# Patient Record
Sex: Female | Born: 1950 | Race: White | Hispanic: No | Marital: Married | Smoking: Former smoker
Health system: Southern US, Community
[De-identification: ages and names within clinical notes are randomized; demographics above are authoritative.]

## PROBLEM LIST (undated history)

## (undated) DIAGNOSIS — E213 Hyperparathyroidism, unspecified: Secondary | ICD-10-CM

## (undated) DIAGNOSIS — M1990 Unspecified osteoarthritis, unspecified site: Secondary | ICD-10-CM

## (undated) DIAGNOSIS — K219 Gastro-esophageal reflux disease without esophagitis: Secondary | ICD-10-CM

## (undated) DIAGNOSIS — I10 Essential (primary) hypertension: Secondary | ICD-10-CM

## (undated) DIAGNOSIS — R61 Generalized hyperhidrosis: Secondary | ICD-10-CM

## (undated) DIAGNOSIS — E119 Type 2 diabetes mellitus without complications: Secondary | ICD-10-CM

## (undated) DIAGNOSIS — Z8719 Personal history of other diseases of the digestive system: Secondary | ICD-10-CM

## (undated) DIAGNOSIS — C801 Malignant (primary) neoplasm, unspecified: Secondary | ICD-10-CM

## (undated) DIAGNOSIS — E7800 Pure hypercholesterolemia, unspecified: Secondary | ICD-10-CM

## (undated) DIAGNOSIS — N189 Chronic kidney disease, unspecified: Secondary | ICD-10-CM

## (undated) DIAGNOSIS — F32A Depression, unspecified: Secondary | ICD-10-CM

## (undated) DIAGNOSIS — F329 Major depressive disorder, single episode, unspecified: Secondary | ICD-10-CM

## (undated) HISTORY — DX: Depression, unspecified: F32.A

## (undated) HISTORY — PX: MELANOMA EXCISION: SHX5266

## (undated) HISTORY — PX: JOINT REPLACEMENT: SHX530

## (undated) HISTORY — DX: Generalized hyperhidrosis: R61

## (undated) HISTORY — DX: Gastro-esophageal reflux disease without esophagitis: K21.9

## (undated) HISTORY — DX: Hyperparathyroidism, unspecified: E21.3

## (undated) HISTORY — DX: Pure hypercholesterolemia, unspecified: E78.00

## (undated) HISTORY — DX: Type 2 diabetes mellitus without complications: E11.9

## (undated) HISTORY — PX: BREAST EXCISIONAL BIOPSY: SUR124

## (undated) HISTORY — DX: Essential (primary) hypertension: I10

## (undated) HISTORY — DX: Major depressive disorder, single episode, unspecified: F32.9

## (undated) HISTORY — DX: Malignant (primary) neoplasm, unspecified: C80.1

---
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
## 1975-05-10 HISTORY — PX: CARPAL TUNNEL RELEASE: SHX101

## 1977-05-09 HISTORY — PX: ABDOMINAL HYSTERECTOMY: SHX81

## 2005-05-09 HISTORY — PX: CARPAL TUNNEL RELEASE: SHX101

## 2006-05-09 HISTORY — PX: TOTAL KNEE ARTHROPLASTY: SHX125

## 2007-05-10 HISTORY — PX: COLONOSCOPY: SHX174

## 2007-05-10 LAB — HM COLONOSCOPY

## 2010-05-09 HISTORY — PX: TOTAL KNEE ARTHROPLASTY: SHX125

## 2010-05-09 LAB — HM MAMMOGRAPHY

## 2011-05-10 LAB — HM PAP SMEAR

## 2013-04-09 ENCOUNTER — Ambulatory Visit (INDEPENDENT_AMBULATORY_CARE_PROVIDER_SITE_OTHER): Payer: 59 | Admitting: Internal Medicine

## 2013-04-09 ENCOUNTER — Encounter: Payer: Self-pay | Admitting: Internal Medicine

## 2013-04-09 VITALS — BP 136/88 | HR 81 | Temp 98.3°F | Resp 12 | Ht 64.5 in | Wt 209.0 lb

## 2013-04-09 DIAGNOSIS — F418 Other specified anxiety disorders: Secondary | ICD-10-CM | POA: Insufficient documentation

## 2013-04-09 DIAGNOSIS — Z23 Encounter for immunization: Secondary | ICD-10-CM

## 2013-04-09 DIAGNOSIS — E119 Type 2 diabetes mellitus without complications: Secondary | ICD-10-CM | POA: Insufficient documentation

## 2013-04-09 DIAGNOSIS — E785 Hyperlipidemia, unspecified: Secondary | ICD-10-CM | POA: Insufficient documentation

## 2013-04-09 DIAGNOSIS — E1122 Type 2 diabetes mellitus with diabetic chronic kidney disease: Secondary | ICD-10-CM | POA: Insufficient documentation

## 2013-04-09 DIAGNOSIS — F329 Major depressive disorder, single episode, unspecified: Secondary | ICD-10-CM

## 2013-04-09 DIAGNOSIS — E559 Vitamin D deficiency, unspecified: Secondary | ICD-10-CM | POA: Insufficient documentation

## 2013-04-09 DIAGNOSIS — K219 Gastro-esophageal reflux disease without esophagitis: Secondary | ICD-10-CM

## 2013-04-09 MED ORDER — PAROXETINE HCL 30 MG PO TABS: 30.0000 mg | ORAL_TABLET | Freq: Every day | ORAL | Status: DC

## 2013-04-09 MED ORDER — SIMVASTATIN 20 MG PO TABS: 20.0000 mg | ORAL_TABLET | Freq: Every day | ORAL | Status: DC

## 2013-04-09 NOTE — Progress Notes (Signed)
Patient ID: Jill Garza, female   DOB: July 05, 1950, 62 y.o.   MRN: [id number]  Chief Complaint  Patient presents with  . Establish Care    New patient establis care, not fasting today   . Medication Refill    Renew medications    Allergies  Allergen Reactions  . Penicillins     Rash   . Ivp Dye [Iodinated Diagnostic Agents]     Hives   . Percocet [Oxycodone-Acetaminophen]     Itching     HPI 62 y/o female patient is here to establish care. She moved from [place] in July 2014. She has history of hypertension since 1997 and is tolerating hyzaar well for several years. She has DM since 2007 and has been on metformin 500 mg daily. a1c < 7 in may 2014 Has history of hiatal hernia and is on prilosec 20 mg daily. Has hx of gastric ulcers which were noted in EGD and have healed Has been on paxil 30 mg daily and her mood has been stable Has been tolerating zocor well uptodate with eye exam including retinal exam uptodate with influenza vaccine and herpes zoster vaccine Has not had bone scan and mammogram in past  Review of Systems  Constitutional: Negative for fever, chills, weight loss, malaise/fatigue and diaphoresis.  HENT: Negative for congestion, hearing loss and sore throat.   Eyes: Negative for blurred vision, double vision and discharge. wears corrective lenses Respiratory: Negative for cough, sputum production, shortness of breath and wheezing.   Cardiovascular: Negative for chest pain, palpitations, orthopnea and leg swelling.  Gastrointestinal: Negative for nausea, vomiting, abdominal pain, diarrhea and constipation.  Genitourinary: Negative for dysuria, urgency, frequency and flank pain.  Musculoskeletal: Negative for back pain, falls, joint pain and myalgias.  Skin: Negative for itching and rash.  Neurological: Negative for dizziness, tingling, focal weakness and headaches.  Psychiatric/Behavioral: Negative for depression and memory loss. The patient is not  nervous/anxious.    Past Medical History  Diagnosis Date  . Hypertension   . Type 2 diabetes mellitus   . Hypercholesterolemia   . GERD (gastroesophageal reflux disease)   . Depression    Past Surgical History  Procedure Laterality Date  . Total knee arthroplasty Left 2012  . Total knee arthroplasty Right 2008  . Abdominal hysterectomy  1979  . Appendectomy  1964  . Tonsillectomy  1962  . Carpal tunnel release Right 1977  . Carpal tunnel release Left 2007  . Colonoscopy  2009   Family History  Problem Relation Age of Onset  . Heart attack Father   . Pulmonary embolism Mother     Multiple, B/L   . Hypertension Brother   . Heart disease Brother   . Diabetes Brother   . High Cholesterol Brother   . Hypertension Brother   . High Cholesterol Brother   . Hypertension Sister   . Diabetes type II Sister   . High Cholesterol Sister   . Alcohol abuse Brother   . Hepatitis Brother   . Reye's syndrome Daughter   . Stroke    . Cancer Maternal Grandmother 66   History   Social History Narrative  . No narrative on file   Physical exam BP 136/88  Pulse 81  Temp(Src) 98.3 F (36.8 C) (Oral)  Resp 12  Ht 5' 4.5" (1.638 m)  Wt 209 lb (94.802 kg)  BMI 35.33 kg/m2  SpO2 98%  General- elderly female in no acute distress Head- atraumatic, normocephalic Eyes- PERRLA, EOMI, no pallor, no  icterus, no discharge Neck- no lymphadenopathy, no thyromegaly, no jugular vein distension, no carotid bruit Chest- no chest wall deformities, no chest wall tenderness Cardiovascular- normal s1,s2, no murmurs/ rubs/ gallops Respiratory- bilateral clear to auscultation, no wheeze, no rhonchi, no crackles Abdomen- bowel sounds present, soft, non tender Musculoskeletal- able to move all 4 extremities, no spinal and paraspinal tenderness, steady gait, no use of assistive device Neurological- no focal deficit, normal reflexes, normal muscle strength Psychiatry- alert and oriented to person, place  and time, normal mood and affect   Labs-  10/02/12 vit d 46, t.chol 200, tg 189, hdl 50, ldl 112, a1c 6.4, na 136, k 3.9, co2 27, cr 0.98, alb 4.3, t.bil 0.7, alp 60, ast 25, alt 21, gfr 57, wbc 6.4, hb 14, hct 43, mcv 87, plt 286, tsh 0.84  Assessment/plan  Dm type 2- will check her a1c. Continue metformin for now and check urine microalbumin. Continue  Losartan and zocor. uptodate with eye exam. counselling on dietary intake, meal planning- portion and calorie counting education along with exercise plan formulated for patient. Pneumococcal vaccine to be provided. uptodate with influenza vaccine  HTN- bp well controlled. Continue hyzaar for now, monitor bp , salt restriction  GERD- symptoms under control, continue her prilosec for now  Depression- mood stable on paxil, continue this and monitor  hyperlipidemia- continue zocor, dietary counselling provided  Vitamin d def- continue vitamin d supplement  Labs- a1c, urine microalbumin, mammogram, dexa scan

## 2013-04-10 ENCOUNTER — Encounter: Payer: Self-pay | Admitting: *Deleted

## 2013-04-10 LAB — MICROALBUMIN / CREATININE URINE RATIO
Creatinine, Ur: 101.8 mg/dL (ref 15.0–278.0)
MICROALB/CREAT RATIO: 5.1 mg/g creat (ref 0.0–30.0)

## 2013-04-10 LAB — HEMOGLOBIN A1C
Est. average glucose Bld gHb Est-mCnc: 140 mg/dL
Hgb A1c MFr Bld: 6.5 % — ABNORMAL HIGH (ref 4.8–5.6)

## 2013-04-23 ENCOUNTER — Other Ambulatory Visit: Payer: Self-pay | Admitting: *Deleted

## 2013-04-23 MED ORDER — LOSARTAN POTASSIUM-HCTZ 100-12.5 MG PO TABS: 1.0000 | ORAL_TABLET | Freq: Every day | ORAL | Status: DC

## 2013-05-16 ENCOUNTER — Ambulatory Visit
Admission: RE | Admit: 2013-05-16 | Discharge: 2013-05-16 | Disposition: A | Discharge status: Home / Self Care | Payer: 59 | Source: Ambulatory Visit | Attending: Internal Medicine | Admitting: Internal Medicine

## 2013-05-16 DIAGNOSIS — E559 Vitamin D deficiency, unspecified: Secondary | ICD-10-CM

## 2013-05-16 DIAGNOSIS — E119 Type 2 diabetes mellitus without complications: Secondary | ICD-10-CM

## 2013-05-21 ENCOUNTER — Other Ambulatory Visit: Payer: Self-pay | Admitting: Internal Medicine

## 2013-05-21 DIAGNOSIS — R928 Other abnormal and inconclusive findings on diagnostic imaging of breast: Secondary | ICD-10-CM

## 2013-05-24 ENCOUNTER — Other Ambulatory Visit: Payer: Self-pay | Admitting: Internal Medicine

## 2013-05-24 ENCOUNTER — Other Ambulatory Visit: Payer: Self-pay

## 2013-05-24 DIAGNOSIS — R928 Other abnormal and inconclusive findings on diagnostic imaging of breast: Secondary | ICD-10-CM

## 2013-05-30 ENCOUNTER — Ambulatory Visit
Admission: RE | Admit: 2013-05-30 | Discharge: 2013-05-30 | Disposition: A | Discharge status: Home / Self Care | Payer: 59 | Source: Ambulatory Visit | Attending: Internal Medicine | Admitting: Internal Medicine

## 2013-05-30 DIAGNOSIS — R928 Other abnormal and inconclusive findings on diagnostic imaging of breast: Secondary | ICD-10-CM

## 2013-07-09 ENCOUNTER — Ambulatory Visit: Payer: 59 | Admitting: Internal Medicine

## 2013-07-16 ENCOUNTER — Encounter: Payer: Self-pay | Admitting: Internal Medicine

## 2013-07-16 ENCOUNTER — Ambulatory Visit (INDEPENDENT_AMBULATORY_CARE_PROVIDER_SITE_OTHER): Payer: 59 | Admitting: Internal Medicine

## 2013-07-16 VITALS — BP 118/72 | HR 84 | Temp 97.9°F | Wt 213.4 lb

## 2013-07-16 DIAGNOSIS — F32A Depression, unspecified: Secondary | ICD-10-CM

## 2013-07-16 DIAGNOSIS — I10 Essential (primary) hypertension: Secondary | ICD-10-CM | POA: Insufficient documentation

## 2013-07-16 DIAGNOSIS — E559 Vitamin D deficiency, unspecified: Secondary | ICD-10-CM

## 2013-07-16 DIAGNOSIS — K219 Gastro-esophageal reflux disease without esophagitis: Secondary | ICD-10-CM

## 2013-07-16 DIAGNOSIS — F329 Major depressive disorder, single episode, unspecified: Secondary | ICD-10-CM

## 2013-07-16 DIAGNOSIS — E119 Type 2 diabetes mellitus without complications: Secondary | ICD-10-CM

## 2013-07-16 DIAGNOSIS — M899 Disorder of bone, unspecified: Secondary | ICD-10-CM

## 2013-07-16 DIAGNOSIS — M949 Disorder of cartilage, unspecified: Secondary | ICD-10-CM

## 2013-07-16 DIAGNOSIS — F3289 Other specified depressive episodes: Secondary | ICD-10-CM

## 2013-07-16 DIAGNOSIS — M8580 Other specified disorders of bone density and structure, unspecified site: Secondary | ICD-10-CM

## 2013-07-16 DIAGNOSIS — E785 Hyperlipidemia, unspecified: Secondary | ICD-10-CM

## 2013-07-16 MED ORDER — PAROXETINE HCL 30 MG PO TABS: 30.0000 mg | ORAL_TABLET | Freq: Every day | ORAL | Status: DC

## 2013-07-16 NOTE — Progress Notes (Signed)
Patient ID: Jill Garza, female   DOB: 1950/12/24, 62 y.o.   MRN: [id number]     Chief Complaint  Patient presents with  . Medical Managment of Chronic Issues    3 month f/u    Allergies  Allergen Reactions  . Penicillins     Rash   . Ivp Dye [Iodinated Diagnostic Agents]     Hives   . Percocet [Oxycodone-Acetaminophen]     Itching     HPI 63 y/o female patient is here for her follow up. She has history of HTN, DM, hyperlipidemia. Complaint with her medications. Has history of hiatal hernia and gastric ulcers and is on prilosec 20 mg daily. mood has been stable uptodate with eye exam including retinal exam uptodate with influenza vaccine and herpes zoster vaccine uptodate with mammogram: normal and dexa scan: osteopenia. Reviewed  Review of Systems  Constitutional: Negative for fever, chills, weight loss, malaise/fatigue and diaphoresis.  HENT: Negative for congestion, hearing loss and sore throat.   Eyes: Negative for blurred vision, double vision and discharge. wears corrective lenses Respiratory: Negative for cough, sputum production, shortness of breath and wheezing.   Cardiovascular: Negative for chest pain, palpitations, orthopnea and leg swelling.  Gastrointestinal: Negative for nausea, vomiting, abdominal pain, diarrhea and constipation.  Genitourinary: Negative for dysuria, urgency, frequency and flank pain.  Musculoskeletal: Negative for back pain, falls, joint pain and myalgias.  Skin: Negative for itching and rash.  Neurological: Negative for dizziness, tingling, focal weakness and headaches.  Psychiatric/Behavioral: Negative for depression and memory loss. The patient is not nervous/anxious.      Past Medical History  Diagnosis Date  . Hypertension   . Type 2 diabetes mellitus   . Hypercholesterolemia   . GERD (gastroesophageal reflux disease)   . Depression    Past Surgical History  Procedure Laterality Date  . Total knee arthroplasty Left 2012  . Total  knee arthroplasty Right 2008  . Abdominal hysterectomy  1979  . Appendectomy  1964  . Tonsillectomy  1962  . Carpal tunnel release Right 1977  . Carpal tunnel release Left 2007  . Colonoscopy  2009   Current Outpatient Prescriptions on File Prior to Visit  Medication Sig Dispense Refill  . Biotin 300 MCG TABS Take by mouth daily.      . Cholecalciferol (VITAMIN D-3) 1000 UNITS CAPS Take by mouth daily.      Marland Kitchen CLINDAMYCIN HCL PO Take 1 g by mouth. Prior to dental work      . cyclobenzaprine (FLEXERIL) 10 MG tablet Take 10 mg by mouth every 8 (eight) hours as needed for muscle spasms.      Marland Kitchen losartan-hydrochlorothiazide (HYZAAR) 100-12.5 MG per tablet Take 1 tablet by mouth daily.  90 tablet  1  . metFORMIN (GLUCOPHAGE) 500 MG tablet Take 500 mg by mouth daily with breakfast.      . Multiple Vitamin (MULTIVITAMIN) tablet Take 1 tablet by mouth daily.      Marland Kitchen omeprazole (PRILOSEC) 20 MG capsule Take 20 mg by mouth daily.      . simvastatin (ZOCOR) 20 MG tablet Take 1 tablet (20 mg total) by mouth daily.  90 tablet  3   No current facility-administered medications on file prior to visit.    Physical exam BP 118/72  Pulse 84  Temp(Src) 97.9 F (36.6 C) (Oral)  Wt 213 lb 6.4 oz (96.798 kg)  SpO2 97%  General- elderly female in no acute distress Head- atraumatic, normocephalic Eyes- PERRLA, EOMI, no pallor,  no icterus, no discharge Neck- no lymphadenopathy, no thyromegaly, no jugular vein distension, no carotid bruit Chest- no chest wall deformities, no chest wall tenderness Cardiovascular- normal s1,s2, no murmurs/ rubs/ gallops Respiratory- bilateral clear to auscultation, no wheeze, no rhonchi, no crackles Abdomen- bowel sounds present, soft, non tender Musculoskeletal- able to move all 4 extremities, no spinal and paraspinal tenderness, steady gait, no use of assistive device Neurological- no focal deficit, normal reflexes, normal muscle strength Psychiatry- alert and oriented to  person, place and time, normal mood and affect   Labs-   10/02/12 vit d 46, t.chol 200, tg 189, hdl 50, ldl 112, a1c 6.4, na 136, k 3.9, co2 27, cr 0.98, alb 4.3, t.bil 0.7, alp 60, ast 25, alt 21, gfr 57, wbc 6.4, hb 14, hct 43, mcv 87, plt 286, tsh 0.84 Lab Results  Component Value Date   HGBA1C 6.5* 04/09/2013   Normal urine microalbumin  Assessment/plan  1. Depression Stable. Continue paroxetine. Refill provided - PARoxetine (PAXIL) 30 MG tablet; Take 1 tablet (30 mg total) by mouth daily.  Dispense: 90 tablet; Refill: 1  2. DM type 2 goal A1C below 7.5 Continue metformin for now. uptodate with eye and foot exam. Normal urine microalbumin. Continue  Losartan and zocor. uptodate with influenza vaccine and pneumococcal vaccine - Hemoglobin A1c - Hemoglobin A1c; Future  3. Hyperlipidemia LDL goal < 100 Continue zocor - Lipid Panel; Future  4. Unspecified vitamin D deficiency continue vitamin d supplement  5. GERD (gastroesophageal reflux disease) symptoms under control, continue her prilosec for now  6. Osteopenia Reviewed dexa scan. Started on ca-vit d. Check vit d level. Encouraged weight bearing exercises - Vitamin D, 1,25-dihydroxy; Future  7. Essential hypertension, benign Continue hyzaar for now, monitor bp , salt restriction, dietary counselling and exercise encouraged - CMP; Future - CBC with Differential; Future

## 2013-07-17 LAB — HEMOGLOBIN A1C
Est. average glucose Bld gHb Est-mCnc: 160 mg/dL
Hgb A1c MFr Bld: 7.2 % — ABNORMAL HIGH (ref 4.8–5.6)

## 2013-10-22 ENCOUNTER — Encounter: Payer: Self-pay | Admitting: Internal Medicine

## 2013-10-22 ENCOUNTER — Ambulatory Visit (INDEPENDENT_AMBULATORY_CARE_PROVIDER_SITE_OTHER): Payer: 59 | Admitting: Internal Medicine

## 2013-10-22 VITALS — BP 110/80 | HR 74 | Temp 98.2°F | Resp 18 | Ht 64.5 in | Wt 208.2 lb

## 2013-10-22 DIAGNOSIS — D239 Other benign neoplasm of skin, unspecified: Secondary | ICD-10-CM

## 2013-10-22 DIAGNOSIS — Z85820 Personal history of malignant melanoma of skin: Secondary | ICD-10-CM

## 2013-10-22 DIAGNOSIS — D229 Melanocytic nevi, unspecified: Secondary | ICD-10-CM

## 2013-10-22 NOTE — Progress Notes (Signed)
Patient ID: Jill Garza, female   DOB: 04-28-1951, 63 y.o.   MRN: [id number]    Chief Complaint  Patient presents with  . Acute Visit    new moles   Allergies  Allergen Reactions  . Penicillins     Rash   . Ivp Dye [Iodinated Diagnostic Agents]     Hives   . Percocet [Oxycodone-Acetaminophen]     Itching     HPI 63 y/o pt here with her husband. She has history of melanoma and she has noticed a new mole with change in shape and texture and color over last week. She has also noticed another mole coming up. Denies any itching or pain. Uses sunscree and has been out in the sun lately  ROS Personal hx of melanoma which was removed from her leg  Past Medical History  Diagnosis Date  . Hypertension   . Type 2 diabetes mellitus   . Hypercholesterolemia   . GERD (gastroesophageal reflux disease)   . Depression    Medication reviewed. [facility]  Physical exam BP 110/80  Pulse 74  Temp(Src) 98.2 F (36.8 C) (Oral)  Resp 18  Ht 5' 4.5" (1.638 m)  Wt 208 lb 3.2 oz (94.439 kg)  BMI 35.20 kg/m2  SpO2 97%  General- elderly female in no acute distress Head- atraumatic, normocephalic Neck- no lymphadenopathy Cardiovascular- normal s1,s2, no murmurs/ rubs/ gallops Respiratory- bilateral clear to auscultation, no wheeze, no rhonchi, no crackles Skin- several red and brown and [place] mole on her back. 3 new red mole which has changed in size compared to the picture she has and one is bluish purple in color, increased in size and has irregular margin  Assessment/plan  1. History of malignant melanoma of skin See below - Ambulatory referral to Dermatology  2. Numerous moles With her history of melanoma, will refer her to dermatology to help assess further for need of tissue biopsy. Advised on having sunscreen when outdoor for now - Ambulatory referral to Dermatology

## 2013-10-29 ENCOUNTER — Other Ambulatory Visit: Payer: Self-pay | Admitting: Internal Medicine

## 2013-12-13 ENCOUNTER — Other Ambulatory Visit: Payer: 59

## 2013-12-13 DIAGNOSIS — E119 Type 2 diabetes mellitus without complications: Secondary | ICD-10-CM

## 2013-12-13 DIAGNOSIS — M8580 Other specified disorders of bone density and structure, unspecified site: Secondary | ICD-10-CM

## 2013-12-13 DIAGNOSIS — E785 Hyperlipidemia, unspecified: Secondary | ICD-10-CM

## 2013-12-13 DIAGNOSIS — I10 Essential (primary) hypertension: Secondary | ICD-10-CM

## 2013-12-16 LAB — CBC WITH DIFFERENTIAL/PLATELET
BASOS: 1 %
Basophils Absolute: 0 10*3/uL (ref 0.0–0.2)
EOS ABS: 0.1 10*3/uL (ref 0.0–0.4)
Eos: 1 %
HCT: 40.1 % (ref 34.0–46.6)
HEMOGLOBIN: 13.6 g/dL (ref 11.1–15.9)
IMMATURE GRANS (ABS): 0 10*3/uL (ref 0.0–0.1)
Immature Granulocytes: 0 %
LYMPHS: 46 %
Lymphocytes Absolute: 2.1 10*3/uL (ref 0.7–3.1)
MCH: 29.1 pg (ref 26.6–33.0)
MCHC: 33.9 g/dL (ref 31.5–35.7)
MCV: 86 fL (ref 79–97)
MONOS ABS: 0.3 10*3/uL (ref 0.1–0.9)
Monocytes: 6 %
NEUTROS ABS: 2.1 10*3/uL (ref 1.4–7.0)
Neutrophils Relative %: 46 %
RBC: 4.67 x10E6/uL (ref 3.77–5.28)
RDW: 12.9 % (ref 12.3–15.4)
WBC: 4.6 10*3/uL (ref 3.4–10.8)

## 2013-12-16 LAB — COMPREHENSIVE METABOLIC PANEL
A/G RATIO: 1.7 (ref 1.1–2.5)
ALK PHOS: 66 IU/L (ref 39–117)
ALT: 13 IU/L (ref 0–32)
AST: 17 IU/L (ref 0–40)
Albumin: 4.5 g/dL (ref 3.6–4.8)
BUN / CREAT RATIO: 17 (ref 11–26)
BUN: 16 mg/dL (ref 8–27)
CHLORIDE: 97 mmol/L (ref 97–108)
CO2: 25 mmol/L (ref 18–29)
Calcium: 10.3 mg/dL (ref 8.7–10.3)
Creatinine, Ser: 0.95 mg/dL (ref 0.57–1.00)
GFR calc Af Amer: 73 mL/min/{1.73_m2} (ref >59)
GFR calc non Af Amer: 63 mL/min/{1.73_m2} (ref >59)
Globulin, Total: 2.7 g/dL (ref 1.5–4.5)
Glucose: 105 mg/dL — ABNORMAL HIGH (ref 65–99)
Potassium: 4.2 mmol/L (ref 3.5–5.2)
SODIUM: 136 mmol/L (ref 134–144)
Total Bilirubin: 0.4 mg/dL (ref 0.0–1.2)
Total Protein: 7.2 g/dL (ref 6.0–8.5)

## 2013-12-16 LAB — LIPID PANEL
CHOLESTEROL TOTAL: 136 mg/dL (ref 100–199)
Chol/HDL Ratio: 3 ratio units (ref 0.0–4.4)
HDL: 46 mg/dL (ref >39)
LDL Calculated: 60 mg/dL (ref 0–99)
Triglycerides: 149 mg/dL (ref 0–149)
VLDL Cholesterol Cal: 30 mg/dL (ref 5–40)

## 2013-12-16 LAB — HEMOGLOBIN A1C
Est. average glucose Bld gHb Est-mCnc: 140 mg/dL
HEMOGLOBIN A1C: 6.5 % — AB (ref 4.8–5.6)

## 2013-12-16 LAB — VITAMIN D 1,25 DIHYDROXY: VIT D 1 25 DIHYDROXY: 48.2 pg/mL (ref 19.9–79.3)

## 2013-12-17 ENCOUNTER — Encounter: Payer: Self-pay | Admitting: Internal Medicine

## 2013-12-17 ENCOUNTER — Ambulatory Visit (INDEPENDENT_AMBULATORY_CARE_PROVIDER_SITE_OTHER): Payer: 59 | Admitting: Internal Medicine

## 2013-12-17 VITALS — BP 120/78 | HR 74 | Temp 98.0°F | Ht 64.5 in | Wt 207.8 lb

## 2013-12-17 DIAGNOSIS — F3289 Other specified depressive episodes: Secondary | ICD-10-CM

## 2013-12-17 DIAGNOSIS — K635 Polyp of colon: Secondary | ICD-10-CM

## 2013-12-17 DIAGNOSIS — F32A Depression, unspecified: Secondary | ICD-10-CM

## 2013-12-17 DIAGNOSIS — E559 Vitamin D deficiency, unspecified: Secondary | ICD-10-CM

## 2013-12-17 DIAGNOSIS — R079 Chest pain, unspecified: Secondary | ICD-10-CM | POA: Insufficient documentation

## 2013-12-17 DIAGNOSIS — K219 Gastro-esophageal reflux disease without esophagitis: Secondary | ICD-10-CM

## 2013-12-17 DIAGNOSIS — D126 Benign neoplasm of colon, unspecified: Secondary | ICD-10-CM

## 2013-12-17 DIAGNOSIS — Z23 Encounter for immunization: Secondary | ICD-10-CM

## 2013-12-17 DIAGNOSIS — F329 Major depressive disorder, single episode, unspecified: Secondary | ICD-10-CM

## 2013-12-17 DIAGNOSIS — I10 Essential (primary) hypertension: Secondary | ICD-10-CM

## 2013-12-17 DIAGNOSIS — M949 Disorder of cartilage, unspecified: Secondary | ICD-10-CM

## 2013-12-17 DIAGNOSIS — E785 Hyperlipidemia, unspecified: Secondary | ICD-10-CM

## 2013-12-17 DIAGNOSIS — Z0000 Encounter for general adult medical examination without abnormal findings: Secondary | ICD-10-CM | POA: Insufficient documentation

## 2013-12-17 DIAGNOSIS — M8580 Other specified disorders of bone density and structure, unspecified site: Secondary | ICD-10-CM

## 2013-12-17 DIAGNOSIS — M899 Disorder of bone, unspecified: Secondary | ICD-10-CM

## 2013-12-17 DIAGNOSIS — E119 Type 2 diabetes mellitus without complications: Secondary | ICD-10-CM

## 2013-12-17 LAB — SPECIMEN STATUS REPORT

## 2013-12-17 MED ORDER — ASPIRIN EC 81 MG PO TBEC: 81.0000 mg | DELAYED_RELEASE_TABLET | Freq: Every day | ORAL | Status: DC

## 2013-12-17 NOTE — Progress Notes (Signed)
Patient ID: Jill Garza, female   DOB: 11-12-1950, 63 y.o.   MRN: [id number]    Chief Complaint  Patient presents with  . Annual Exam    Physical with labs (printed)  . other    no refills needed   Allergies  Allergen Reactions  . Penicillins     Rash   . Ivp Dye [Iodinated Diagnostic Agents]     Hives   . Percocet [Oxycodone-Acetaminophen]     Itching    HPI  63 y/o female pt here for her annual exam. She has history of HTN, DM, HL among others. She has a sedentary lifestyle, does not exercise and denies any complaints this visit. 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 She feels hot and has been sweating more  Wt Readings from Last 3 Encounters:  12/17/13 207 lb 12.8 oz (94.257 kg)  10/22/13 208 lb 3.2 oz (94.439 kg)  07/16/13 213 lb 6.4 oz (96.798 kg)   Review of Systems  Constitutional: Negative for fever, chills, weight loss, malaise/fatigue  HENT: Negative for congestion, hearing loss and sore throat.   Eyes: Negative for blurred vision, double vision and discharge. uptodate with eye exam, next due in nov 2015 Respiratory: Negative for cough, sputum production, shortness of breath and wheezing.   Cardiovascular: Negative for palpitations, orthopnea and leg swelling. has been having chest pain lasting for few seconds on left side of her sternum with exertion mostly and occasionally with rest. Denies radiation of the pain and also mentions having chest tightness with these episodes. Gastrointestinal: Negative for heartburn, nausea, vomiting, abdominal pain, diarrhea and constipation. reflux under control. Had colonoscopy in 2008 and had polyps removed. Due for another one in 5 years but lost to follow up. Denies melena or rectal bleed Genitourinary: Negative for dysuria, urgency, frequency and flank pain. s/p hysterectomy Musculoskeletal: Negative for back pain, falls, joint pain and myalgias. s/p both  TKA Skin: Negative for itching and rash.  Neurological:  Negative for dizziness, tingling, focal weakness and headaches.  Psychiatric/Behavioral: Negative for memory loss. The patient is not nervous/anxious.  has history of depression and is on medication  Past Medical History  Diagnosis Date  . Hypertension   . Type 2 diabetes mellitus   . Hypercholesterolemia   . GERD (gastroesophageal reflux disease)   . Depression   . Sweat, sweating, excessive    Past Surgical History  Procedure Laterality Date  . Total knee arthroplasty Left 2012  . Total knee arthroplasty Right 2008  . Abdominal hysterectomy  1979  . Appendectomy  1964  . Tonsillectomy  1962  . Carpal tunnel release Right 1977  . Carpal tunnel release Left 2007  . Colonoscopy  2009   Current Outpatient Prescriptions on File Prior to Visit  Medication Sig Dispense Refill  . Biotin 300 MCG TABS Take by mouth daily.      . Calcium Carb-Cholecalciferol (CALCIUM 600 + D PO) Take 1 tablet by mouth 2 (two) times daily.      . Cholecalciferol (VITAMIN D-3) 1000 UNITS CAPS Take by mouth daily.      Marland Kitchen CLINDAMYCIN HCL PO Take 1 g by mouth. Prior to dental work      . cyclobenzaprine (FLEXERIL) 10 MG tablet Take 10 mg by mouth every 8 (eight) hours as needed for muscle spasms.      Marland Kitchen losartan-hydrochlorothiazide (HYZAAR) 100-12.5 MG per tablet TAKE 1 TABLET BY MOUTH DAILY.  [place]  tablet  1  . metFORMIN (GLUCOPHAGE) 500 MG tablet Take 500 mg by mouth daily with breakfast.      . Multiple Vitamin (MULTIVITAMIN) tablet Take 1 tablet by mouth daily.      Marland Kitchen omeprazole (PRILOSEC) 20 MG capsule Take 20 mg by mouth daily.      Marland Kitchen PARoxetine (PAXIL) 30 MG tablet Take 1 tablet (30 mg total) by mouth daily.  90 tablet  1  . simvastatin (ZOCOR) 20 MG tablet Take 1 tablet (20 mg total) by mouth daily.  90 tablet  3   No current facility-administered medications on file prior to visit.   Family History  Problem Relation Age of Onset  . Heart  attack Father   . Pulmonary embolism Mother     Multiple, B/L   . Hypertension Brother   . Heart disease Brother   . Diabetes Brother   . High Cholesterol Brother   . Hypertension Brother   . High Cholesterol Brother   . Hypertension Sister   . Diabetes type II Sister   . High Cholesterol Sister   . Alcohol abuse Brother   . Hepatitis Brother   . Reye's syndrome Daughter   . Stroke    . Cancer Maternal Grandmother 99   History   Social History  . Marital Status: Married    Spouse Name: N/A    Number of Children: N/A  . Years of Education: N/A   Occupational History  . Not on file.   Social History Main Topics  . Smoking status: Former Smoker -- 15 years    Quit date: 05/09/1986  . Smokeless tobacco: Not on file  . Alcohol Use: No  . Drug Use: No  . Sexual Activity: Not on file   Other Topics Concern  . Not on file   Social History Narrative  . No narrative on file   Physical exam BP 120/78  Pulse 74  Temp(Src) 98 F (36.7 C) (Oral)  Ht 5' 4.5" (1.638 m)  Wt 207 lb 12.8 oz (94.257 kg)  BMI 35.13 kg/m2  SpO2 98%  General- adult female in no acute distress, well nourished and well developed Head- atraumatic, normocephalic Eyes- PERRLA, EOMI, no pallor, no icterus, no discharge Neck- supple, no lymphadenopathy, no thyromegaly, no jugular vein distension, no carotid bruit Ears- left ear normal tympanic membrane and normal external ear canal , right ear normal tympanic membrane and normal external ear canal oropharynx- moist mucus membrane, normal oropharynx, normal dentition, no buccal or palatal lesions, posterior pharynx clear Nose- normal nasal mucosa, no maxillary or frontal sinus tenderness Chest- no chest wall deformities, no chest wall tenderness Breast- normal skin, nipples without discharge, no palpable mass or lesion, no axillary lymphadenoapthy Cardiovascular- normal s1,s2, no murmurs/ rubs/ gallops, good radial and dorsalis pedis  pulses Respiratory- bilateral clear to auscultation, no wheeze, no rhonchi, no crackles, no use of accessory muscles Abdomen- bowel sounds present, soft, non tender, no organomegaly, no abdominal bruits, no guarding or rigidity, no CVA tenderness Pelvic exam- not done, pt being referred to Gyn Musculoskeletal- able to move all 4 extremities, no spinal and paraspinal tenderness, steady gait, no use of assistive device, normal range of motion, no leg edema Neurological- no focal deficit, cranial nerve II-XII normal, normal motor strength, normal deep tendon reflexes 2+ and symmetrical biceps and patellar, normal sensation to fine touch and vibration, no tremor, no rigidity, alert and oriented to person, place and time Skin- warm and dry Psychiatry- normal mood and  affect  imaging 12/17/13 EKG- normal sinus rhythm, normal QTc, normal PR interval and QRS complex   Labs Lab Results  Component Value Date   HGBA1C 6.5* 12/13/2013    Lipid Panel     Component Value Date/Time   TRIG 149 12/13/2013 0835   HDL 46 12/13/2013 0835   CHOLHDL 3.0 12/13/2013 0835   LDLCALC 60 12/13/2013 0835   CBC    Component Value Date/Time   WBC 4.6 12/13/2013 0835   RBC 4.67 12/13/2013 0835   HGB 13.6 12/13/2013 0835   HCT 40.1 12/13/2013 0835   MCV 86 12/13/2013 0835   MCH 29.1 12/13/2013 0835   MCHC 33.9 12/13/2013 0835   RDW 12.9 12/13/2013 0835   LYMPHSABS 2.1 12/13/2013 0835   EOSABS 0.1 12/13/2013 0835   BASOSABS 0.0 12/13/2013 0835    CMP     Component Value Date/Time   NA 136 12/13/2013 0835   K 4.2 12/13/2013 0835   CL 97 12/13/2013 0835   CO2 25 12/13/2013 0835   GLUCOSE 105* 12/13/2013 0835   BUN 16 12/13/2013 0835   CREATININE 0.95 12/13/2013 0835   CALCIUM 10.3 12/13/2013 0835   PROT 7.2 12/13/2013 0835   AST 17 12/13/2013 0835   ALT 13 12/13/2013 0835   ALKPHOS 66 12/13/2013 0835   BILITOT 0.4 12/13/2013 0835   GFRNONAA 63 12/13/2013 0835   GFRAA 73 12/13/2013 0835   Assessment/plan  1. Routine general medical examination at a  health care facility the patient was counseled regarding the appropriate use of alcohol, regular self-examination of the breasts on a monthly basis, prevention of dental and periodontal disease, diet, regular sustained exercise for at least 30 minutes 5 times per week, routine screening interval for mammogram as recommended by the [place] and ACOG, the proper use of sunscreen and protective clothing, tobacco use, and recommended schedule for GI hemoccult testing, colonoscopy, cholesterol, thyroid and diabetes screening. - Ambulatory referral to Gynecology - EKG 12-Lead  2. Colon polyp Due for repeat colonoscopy - Ambulatory referral to Gastroenterology  3. Essential hypertension, benign Continue losartan-hctz current regimen and reviewed ekg - EKG 12-Lead  4. Gastroesophageal reflux disease, esophagitis presence not specified Stable on PPI, continue  5. DM type 2 goal A1C below 7.5 Continue metformin 500 mg daily with losartan and simvastatin, normal foot exam, uptodate with eye exam. Check urine for microalbumin. Continue asa - Microalbumin/Creatinine Ratio, Urine - CMP; Future - Hemoglobin A1c; Future  6. Osteopenia Continue ca-vitd and weight bearing exercises. dexa scan 1/15 osteopenia  7. Depression Stable with paxil for now  8. Hyperlipidemia with target LDL less than 100 Continue zocor 20 mg daily - Lipid Panel; Future - EKG 12-Lead  9. Unspecified vitamin D deficiency Continue vit d supplement with her osteopenia  10. Chest pain, unspecified chest pain type Mainly with exertion and new. Given her age, history of DM, HTN and HL obtained ekg which is WNL. Will get exercise stress test to assess further - Exercise tolerance test; Future - EKG 12-Lead  11. Need for prophylactic vaccination against Streptococcus pneumoniae (pneumococcus) prevnar 13 provided

## 2013-12-18 LAB — MICROALBUMIN / CREATININE URINE RATIO
CREATININE UR: 156.2 mg/dL (ref 15.0–278.0)
MICROALB/CREAT RATIO: 3.8 mg/g creat (ref 0.0–30.0)
Microalbumin, Urine: 5.9 ug/mL (ref 0.0–17.0)

## 2013-12-19 LAB — TSH: TSH: 0.803 u[IU]/mL (ref 0.450–4.500)

## 2013-12-19 LAB — SPECIMEN STATUS REPORT

## 2013-12-20 ENCOUNTER — Encounter: Payer: Self-pay | Admitting: *Deleted

## 2014-01-08 ENCOUNTER — Encounter: Payer: Self-pay | Admitting: *Deleted

## 2014-01-17 ENCOUNTER — Encounter: Payer: 59 | Admitting: Physician Assistant

## 2014-01-21 ENCOUNTER — Encounter: Payer: Self-pay | Admitting: Internal Medicine

## 2014-01-27 ENCOUNTER — Other Ambulatory Visit: Payer: Self-pay | Admitting: *Deleted

## 2014-01-27 MED ORDER — METFORMIN HCL 500 MG PO TABS: 500.0000 mg | ORAL_TABLET | Freq: Every day | ORAL | Status: DC

## 2014-01-27 NOTE — Telephone Encounter (Signed)
[facility] Requested

## 2014-02-11 ENCOUNTER — Ambulatory Visit (INDEPENDENT_AMBULATORY_CARE_PROVIDER_SITE_OTHER): Payer: 59 | Admitting: Physician Assistant

## 2014-02-11 DIAGNOSIS — R079 Chest pain, unspecified: Secondary | ICD-10-CM

## 2014-02-11 NOTE — Progress Notes (Signed)
Exercise Treadmill Test  Jill Garza is a 63 y.o. female with hx of DM2, HTN, HL, FHx of CAD referred by PCP for ETT due to recent hx of exertional chest pain with assoc L arm discomfort and dyspnea.  Symptoms resolve with rest.  Exam unremarkable.  ECG: NSR, no ST-T changes.  Pre-Exercise Testing Evaluation Rhythm: normal sinus  Rate: 82 bpm     Test  Exercise Tolerance Test Ordering MD: Fransico Him, MD  Interpreting MD: Richardson Dopp, PA-C  Unique Test No: 1  Treadmill:  1  Indication for ETT: chest pain - rule out ischemia  Contraindication to ETT: No   Stress Modality: exercise - treadmill  Cardiac Imaging Performed: non   Protocol: standard [place] - maximal  Max BP:  200/93  Max MPHR (bpm):  155 85% MPR (bpm):  132  MPHR obtained (bpm):  148 % MPHR obtained:  95  Reached 85% MPHR (min:sec):  3:30 Total Exercise Time (min-sec):  6:00  Workload in METS:  7.1 Borg Scale: 13  Reason ETT Terminated:  desired heart rate attained    ST Segment Analysis At Rest: normal ST segments - no evidence of significant ST depression With Exercise: non-specific ST changes  Other Information Arrhythmia:  No Angina during ETT:  absent (0) Quality of ETT:  diagnostic  ETT Interpretation:  normal - no evidence of ischemia by ST analysis  Comments: Fair exercise capacity. No chest pain. Normal BP response to exercise. No significant ST changes to suggest ischemia.   Recommendations: Given multiple risk factors for CAD, would strongly consider nuclear stress test if symptoms persist. FU with Blanchie Serve, MD as directed. Signed,  Richardson Dopp, PA-C   02/11/2014 11:23 AM

## 2014-02-24 LAB — HM DIABETES EYE EXAM

## 2014-03-13 ENCOUNTER — Encounter: Payer: Self-pay | Admitting: Internal Medicine

## 2014-03-21 ENCOUNTER — Encounter (HOSPITAL_BASED_OUTPATIENT_CLINIC_OR_DEPARTMENT_OTHER): Payer: Self-pay | Admitting: Emergency Medicine

## 2014-03-21 ENCOUNTER — Emergency Department (HOSPITAL_BASED_OUTPATIENT_CLINIC_OR_DEPARTMENT_OTHER): Payer: Medicare Other

## 2014-03-21 ENCOUNTER — Emergency Department (HOSPITAL_BASED_OUTPATIENT_CLINIC_OR_DEPARTMENT_OTHER)
Admission: EM | Admit: 2014-03-21 | Discharge: 2014-03-21 | Disposition: A | Discharge status: Home / Self Care | Payer: Medicare Other | Attending: Emergency Medicine | Admitting: Emergency Medicine

## 2014-03-21 DIAGNOSIS — Z7982 Long term (current) use of aspirin: Secondary | ICD-10-CM | POA: Insufficient documentation

## 2014-03-21 DIAGNOSIS — K219 Gastro-esophageal reflux disease without esophagitis: Secondary | ICD-10-CM | POA: Insufficient documentation

## 2014-03-21 DIAGNOSIS — E780 Pure hypercholesterolemia: Secondary | ICD-10-CM | POA: Diagnosis not present

## 2014-03-21 DIAGNOSIS — Z880 Allergy status to penicillin: Secondary | ICD-10-CM | POA: Insufficient documentation

## 2014-03-21 DIAGNOSIS — J329 Chronic sinusitis, unspecified: Secondary | ICD-10-CM | POA: Diagnosis not present

## 2014-03-21 DIAGNOSIS — F329 Major depressive disorder, single episode, unspecified: Secondary | ICD-10-CM | POA: Diagnosis not present

## 2014-03-21 DIAGNOSIS — R509 Fever, unspecified: Secondary | ICD-10-CM

## 2014-03-21 DIAGNOSIS — Z87891 Personal history of nicotine dependence: Secondary | ICD-10-CM | POA: Diagnosis not present

## 2014-03-21 DIAGNOSIS — Z79899 Other long term (current) drug therapy: Secondary | ICD-10-CM | POA: Diagnosis not present

## 2014-03-21 DIAGNOSIS — E119 Type 2 diabetes mellitus without complications: Secondary | ICD-10-CM | POA: Insufficient documentation

## 2014-03-21 DIAGNOSIS — J020 Streptococcal pharyngitis: Secondary | ICD-10-CM

## 2014-03-21 DIAGNOSIS — R05 Cough: Secondary | ICD-10-CM | POA: Diagnosis present

## 2014-03-21 DIAGNOSIS — I10 Essential (primary) hypertension: Secondary | ICD-10-CM | POA: Diagnosis not present

## 2014-03-21 DIAGNOSIS — J8410 Pulmonary fibrosis, unspecified: Secondary | ICD-10-CM | POA: Diagnosis not present

## 2014-03-21 LAB — RAPID STREP SCREEN (MED CTR MEBANE ONLY): STREPTOCOCCUS, GROUP A SCREEN (DIRECT): POSITIVE — AB

## 2014-03-21 MED ORDER — CLINDAMYCIN HCL 150 MG PO CAPS
300.0000 mg | ORAL_CAPSULE | Freq: Once | ORAL | Status: AC
  Administered 2014-03-21: 300 mg via ORAL
  Filled 2014-03-21: qty 2

## 2014-03-21 MED ORDER — GUAIFENESIN 100 MG/5ML PO SYRP: 100.0000 mg | ORAL_SOLUTION | ORAL | Status: DC | PRN

## 2014-03-21 MED ORDER — CLINDAMYCIN HCL 300 MG PO CAPS: 300.0000 mg | ORAL_CAPSULE | Freq: Three times a day (TID) | ORAL | Status: DC

## 2014-03-21 NOTE — ED Provider Notes (Signed)
CSN: [id number]     Arrival date & time 03/21/14  0605 History   First MD Initiated Contact with Patient 03/21/14 0701     Chief Complaint  Patient presents with  . Cough     (Consider location/radiation/quality/duration/timing/severity/associated sxs/prior Treatment) HPI Comments: Patient presents with 2 weeks of cough, congestion, sinus pressure.  No SOB or Chest pain.  Developed fever to 100.8 last night.  Denies head or neck pain.  No abdominal pain or vomiting.  No sick contacts.  Did receive flu shot. No leg pain or swelling.  Using dayquil without relief.  No exertional chest pain.  No back pain.  No focal weakness, numbness, tingling.  Also has rhinorrhea and sore throat, no pain with swallowing.   The history is provided by the patient.    Past Medical History  Diagnosis Date  . Hypertension   . Type 2 diabetes mellitus   . Hypercholesterolemia   . GERD (gastroesophageal reflux disease)   . Depression   . Sweat, sweating, excessive    Past Surgical History  Procedure Laterality Date  . Total knee arthroplasty Left 2012  . Total knee arthroplasty Right 2008  . Abdominal hysterectomy  1979  . Appendectomy  1964  . Tonsillectomy  1962  . Carpal tunnel release Right 1977  . Carpal tunnel release Left 2007  . Colonoscopy  2009   Family History  Problem Relation Age of Onset  . Heart attack Father   . Pulmonary embolism Mother     Multiple, B/L   . Hypertension Brother   . Heart disease Brother   . Diabetes Brother   . High Cholesterol Brother   . Hypertension Brother   . High Cholesterol Brother   . Hypertension Sister   . Diabetes type II Sister   . High Cholesterol Sister   . Alcohol abuse Brother   . Hepatitis Brother   . Reye's syndrome Daughter   . Stroke    . Cancer Maternal Grandmother 66   History  Substance Use Topics  . Smoking status: Former Smoker -- 15 years    Quit date: 05/09/1986  . Smokeless tobacco: Not on file  . Alcohol Use: No    OB History    No data available     Review of Systems  Constitutional: Positive for fever and fatigue.  HENT: Positive for congestion, dental problem, postnasal drip, rhinorrhea, sinus pressure and sore throat. Negative for trouble swallowing.   Respiratory: Positive for cough. Negative for shortness of breath.   Cardiovascular: Negative for chest pain.  Gastrointestinal: Negative for nausea, vomiting and abdominal pain.  Genitourinary: Negative for dysuria, vaginal bleeding and vaginal discharge.  Musculoskeletal: Positive for myalgias and arthralgias. Negative for neck pain and neck stiffness.  Neurological: Positive for weakness and headaches. Negative for dizziness and numbness.  A complete 10 system review of systems was obtained and all systems are negative except as noted in the HPI and PMH.      Allergies  Penicillins; Ivp dye; and Percocet  Home Medications   Prior to Admission medications   Medication Sig Start Date End Date Taking? Authorizing Provider  aspirin EC 81 MG tablet Take 1 tablet (81 mg total) by mouth daily. 12/17/13  Yes Blanchie Serve, MD  Biotin 300 MCG TABS Take by mouth daily.   Yes Historical Provider, MD  Calcium Carb-Cholecalciferol (CALCIUM 600 + D PO) Take 1 tablet by mouth 2 (two) times daily.   Yes Historical Provider, MD  Cholecalciferol (VITAMIN  D-3) 1000 UNITS CAPS Take by mouth daily.   Yes Historical Provider, MD  losartan-hydrochlorothiazide (HYZAAR) 100-12.5 MG per tablet TAKE 1 TABLET BY MOUTH DAILY. 10/29/13  Yes Mahima Bubba Camp, MD  metFORMIN (GLUCOPHAGE) 500 MG tablet Take 1 tablet (500 mg total) by mouth daily with breakfast. 01/27/14  Yes Tiffany L Reed, DO  omeprazole (PRILOSEC) 20 MG capsule Take 20 mg by mouth daily.   Yes Historical Provider, MD  PARoxetine (PAXIL) 30 MG tablet Take 1 tablet (30 mg total) by mouth daily. 07/16/13  Yes Mahima Bubba Camp, MD  simvastatin (ZOCOR) 20 MG tablet Take 1 tablet (20 mg total) by mouth daily.  04/09/13  Yes Mahima Bubba Camp, MD  clindamycin (CLEOCIN) 300 MG capsule Take 1 capsule (300 mg total) by mouth 3 (three) times daily. 03/21/14   Ezequiel Essex, MD  cyclobenzaprine (FLEXERIL) 10 MG tablet Take 10 mg by mouth every 8 (eight) hours as needed for muscle spasms.    Historical Provider, MD  guaifenesin (ROBITUSSIN) 100 MG/5ML syrup Take 5-10 mLs (100-200 mg total) by mouth every 4 (four) hours as needed for cough. 03/21/14   Ezequiel Essex, MD  Multiple Vitamin (MULTIVITAMIN) tablet Take 1 tablet by mouth daily.    Historical Provider, MD   BP 141/82 mmHg  Pulse 98  Temp(Src) 99.8 F (37.7 C) (Oral)  Resp 20  Ht 5\' 5"  (1.651 m)  Wt 198 lb (89.812 kg)  BMI 32.95 kg/m2  SpO2 100% Physical Exam  Constitutional: She is oriented to person, place, and time. She appears well-developed and well-nourished. No distress.  Hoarse voice  HENT:  Head: Normocephalic and atraumatic.  Mouth/Throat: Oropharynx is clear and moist. No oropharyngeal exudate.  Frontal sinus tenderness  Eyes: Conjunctivae and EOM are normal. Pupils are equal, round, and reactive to light.  Neck: Normal range of motion. Neck supple.  No meningismus.  Cardiovascular: Normal rate, regular rhythm, normal heart sounds and intact distal pulses.   No murmur heard. Pulmonary/Chest: Effort normal and breath sounds normal. No respiratory distress. She exhibits no tenderness.  Abdominal: Soft. There is no tenderness. There is no rebound and no guarding.  Musculoskeletal: Normal range of motion. She exhibits no edema or tenderness.  Neurological: She is alert and oriented to person, place, and time. No cranial nerve deficit. She exhibits normal muscle tone. Coordination normal.  No ataxia on finger to nose bilaterally. No pronator drift. 5/5 strength throughout. CN 2-12 intact. Negative Romberg. Equal grip strength. Sensation intact. Gait is normal.   Skin: Skin is warm.  Psychiatric: She has a normal mood and affect. Her  behavior is normal.  Nursing note and vitals reviewed.   ED Course  Procedures (including critical care time) Labs Review Labs Reviewed  RAPID STREP SCREEN - Abnormal; Notable for the following:    Streptococcus, Group A Screen (Direct) POSITIVE (*)    All other components within normal limits    Imaging Review Dg Chest 2 View  03/21/2014   CLINICAL DATA:  Cough and congestion for 2 weeks.  Fever today.  EXAM: CHEST  2 VIEW  COMPARISON:  None.  FINDINGS: A single punctate granuloma is seen in the right upper lobe. The lungs are otherwise clear. Heart size is normal. No pneumothorax or pleural effusion. No focal bony abnormality.  IMPRESSION: No acute disease.   Electronically Signed   By: Inge Rise M.D.   On: 03/21/2014 06:49     EKG Interpretation   Date/Time:  Friday March 21 2014 95:09:32 EST Ventricular  Rate:  93 PR Interval:  156 QRS Duration: 74 QT Interval:  350 QTC Calculation: 435 R Axis:   22 Text Interpretation:  Normal sinus rhythm Low voltage QRS Borderline ECG  No previous ECGs available Confirmed by Wyvonnia Dusky  MD, Quintessa Simmerman [phone]) on  03/21/2014 7:33:45 AM      MDM   Final diagnoses:  Sinusitis, unspecified chronicity, unspecified location  Strep pharyngitis   Two-week history of upper respiratory symptoms with cough, rhinorrhea, sore throat and congestion. No chest pain or shortness of breath. Fever 2 days.  Mild tachycardia on arrival. No hypoxia. Afebrile. Chest x-ray negative. Rapid strep is positive. Tachycardia has improved. No hypoxia. Doubt PE given patient's respiratory symptoms.  We'll treat with clindamycin given patient's penicillin allergy. She states macrolides do not work well for her. She declines any nasal steroids or cough suppressants.  Follow up with PCP this week.  Return to the ED with chest pain, shortness of breath, or any other concerns.  BP 141/82 mmHg  Pulse 98  Temp(Src) 99.8 F (37.7 C) (Oral)  Resp 20  Ht 5'  5" (1.651 m)  Wt 198 lb (89.812 kg)  BMI 32.95 kg/m2  SpO2 100%   Ezequiel Essex, MD 03/21/14 [phone]

## 2014-03-21 NOTE — Discharge Instructions (Signed)
Sinusitis Take the medications as prescribed as well as the decongestants and cough medicines as discussed. Follow up with your doctor. Return to the ED if you develop new or worsening symptoms. Sinusitis is redness, soreness, and inflammation of the paranasal sinuses. Paranasal sinuses are air pockets within the bones of your face (beneath the eyes, the middle of the forehead, or above the eyes). In healthy paranasal sinuses, mucus is able to drain out, and air is able to circulate through them by way of your nose. However, when your paranasal sinuses are inflamed, mucus and air can become trapped. This can allow bacteria and other germs to grow and cause infection. Sinusitis can develop quickly and last only a short time (acute) or continue over a long period (chronic). Sinusitis that lasts for more than 12 weeks is considered chronic.  CAUSES  Causes of sinusitis include:  Allergies.  Structural abnormalities, such as displacement of the cartilage that separates your nostrils (deviated septum), which can decrease the air flow through your nose and sinuses and affect sinus drainage.  Functional abnormalities, such as when the small hairs (cilia) that line your sinuses and help remove mucus do not work properly or are not present. SIGNS AND SYMPTOMS  Symptoms of acute and chronic sinusitis are the same. The primary symptoms are pain and pressure around the affected sinuses. Other symptoms include:  Upper toothache.  Earache.  Headache.  Bad breath.  Decreased sense of smell and taste.  A cough, which worsens when you are lying flat.  Fatigue.  Fever.  Thick drainage from your nose, which often is green and may contain pus (purulent).  Swelling and warmth over the affected sinuses. DIAGNOSIS  Your health care provider will perform a physical exam. During the exam, your health care provider may:  Look in your nose for signs of abnormal growths in your nostrils (nasal  polyps).  Tap over the affected sinus to check for signs of infection.  View the inside of your sinuses (endoscopy) using an imaging device that has a light attached (endoscope). If your health care provider suspects that you have chronic sinusitis, one or more of the following tests may be recommended:  Allergy tests.  Nasal culture. A sample of mucus is taken from your nose, sent to a lab, and screened for bacteria.  Nasal cytology. A sample of mucus is taken from your nose and examined by your health care provider to determine if your sinusitis is related to an allergy. TREATMENT  Most cases of acute sinusitis are related to a viral infection and will resolve on their own within 10 days. Sometimes medicines are prescribed to help relieve symptoms (pain medicine, decongestants, nasal steroid sprays, or saline sprays).  However, for sinusitis related to a bacterial infection, your health care provider will prescribe antibiotic medicines. These are medicines that will help kill the bacteria causing the infection.  Rarely, sinusitis is caused by a fungal infection. In theses cases, your health care provider will prescribe antifungal medicine. For some cases of chronic sinusitis, surgery is needed. Generally, these are cases in which sinusitis recurs more than 3 times per year, despite other treatments. HOME CARE INSTRUCTIONS   Drink plenty of water. Water helps thin the mucus so your sinuses can drain more easily.  Use a humidifier.  Inhale steam 3 to 4 times a day (for example, sit in the bathroom with the shower running).  Apply a warm, moist washcloth to your face 3 to 4 times a day, or  as directed by your health care provider.  Use saline nasal sprays to help moisten and clean your sinuses.  Take medicines only as directed by your health care provider.  If you were prescribed either an antibiotic or antifungal medicine, finish it all even if you start to feel better. SEEK IMMEDIATE  MEDICAL CARE IF:  You have increasing pain or severe headaches.  You have nausea, vomiting, or drowsiness.  You have swelling around your face.  You have vision problems.  You have a stiff neck.  You have difficulty breathing. MAKE SURE YOU:   Understand these instructions.  Will watch your condition.  Will get help right away if you are not doing well or get worse. Document Released: 04/25/2005 Document Revised: 09/09/2013 Document Reviewed: 05/10/2011 [facility] Patient Information 2015 [place], [place]. This information is not intended to replace advice given to you by your health care provider. Make sure you discuss any questions you have with your health care provider.  Strep Throat Strep throat is an infection of the throat caused by a bacteria named Streptococcus pyogenes. Your health care provider may call the infection streptococcal "tonsillitis" or "pharyngitis" depending on whether there are signs of inflammation in the tonsils or back of the throat. Strep throat is most common in children aged 5-15 years during the cold months of the year, but it can occur in people of any age during any season. This infection is spread from person to person (contagious) through coughing, sneezing, or other close contact. SIGNS AND SYMPTOMS   Fever or chills.  Painful, swollen, red tonsils or throat.  Pain or difficulty when swallowing.  White or yellow spots on the tonsils or throat.  Swollen, tender lymph nodes or "glands" of the neck or under the jaw.  Red rash all over the body (rare). DIAGNOSIS  Many different infections can cause the same symptoms. A test must be done to confirm the diagnosis so the right treatment can be given. A "rapid strep test" can help your health care provider make the diagnosis in a few minutes. If this test is not available, a light swab of the infected area can be used for a throat culture test. If a throat culture test is done, results are usually  available in a day or two. TREATMENT  Strep throat is treated with antibiotic medicine. HOME CARE INSTRUCTIONS   Gargle with 1 tsp of salt in 1 cup of warm water, 3-4 times per day or as needed for comfort.  Family members who also have a sore throat or fever should be tested for strep throat and treated with antibiotics if they have the strep infection.  Make sure everyone in your household washes their hands well.  Do not share food, drinking cups, or personal items that could cause the infection to spread to others.  You may need to eat a soft food diet until your sore throat gets better.  Drink enough water and fluids to keep your urine clear or pale yellow. This will help prevent dehydration.  Get plenty of rest.  Stay home from school, day care, or work until you have been on antibiotics for 24 hours.  Take medicines only as directed by your health care provider.  Take your antibiotic medicine as directed by your health care provider. Finish it even if you start to feel better. SEEK MEDICAL CARE IF:   The glands in your neck continue to enlarge.  You develop a rash, cough, or earache.  You cough up  green, yellow-brown, or bloody sputum.  You have pain or discomfort not controlled by medicines.  Your problems seem to be getting worse rather than better.  You have a fever. SEEK IMMEDIATE MEDICAL CARE IF:   You develop any new symptoms such as vomiting, severe headache, stiff or painful neck, chest pain, shortness of breath, or trouble swallowing.  You develop severe throat pain, drooling, or changes in your voice.  You develop swelling of the neck, or the skin on the neck becomes red and tender.  You develop signs of dehydration, such as fatigue, dry mouth, and decreased urination.  You become increasingly sleepy, or you cannot wake up completely. MAKE SURE YOU:  Understand these instructions.  Will watch your condition.  Will get help right away if you are  not doing well or get worse. Document Released: 04/22/2000 Document Revised: 09/09/2013 Document Reviewed: 06/24/2010 [facility] Patient Information 2015 [place], [place]. This information is not intended to replace advice given to you by your health care provider. Make sure you discuss any questions you have with your health care provider.

## 2014-03-21 NOTE — ED Notes (Signed)
Pt reports that she has had cough, cold congestion, teeth pain x 2 weeks, started running a fever last pm, took tylenol this am at 0530

## 2014-03-24 DIAGNOSIS — H5213 Myopia, bilateral: Secondary | ICD-10-CM | POA: Diagnosis not present

## 2014-03-24 DIAGNOSIS — H2511 Age-related nuclear cataract, right eye: Secondary | ICD-10-CM | POA: Diagnosis not present

## 2014-03-24 DIAGNOSIS — H25012 Cortical age-related cataract, left eye: Secondary | ICD-10-CM | POA: Diagnosis not present

## 2014-03-24 DIAGNOSIS — E119 Type 2 diabetes mellitus without complications: Secondary | ICD-10-CM | POA: Diagnosis not present

## 2014-03-24 DIAGNOSIS — H2512 Age-related nuclear cataract, left eye: Secondary | ICD-10-CM | POA: Diagnosis not present

## 2014-03-25 ENCOUNTER — Encounter: Payer: Self-pay | Admitting: Internal Medicine

## 2014-03-31 ENCOUNTER — Encounter: Payer: Self-pay | Admitting: Internal Medicine

## 2014-04-01 NOTE — Telephone Encounter (Signed)
Pt called need Rx called in a [place] on [address].  Omeprazale 20 mg- take once daily. Pt says she called her old pharmacy. They would not accept the transfer.  [facility]

## 2014-04-02 ENCOUNTER — Other Ambulatory Visit: Payer: Self-pay

## 2014-04-02 DIAGNOSIS — F32A Depression, unspecified: Secondary | ICD-10-CM

## 2014-04-02 DIAGNOSIS — F329 Major depressive disorder, single episode, unspecified: Secondary | ICD-10-CM

## 2014-04-02 MED ORDER — SIMVASTATIN 20 MG PO TABS: 20.0000 mg | ORAL_TABLET | Freq: Every day | ORAL | Status: DC

## 2014-04-02 MED ORDER — OMEPRAZOLE 20 MG PO CPDR: 20.0000 mg | DELAYED_RELEASE_CAPSULE | Freq: Every day | ORAL | Status: DC

## 2014-04-02 MED ORDER — METFORMIN HCL 500 MG PO TABS: 500.0000 mg | ORAL_TABLET | Freq: Every day | ORAL | Status: DC

## 2014-04-02 MED ORDER — LOSARTAN POTASSIUM-HCTZ 100-12.5 MG PO TABS: 1.0000 | ORAL_TABLET | Freq: Every day | ORAL | Status: DC

## 2014-04-02 MED ORDER — PAROXETINE HCL 30 MG PO TABS: 30.0000 mg | ORAL_TABLET | Freq: Every day | ORAL | Status: DC

## 2014-04-02 NOTE — Telephone Encounter (Signed)
IA'X sent electronically to new pharmacy- [facility] on [address]

## 2014-04-13 HISTORY — PX: CATARACT EXTRACTION: SUR2

## 2014-04-22 DIAGNOSIS — H2512 Age-related nuclear cataract, left eye: Secondary | ICD-10-CM | POA: Diagnosis not present

## 2014-04-23 ENCOUNTER — Telehealth: Payer: Self-pay | Admitting: *Deleted

## 2014-04-23 NOTE — Telephone Encounter (Signed)
Spoke with patient regarding medical clearance from [facility], patient states that she had cataract surgery  04/22/2014 and was unaware that she need a clearance.

## 2014-05-21 DIAGNOSIS — H25011 Cortical age-related cataract, right eye: Secondary | ICD-10-CM | POA: Diagnosis not present

## 2014-05-21 DIAGNOSIS — H2511 Age-related nuclear cataract, right eye: Secondary | ICD-10-CM | POA: Diagnosis not present

## 2014-06-10 DIAGNOSIS — H2511 Age-related nuclear cataract, right eye: Secondary | ICD-10-CM | POA: Diagnosis not present

## 2014-06-10 HISTORY — PX: CATARACT EXTRACTION: SUR2

## 2014-06-19 ENCOUNTER — Other Ambulatory Visit: Payer: Medicare Other

## 2014-06-19 DIAGNOSIS — E785 Hyperlipidemia, unspecified: Secondary | ICD-10-CM

## 2014-06-19 DIAGNOSIS — E119 Type 2 diabetes mellitus without complications: Secondary | ICD-10-CM | POA: Diagnosis not present

## 2014-06-20 LAB — COMPREHENSIVE METABOLIC PANEL
ALBUMIN: 4.3 g/dL (ref 3.6–4.8)
ALT: 19 IU/L (ref 0–32)
AST: 23 IU/L (ref 0–40)
Albumin/Globulin Ratio: 1.6 (ref 1.1–2.5)
Alkaline Phosphatase: 64 IU/L (ref 39–117)
BUN/Creatinine Ratio: 12 (ref 11–26)
BUN: 12 mg/dL (ref 8–27)
Bilirubin Total: 0.4 mg/dL (ref 0.0–1.2)
CHLORIDE: 101 mmol/L (ref 97–108)
CO2: 24 mmol/L (ref 18–29)
CREATININE: 1 mg/dL (ref 0.57–1.00)
Calcium: 10.2 mg/dL (ref 8.7–10.3)
GFR calc non Af Amer: 59 mL/min/{1.73_m2} — ABNORMAL LOW (ref >59)
GFR, EST AFRICAN AMERICAN: 68 mL/min/{1.73_m2} (ref >59)
GLOBULIN, TOTAL: 2.7 g/dL (ref 1.5–4.5)
GLUCOSE: 122 mg/dL — AB (ref 65–99)
Potassium: 4.2 mmol/L (ref 3.5–5.2)
Sodium: 139 mmol/L (ref 134–144)
Total Protein: 7 g/dL (ref 6.0–8.5)

## 2014-06-20 LAB — HEMOGLOBIN A1C
Est. average glucose Bld gHb Est-mCnc: 140 mg/dL
Hgb A1c MFr Bld: 6.5 % — ABNORMAL HIGH (ref 4.8–5.6)

## 2014-06-20 LAB — LIPID PANEL
CHOLESTEROL TOTAL: 131 mg/dL (ref 100–199)
Chol/HDL Ratio: 2.5 ratio units (ref 0.0–4.4)
HDL: 52 mg/dL (ref >39)
LDL Calculated: 56 mg/dL (ref 0–99)
Triglycerides: 113 mg/dL (ref 0–149)
VLDL Cholesterol Cal: 23 mg/dL (ref 5–40)

## 2014-06-24 ENCOUNTER — Ambulatory Visit (INDEPENDENT_AMBULATORY_CARE_PROVIDER_SITE_OTHER): Payer: Medicare Other | Admitting: Internal Medicine

## 2014-06-24 ENCOUNTER — Encounter: Payer: Self-pay | Admitting: Internal Medicine

## 2014-06-24 VITALS — BP 110/68 | HR 87 | Temp 97.5°F | Resp 10 | Ht 65.0 in | Wt 223.0 lb

## 2014-06-24 DIAGNOSIS — K635 Polyp of colon: Secondary | ICD-10-CM

## 2014-06-24 DIAGNOSIS — I10 Essential (primary) hypertension: Secondary | ICD-10-CM

## 2014-06-24 DIAGNOSIS — Z9289 Personal history of other medical treatment: Secondary | ICD-10-CM | POA: Diagnosis not present

## 2014-06-24 DIAGNOSIS — K219 Gastro-esophageal reflux disease without esophagitis: Secondary | ICD-10-CM | POA: Diagnosis not present

## 2014-06-24 DIAGNOSIS — K449 Diaphragmatic hernia without obstruction or gangrene: Secondary | ICD-10-CM

## 2014-06-24 DIAGNOSIS — E669 Obesity, unspecified: Secondary | ICD-10-CM | POA: Diagnosis not present

## 2014-06-24 DIAGNOSIS — IMO0001 Reserved for inherently not codable concepts without codable children: Secondary | ICD-10-CM | POA: Insufficient documentation

## 2014-06-24 DIAGNOSIS — Z1231 Encounter for screening mammogram for malignant neoplasm of breast: Secondary | ICD-10-CM

## 2014-06-24 DIAGNOSIS — E785 Hyperlipidemia, unspecified: Secondary | ICD-10-CM

## 2014-06-24 DIAGNOSIS — E119 Type 2 diabetes mellitus without complications: Secondary | ICD-10-CM | POA: Diagnosis not present

## 2014-06-24 DIAGNOSIS — M8580 Other specified disorders of bone density and structure, unspecified site: Secondary | ICD-10-CM | POA: Diagnosis not present

## 2014-06-24 MED ORDER — SIMVASTATIN 10 MG PO TABS: 20.0000 mg | ORAL_TABLET | Freq: Every day | ORAL | Status: DC

## 2014-06-24 MED ORDER — METFORMIN HCL 500 MG PO TABS: 500.0000 mg | ORAL_TABLET | Freq: Every day | ORAL | Status: DC

## 2014-06-24 NOTE — Progress Notes (Signed)
Patient ID: Jill Garza, female   DOB: 1951/01/07, 64 y.o.   MRN: [id number]    Chief Complaint  Patient presents with  . Medical Management of Chronic Issues    6 month follow-up, discuss labs (copy printed)   Allergies  Allergen Reactions  . Penicillins     Rash   . Ivp Dye [Iodinated Diagnostic Agents]     Hives   . Percocet [Oxycodone-Acetaminophen]     Itching    HPI 64 y/o female pt here for her routine visit.  Reviewed her medications- has been compliant Has gained 25 lbs since last visit. Has sedentary lifestyle. Has retired, has not been exercising, tries to eat healthy dexa 05/16/13 reviewed PPI helping with her reflux symptoms Has not required flexeril recently Mood stable with paxil Due for repeat colonoscopy and mammogram Labs reviewed   ROS Constitutional: Negative for fever, chills. Has gained 25 lbs. Denies malaise/fatigue   HENT: Negative for congestion, hearing loss and sore throat.  had strept throat in November 2015 Eyes: Negative for blurred vision, double vision and discharge. Cataract surgery left Apr 13 2014, right feb 2016. uptodate with eye exam Respiratory: Negative for cough, sputum production, shortness of breath and wheezing.   Cardiovascular: Negative for chest pain, palpitations, orthopnea and leg swelling.  Gastrointestinal: Negative for heartburn, nausea, vomiting, abdominal pain, diarrhea and constipation. reflux under control. Had colonoscopy in 2009 and had polyps removed. Due for another one in 5 years but lost to follow up. Denies melena or rectal bleed.  Genitourinary: Negative for dysuria, urgency, frequency and flank pain. s/p hysterectomy Musculoskeletal: Negative for back pain, falls, joint pain and myalgias. s/p both TKA Skin: Negative for itching and rash.  Neurological:  Negative for dizziness, tingling, focal weakness and headaches.  Psychiatric/Behavioral: Negative for memory loss. The patient is not nervous/anxious.  has history  of depression and is on medication   Wt Readings from Last 3 Encounters:  06/24/14 223 lb (101.152 kg)  03/21/14 198 lb (89.812 kg)  12/17/13 207 lb 12.8 oz (94.257 kg)   Past Medical History  Diagnosis Date  . Hypertension   . Type 2 diabetes mellitus   . Hypercholesterolemia   . GERD (gastroesophageal reflux disease)   . Depression   . Sweat, sweating, excessive    Current Outpatient Prescriptions on File Prior to Visit  Medication Sig Dispense Refill  . aspirin EC 81 MG tablet Take 1 tablet (81 mg total) by mouth daily. 30 tablet 6  . Biotin 300 MCG TABS Take by mouth daily.    . Calcium Carb-Cholecalciferol (CALCIUM 600 + D PO) Take 1 tablet by mouth 2 (two) times daily.    . Cholecalciferol (VITAMIN D-3) 1000 UNITS CAPS Take by mouth daily.    . clindamycin (CLEOCIN) 300 MG capsule Take 1 capsule (300 mg total) by mouth 3 (three) times daily. 30 capsule 0  . guaifenesin (ROBITUSSIN) 100 MG/5ML syrup Take 5-10 mLs (100-200 mg total) by mouth every 4 (four) hours as needed for cough. 60 mL 0  . losartan-hydrochlorothiazide (HYZAAR) 100-12.5 MG per tablet Take 1 tablet by mouth daily. 90 tablet 1  . Multiple Vitamin (MULTIVITAMIN) tablet Take 1 tablet by mouth daily.    Marland Kitchen omeprazole (PRILOSEC) 20 MG capsule Take 1 capsule (20 mg total) by mouth daily. 90 capsule 1  . PARoxetine (PAXIL) 30 MG tablet Take 1 tablet (30 mg total) by mouth daily. 90 tablet 1   No current facility-administered medications on file prior to  visit.   Past Surgical History  Procedure Laterality Date  . Total knee arthroplasty Left 2012  . Total knee arthroplasty Right 2008  . Abdominal hysterectomy  1979  . Appendectomy  1964  . Tonsillectomy  1962  . Carpal tunnel release Right 1977  . Carpal tunnel release Left 2007  . Colonoscopy  2009  . Cataract extraction Left 04/13/2014    Dr. Herbert Deaner  . Cataract extraction Right 06/10/2014    Dr. Herbert Deaner   Physical exam BP 110/68 mmHg  Pulse 87  Temp(Src)  97.5 F (36.4 C) (Oral)  Resp 10  Ht 5\' 5"  (1.651 m)  Wt 223 lb (101.152 kg)  BMI 37.11 kg/m2  SpO2 93%  General- adult obese female in no acute distress Head- atraumatic, normocephalic Eyes- PERRLA, EOMI, no pallor, no icterus, no discharge Neck- supple, no lymphadenopathy, no thyromegaly Cardiovascular- normal s1,s2, no murmurs/ rubs/ gallops Respiratory- bilateral clear to auscultation, no wheeze, no rhonchi, no crackles, no use of accessory muscles Abdomen- soft, non tender Musculoskeletal- able to move all 4 extremities, normal range of motion, no leg edema Neurological- no focal deficit Skin- warm and dry Psychiatry- normal mood and affect  Labs-  CBC Latest Ref Rng 12/13/2013  WBC 3.4 - 10.8 x10E3/uL 4.6  Hemoglobin 11.1 - 15.9 g/dL 13.6  Hematocrit 34.0 - 46.6 % 40.1   CMP Latest Ref Rng 06/19/2014 12/13/2013  Glucose 65 - 99 mg/dL 122(H) 105(H)  BUN 8 - 27 mg/dL 12 16  Creatinine 0.57 - 1.00 mg/dL 1.00 0.95  Sodium 134 - 144 mmol/L 139 136  Potassium 3.5 - 5.2 mmol/L 4.2 4.2  Chloride 97 - 108 mmol/L 101 97  CO2 18 - 29 mmol/L 24 25  Calcium 8.7 - 10.3 mg/dL 10.2 10.3  Total Protein 6.0 - 8.5 g/dL 7.0 7.2  Albumin 3.6 - 4.8 g/dL 4.3 4.5  Total Bilirubin 0.0 - 1.2 mg/dL 0.4 0.4  Alkaline Phos 39 - 117 IU/L 64 66  AST 0 - 40 IU/L 23 17  ALT 0 - 32 IU/L 19 13   Lab Results  Component Value Date   HGBA1C 6.5* 06/19/2014   Lipid Panel     Component Value Date/Time   TRIG 113 06/19/2014 0818   HDL 52 06/19/2014 0818   CHOLHDL 2.5 06/19/2014 0818   LDLCALC 56 06/19/2014 0818   Assessment/plan  1. DM type 2 goal A1C below 7.5 a1c at goal. Continue metformin 500 mg daily. No microalbuminuria. Continue aspirin and statin. Diabetic education provided about diet, exercise, med compliance and weight loss. uptodate on eye and foot exam - metFORMIN (GLUCOPHAGE) 500 MG tablet; Take 1 tablet (500 mg total) by mouth daily with breakfast.  Dispense: 90 tablet; Refill: 3 -  TSH; Future - Hemoglobin A1c; Future  2. Colon polyp Due for repeat colonoscopy - Ambulatory referral to Gastroenterology  3. Essential hypertension, benign Stable bp reading, stable renal function, continue hyzaar, monitor - CMP; Future - Lipid Panel; Future - CBC with Differential; Future  4. Osteopenia Encouraged weight bearing exercise. Continue ca-vit d supplement. On ppi which increases risk for further bone loss. dexa 1/15 t score -2.4. Reassess in 1/17  5. Hyperlipidemia with target LDL less than 100 Improved lipid panel with ldl at goal. Decrease zocor to 10 mg daily. - simvastatin (ZOCOR) 10 MG tablet; Take 2 tablets (20 mg total) by mouth daily.  Dispense: 90 tablet; Refill: 3 - Lipid Panel; Future  6. Hiatal hernia with gastroesophageal reflux prilosec has helped  with her symptoms. Weight loss encouraged - Ambulatory referral to Gastroenterology  7. History of screening mammography Due for repeat mammogram. - MM Digital Screening; Future  8. Obesity Reviewed BMI. Dietary and exercise counselling provided. Pt refuses referral to dietary clinic and mentions she will start exercising 30 min at least 5 days a week  9. Screening mammogram for high-risk patient See above

## 2014-07-01 ENCOUNTER — Encounter: Payer: Self-pay | Admitting: Internal Medicine

## 2014-07-11 ENCOUNTER — Other Ambulatory Visit: Payer: Self-pay | Admitting: Internal Medicine

## 2014-07-11 DIAGNOSIS — Z1231 Encounter for screening mammogram for malignant neoplasm of breast: Secondary | ICD-10-CM

## 2014-07-16 ENCOUNTER — Ambulatory Visit
Admission: RE | Admit: 2014-07-16 | Discharge: 2014-07-16 | Disposition: A | Discharge status: Home / Self Care | Payer: Medicare Other | Source: Ambulatory Visit | Attending: Internal Medicine | Admitting: Internal Medicine

## 2014-07-16 DIAGNOSIS — Z1231 Encounter for screening mammogram for malignant neoplasm of breast: Secondary | ICD-10-CM

## 2014-07-29 ENCOUNTER — Other Ambulatory Visit: Payer: Self-pay | Admitting: Internal Medicine

## 2014-07-29 DIAGNOSIS — R928 Other abnormal and inconclusive findings on diagnostic imaging of breast: Secondary | ICD-10-CM

## 2014-07-30 ENCOUNTER — Ambulatory Visit
Admission: RE | Admit: 2014-07-30 | Discharge: 2014-07-30 | Disposition: A | Discharge status: Home / Self Care | Payer: Medicare Other | Source: Ambulatory Visit | Attending: Internal Medicine | Admitting: Internal Medicine

## 2014-07-30 DIAGNOSIS — R928 Other abnormal and inconclusive findings on diagnostic imaging of breast: Secondary | ICD-10-CM

## 2014-07-30 DIAGNOSIS — N6489 Other specified disorders of breast: Secondary | ICD-10-CM | POA: Diagnosis not present

## 2014-08-08 HISTORY — PX: BREAST EXCISIONAL BIOPSY: SUR124

## 2014-08-11 ENCOUNTER — Other Ambulatory Visit: Payer: Self-pay | Admitting: Surgery

## 2014-08-11 DIAGNOSIS — R928 Other abnormal and inconclusive findings on diagnostic imaging of breast: Secondary | ICD-10-CM | POA: Diagnosis not present

## 2014-08-12 ENCOUNTER — Other Ambulatory Visit: Payer: Self-pay | Admitting: *Deleted

## 2014-08-12 DIAGNOSIS — F32A Depression, unspecified: Secondary | ICD-10-CM

## 2014-08-12 DIAGNOSIS — F329 Major depressive disorder, single episode, unspecified: Secondary | ICD-10-CM

## 2014-08-12 MED ORDER — PAROXETINE HCL 30 MG PO TABS: 30.0000 mg | ORAL_TABLET | Freq: Every day | ORAL | Status: DC

## 2014-08-12 NOTE — Telephone Encounter (Signed)
[facility] [address] 

## 2014-08-21 ENCOUNTER — Ambulatory Visit
Admission: RE | Admit: 2014-08-21 | Discharge: 2014-08-21 | Disposition: A | Discharge status: Home / Self Care | Payer: Medicare Other | Source: Ambulatory Visit | Attending: Surgery | Admitting: Surgery

## 2014-08-21 DIAGNOSIS — N6489 Other specified disorders of breast: Secondary | ICD-10-CM | POA: Diagnosis not present

## 2014-08-21 DIAGNOSIS — R928 Other abnormal and inconclusive findings on diagnostic imaging of breast: Secondary | ICD-10-CM

## 2014-08-21 MED ORDER — GADOBENATE DIMEGLUMINE 529 MG/ML IV SOLN
19.0000 mL | Freq: Once | INTRAVENOUS | Status: AC | PRN
  Administered 2014-08-21: 19 mL via INTRAVENOUS

## 2014-08-25 ENCOUNTER — Other Ambulatory Visit: Payer: Self-pay | Admitting: Surgery

## 2014-08-25 DIAGNOSIS — R928 Other abnormal and inconclusive findings on diagnostic imaging of breast: Secondary | ICD-10-CM

## 2014-09-03 ENCOUNTER — Other Ambulatory Visit: Payer: Self-pay | Admitting: Surgery

## 2014-09-03 ENCOUNTER — Ambulatory Visit
Admission: RE | Admit: 2014-09-03 | Discharge: 2014-09-03 | Disposition: A | Discharge status: Home / Self Care | Payer: Medicare Other | Source: Ambulatory Visit | Attending: Surgery | Admitting: Surgery

## 2014-09-03 DIAGNOSIS — R928 Other abnormal and inconclusive findings on diagnostic imaging of breast: Secondary | ICD-10-CM

## 2014-09-03 DIAGNOSIS — N6012 Diffuse cystic mastopathy of left breast: Secondary | ICD-10-CM | POA: Diagnosis not present

## 2014-09-03 DIAGNOSIS — N6489 Other specified disorders of breast: Secondary | ICD-10-CM | POA: Diagnosis not present

## 2014-10-02 ENCOUNTER — Other Ambulatory Visit: Payer: Self-pay | Admitting: Internal Medicine

## 2014-10-10 ENCOUNTER — Ambulatory Visit: Payer: Self-pay | Admitting: Surgery

## 2014-10-10 DIAGNOSIS — N6320 Unspecified lump in the left breast, unspecified quadrant: Secondary | ICD-10-CM

## 2014-10-10 DIAGNOSIS — N6489 Other specified disorders of breast: Secondary | ICD-10-CM | POA: Diagnosis not present

## 2014-10-10 NOTE — H&P (Signed)
History of Present Illness Jill Garza. Jill Blow MD; 6/3/Jill Garza 1:54 PM) Patient words: discuss path.  The patient is a 64 year old female who presents with a complaint of breast problems. The patient is a 64 year old female who presents with an abnormal mammogram. On 07/28/14, she had a routine screening mamogram that showed some left breast distortion. On 3/23, she underwent 3D tomosynthesis and ultrasound. The left breast showed persistent distortion in the lower outer left breast middle depth seen only on tomo. Ultrasound was negative. She was then referred for discussion for surgical excision, since they are unable to accurately localized the area in 3 dimensions for needle core biopsy. She subsequently underwent MRI and needle biopsy using the Affirm equipment. Pathology showed a radial scar.   CLINICAL DATA: Patient with left breast architectural distortion without sonographic correlate. For further evaluation.  LABS: BUN and creatinine were obtained on site at [place] at  [address]  Results: BUN 18 mg/dL, Creatinine 1.1 mg/dL. GFR = 50  EXAM: BILATERAL BREAST MRI WITH AND WITHOUT CONTRAST  TECHNIQUE: Multiplanar, multisequence MR images of both breasts were obtained prior to and following the intravenous administration of 19 ml of MultiHance.  THREE-DIMENSIONAL MR IMAGE RENDERING ON INDEPENDENT WORKSTATION:  Three-dimensional MR images were rendered by post-processing of the original MR data on an independent workstation. The three-dimensional MR images were interpreted, and findings are reported in the following complete MRI report for this study. Three dimensional images were evaluated at the independent DynaCad workstation  COMPARISON: Previous exam(s).  FINDINGS: Breast composition: b. Scattered fibroglandular tissue.  Background parenchymal enhancement: Mild  Right breast: No mass or abnormal enhancement.  Left breast: Within the  lower outer left breast middle depth there is a 1.9 x 1.3 x 1.0 area of architectural distortion demonstrated best on the T1 and T2 images without contrast (image 42; series 3). There is a mild amount of non mass like contrast enhancement demonstrated within the area of architectural distortion. No additional areas of concerning enhancement identified within the left breast.  Lymph nodes: No abnormal appearing lymph nodes.  Ancillary findings: None.  IMPRESSION: Focal area of architectural distortion with mild associated non mass enhancement within the lower outer left breast middle depth, corresponding with mammographically identified architectural distortion. Considerations include complex sclerosing lesion, radial scar or potentially malignancy.  RECOMMENDATION: Biopsy of mammographically and MRI detected architectural distortion with the Affirm biopsy unit (3D biopsy unit).  This will be scheduled at the patient's convenience.  BI-RADS CATEGORY Garza: Suspicious.   Electronically Signed By: Jill Garza M.D. On: 04/15/Jill Garza 11:06   CLINICAL DATA: Patient presents for biopsy of left breast distortion.  EXAM: LEFT BREAST STEREOTACTIC CORE NEEDLE BIOPSY  COMPARISON: Previous exams.  FINDINGS: The patient and I discussed the procedure of stereotactic-guided biopsy including benefits and alternatives. We discussed the high likelihood of a successful procedure. We discussed the risks of the procedure including infection, bleeding, tissue injury, clip migration, and inadequate sampling. Informed written consent was given. The usual time out protocol was performed immediately prior to the procedure.  Using tomographic guidance, sterile technique and 2% Lidocaine as local anesthetic, under stereotactic guidance, a 9 gauge vacuum assisted device was used to perform core needle biopsy of distortion in the lower outer quadrant of the left breast using a  lateral approach.  At the conclusion of the procedure, X shaped tissue marker clip was deployed into the biopsy cavity. Follow-up 2-view mammogram was performed and dictated separately.  IMPRESSION:  Stereotactic-guided biopsy of left breast distortion. No apparent complications.  Electronically Signed: By: Jill Garza M.D. 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 The differential includes a complex sclerosing lesion, radial scar or sclerosed fibroadenoma. This was found to be concordant by Dr. Nolon Garza. Pathology results called to Dr. Donnie Garza of [facility] (who has seen patient in consultation and made referral for biopsy). Dr. Vonna Garza nurse Jill Garza took pathology results, informed Dr. Georgette Garza of pathology results and contacted the patient. Jill Garza will coordinate future care per Dr. Donnie Garza.  Pathology results reported by Jill Garza, Jill Garza.  Agree with above. Excision is recommended to exclude malignancy.   Electronically Signed By: Jill Garza M.D. On: 05/04/Jill Garza 13:58   Allergies Elbert Ewings, CMA; 6/3/Jill Garza 10:57 AM) Penicillins Dyes Percocet *ANALGESICS - OPIOID*  Medication History Elbert Ewings, CMA; 6/3/Jill Garza 10:58 AM) Aspirin EC (81MG  Tablet DR, Oral) Active. Hyzaar (100-12.5MG  Tablet, Oral) Active. Glucophage (500MG  Tablet, Oral) Active. PriLOSEC (20MG  Capsule DR, Oral) Active. Paxil (30MG  Tablet, Oral) Active. Zocor (10MG  Tablet, Oral) Active. Clindamycin HCl (300MG  Capsule, Oral) Active. Calcium Carbonate (600MG  Tablet, Oral daily) Active. Medications Reconciled    Vitals Elbert Ewings CMA; 6/3/Jill Garza 10:59 AM) 6/3/Jill Garza 10:58 AM Weight: 200 lb Height: 64in Body Surface Area: 2.02 m Body Mass Index: 34.33 kg/m Temp.: 98.12F(Oral)  Pulse: 80 (Regular)  Resp.: 16 (Unlabored)   BP: 128/62 (Sitting, Left Arm, Standard)     Physical Exam Jill Key K. Ariauna Farabee MD; 6/3/Jill Garza 1:54 PM)  The physical exam findings are as follows: Note:WDWN in NAD HEENT: EOMI, sclera anicteric Neck: No masses, no thyromegaly Lungs: CTA bilaterally; normal respiratory effort Breasts - symmetric; no axillary lymphadenopathy; no palpable masses; no skin changes; no nipple retraction CV: Regular rate and rhythm; no murmurs Abd: +bowel sounds, soft, non-tender, no masses Ext: Well-perfused; no edema Skin: Warm, dry; no sign of jaundice    Assessment & Plan Jill Key K. 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 The surgical procedure has been discussed with the patient. Potential risks, benefits, alternative treatments, and expected outcomes have been explained. All of the patient's questions at this time have been answered. The likelihood of reaching the patient's treatment goal is good. The patient understand the proposed surgical procedure and wishes to proceed.  Jill Garza. Jill Dover, MD, [facility]  General/ Trauma Surgery  6/3/Jill Garza 1:55 PM

## 2014-10-20 ENCOUNTER — Encounter: Payer: Self-pay | Admitting: Internal Medicine

## 2014-10-28 ENCOUNTER — Other Ambulatory Visit: Payer: Self-pay | Admitting: Surgery

## 2014-10-28 DIAGNOSIS — N6320 Unspecified lump in the left breast, unspecified quadrant: Secondary | ICD-10-CM

## 2014-11-10 ENCOUNTER — Other Ambulatory Visit: Payer: Self-pay | Admitting: Internal Medicine

## 2014-11-21 ENCOUNTER — Encounter: Payer: Self-pay | Admitting: Gastroenterology

## 2014-11-25 DIAGNOSIS — D225 Melanocytic nevi of trunk: Secondary | ICD-10-CM | POA: Diagnosis not present

## 2014-11-25 DIAGNOSIS — L814 Other melanin hyperpigmentation: Secondary | ICD-10-CM | POA: Diagnosis not present

## 2014-11-25 DIAGNOSIS — Z85820 Personal history of malignant melanoma of skin: Secondary | ICD-10-CM | POA: Diagnosis not present

## 2014-11-25 DIAGNOSIS — Z08 Encounter for follow-up examination after completed treatment for malignant neoplasm: Secondary | ICD-10-CM | POA: Diagnosis not present

## 2014-11-26 DIAGNOSIS — H1030 Unspecified acute conjunctivitis, unspecified eye: Secondary | ICD-10-CM | POA: Diagnosis not present

## 2014-12-10 ENCOUNTER — Other Ambulatory Visit: Payer: Self-pay | Admitting: Nurse Practitioner

## 2014-12-23 ENCOUNTER — Other Ambulatory Visit: Payer: Medicare Other

## 2014-12-29 ENCOUNTER — Encounter (HOSPITAL_BASED_OUTPATIENT_CLINIC_OR_DEPARTMENT_OTHER): Payer: Self-pay | Admitting: *Deleted

## 2014-12-30 ENCOUNTER — Encounter: Payer: Medicare Other | Admitting: Internal Medicine

## 2014-12-30 ENCOUNTER — Ambulatory Visit
Admission: RE | Admit: 2014-12-30 | Discharge: 2014-12-30 | Disposition: A | Discharge status: Home / Self Care | Payer: Medicare Other | Source: Ambulatory Visit | Attending: Surgery | Admitting: Surgery

## 2014-12-30 ENCOUNTER — Encounter (HOSPITAL_BASED_OUTPATIENT_CLINIC_OR_DEPARTMENT_OTHER)
Admission: RE | Admit: 2014-12-30 | Discharge: 2014-12-30 | Disposition: A | Discharge status: Home / Self Care | Payer: Medicare Other | Source: Ambulatory Visit | Attending: Surgery | Admitting: Surgery

## 2014-12-30 DIAGNOSIS — E119 Type 2 diabetes mellitus without complications: Secondary | ICD-10-CM | POA: Diagnosis not present

## 2014-12-30 DIAGNOSIS — N6489 Other specified disorders of breast: Secondary | ICD-10-CM | POA: Diagnosis present

## 2014-12-30 DIAGNOSIS — Z6834 Body mass index (BMI) 34.0-34.9, adult: Secondary | ICD-10-CM | POA: Diagnosis not present

## 2014-12-30 DIAGNOSIS — N6320 Unspecified lump in the left breast, unspecified quadrant: Secondary | ICD-10-CM

## 2014-12-30 DIAGNOSIS — Z87891 Personal history of nicotine dependence: Secondary | ICD-10-CM | POA: Diagnosis not present

## 2014-12-30 DIAGNOSIS — K219 Gastro-esophageal reflux disease without esophagitis: Secondary | ICD-10-CM | POA: Diagnosis not present

## 2014-12-30 DIAGNOSIS — N6092 Unspecified benign mammary dysplasia of left breast: Secondary | ICD-10-CM | POA: Diagnosis not present

## 2014-12-30 DIAGNOSIS — I10 Essential (primary) hypertension: Secondary | ICD-10-CM | POA: Diagnosis not present

## 2014-12-30 DIAGNOSIS — N6012 Diffuse cystic mastopathy of left breast: Secondary | ICD-10-CM | POA: Diagnosis not present

## 2014-12-30 DIAGNOSIS — K449 Diaphragmatic hernia without obstruction or gangrene: Secondary | ICD-10-CM | POA: Diagnosis not present

## 2014-12-30 DIAGNOSIS — F329 Major depressive disorder, single episode, unspecified: Secondary | ICD-10-CM | POA: Diagnosis not present

## 2014-12-30 LAB — BASIC METABOLIC PANEL
ANION GAP: 7 (ref 5–15)
BUN: 14 mg/dL (ref 6–20)
CO2: 29 mmol/L (ref 22–32)
Calcium: 10.3 mg/dL (ref 8.9–10.3)
Chloride: 102 mmol/L (ref 101–111)
Creatinine, Ser: 1.01 mg/dL — ABNORMAL HIGH (ref 0.44–1.00)
GFR calc Af Amer: >60 mL/min (ref >60)
GFR, EST NON AFRICAN AMERICAN: 57 mL/min — AB (ref >60)
GLUCOSE: 97 mg/dL (ref 65–99)
POTASSIUM: 4 mmol/L (ref 3.5–5.1)
Sodium: 138 mmol/L (ref 135–145)

## 2014-12-31 ENCOUNTER — Ambulatory Visit
Admission: RE | Admit: 2014-12-31 | Discharge: 2014-12-31 | Disposition: A | Discharge status: Home / Self Care | Payer: Medicare Other | Source: Ambulatory Visit | Attending: Surgery | Admitting: Surgery

## 2014-12-31 ENCOUNTER — Encounter (HOSPITAL_BASED_OUTPATIENT_CLINIC_OR_DEPARTMENT_OTHER)
Admission: RE | Disposition: A | Discharge status: Home / Self Care | Payer: Self-pay | Source: Ambulatory Visit | Attending: Surgery

## 2014-12-31 ENCOUNTER — Encounter (HOSPITAL_BASED_OUTPATIENT_CLINIC_OR_DEPARTMENT_OTHER): Payer: Self-pay | Admitting: Certified Registered"

## 2014-12-31 ENCOUNTER — Ambulatory Visit (HOSPITAL_BASED_OUTPATIENT_CLINIC_OR_DEPARTMENT_OTHER)
Admission: RE | Admit: 2014-12-31 | Discharge: 2014-12-31 | Disposition: A | Discharge status: Home / Self Care | Payer: Medicare Other | Source: Ambulatory Visit | Attending: Surgery | Admitting: Surgery

## 2014-12-31 ENCOUNTER — Ambulatory Visit (HOSPITAL_BASED_OUTPATIENT_CLINIC_OR_DEPARTMENT_OTHER): Payer: Medicare Other | Admitting: Certified Registered"

## 2014-12-31 DIAGNOSIS — Z87891 Personal history of nicotine dependence: Secondary | ICD-10-CM | POA: Diagnosis not present

## 2014-12-31 DIAGNOSIS — F329 Major depressive disorder, single episode, unspecified: Secondary | ICD-10-CM | POA: Diagnosis not present

## 2014-12-31 DIAGNOSIS — N6092 Unspecified benign mammary dysplasia of left breast: Secondary | ICD-10-CM | POA: Diagnosis not present

## 2014-12-31 DIAGNOSIS — I10 Essential (primary) hypertension: Secondary | ICD-10-CM | POA: Insufficient documentation

## 2014-12-31 DIAGNOSIS — N6320 Unspecified lump in the left breast, unspecified quadrant: Secondary | ICD-10-CM

## 2014-12-31 DIAGNOSIS — K449 Diaphragmatic hernia without obstruction or gangrene: Secondary | ICD-10-CM | POA: Diagnosis not present

## 2014-12-31 DIAGNOSIS — N6012 Diffuse cystic mastopathy of left breast: Secondary | ICD-10-CM | POA: Diagnosis not present

## 2014-12-31 DIAGNOSIS — E669 Obesity, unspecified: Secondary | ICD-10-CM | POA: Diagnosis not present

## 2014-12-31 DIAGNOSIS — Z6834 Body mass index (BMI) 34.0-34.9, adult: Secondary | ICD-10-CM | POA: Insufficient documentation

## 2014-12-31 DIAGNOSIS — N63 Unspecified lump in breast: Secondary | ICD-10-CM | POA: Diagnosis not present

## 2014-12-31 DIAGNOSIS — K219 Gastro-esophageal reflux disease without esophagitis: Secondary | ICD-10-CM | POA: Insufficient documentation

## 2014-12-31 DIAGNOSIS — N6082 Other benign mammary dysplasias of left breast: Secondary | ICD-10-CM | POA: Diagnosis not present

## 2014-12-31 DIAGNOSIS — E119 Type 2 diabetes mellitus without complications: Secondary | ICD-10-CM | POA: Insufficient documentation

## 2014-12-31 DIAGNOSIS — N6489 Other specified disorders of breast: Secondary | ICD-10-CM | POA: Diagnosis not present

## 2014-12-31 HISTORY — PX: BREAST LUMPECTOMY WITH RADIOACTIVE SEED LOCALIZATION: SHX6424

## 2014-12-31 HISTORY — DX: Unspecified osteoarthritis, unspecified site: M19.90

## 2014-12-31 HISTORY — DX: Personal history of other diseases of the digestive system: Z87.19

## 2014-12-31 LAB — GLUCOSE, CAPILLARY
GLUCOSE-CAPILLARY: 120 mg/dL — AB (ref 65–99)
Glucose-Capillary: 115 mg/dL — ABNORMAL HIGH (ref 65–99)

## 2014-12-31 LAB — POCT HEMOGLOBIN-HEMACUE: HEMOGLOBIN: 13.6 g/dL (ref 12.0–15.0)

## 2014-12-31 SURGERY — BREAST LUMPECTOMY WITH RADIOACTIVE SEED LOCALIZATION
Anesthesia: General | Site: Breast | Laterality: Left

## 2014-12-31 MED ORDER — ONDANSETRON HCL 4 MG/2ML IJ SOLN: 4.0000 mg | INTRAMUSCULAR | Status: DC | PRN

## 2014-12-31 MED ORDER — PROMETHAZINE HCL 25 MG/ML IJ SOLN: 6.2500 mg | INTRAMUSCULAR | Status: DC | PRN

## 2014-12-31 MED ORDER — BUPIVACAINE-EPINEPHRINE (PF) 0.5% -1:200000 IJ SOLN
INTRAMUSCULAR | Status: AC
  Filled 2014-12-31: qty 60

## 2014-12-31 MED ORDER — FENTANYL CITRATE (PF) 100 MCG/2ML IJ SOLN
50.0000 ug | INTRAMUSCULAR | Status: AC | PRN
  Administered 2014-12-31 (×2): 25 ug via INTRAVENOUS
  Administered 2014-12-31: 50 ug via INTRAVENOUS

## 2014-12-31 MED ORDER — PROPOFOL 10 MG/ML IV BOLUS
INTRAVENOUS | Status: DC | PRN
  Administered 2014-12-31: 150 mg via INTRAVENOUS

## 2014-12-31 MED ORDER — BUPIVACAINE HCL (PF) 0.5 % IJ SOLN
INTRAMUSCULAR | Status: AC
  Filled 2014-12-31: qty 30

## 2014-12-31 MED ORDER — MIDAZOLAM HCL 2 MG/2ML IJ SOLN
INTRAMUSCULAR | Status: AC
  Filled 2014-12-31: qty 2

## 2014-12-31 MED ORDER — MIDAZOLAM HCL 2 MG/2ML IJ SOLN: 1.0000 mg | INTRAMUSCULAR | Status: DC | PRN

## 2014-12-31 MED ORDER — BUPIVACAINE-EPINEPHRINE (PF) 0.25% -1:200000 IJ SOLN
INTRAMUSCULAR | Status: AC
  Filled 2014-12-31: qty 30

## 2014-12-31 MED ORDER — FENTANYL CITRATE (PF) 100 MCG/2ML IJ SOLN
INTRAMUSCULAR | Status: AC
  Filled 2014-12-31: qty 6

## 2014-12-31 MED ORDER — SCOPOLAMINE 1 MG/3DAYS TD PT72: 1.0000 | MEDICATED_PATCH | Freq: Once | TRANSDERMAL | Status: DC | PRN

## 2014-12-31 MED ORDER — BUPIVACAINE HCL (PF) 0.25 % IJ SOLN
INTRAMUSCULAR | Status: AC
  Filled 2014-12-31: qty 30

## 2014-12-31 MED ORDER — EPHEDRINE SULFATE 50 MG/ML IJ SOLN
INTRAMUSCULAR | Status: DC | PRN
  Administered 2014-12-31 (×3): 10 mg via INTRAVENOUS

## 2014-12-31 MED ORDER — MORPHINE SULFATE (PF) 2 MG/ML IV SOLN: 2.0000 mg | INTRAVENOUS | Status: DC | PRN

## 2014-12-31 MED ORDER — ONDANSETRON HCL 4 MG/2ML IJ SOLN
INTRAMUSCULAR | Status: DC | PRN
  Administered 2014-12-31: 4 mg via INTRAVENOUS

## 2014-12-31 MED ORDER — CEFAZOLIN SODIUM-DEXTROSE 2-3 GM-% IV SOLR
2.0000 g | INTRAVENOUS | Status: AC
  Administered 2014-12-31: 2 g via INTRAVENOUS

## 2014-12-31 MED ORDER — HYDROCODONE-ACETAMINOPHEN 5-325 MG PO TABS
1.0000 | ORAL_TABLET | ORAL | Status: DC | PRN
Start: 2014-12-31 — End: 2014-12-31

## 2014-12-31 MED ORDER — PROPOFOL 500 MG/50ML IV EMUL
INTRAVENOUS | Status: AC
  Filled 2014-12-31: qty 50

## 2014-12-31 MED ORDER — SODIUM CHLORIDE 0.9 % IJ SOLN
INTRAMUSCULAR | Status: AC
  Filled 2014-12-31: qty 10

## 2014-12-31 MED ORDER — METHYLENE BLUE 1 % INJ SOLN
INTRAMUSCULAR | Status: AC
  Filled 2014-12-31: qty 10

## 2014-12-31 MED ORDER — DEXAMETHASONE SODIUM PHOSPHATE 4 MG/ML IJ SOLN
INTRAMUSCULAR | Status: DC | PRN
  Administered 2014-12-31: 4 mg via INTRAVENOUS

## 2014-12-31 MED ORDER — HYDROMORPHONE HCL 1 MG/ML IJ SOLN: 0.2500 mg | INTRAMUSCULAR | Status: DC | PRN

## 2014-12-31 MED ORDER — CEFAZOLIN SODIUM-DEXTROSE 2-3 GM-% IV SOLR
INTRAVENOUS | Status: AC
  Filled 2014-12-31: qty 50

## 2014-12-31 MED ORDER — CHLORHEXIDINE GLUCONATE 4 % EX LIQD: 1.0000 "application " | Freq: Once | CUTANEOUS | Status: DC

## 2014-12-31 MED ORDER — BUPIVACAINE-EPINEPHRINE 0.25% -1:200000 IJ SOLN
INTRAMUSCULAR | Status: DC | PRN
  Administered 2014-12-31: 8 mL

## 2014-12-31 MED ORDER — LACTATED RINGERS IV SOLN
INTRAVENOUS | Status: DC
  Administered 2014-12-31 (×2): via INTRAVENOUS

## 2014-12-31 MED ORDER — HYDROCODONE-ACETAMINOPHEN 5-325 MG PO TABS: 1.0000 | ORAL_TABLET | ORAL | Status: DC | PRN

## 2014-12-31 MED ORDER — LIDOCAINE HCL (CARDIAC) 20 MG/ML IV SOLN
INTRAVENOUS | Status: DC | PRN
  Administered 2014-12-31: 60 mg via INTRAVENOUS

## 2014-12-31 MED ORDER — GLYCOPYRROLATE 0.2 MG/ML IJ SOLN
0.2000 mg | Freq: Once | INTRAMUSCULAR | Status: AC | PRN
  Administered 2014-12-31: 0.1 mg via INTRAVENOUS

## 2014-12-31 SURGICAL SUPPLY — 53 items
APPLIER CLIP 9.375 MED OPEN (MISCELLANEOUS) ×3
BENZOIN TINCTURE PRP APPL 2/3 (GAUZE/BANDAGES/DRESSINGS) ×3 IMPLANT
BLADE HEX COATED 2.75 (ELECTRODE) ×3 IMPLANT
BLADE SURG 15 STRL LF DISP TIS (BLADE) ×1 IMPLANT
BLADE SURG 15 STRL SS (BLADE) ×2
CANISTER SUC SOCK COL 7IN (MISCELLANEOUS) IMPLANT
CANISTER SUCT 1200ML W/VALVE (MISCELLANEOUS) ×3 IMPLANT
CHLORAPREP W/TINT 26ML (MISCELLANEOUS) ×3 IMPLANT
CLIP APPLIE 9.375 MED OPEN (MISCELLANEOUS) ×1 IMPLANT
CLOSURE WOUND 1/2 X4 (GAUZE/BANDAGES/DRESSINGS) ×1
COVER BACK TABLE 60X90IN (DRAPES) ×3 IMPLANT
COVER MAYO STAND STRL (DRAPES) ×3 IMPLANT
COVER PROBE W GEL 5X96 (DRAPES) ×3 IMPLANT
DECANTER SPIKE VIAL GLASS SM (MISCELLANEOUS) IMPLANT
DEVICE DUBIN W/COMP PLATE 8390 (MISCELLANEOUS) ×3 IMPLANT
DRAPE LAPAROTOMY 100X72 PEDS (DRAPES) ×3 IMPLANT
DRAPE UTILITY XL STRL (DRAPES) ×3 IMPLANT
DRSG TEGADERM 4X4.75 (GAUZE/BANDAGES/DRESSINGS) ×3 IMPLANT
ELECT REM PT RETURN 9FT ADLT (ELECTROSURGICAL) ×3
ELECTRODE REM PT RTRN 9FT ADLT (ELECTROSURGICAL) ×1 IMPLANT
GLOVE BIO SURGEON STRL SZ 6.5 (GLOVE) ×2 IMPLANT
GLOVE BIO SURGEON STRL SZ7 (GLOVE) ×3 IMPLANT
GLOVE BIO SURGEONS STRL SZ 6.5 (GLOVE) ×1
GLOVE BIOGEL PI IND STRL 7.0 (GLOVE) ×2 IMPLANT
GLOVE BIOGEL PI IND STRL 7.5 (GLOVE) ×1 IMPLANT
GLOVE BIOGEL PI INDICATOR 7.0 (GLOVE) ×4
GLOVE BIOGEL PI INDICATOR 7.5 (GLOVE) ×2
GOWN STRL REUS W/ TWL LRG LVL3 (GOWN DISPOSABLE) ×2 IMPLANT
GOWN STRL REUS W/TWL LRG LVL3 (GOWN DISPOSABLE) ×4
KIT MARKER MARGIN INK (KITS) ×3 IMPLANT
LIGHT WAVEGUIDE WIDE FLAT (MISCELLANEOUS) ×3 IMPLANT
NEEDLE HYPO 25X1 1.5 SAFETY (NEEDLE) ×3 IMPLANT
NS IRRIG 1000ML POUR BTL (IV SOLUTION) ×3 IMPLANT
PACK BASIN DAY SURGERY FS (CUSTOM PROCEDURE TRAY) ×3 IMPLANT
PENCIL BUTTON HOLSTER BLD 10FT (ELECTRODE) ×3 IMPLANT
SLEEVE SCD COMPRESS KNEE MED (MISCELLANEOUS) ×3 IMPLANT
SPONGE GAUZE 2X2 8PLY STER LF (GAUZE/BANDAGES/DRESSINGS) ×1
SPONGE GAUZE 2X2 8PLY STRL LF (GAUZE/BANDAGES/DRESSINGS) ×2 IMPLANT
SPONGE GAUZE 4X4 12PLY STER LF (GAUZE/BANDAGES/DRESSINGS) IMPLANT
SPONGE LAP 18X18 X RAY DECT (DISPOSABLE) IMPLANT
SPONGE LAP 4X18 X RAY DECT (DISPOSABLE) ×3 IMPLANT
STRIP CLOSURE SKIN 1/2X4 (GAUZE/BANDAGES/DRESSINGS) ×2 IMPLANT
SUT MON AB 4-0 PC3 18 (SUTURE) ×3 IMPLANT
SUT SILK 2 0 SH (SUTURE) IMPLANT
SUT VIC AB 3-0 SH 27 (SUTURE) ×2
SUT VIC AB 3-0 SH 27X BRD (SUTURE) ×1 IMPLANT
SYR BULB 3OZ (MISCELLANEOUS) IMPLANT
SYR CONTROL 10ML LL (SYRINGE) ×3 IMPLANT
TOWEL OR 17X24 6PK STRL BLUE (TOWEL DISPOSABLE) ×3 IMPLANT
TOWEL OR NON WOVEN STRL DISP B (DISPOSABLE) ×3 IMPLANT
TUBE CONNECTING 20'X1/4 (TUBING) ×1
TUBE CONNECTING 20X1/4 (TUBING) ×2 IMPLANT
YANKAUER SUCT BULB TIP NO VENT (SUCTIONS) ×3 IMPLANT

## 2014-12-31 NOTE — Op Note (Signed)
Preop diagnosis: Left breast radial scar Postop diagnosis: Same Procedure performed: Left radioactive seed localized lumpectomy Surgeon:Dimas Scheck K. Anesthesia: Gen. LMA Indications: This is a 64 year old female who had an abnormal finding on a recent mammogram. Core needle biopsy showed a radial scar. She presents now for excision of this area. She had a radioactive seed placed yesterday by radiology. The presence of the seed was confirmed in the preop holding area.  Description of procedure: The patient was brought to the operating room and placed in a supine position on the operating room table. After an adequate level of general anesthesia was obtained her left breast was prepped with ChloraPrep and draped sterile fashion. A timeout was taken to ensure the proper patient and proper procedure. We identified the area of greatest activity in the lower outer quadrant of the left breast. A transverse incision was made directly over this area after infiltrating 0.25% Marcaine with epinephrine. We dissected down into the breast tissue. We raised margins around the 4 sides of the area of greatest activity. The specimen was grasped with a clamp and lifted. We made sure that we were deep enough to be behind the seed and remove the specimen. The specimen was oriented with a paint kit. It was no background activity within the lumpectomy site. Specimen mammogram confirmed the seed and the clip within the specimen. The seed seem to be relatively close to the medial margin. I excised an additional 1 cm of the medial margin and also oriented this with paint.  Both of these were sent for pathologic examination. We irrigated the wound thoroughly and inspected for hemostasis. We placed 5 clips in the 4 margins and the deep margin. The wound was closed with 3-0 Vicryl and 4-0 Monocryl. Steri-Strips and clean dressings were applied. The patient was then extubated and brought to the recovery room in stable condition. All  sponge, instrument, and needle counts are correct.  Imogene Burn. Georgette Dover, MD, [facility]  General/ Trauma Surgery  12/31/2014 9:37 AM

## 2014-12-31 NOTE — Anesthesia Postprocedure Evaluation (Signed)
  Anesthesia Post-op Note  Patient: Jill Garza [facility]  Procedure(s) Performed: Procedure(s): LEFT BREAST LUMPECTOMY WITH RADIOACTIVE SEED LOCALIZATION (Left)  Patient Location: PACU  Anesthesia Type:General  Level of Consciousness: awake and alert   Airway and Oxygen Therapy: Patient Spontanous Breathing  Post-op Pain: mild  Post-op Assessment: Post-op Vital signs reviewed              Post-op Vital Signs: stable  Last Vitals:  Filed Vitals:   12/31/14 1000  BP: 109/56  Pulse: 96  Temp:   Resp: 20    Complications: No apparent anesthesia complications

## 2014-12-31 NOTE — Discharge Instructions (Signed)
[facility] °Office Phone Number [phone] ° °BREAST BIOPSY/ PARTIAL MASTECTOMY: POST OP INSTRUCTIONS ° °Always review your discharge instruction sheet given to you by the facility where your surgery was performed. ° °IF YOU HAVE DISABILITY OR FAMILY LEAVE FORMS, YOU MUST BRING THEM TO THE OFFICE FOR PROCESSING.  DO NOT GIVE THEM TO YOUR DOCTOR. ° °1. A prescription for pain medication may be given to you upon discharge.  Take your pain medication as prescribed, if needed.  If narcotic pain medicine is not needed, then you may take acetaminophen (Tylenol) or ibuprofen (Advil) as needed. °2. Take your usually prescribed medications unless otherwise directed °3. If you need a refill on your pain medication, please contact your pharmacy.  They will contact our office to request authorization.  Prescriptions will not be filled after 5pm or on week-ends. °4. You should eat very light the first 24 hours after surgery, such as soup, crackers, pudding, etc.  Resume your normal diet the day after surgery. °5. Most patients will experience some swelling and bruising in the breast.  Ice packs and a good support bra will help.  Swelling and bruising can take several days to resolve.  °6. It is common to experience some constipation if taking pain medication after surgery.  Increasing fluid intake and taking a stool softener will usually help or prevent this problem from occurring.  A mild laxative (Milk of Magnesia or Miralax) should be taken according to package directions if there are no bowel movements after 48 hours. °7. Unless discharge instructions indicate otherwise, you may remove your bandages 24-48 hours after surgery, and you may shower at that time.  You may have steri-strips (small skin tapes) in place directly over the incision.  These strips should be left on the skin for 7-10 days.  If your surgeon used skin glue on the incision, you may shower in 24 hours.  The glue will flake off over the  next 2-3 weeks.  Any sutures or staples will be removed at the office during your follow-up visit. °8. ACTIVITIES:  You may resume regular daily activities (gradually increasing) beginning the next day.  Wearing a good support bra or sports bra minimizes pain and swelling.  You may have sexual intercourse when it is comfortable. °a. You may drive when you no longer are taking prescription pain medication, you can comfortably wear a seatbelt, and you can safely maneuver your car and apply brakes. °b. RETURN TO WORK:  ______________________________________________________________________________________ °9. You should see your doctor in the office for a follow-up appointment approximately two weeks after your surgery.  Your doctor’s nurse will typically make your follow-up appointment when she calls you with your pathology report.  Expect your pathology report 2-3 business days after your surgery.  You may call to check if you do not hear from us after three days. °10. OTHER INSTRUCTIONS: _______________________________________________________________________________________________ _____________________________________________________________________________________________________________________________________ °_____________________________________________________________________________________________________________________________________ °_____________________________________________________________________________________________________________________________________ ° °WHEN TO CALL YOUR DOCTOR: °1. Fever over 101.0 °2. Nausea and/or vomiting. °3. Extreme swelling or bruising. °4. Continued bleeding from incision. °5. Increased pain, redness, or drainage from the incision. ° °The clinic staff is available to answer your questions during regular business hours.  Please don’t hesitate to call and ask to speak to one of the nurses for clinical concerns.  If you have a medical emergency, go to the nearest  emergency room or call 911.  A surgeon from [facility] is always on call at the hospital. ° °For further questions, please visit [email or web address]  ° ° ° °  Post Anesthesia Home Care Instructions ° °Activity: °Get plenty of rest for the remainder of the day. A responsible adult should stay with you for 24 hours following the procedure.  °For the next 24 hours, DO NOT: °-Drive a car °-Operate machinery °-Drink alcoholic beverages °-Take any medication unless instructed by your physician °-Make any legal decisions or sign important papers. ° °Meals: °Start with liquid foods such as gelatin or soup. Progress to regular foods as tolerated. Avoid greasy, spicy, heavy foods. If nausea and/or vomiting occur, drink only clear liquids until the nausea and/or vomiting subsides. Call your physician if vomiting continues. ° °Special Instructions/Symptoms: °Your throat may feel dry or sore from the anesthesia or the breathing tube placed in your throat during surgery. If this causes discomfort, gargle with warm salt water. The discomfort should disappear within 24 hours. ° °If you had a scopolamine patch placed behind your ear for the management of post- operative nausea and/or vomiting: ° °1. The medication in the patch is effective for 72 hours, after which it should be removed.  Wrap patch in a tissue and discard in the trash. Wash hands thoroughly with soap and water. °2. You may remove the patch earlier than 72 hours if you experience unpleasant side effects which may include dry mouth, dizziness or visual disturbances. °3. Avoid touching the patch. Wash your hands with soap and water after contact with the patch. °  ° °

## 2014-12-31 NOTE — Anesthesia Procedure Notes (Signed)
Procedure Name: LMA Insertion Date/Time: 12/31/2014 8:31 AM Performed by: Tima Curet D Pre-anesthesia Checklist: Patient identified, Emergency Drugs available, Suction available and Patient being monitored Patient Re-evaluated:Patient Re-evaluated prior to inductionOxygen Delivery Method: Circle System Utilized Preoxygenation: Pre-oxygenation with 100% oxygen Intubation Type: IV induction Ventilation: Mask ventilation without difficulty LMA: LMA inserted LMA Size: 4.0 Number of attempts: 1 Airway Equipment and Method: Bite block Placement Confirmation: positive ETCO2 Tube secured with: Tape Dental Injury: Teeth and Oropharynx as per pre-operative assessment

## 2014-12-31 NOTE — H&P (Signed)
Expand All Collapse All    History of Present Illness  Patient words: discuss path.  The patient is a 64 year old female who presents with a complaint of breast problems. The patient is a 64 year old female who presents with an abnormal mammogram. On 07/28/14, she had a routine screening mamogram that showed some left breast distortion. On 3/23, she underwent 3D tomosynthesis and ultrasound. The left breast showed persistent distortion in the lower outer left breast middle depth seen only on tomo. Ultrasound was negative. She was then referred for discussion for surgical excision, since they are unable to accurately localized the area in 3 dimensions for needle core biopsy. She subsequently underwent MRI and needle biopsy using the Affirm equipment. Pathology showed a radial scar.   CLINICAL DATA: Patient with left breast architectural distortion without sonographic correlate. For further evaluation.  LABS: BUN and creatinine were obtained on site at [place] at  [address]  Results: BUN 18 mg/dL, Creatinine 1.1 mg/dL. GFR = 50  EXAM: BILATERAL BREAST MRI WITH AND WITHOUT CONTRAST  TECHNIQUE: Multiplanar, multisequence MR images of both breasts were obtained prior to and following the intravenous administration of 19 ml of MultiHance.  THREE-DIMENSIONAL MR IMAGE RENDERING ON INDEPENDENT WORKSTATION:  Three-dimensional MR images were rendered by post-processing of the original MR data on an independent workstation. The three-dimensional MR images were interpreted, and findings are reported in the following complete MRI report for this study. Three dimensional images were evaluated at the independent DynaCad workstation  COMPARISON: Previous exam(s).  FINDINGS: Breast composition: b. Scattered fibroglandular tissue.  Background parenchymal enhancement: Mild  Right breast: No mass or abnormal enhancement.  Left breast: Within the lower  outer left breast middle depth there is a 1.9 x 1.3 x 1.0 area of architectural distortion demonstrated best on the T1 and T2 images without contrast (image 42; series 3). There is a mild amount of non mass like contrast enhancement demonstrated within the area of architectural distortion. No additional areas of concerning enhancement identified within the left breast.  Lymph nodes: No abnormal appearing lymph nodes.  Ancillary findings: None.  IMPRESSION: Focal area of architectural distortion with mild associated non mass enhancement within the lower outer left breast middle depth, corresponding with mammographically identified architectural distortion. Considerations include complex sclerosing lesion, radial scar or potentially malignancy.  RECOMMENDATION: Biopsy of mammographically and MRI detected architectural distortion with the Affirm biopsy unit (3D biopsy unit).  This will be scheduled at the patient's convenience.  BI-RADS CATEGORY 4: Suspicious.   Electronically Signed By: Lovey Newcomer M.D. On: 08/22/2014 11:06   CLINICAL DATA: Patient presents for biopsy of left breast distortion.  EXAM: LEFT BREAST STEREOTACTIC CORE NEEDLE BIOPSY  COMPARISON: Previous exams.  FINDINGS: The patient and I discussed the procedure of stereotactic-guided biopsy including benefits and alternatives. We discussed the high likelihood of a successful procedure. We discussed the risks of the procedure including infection, bleeding, tissue injury, clip migration, and inadequate sampling. Informed written consent was given. The usual time out protocol was performed immediately prior to the procedure.  Using tomographic guidance, sterile technique and 2% Lidocaine as local anesthetic, under stereotactic guidance, a 9 gauge vacuum assisted device was used to perform core needle biopsy of distortion in the lower outer quadrant of the left breast using a lateral approach.  At  the conclusion of the procedure, X shaped tissue marker clip was deployed into the biopsy cavity. Follow-up 2-view mammogram was performed and  dictated separately.  IMPRESSION: Stereotactic-guided biopsy of left breast distortion. No apparent complications.  Electronically Signed: By: Nolon Nations M.D. 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 The differential includes a complex sclerosing lesion, radial scar or sclerosed fibroadenoma. This was found to be concordant by Dr. Nolon Nations. Pathology results called to Dr. Donnie Mesa of [facility] (who has seen patient in consultation and made referral for biopsy). Dr. Vonna Kotyk nurse Aimee took pathology results, informed Dr. Georgette Dover of pathology results and contacted the patient. Aimee will coordinate future care per Dr. Donnie Mesa.  Pathology results reported by Terie Purser RN on Sep 10, 2014.  Agree with above. Excision is recommended to exclude malignancy.   Electronically Signed By: Nolon Nations M.D. On: 09/10/2014 13:58   Allergies  Penicillins Dyes Percocet *ANALGESICS - OPIOID*  Medication History Aspirin EC (81MG  Tablet DR, Oral) Active. Hyzaar (100-12.5MG  Tablet, Oral) Active. Glucophage (500MG  Tablet, Oral) Active. PriLOSEC (20MG  Capsule DR, Oral) Active. Paxil (30MG  Tablet, Oral) Active. Zocor (10MG  Tablet, Oral) Active. Clindamycin HCl (300MG  Capsule, Oral) Active. Calcium Carbonate (600MG  Tablet, Oral daily) Active. Medications Reconciled    Vitals Weight: 200 lb Height: 64in Body Surface Area: 2.02 m Body Mass Index: 34.33 kg/m Temp.: 98.78F(Oral)  Pulse: 80 (Regular)  Resp.: 16 (Unlabored)  BP: 128/62 (Sitting, Left Arm, Standard)     Physical Exam   The physical exam findings are as follows: Note:WDWN in NAD HEENT: EOMI, sclera  anicteric Neck: No masses, no thyromegaly Lungs: CTA bilaterally; normal respiratory effort Breasts - symmetric; no axillary lymphadenopathy; no palpable masses; no skin changes; no nipple retraction CV: Regular rate and rhythm; no murmurs Abd: +bowel sounds, soft, non-tender, no masses Ext: Well-perfused; no edema Skin: Warm, dry; no sign of jaundice    Assessment & Plan  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 The surgical procedure has been discussed with the patient. Potential risks, benefits, alternative treatments, and expected outcomes have been explained. All of the patient's questions at this time have been answered. The likelihood of reaching the patient's treatment goal is good. The patient understand the proposed surgical procedure and wishes to proceed.     Imogene Burn. Georgette Dover, MD, [facility]  General/ Trauma Surgery  12/31/2014 6:37 AM

## 2014-12-31 NOTE — Anesthesia Preprocedure Evaluation (Addendum)
Anesthesia Evaluation  Patient identified by MRN, date of birth, ID band Patient awake    Reviewed: Allergy & Precautions, NPO status   History of Anesthesia Complications Negative for: history of anesthetic complications  Airway Mallampati: II  TM Distance: >3 FB Neck ROM: Full    Dental  (+) Teeth Intact, Caps,    Pulmonary former smoker,  breath sounds clear to auscultation        Cardiovascular hypertension, Pt. on medications Rhythm:Regular Rate:Normal     Neuro/Psych PSYCHIATRIC DISORDERS Depression    GI/Hepatic hiatal hernia, GERD-  Medicated,  Endo/Other  diabetes, Type 2, Oral Hypoglycemic AgentsMorbid obesity  Renal/GU      Musculoskeletal   Abdominal   Peds  Hematology   Anesthesia Other Findings   Reproductive/Obstetrics                            Anesthesia Physical Anesthesia Plan  ASA: III  Anesthesia Plan: General   Post-op Pain Management:    Induction: Intravenous  Airway Management Planned: LMA  Additional Equipment:   Intra-op Plan:   Post-operative Plan: Extubation in OR  Informed Consent: I have reviewed the patients History and Physical, chart, labs and discussed the procedure including the risks, benefits and alternatives for the proposed anesthesia with the patient or authorized representative who has indicated his/her understanding and acceptance.   Dental advisory given  Plan Discussed with:   Anesthesia Plan Comments:         Anesthesia Quick Evaluation

## 2014-12-31 NOTE — Transfer of Care (Signed)
Immediate Anesthesia Transfer of Care Note  Patient: Jill Garza [facility]  Procedure(s) Performed: Procedure(s): LEFT BREAST LUMPECTOMY WITH RADIOACTIVE SEED LOCALIZATION (Left)  Patient Location: PACU  Anesthesia Type:General  Level of Consciousness: awake, alert , oriented and patient cooperative  Airway & Oxygen Therapy: Patient Spontanous Breathing and Patient connected to face mask oxygen  Post-op Assessment: Report given to RN and Post -op Vital signs reviewed and stable  Post vital signs: Reviewed and stable  Last Vitals:  Filed Vitals:   12/31/14 0707  BP: 123/82  Pulse: 72  Temp: 36.8 C  Resp: 20    Complications: No apparent anesthesia complications

## 2015-01-01 ENCOUNTER — Encounter (HOSPITAL_BASED_OUTPATIENT_CLINIC_OR_DEPARTMENT_OTHER): Payer: Self-pay | Admitting: Surgery

## 2015-01-08 ENCOUNTER — Encounter: Payer: Medicare Other | Admitting: Nurse Practitioner

## 2015-01-11 DIAGNOSIS — L03113 Cellulitis of right upper limb: Secondary | ICD-10-CM | POA: Diagnosis not present

## 2015-01-23 ENCOUNTER — Other Ambulatory Visit: Payer: Medicare Other

## 2015-01-23 DIAGNOSIS — E119 Type 2 diabetes mellitus without complications: Secondary | ICD-10-CM

## 2015-01-23 DIAGNOSIS — I10 Essential (primary) hypertension: Secondary | ICD-10-CM | POA: Diagnosis not present

## 2015-01-23 DIAGNOSIS — E785 Hyperlipidemia, unspecified: Secondary | ICD-10-CM

## 2015-01-24 LAB — CBC WITH DIFFERENTIAL/PLATELET
BASOS: 0 %
Basophils Absolute: 0 10*3/uL (ref 0.0–0.2)
EOS (ABSOLUTE): 0.1 10*3/uL (ref 0.0–0.4)
EOS: 2 %
HEMATOCRIT: 40.6 % (ref 34.0–46.6)
HEMOGLOBIN: 13.4 g/dL (ref 11.1–15.9)
IMMATURE GRANULOCYTES: 0 %
Immature Grans (Abs): 0 10*3/uL (ref 0.0–0.1)
LYMPHS ABS: 2.6 10*3/uL (ref 0.7–3.1)
Lymphs: 48 %
MCH: 29.3 pg (ref 26.6–33.0)
MCHC: 33 g/dL (ref 31.5–35.7)
MCV: 89 fL (ref 79–97)
MONOCYTES: 6 %
MONOS ABS: 0.3 10*3/uL (ref 0.1–0.9)
NEUTROS PCT: 44 %
Neutrophils Absolute: 2.4 10*3/uL (ref 1.4–7.0)
Platelets: 260 10*3/uL (ref 150–379)
RBC: 4.57 x10E6/uL (ref 3.77–5.28)
RDW: 13.6 % (ref 12.3–15.4)
WBC: 5.4 10*3/uL (ref 3.4–10.8)

## 2015-01-24 LAB — COMPREHENSIVE METABOLIC PANEL
ALK PHOS: 69 IU/L (ref 39–117)
ALT: 10 IU/L (ref 0–32)
AST: 13 IU/L (ref 0–40)
Albumin/Globulin Ratio: 1.6 (ref 1.1–2.5)
Albumin: 4.2 g/dL (ref 3.6–4.8)
BILIRUBIN TOTAL: 0.3 mg/dL (ref 0.0–1.2)
BUN/Creatinine Ratio: 21 (ref 11–26)
BUN: 23 mg/dL (ref 8–27)
CHLORIDE: 102 mmol/L (ref 97–108)
CO2: 20 mmol/L (ref 18–29)
Calcium: 10.5 mg/dL — ABNORMAL HIGH (ref 8.7–10.3)
Creatinine, Ser: 1.12 mg/dL — ABNORMAL HIGH (ref 0.57–1.00)
GFR calc Af Amer: 59 mL/min/{1.73_m2} — ABNORMAL LOW (ref >59)
GFR calc non Af Amer: 51 mL/min/{1.73_m2} — ABNORMAL LOW (ref >59)
GLUCOSE: 110 mg/dL — AB (ref 65–99)
Globulin, Total: 2.7 g/dL (ref 1.5–4.5)
POTASSIUM: 4.5 mmol/L (ref 3.5–5.2)
Sodium: 140 mmol/L (ref 134–144)
Total Protein: 6.9 g/dL (ref 6.0–8.5)

## 2015-01-24 LAB — TSH: TSH: 1.22 u[IU]/mL (ref 0.450–4.500)

## 2015-01-24 LAB — LIPID PANEL
CHOLESTEROL TOTAL: 168 mg/dL (ref 100–199)
Chol/HDL Ratio: 3.9 ratio units (ref 0.0–4.4)
HDL: 43 mg/dL (ref >39)
LDL Calculated: 68 mg/dL (ref 0–99)
Triglycerides: 287 mg/dL — ABNORMAL HIGH (ref 0–149)
VLDL CHOLESTEROL CAL: 57 mg/dL — AB (ref 5–40)

## 2015-01-24 LAB — HEMOGLOBIN A1C
ESTIMATED AVERAGE GLUCOSE: 143 mg/dL
HEMOGLOBIN A1C: 6.6 % — AB (ref 4.8–5.6)

## 2015-01-27 ENCOUNTER — Ambulatory Visit: Payer: Medicare Other | Admitting: Gastroenterology

## 2015-01-28 DIAGNOSIS — H43811 Vitreous degeneration, right eye: Secondary | ICD-10-CM | POA: Diagnosis not present

## 2015-01-29 ENCOUNTER — Encounter: Payer: Self-pay | Admitting: Nurse Practitioner

## 2015-01-29 ENCOUNTER — Ambulatory Visit (INDEPENDENT_AMBULATORY_CARE_PROVIDER_SITE_OTHER): Payer: Medicare Other | Admitting: Nurse Practitioner

## 2015-01-29 VITALS — BP 116/86 | HR 79 | Temp 98.5°F | Resp 20 | Ht 65.0 in | Wt 208.0 lb

## 2015-01-29 DIAGNOSIS — M8580 Other specified disorders of bone density and structure, unspecified site: Secondary | ICD-10-CM | POA: Diagnosis not present

## 2015-01-29 DIAGNOSIS — Z0000 Encounter for general adult medical examination without abnormal findings: Secondary | ICD-10-CM | POA: Diagnosis not present

## 2015-01-29 DIAGNOSIS — E119 Type 2 diabetes mellitus without complications: Secondary | ICD-10-CM | POA: Diagnosis not present

## 2015-01-29 DIAGNOSIS — E785 Hyperlipidemia, unspecified: Secondary | ICD-10-CM

## 2015-01-29 MED ORDER — METFORMIN HCL 500 MG PO TABS: 500.0000 mg | ORAL_TABLET | Freq: Two times a day (BID) | ORAL | Status: DC

## 2015-01-29 MED ORDER — CLINDAMYCIN HCL 300 MG PO CAPS: 600.0000 mg | ORAL_CAPSULE | Freq: Once | ORAL | Status: DC

## 2015-01-29 NOTE — Progress Notes (Signed)
Patient ID: Jill Garza, female   DOB: [birth year], 64 y.o.   MRN: [id number]    PCP: Lauree Chandler, NP  Advanced Directive information    Allergies  Allergen Reactions  . Penicillins     Rash   . Ivp Dye [Iodinated Diagnostic Agents]     Hives   . Percocet [Oxycodone-Acetaminophen]     Itching     Chief Complaint  Patient presents with  . Annual Exam    Annual exam  . Medical Management of Chronic Issues    Hypertension, DM, Hyperlipidemia     HPI: Patient is a 64 y.o. female seen in the office today for annual wellness exam. Has been doing well. No acute concerns today.  Screenings: Colon Cancer- colonoscopy due- had to reschedule due to time mix up Breast Cancer- mammogram 2016, lumpectomy 4 weeks ago on left breast  Cervical Cancer- s/p vaginal hysterotomy. No complaints.   Osteoporosis- Dexa Scan - 2015- showing osteopenia not taking calcium or vit d  Depression screening Depression screen [facility] 2/9 12/17/2013  Decreased Interest 0  Down, Depressed, Hopeless 0  PHQ - 2 Score 0   Falls Fall Risk  01/29/2015 06/24/2014 12/17/2013  Falls in the past year? No No No   MMSE MMSE - Mini Mental State Exam 01/29/2015  Orientation to time 5  Orientation to Place 5  Registration 3  Attention/ Calculation 5  Recall 1  Language- name 2 objects 2  Language- repeat 1  Language- follow 3 step command 3  Language- read & follow direction 1  Write a sentence 1  Copy design 1  Total score 28   Vaccines Immunization History  Administered Date(s) Administered  . Influenza Whole 01/26/2013  . Influenza-Unspecified 01/28/2014  . Pneumococcal Conjugate-13 12/17/2013  . Pneumococcal Polysaccharide-23 04/09/2013  . Tdap 08/17/2009  . Zoster 01/09/2011  plans to get influenza vaccine at work  Smoking status:. 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  HENT: Negative for congestion and hearing loss.   Eyes: Negative.   Respiratory: Negative for cough and shortness of breath.   Cardiovascular: Negative for chest pain, palpitations and leg swelling.  Gastrointestinal: Negative for abdominal pain, diarrhea and constipation.  Genitourinary: Negative for dysuria and difficulty urinating.  Musculoskeletal: Negative for myalgias and arthralgias.  Skin: Negative for color change and wound.  Neurological: Negative for dizziness and weakness.  Psychiatric/Behavioral: Negative for behavioral problems, confusion and agitation.    Past Medical History  Diagnosis Date  . Hypertension   . Type 2 diabetes mellitus   . Hypercholesterolemia   . GERD (gastroesophageal reflux disease)   . Depression   . Sweat, sweating, excessive   . History of hiatal hernia   . Arthritis    Past Surgical History  Procedure Laterality Date  . Total knee arthroplasty Left 2012  . Total knee arthroplasty Right 2008  . Abdominal hysterectomy  1979  . Appendectomy  1964  . Tonsillectomy  1962  . Carpal tunnel release Right 1977  . Carpal tunnel release Left 2007  . Colonoscopy  2009  . Cataract extraction Left 04/13/2014    Dr. Herbert Deaner  . Cataract extraction Right 06/10/2014    Dr. Herbert Deaner  . Joint replacement    . Breast lumpectomy with radioactive seed localization Left 12/31/2014    Procedure: LEFT BREAST LUMPECTOMY WITH RADIOACTIVE SEED LOCALIZATION;  Surgeon: Donnie Mesa, MD;  Location: [facility];  Service: General;  Laterality: Left;   Social History:   reports that she quit smoking  about 28 years ago. She has never used smokeless tobacco. She reports that she does not drink alcohol or use illicit drugs.  Family History  Problem Relation Age of Onset  . Heart attack Father   . Pulmonary embolism Mother     Multiple, B/L   . Hypertension Brother   . Heart disease Brother   . Diabetes Brother   . High Cholesterol Brother   . Hypertension Brother   . High Cholesterol Brother   . Hypertension Sister   . Diabetes type II Sister   . High Cholesterol Sister   . Alcohol abuse Brother   . Hepatitis Brother   . Reye's syndrome Daughter   . Stroke    . Cancer Maternal Grandmother 66    Medications: Patient's Medications  New Prescriptions   No medications on file  Previous Medications   ASPIRIN EC 81 MG TABLET    Take 1 tablet (81 mg total) by mouth daily.   CLINDAMYCIN (CLEOCIN) 300 MG CAPSULE    Take 1 capsule (300 mg total) by mouth 3 (three) times daily.   LOSARTAN-HYDROCHLOROTHIAZIDE (HYZAAR) 100-12.5 MG PER TABLET    TAKE ONE TABLET BY MOUTH ONCE DAILY   METFORMIN (GLUCOPHAGE) 500 MG TABLET    Take 1 tablet (500 mg total) by mouth daily with breakfast.   OMEPRAZOLE (PRILOSEC) 20 MG CAPSULE    TAKE ONE CAPSULE BY MOUTH ONCE DAILY   PAROXETINE (PAXIL) 30 MG TABLET    Take 1 tablet (30 mg total) by mouth daily.   SIMVASTATIN (ZOCOR) 10 MG TABLET    Take 2 tablets (20 mg total) by mouth daily.  Modified Medications   No medications on file  Discontinued Medications   CHOLECALCIFEROL (VITAMIN D-3) 1000 UNITS CAPS    Take by mouth daily.   HYDROCODONE-ACETAMINOPHEN (NORCO/VICODIN) 5-325 MG PER TABLET    Take 1 tablet by mouth every 4 (four) hours as needed.     Physical Exam:  Filed Vitals:   01/29/15 1334  BP: 116/86  Pulse: 79  Temp: 98.5 F (36.9 C)  TempSrc: Oral  Resp: 20  Height: 5\' 5"  (1.651 m)  Weight: 208 lb (94.348 kg)  SpO2: 97%   Body mass index is 34.61 kg/(m^2).  Physical Exam  Constitutional: She is oriented to person, place, and  time. She appears well-developed and well-nourished. No distress.  HENT:  Head: Normocephalic and atraumatic.  Mouth/Throat: Oropharynx is clear and moist. No oropharyngeal exudate.  Eyes: Conjunctivae are normal. Pupils are equal, round, and reactive to light.  Neck: Normal range of motion. Neck supple.  Cardiovascular: Normal rate, regular rhythm, normal heart sounds and intact distal pulses.   Pulmonary/Chest: Effort normal and breath sounds normal.  Abdominal: Soft. Bowel sounds are normal. She exhibits no distension. There is no tenderness.  Musculoskeletal: Normal range of motion. She exhibits no edema or tenderness.  Neurological: She is alert and oriented to person, place, and time. No cranial nerve deficit. Coordination normal.  Skin: Skin is warm and dry. She is not diaphoretic.  Psychiatric: She has a normal mood and affect. Her behavior is normal. Thought content normal.    Labs reviewed: Basic Metabolic Panel:  Recent Labs  06/19/14 0818 12/30/14 1400 01/23/15 0821  NA 139 138 140  K 4.2 4.0 4.5  CL 101 102 102  CO2 24 29 20   GLUCOSE 122* 97 110*  BUN 12 14 23   CREATININE 1.00 1.01* 1.12*  CALCIUM 10.2 10.3 10.5*  TSH  --   --  1.220   Liver Function Tests:  Recent Labs  06/19/14 0818 01/23/15 0821  AST 23 13  ALT 19 10  ALKPHOS 64 69  BILITOT 0.4 0.3  PROT 7.0 6.9   No results for input(s): LIPASE, AMYLASE in the last 8760 hours. No results for input(s): AMMONIA in the last 8760 hours. CBC:  Recent Labs  12/31/14 0740 01/23/15 0821  WBC  --  5.4  NEUTROABS  --  2.4  HGB 13.6  --   HCT  --  40.6   Lipid Panel:  Recent Labs  06/19/14 0818 01/23/15 0821  CHOL 131 168  HDL 52 43  LDLCALC 56 68  TRIG 113 287*  CHOLHDL 2.5 3.9   TSH:  Recent Labs  01/23/15 0821  TSH 1.220   A1C: Lab Results  Component Value Date   HGBA1C 6.6* 01/23/2015     Assessment/Plan 1. DM type 2 goal A1C below 7.5 -A1c at goal however elevated  triglycerides noted -cont metformin, will increase to twice daily -discussed exercise and diet modifications - metFORMIN (GLUCOPHAGE) 500 MG tablet; Take 1 tablet (500 mg total) by mouth 2 (two) times daily with a meal.  Dispense: 90 tablet; Refill: 3  2. Osteopenia -pt not taking calcium or vit d supplement, educated regarding need and dexa report -to take calcium with vit d daily   3. Hyperlipidemia with target LDL less than 100 -elevated triglycerides noted on lab, conts on zocor 20 mg daily - Lipid panel; Future  4. Medicare annual wellness visit, subsequent  The patient is doing well and no distinct problems were identified on exam. The patient was counseled regarding the appropriate use of alcohol, regular self-examination of the breasts on a monthly basis, prevention of dental and periodontal disease, diet, regular sustained exercise for at least 30 minutes 5 times per week, routine screening interval for mammogram as recommended by the [place] and ACOG, importance of regular PAP smears, tobacco use,  and recommended schedule for GI hemoccult testing, colonoscopy, cholesterol, thyroid and diabetes screening. -MMSE 28/30 -no falls, no depression noted -exercise reinforced as well as diet changes  Follow up in 3 months with lab work prior to visit   Jessica K. Harle Battiest  [facility] [phone] 8 am - 5 pm) [phone] (after hours)

## 2015-01-29 NOTE — Patient Instructions (Signed)
To start taking calcium with vit D for osteoporosis   Increase metformin to 500 mg twice daily  Follow up in 3 months with fasting lab work prior to visit

## 2015-01-29 NOTE — Progress Notes (Signed)
Passed clock test 

## 2015-01-30 ENCOUNTER — Encounter: Payer: Self-pay | Admitting: Nurse Practitioner

## 2015-02-04 ENCOUNTER — Encounter: Payer: Self-pay | Admitting: Nurse Practitioner

## 2015-02-05 ENCOUNTER — Other Ambulatory Visit: Payer: Self-pay | Admitting: Nurse Practitioner

## 2015-02-05 DIAGNOSIS — E785 Hyperlipidemia, unspecified: Secondary | ICD-10-CM

## 2015-02-05 MED ORDER — SIMVASTATIN 20 MG PO TABS: 20.0000 mg | ORAL_TABLET | Freq: Every day | ORAL | Status: DC

## 2015-02-12 ENCOUNTER — Encounter: Payer: Self-pay | Admitting: Physician Assistant

## 2015-02-12 ENCOUNTER — Ambulatory Visit (INDEPENDENT_AMBULATORY_CARE_PROVIDER_SITE_OTHER): Payer: Medicare Other | Admitting: Physician Assistant

## 2015-02-12 VITALS — BP 112/64 | HR 88 | Ht 64.0 in | Wt 213.0 lb

## 2015-02-12 DIAGNOSIS — K219 Gastro-esophageal reflux disease without esophagitis: Secondary | ICD-10-CM | POA: Diagnosis not present

## 2015-02-12 DIAGNOSIS — Z86010 Personal history of colonic polyps: Secondary | ICD-10-CM | POA: Diagnosis not present

## 2015-02-12 NOTE — Patient Instructions (Signed)

## 2015-02-12 NOTE — Progress Notes (Signed)
Patient ID: Jill Garza, female   DOB: May 16, 1950, 64 y.o.   MRN: [id number]   Subjective:    Patient ID: Jill Garza, female    DOB: 1950/08/29, 64 y.o.   MRN: [id number]  HPI Jill Garza is a pleasant 64 year old white female, new to GI, referred by Sherrie Mustache nurse practitioner/geriatric medicine. Patient comes in to discuss colonoscopy and possible EGD. She has history of adult-onset diabetes mellitus, depression, hypertension, and had a recent lumpectomy for benign disease. Patient relates having 1 prior colonoscopy done in 2006 while she was living in [place]. We do have a copy of that report and she was found to have 2 polyps both of which were removed, the descending colon polyp was a tubular adenoma and the larger rectal polyp a villous adenoma. She states she  believes she was told to follow-up in 5 years but did not. She has no current complaints of abdominal pain, , changes in bowel habits, melena or hematochezia. Her family history is negative for colon cancer. She also had one prior remote EGD (2001)and states she was told that she had a healing ulcer at that time and a large hiatal hernia. She was told to have follow-up at some point to "be sure she didn't develop Barrett's". She has remained on omeprazole 20 mg by mouth daily for many years. She says as long she takes her medication she does fine but is unable to come off of the medication with out quick recurrence of heartburn and indigestion. She says occasionally she'll have some mild dysphagia if she eats too fast but other than that has no complaints.  Review of Systems Pertinent positive and negative review of systems were noted in the above HPI section.  All other review of systems was otherwise negative.  Outpatient Encounter Prescriptions as of 02/12/2015  Medication Sig  . aspirin EC 81 MG tablet Take 1 tablet (81 mg total) by mouth daily.  . clindamycin (CLEOCIN) 300 MG capsule Take 2 capsules (600 mg total) by mouth  once. Prior to dental work  . losartan-hydrochlorothiazide (HYZAAR) 100-12.5 MG per tablet TAKE ONE TABLET BY MOUTH ONCE DAILY  . metFORMIN (GLUCOPHAGE) 1000 MG tablet Take 1,000 mg by mouth daily with breakfast.  . omeprazole (PRILOSEC) 20 MG capsule TAKE ONE CAPSULE BY MOUTH ONCE DAILY  . PARoxetine (PAXIL) 30 MG tablet Take 1 tablet (30 mg total) by mouth daily.  . simvastatin (ZOCOR) 20 MG tablet Take 1 tablet (20 mg total) by mouth daily.  . [DISCONTINUED] metFORMIN (GLUCOPHAGE) 500 MG tablet Take 1 tablet (500 mg total) by mouth 2 (two) times daily with a meal.   No facility-administered encounter medications on file as of 02/12/2015.   Allergies  Allergen Reactions  . Penicillins     Rash   . Ivp Dye [Iodinated Diagnostic Agents]     Hives   . Percocet [Oxycodone-Acetaminophen]     Itching    Patient Active Problem List   Diagnosis Date Noted  . Hiatal hernia with gastroesophageal reflux 06/24/2014  . Obesity 06/24/2014  . Colon polyp 12/17/2013  . Routine general medical examination at a health care facility 12/17/2013  . Pain in the chest 12/17/2013  . Osteopenia 07/16/2013  . Essential hypertension, benign 07/16/2013  . DM type 2 goal A1C below 7.5 04/09/2013  . Hyperlipidemia with target LDL less than 100 04/09/2013  . Unspecified vitamin D deficiency 04/09/2013  . Depression 04/09/2013   Social History   Social History  . Marital  Status: Married    Spouse Name: N/A  . Number of Children: N/A  . Years of Education: N/A   Occupational History  . Not on file.   Social History Main Topics  . Smoking status: Former Smoker -- 15 years    Quit date: 05/09/1986  . Smokeless tobacco: Never Used  . Alcohol Use: No  . Drug Use: No  . Sexual Activity: Not on file   Other Topics Concern  . Not on file   Social History Narrative    Jill Garza's family history includes Alcohol abuse in her brother; Cancer (age of onset: 66) in her maternal grandmother; Diabetes in  her brother; Diabetes type II in her sister; Heart attack in her father; Heart disease in her brother; Hepatitis in her brother; High Cholesterol in her brother, brother, and sister; Hypertension in her brother, brother, and sister; Pulmonary embolism in her mother; Reye's syndrome in her daughter; Stroke in an other family member.      Objective:    Filed Vitals:   02/12/15 1025  BP: 112/64  Pulse: 88    Physical Exam  well-developed older female in no acute distress, pleasant blood pressure 112/64 pulse 88 height 5 foot 4 weight 213, BMI 36.5. HEENT ;nontraumatic normocephalic EOMI PERRLA sclera anicteric, Cardiovascular; regular rate and rhythm with S1-S2 no murmur or gallop, Pulmonary; clear bilaterally Abdomen; soft nontender nondistended bowel sounds are active there is no palpable mass or hepatosplenomegaly, Rectal ;exam not done, Ext;no clubbing cyanosis or edema skin warm and dry, Neuropsych ;mood and affect appropriate       Assessment & Plan:  #1 64 yo female with  Hx of adenomatous colon polyps- overdue for follow up colonoscopy - last procedure 2006 in [place] #2 chronic GERD, large hiatal hernia by report - r/o Barretts #3 AODM  #4 HTN  #5 Depression  Plan; Continue omeprazole 20 mg po qam Schedule for colonoscopy and EGD with Dr Henrene Pastor - procedures discussed in detail with pt and she is agreeable to proceed.   Kanna Dafoe S Gracemarie Skeet PA-C 02/12/2015   Cc: Lauree Chandler, NP

## 2015-02-12 NOTE — Progress Notes (Signed)
Agree with initial assessment and plans as outlined 

## 2015-03-16 ENCOUNTER — Other Ambulatory Visit: Payer: Self-pay | Admitting: Nurse Practitioner

## 2015-03-18 DIAGNOSIS — H43811 Vitreous degeneration, right eye: Secondary | ICD-10-CM | POA: Diagnosis not present

## 2015-04-15 ENCOUNTER — Encounter: Payer: Self-pay | Admitting: Internal Medicine

## 2015-04-15 ENCOUNTER — Ambulatory Visit (AMBULATORY_SURGERY_CENTER): Payer: Medicare Other | Admitting: Internal Medicine

## 2015-04-15 VITALS — BP 117/76 | HR 70 | Temp 98.7°F | Resp 18 | Ht 64.0 in | Wt 213.0 lb

## 2015-04-15 DIAGNOSIS — K219 Gastro-esophageal reflux disease without esophagitis: Secondary | ICD-10-CM

## 2015-04-15 DIAGNOSIS — K635 Polyp of colon: Secondary | ICD-10-CM | POA: Diagnosis not present

## 2015-04-15 DIAGNOSIS — Z86010 Personal history of colonic polyps: Secondary | ICD-10-CM

## 2015-04-15 DIAGNOSIS — E669 Obesity, unspecified: Secondary | ICD-10-CM | POA: Diagnosis not present

## 2015-04-15 DIAGNOSIS — R1310 Dysphagia, unspecified: Secondary | ICD-10-CM | POA: Diagnosis not present

## 2015-04-15 DIAGNOSIS — E119 Type 2 diabetes mellitus without complications: Secondary | ICD-10-CM | POA: Diagnosis not present

## 2015-04-15 DIAGNOSIS — D122 Benign neoplasm of ascending colon: Secondary | ICD-10-CM

## 2015-04-15 DIAGNOSIS — I10 Essential (primary) hypertension: Secondary | ICD-10-CM | POA: Diagnosis not present

## 2015-04-15 LAB — GLUCOSE, CAPILLARY
GLUCOSE-CAPILLARY: 86 mg/dL (ref 65–99)
Glucose-Capillary: 103 mg/dL — ABNORMAL HIGH (ref 65–99)

## 2015-04-15 MED ORDER — SODIUM CHLORIDE 0.9 % IV SOLN: 500.0000 mL | INTRAVENOUS | Status: DC

## 2015-04-15 NOTE — Op Note (Signed)
[place]  [address] [place], [id number]   COLONOSCOPY PROCEDURE REPORT  PATIENT: Jill Garza, Jill Garza  MR#: [id number] BIRTHDATE: [birth year] , 23  yrs. old GENDER: female ENDOSCOPIST: Eustace Quail, MD REFERRED BY:.   Office . Sherrie Mustache NP PROCEDURE DATE:  04/15/2015 PROCEDURE:   Colonoscopy, surveillance and Colonoscopy with snare polypectomy X1 First Screening Colonoscopy - Avg.  risk and is 50 yrs.  old or older - No.  Prior Negative Screening - Now for repeat screening. N/A  History of Adenoma - Now for follow-up colonoscopy & has been > or = to 3 yrs.  Yes hx of adenoma.  Has been 3 or more years since last colonoscopy.  Polyps removed today? Yes ASA CLASS:   Class II INDICATIONS:Screening for colonic neoplasia and PH Colon Adenoma... Index examination 2006 [facility]) with tubular adenoma and villous adenoma. Overdue for follow-up MEDICATIONS: Propofol 300 mg IV and Monitored anesthesia care  DESCRIPTION OF PROCEDURE:   After the risks benefits and alternatives of the procedure were thoroughly explained, informed consent was obtained.  The digital rectal exam revealed no abnormalities of the rectum.   The LB [id number]  endoscope was introduced through the anus and advanced to the cecum, which was identified by both the appendix and ileocecal valve. No adverse events experienced.   The quality of the prep was excellent. (Suprep was used)  The instrument was then slowly withdrawn as the colon was fully examined. Estimated blood loss is zero unless otherwise noted in this procedure report.  COLON FINDINGS: A single polyp measuring 2 mm in size was found in the ascending colon.  A polypectomy was performed with a cold snare.  The resection was complete, the polyp tissue was completely retrieved and sent to histology.   There was moderate diverticulosis noted in the sigmoid colon.   The examination was otherwise normal.  Retroflexed views revealed  no abnormalities. The time to cecum = 3.7 Withdrawal time = 11.8   The scope was withdrawn and the procedure completed. COMPLICATIONS: There were no immediate complications.  ENDOSCOPIC IMPRESSION: 1.   Single polyp was found in the ascending colon; polypectomy was performed with a cold snare 2.   Moderate diverticulosis was noted in the sigmoid colon 3.   The examination was otherwise normal  RECOMMENDATIONS: 1.  Follow up colonoscopy in 5 years 2.  Upper endoscopy today (please see results)  eSigned:  Eustace Quail, MD 04/15/2015 1:53 PM   cc: The Patient    ; Sherrie Mustache NP

## 2015-04-15 NOTE — Op Note (Signed)
[place]  [address] [place], [address]   ENDOSCOPY PROCEDURE REPORT  PATIENT: Jill Garza, Jill Garza  MR#: [id number] BIRTHDATE: 1950/08/06 , 42  yrs. old GENDER: female ENDOSCOPIST: Eustace Quail, MD REFERRED BY:  .  Sherrie Mustache NP / Office PROCEDURE DATE:  04/15/2015 PROCEDURE:  EGD, diagnostic ASA CLASS:     Class II INDICATIONS:  history of esophageal reflux. Told elsewhere to "keep an eye on it" regarding Barrett's screening/surveillance?Marland Kitchen MEDICATIONS: Monitored anesthesia care and Propofol 100 mg IV TOPICAL ANESTHETIC: none  DESCRIPTION OF PROCEDURE: After the risks benefits and alternatives of the procedure were thoroughly explained, informed consent was obtained.  The LB [id number] endoscope was introduced through the mouth and advanced to the second portion of the duodenum , Without limitations.  The instrument was slowly withdrawn as the mucosa was fully examined.  EXAM: The esophagus and gastroesophageal junction were completely normal in appearance.  The stomach was entered and closely examined.The antrum, angularis, and lesser curvature were well visualized, including a retroflexed view of the cardia and fundus. The stomach wall was normally distensable.  The scope passed easily through the pylorus into the duodenum.  Retroflexed views revealed a hiatal hernia.     The scope was then withdrawn from the patient and the procedure completed.  COMPLICATIONS: There were no immediate complications.  ENDOSCOPIC IMPRESSION: 1. Normal EGD. No Barrett's esophagus 2. GERD  RECOMMENDATIONS: 1.  Anti-reflux regimen to be followed 2.  Continue PPI  (omeprazole) to control reflux symptoms 3. Return to the care of your primary provider. GI follow-up as needed  REPEAT EXAM:  eSigned:  Eustace Quail, MD 04/15/2015 1:57 PM    CC:The Patient; Sherrie Mustache NP

## 2015-04-15 NOTE — Progress Notes (Signed)
To recovery, report to Myers, RN, VSS. 

## 2015-04-15 NOTE — Patient Instructions (Signed)
Colon polyp x1 removed today and diverticulosis seen.  Handouts given on polyps,diverticulosis, high fiber diet, and GERD. Repeat colonoscopy in 5 years!! Thank you!   YOU HAD AN ENDOSCOPIC PROCEDURE TODAY AT [facility]:   Refer to the procedure report that was given to you for any specific questions about what was found during the examination.  If the procedure report does not answer your questions, please call your gastroenterologist to clarify.  If you requested that your care partner not be given the details of your procedure findings, then the procedure report has been included in a sealed envelope for you to review at your convenience later.  YOU SHOULD EXPECT: Some feelings of bloating in the abdomen. Passage of more gas than usual.  Walking can help get rid of the air that was put into your GI tract during the procedure and reduce the bloating. If you had a lower endoscopy (such as a colonoscopy or flexible sigmoidoscopy) you may notice spotting of blood in your stool or on the toilet paper. If you underwent a bowel prep for your procedure, you may not have a normal bowel movement for a few days.  Please Note:  You might notice some irritation and congestion in your nose or some drainage.  This is from the oxygen used during your procedure.  There is no need for concern and it should clear up in a day or so.  SYMPTOMS TO REPORT IMMEDIATELY:   Following lower endoscopy (colonoscopy or flexible sigmoidoscopy):  Excessive amounts of blood in the stool  Significant tenderness or worsening of abdominal pains  Swelling of the abdomen that is new, acute  Fever of 100F or higher   Following upper endoscopy (EGD)  Vomiting of blood or coffee ground material  New chest pain or pain under the shoulder blades  Painful or persistently difficult swallowing  New shortness of breath  Fever of 100F or higher  Rands, tarry-looking stools  For urgent or emergent issues, a  gastroenterologist can be reached at any hour by calling [phone].   DIET: Your first meal following the procedure should be a small meal and then it is ok to progress to your normal diet. Heavy or fried foods are harder to digest and may make you feel nauseous or bloated.  Likewise, meals heavy in dairy and vegetables can increase bloating.  Drink plenty of fluids but you should avoid alcoholic beverages for 24 hours.  ACTIVITY:  You should plan to take it easy for the rest of today and you should NOT DRIVE or use heavy machinery until tomorrow (because of the sedation medicines used during the test).    FOLLOW UP: Our staff will call the number listed on your records the next business day following your procedure to check on you and address any questions or concerns that you may have regarding the information given to you following your procedure. If we do not reach you, we will leave a message.  However, if you are feeling well and you are not experiencing any problems, there is no need to return our call.  We will assume that you have returned to your regular daily activities without incident.  If any biopsies were taken you will be contacted by phone or by letter within the next 1-3 weeks.  Please call us at [phone] if you have not heard about the biopsies in 3 weeks.    SIGNATURES/CONFIDENTIALITY: You and/or your care partner have signed paperwork which will  be entered into your electronic medical record.  These signatures attest to the fact that that the information above on your After Visit Summary has been reviewed and is understood.  Full responsibility of the confidentiality of this discharge information lies with you and/or your care-partner. 

## 2015-04-15 NOTE — Progress Notes (Signed)
Called to room to assist during endoscopic procedure.  Patient ID and intended procedure confirmed with present staff. Received instructions for my participation in the procedure from the performing physician.  

## 2015-04-16 ENCOUNTER — Telehealth: Payer: Self-pay

## 2015-04-16 NOTE — Telephone Encounter (Signed)
  Follow up Call-  Call back number 04/15/2015  Post procedure Call Back phone  # [phone]  Permission to leave phone message Yes     Patient questions:  Do you have a fever, pain , or abdominal swelling? No. Pain Score  0 *  Have you tolerated food without any problems? Yes.    Have you been able to return to your normal activities? Yes.    Do you have any questions about your discharge instructions: Diet   No. Medications  No. Follow up visit  No.  Do you have questions or concerns about your Care? No.  Actions: * If pain score is 4 or above: No action needed, pain <4.

## 2015-04-20 ENCOUNTER — Encounter: Payer: Self-pay | Admitting: Internal Medicine

## 2015-05-12 DIAGNOSIS — J209 Acute bronchitis, unspecified: Secondary | ICD-10-CM | POA: Diagnosis not present

## 2015-06-02 ENCOUNTER — Other Ambulatory Visit (INDEPENDENT_AMBULATORY_CARE_PROVIDER_SITE_OTHER): Payer: Medicare Other

## 2015-06-02 DIAGNOSIS — E785 Hyperlipidemia, unspecified: Secondary | ICD-10-CM

## 2015-06-02 DIAGNOSIS — I10 Essential (primary) hypertension: Secondary | ICD-10-CM | POA: Diagnosis not present

## 2015-06-03 LAB — LIPID PANEL
CHOL/HDL RATIO: 3.5 ratio (ref 0.0–4.4)
CHOLESTEROL TOTAL: 148 mg/dL (ref 100–199)
HDL: 42 mg/dL (ref >39)
LDL CALC: 55 mg/dL (ref 0–99)
Triglycerides: 254 mg/dL — ABNORMAL HIGH (ref 0–149)
VLDL Cholesterol Cal: 51 mg/dL — ABNORMAL HIGH (ref 5–40)

## 2015-06-03 LAB — BASIC METABOLIC PANEL
BUN / CREAT RATIO: 14 (ref 11–26)
BUN: 14 mg/dL (ref 8–27)
CHLORIDE: 99 mmol/L (ref 96–106)
CO2: 24 mmol/L (ref 18–29)
Calcium: 10.5 mg/dL — ABNORMAL HIGH (ref 8.7–10.3)
Creatinine, Ser: 1.02 mg/dL — ABNORMAL HIGH (ref 0.57–1.00)
GFR calc non Af Amer: 57 mL/min/{1.73_m2} — ABNORMAL LOW (ref >59)
GFR, EST AFRICAN AMERICAN: 66 mL/min/{1.73_m2} (ref >59)
Glucose: 121 mg/dL — ABNORMAL HIGH (ref 65–99)
POTASSIUM: 4.7 mmol/L (ref 3.5–5.2)
SODIUM: 139 mmol/L (ref 134–144)

## 2015-06-04 ENCOUNTER — Encounter: Payer: Self-pay | Admitting: Nurse Practitioner

## 2015-06-04 ENCOUNTER — Other Ambulatory Visit: Payer: Self-pay

## 2015-06-04 ENCOUNTER — Ambulatory Visit (INDEPENDENT_AMBULATORY_CARE_PROVIDER_SITE_OTHER): Payer: Medicare Other | Admitting: Nurse Practitioner

## 2015-06-04 VITALS — BP 104/80 | HR 101 | Temp 97.9°F | Resp 20 | Ht 64.0 in | Wt 217.8 lb

## 2015-06-04 DIAGNOSIS — I10 Essential (primary) hypertension: Secondary | ICD-10-CM

## 2015-06-04 DIAGNOSIS — F329 Major depressive disorder, single episode, unspecified: Secondary | ICD-10-CM

## 2015-06-04 DIAGNOSIS — E119 Type 2 diabetes mellitus without complications: Secondary | ICD-10-CM

## 2015-06-04 DIAGNOSIS — K219 Gastro-esophageal reflux disease without esophagitis: Secondary | ICD-10-CM

## 2015-06-04 DIAGNOSIS — F32A Depression, unspecified: Secondary | ICD-10-CM

## 2015-06-04 DIAGNOSIS — E785 Hyperlipidemia, unspecified: Secondary | ICD-10-CM

## 2015-06-04 DIAGNOSIS — E781 Pure hyperglyceridemia: Secondary | ICD-10-CM | POA: Diagnosis not present

## 2015-06-04 MED ORDER — FENOFIBRATE 145 MG PO TABS: 145.0000 mg | ORAL_TABLET | Freq: Every day | ORAL | Status: DC

## 2015-06-04 NOTE — Patient Instructions (Signed)
STOP zocor and start fenofibrate 145 mg daily for elevated triglycerides Limit sweets  Follow up in 3 months with Dr Eulas Post with fasting blood work prior to visit.

## 2015-06-04 NOTE — Progress Notes (Signed)
Patient ID: Jill Garza, female   DOB: [birth year], 65 y.o.   MRN: [id number]    PCP: Lauree Chandler, NP  Advanced Directive information Does patient have an advance directive?: No, Would patient like information on creating an advanced directive?: No - patient declined information  Allergies  Allergen Reactions  . Penicillins     Rash   . Ivp Dye [Iodinated Diagnostic Agents]     Hives   . Percocet [Oxycodone-Acetaminophen]     Itching     Chief Complaint  Patient presents with  . Medical Management of Chronic Issues    3 mo f/u W/ labs, has had a cough for a month     HPI: Patient is a 65 y.o. female seen in the office today for routine follow up. Pt with hx of dm, htn,  Eats a lot of sweets.  Does not check blood sugars at home, A1c at goal in September - metformin 1000 mg twice daily, attempts diabetic diet but likes sweets.  Has had a cold for the last month. Cough and congestion Has had prednisone, z-pack, mucinex, codeine cough syrup.  Just now started to feel like she is able to do something. Has felt bad for a while now feeling better Depression has been controlled on paxil GERD- controlled on omeprazole.    Review of Systems:  Review of Systems  Constitutional: Negative for activity change, appetite change, fatigue and unexpected weight change.  HENT: Negative for congestion and hearing loss.   Eyes: Negative.   Respiratory: Positive for cough. Negative for shortness of breath.   Cardiovascular: Negative for chest pain, palpitations and leg swelling.  Gastrointestinal: Negative for abdominal pain, diarrhea and constipation.  Genitourinary: Negative for dysuria and difficulty urinating.  Musculoskeletal: Negative for myalgias and arthralgias.  Skin: Negative for color change and wound.  Neurological: Negative for dizziness and weakness.  Psychiatric/Behavioral: Negative for behavioral problems, confusion and agitation.    Past Medical History    Diagnosis Date  . Hypertension   . Type 2 diabetes mellitus ([place])   . Hypercholesterolemia   . GERD (gastroesophageal reflux disease)   . Depression   . Sweat, sweating, excessive   . History of hiatal hernia   . Arthritis   . Cancer ([place])     melanoma- right leg   Past Surgical History  Procedure Laterality Date  . Total knee arthroplasty Left 2012  . Total knee arthroplasty Right 2008  . Abdominal hysterectomy  1979  . Appendectomy  1964  . Tonsillectomy  1962  . Carpal tunnel release Right 1977  . Carpal tunnel release Left 2007  . Colonoscopy  2009  . Cataract extraction Left 04/13/2014    Dr. Herbert Deaner  . Cataract extraction Right 06/10/2014    Dr. Herbert Deaner  . Joint replacement    . Breast lumpectomy with radioactive seed localization Left 12/31/2014    Procedure: LEFT BREAST LUMPECTOMY WITH RADIOACTIVE SEED LOCALIZATION;  Surgeon: Donnie Mesa, MD;  Location: [facility];  Service: General;  Laterality: Left;  Marland Kitchen Melanoma excision     Social History:   reports that she quit smoking about 29 years ago. She has never used smokeless tobacco. She reports that she does not drink alcohol or use illicit drugs.  Family History  Problem Relation Age of Onset  . Heart attack Father   . Pulmonary embolism Mother     Multiple, B/L   . Hypertension Brother   . Heart disease Brother   .  Diabetes Brother   . High Cholesterol Brother   . Hypertension Brother   . High Cholesterol Brother   . Hypertension Sister   . Diabetes type II Sister   . High Cholesterol Sister   . Alcohol abuse Brother   . Hepatitis Brother   . Reye's syndrome Daughter   . Stroke    . Cancer Maternal Grandmother 49  . Colon cancer Neg Hx   . Esophageal cancer Neg Hx   . Stomach cancer Neg Hx   . Rectal cancer Neg Hx     Medications: Patient's Medications  New Prescriptions   No medications on file  Previous Medications   CLINDAMYCIN (CLEOCIN) 300 MG CAPSULE    Take 2 capsules (600 mg  total) by mouth once. Prior to dental work   LOSARTAN-HYDROCHLOROTHIAZIDE (HYZAAR) 100-12.5 MG TABLET    TAKE ONE TABLET BY MOUTH ONCE DAILY   METFORMIN (GLUCOPHAGE) 1000 MG TABLET    Take 1,000 mg by mouth daily with breakfast.   OMEPRAZOLE (PRILOSEC) 20 MG CAPSULE    TAKE ONE CAPSULE BY MOUTH ONCE DAILY   PAROXETINE (PAXIL) 30 MG TABLET    Take 1 tablet (30 mg total) by mouth daily.   SIMVASTATIN (ZOCOR) 20 MG TABLET    Take 1 tablet (20 mg total) by mouth daily.  Modified Medications   No medications on file  Discontinued Medications   ASPIRIN EC 81 MG TABLET    Take 1 tablet (81 mg total) by mouth daily.     Physical Exam:  Filed Vitals:   06/04/15 1250  BP: 104/80  Pulse: 101  Temp: 97.9 F (36.6 C)  TempSrc: Oral  Resp: 20  Height: 5\' 4"  (1.626 m)  Weight: 217 lb 12.8 oz (98.793 kg)  SpO2: 96%   Body mass index is 37.37 kg/(m^2).  Physical Exam  Constitutional: She is oriented to person, place, and time. She appears well-developed and well-nourished. No distress.  HENT:  Head: Normocephalic and atraumatic.  Eyes: Conjunctivae are normal. Pupils are equal, round, and reactive to light.  Neck: Normal range of motion. Neck supple.  Cardiovascular: Normal rate, regular rhythm and normal heart sounds.   Pulmonary/Chest: Effort normal and breath sounds normal.  Abdominal: Soft. Bowel sounds are normal. She exhibits no distension. There is no tenderness.  Musculoskeletal: Normal range of motion. She exhibits no edema or tenderness.  Neurological: She is alert and oriented to person, place, and time.  Skin: Skin is warm and dry. She is not diaphoretic.  Psychiatric: She has a normal mood and affect.    Labs reviewed: Basic Metabolic Panel:  Recent Labs  12/30/14 1400 01/23/15 0821 06/02/15 0916  NA 138 140 139  K 4.0 4.5 4.7  CL 102 102 99  CO2 29 20 24   GLUCOSE 97 110* 121*  BUN 14 23 14   CREATININE 1.01* 1.12* 1.02*  CALCIUM 10.3 10.5* 10.5*  TSH  --  1.220   --    Liver Function Tests:  Recent Labs  06/19/14 0818 01/23/15 0821  AST 23 13  ALT 19 10  ALKPHOS 64 69  BILITOT 0.4 0.3  PROT 7.0 6.9  ALBUMIN 4.3 4.2   No results for input(s): LIPASE, AMYLASE in the last 8760 hours. No results for input(s): AMMONIA in the last 8760 hours. CBC:  Recent Labs  12/31/14 0740 01/23/15 0821  WBC  --  5.4  NEUTROABS  --  2.4  HGB 13.6  --   HCT  --  40.6  MCV  --  89  PLT  --  260   Lipid Panel:  Recent Labs  06/19/14 0818 01/23/15 0821 06/02/15 0916  CHOL 131 168 148  HDL 52 43 42  LDLCALC 56 68 55  TRIG 113 287* 254*  CHOLHDL 2.5 3.9 3.5   TSH:  Recent Labs  01/23/15 0821  TSH 1.220   A1C: Lab Results  Component Value Date   HGBA1C 6.6* 01/23/2015     Assessment/Plan 1. Essential hypertension, benign Blood pressure stable, cont current regimen.   2. DM type 2 goal A1C below 7.5 -A1c at goal in sept, to cont lifestyle modifications and work on limiting sweets.  -to cont metformin twice daily and will follow up A1c in 3 months  3. Depression Stable on paxil, to cont at this time  4. Gastroesophageal reflux disease, esophagitis presence not specified -stable on omprazole, to cont current medicaiton  5. Hypertriglyceridemia -ongoing elevation of triglycerides, will stop Zocor -cont heart healthy diet and to avoid sweets. - fenofibrate (TRICOR) 145 MG tablet; Take 1 tablet (145 mg total) by mouth daily.  Dispense: 30 tablet; Refill: 3 -follow up in 3 months with Dr Eulas Post with fasting lipids prior to [place]. Harle Battiest  [facility] [phone] 8 am - 5 pm) [phone] (after hours)

## 2015-06-05 ENCOUNTER — Telehealth: Payer: Self-pay | Admitting: *Deleted

## 2015-06-05 NOTE — Telephone Encounter (Signed)
Just a statement to bring my chart up to date. Since bilateral cataract surgery one year ago, my new yearly eye exam is moved to March each year. I have an appointment for my yearly exam on June 24, 2015. My exam is not over due.     Thank you,     Jill Garza

## 2015-06-24 DIAGNOSIS — H43813 Vitreous degeneration, bilateral: Secondary | ICD-10-CM | POA: Diagnosis not present

## 2015-06-24 DIAGNOSIS — E119 Type 2 diabetes mellitus without complications: Secondary | ICD-10-CM | POA: Diagnosis not present

## 2015-06-24 LAB — HM DIABETES EYE EXAM

## 2015-06-29 ENCOUNTER — Other Ambulatory Visit: Payer: Self-pay | Admitting: Nurse Practitioner

## 2015-07-13 ENCOUNTER — Other Ambulatory Visit: Payer: Self-pay | Admitting: Internal Medicine

## 2015-07-13 ENCOUNTER — Encounter: Payer: Self-pay | Admitting: Internal Medicine

## 2015-08-11 DIAGNOSIS — Z961 Presence of intraocular lens: Secondary | ICD-10-CM | POA: Diagnosis not present

## 2015-08-11 DIAGNOSIS — H43813 Vitreous degeneration, bilateral: Secondary | ICD-10-CM | POA: Diagnosis not present

## 2015-08-11 DIAGNOSIS — H04123 Dry eye syndrome of bilateral lacrimal glands: Secondary | ICD-10-CM | POA: Diagnosis not present

## 2015-08-11 DIAGNOSIS — H26491 Other secondary cataract, right eye: Secondary | ICD-10-CM | POA: Diagnosis not present

## 2015-08-31 ENCOUNTER — Other Ambulatory Visit: Payer: Self-pay | Admitting: Nurse Practitioner

## 2015-08-31 NOTE — Telephone Encounter (Signed)
Spoke with patient, patient would like to continue getting 500 mg bid vs 1000 qd. Patient requested 3 month supply

## 2015-09-02 ENCOUNTER — Other Ambulatory Visit: Payer: Medicare Other

## 2015-09-02 DIAGNOSIS — E785 Hyperlipidemia, unspecified: Secondary | ICD-10-CM

## 2015-09-02 DIAGNOSIS — E119 Type 2 diabetes mellitus without complications: Secondary | ICD-10-CM | POA: Diagnosis not present

## 2015-09-03 LAB — COMPREHENSIVE METABOLIC PANEL
ALBUMIN: 4.3 g/dL (ref 3.6–4.8)
ALK PHOS: 45 IU/L (ref 39–117)
ALT: 16 IU/L (ref 0–32)
AST: 19 IU/L (ref 0–40)
Albumin/Globulin Ratio: 1.5 (ref 1.2–2.2)
BILIRUBIN TOTAL: 0.2 mg/dL (ref 0.0–1.2)
BUN / CREAT RATIO: 15 (ref 12–28)
BUN: 19 mg/dL (ref 8–27)
CALCIUM: 10.5 mg/dL — AB (ref 8.7–10.3)
CHLORIDE: 101 mmol/L (ref 96–106)
CO2: 22 mmol/L (ref 18–29)
Creatinine, Ser: 1.24 mg/dL — ABNORMAL HIGH (ref 0.57–1.00)
GFR calc Af Amer: 52 mL/min/{1.73_m2} — ABNORMAL LOW (ref >59)
GFR calc non Af Amer: 45 mL/min/{1.73_m2} — ABNORMAL LOW (ref >59)
Globulin, Total: 2.9 g/dL (ref 1.5–4.5)
Glucose: 119 mg/dL — ABNORMAL HIGH (ref 65–99)
Potassium: 4.7 mmol/L (ref 3.5–5.2)
Sodium: 140 mmol/L (ref 134–144)
Total Protein: 7.2 g/dL (ref 6.0–8.5)

## 2015-09-03 LAB — LIPID PANEL
CHOLESTEROL TOTAL: 177 mg/dL (ref 100–199)
Chol/HDL Ratio: 3.5 ratio units (ref 0.0–4.4)
HDL: 50 mg/dL (ref >39)
LDL Calculated: 97 mg/dL (ref 0–99)
Triglycerides: 152 mg/dL — ABNORMAL HIGH (ref 0–149)
VLDL Cholesterol Cal: 30 mg/dL (ref 5–40)

## 2015-09-03 LAB — HEMOGLOBIN A1C
ESTIMATED AVERAGE GLUCOSE: 146 mg/dL
HEMOGLOBIN A1C: 6.7 % — AB (ref 4.8–5.6)

## 2015-09-04 ENCOUNTER — Encounter: Payer: Self-pay | Admitting: Internal Medicine

## 2015-09-04 ENCOUNTER — Ambulatory Visit (INDEPENDENT_AMBULATORY_CARE_PROVIDER_SITE_OTHER): Payer: Medicare Other | Admitting: Internal Medicine

## 2015-09-04 VITALS — BP 112/76 | HR 84 | Temp 98.0°F | Resp 20 | Ht 64.0 in | Wt 214.6 lb

## 2015-09-04 DIAGNOSIS — E785 Hyperlipidemia, unspecified: Secondary | ICD-10-CM

## 2015-09-04 DIAGNOSIS — F329 Major depressive disorder, single episode, unspecified: Secondary | ICD-10-CM | POA: Diagnosis not present

## 2015-09-04 DIAGNOSIS — J302 Other seasonal allergic rhinitis: Secondary | ICD-10-CM | POA: Diagnosis not present

## 2015-09-04 DIAGNOSIS — K219 Gastro-esophageal reflux disease without esophagitis: Secondary | ICD-10-CM

## 2015-09-04 DIAGNOSIS — H6591 Unspecified nonsuppurative otitis media, right ear: Secondary | ICD-10-CM

## 2015-09-04 DIAGNOSIS — Z87891 Personal history of nicotine dependence: Secondary | ICD-10-CM

## 2015-09-04 DIAGNOSIS — I10 Essential (primary) hypertension: Secondary | ICD-10-CM | POA: Diagnosis not present

## 2015-09-04 DIAGNOSIS — H7291 Unspecified perforation of tympanic membrane, right ear: Secondary | ICD-10-CM

## 2015-09-04 DIAGNOSIS — J209 Acute bronchitis, unspecified: Secondary | ICD-10-CM

## 2015-09-04 DIAGNOSIS — E119 Type 2 diabetes mellitus without complications: Secondary | ICD-10-CM

## 2015-09-04 DIAGNOSIS — F32A Depression, unspecified: Secondary | ICD-10-CM

## 2015-09-04 MED ORDER — METHYLPREDNISOLONE 4 MG PO TBPK: ORAL_TABLET | ORAL | Status: DC

## 2015-09-04 MED ORDER — CEFUROXIME AXETIL 500 MG PO TABS: 500.0000 mg | ORAL_TABLET | Freq: Two times a day (BID) | ORAL | Status: DC

## 2015-09-04 MED ORDER — GLIPIZIDE 5 MG PO TABS: 5.0000 mg | ORAL_TABLET | Freq: Two times a day (BID) | ORAL | Status: DC

## 2015-09-04 NOTE — Patient Instructions (Signed)
STOP metformin due to worsening kidney function  Start Glipizide 2 times daily  Use inhaler at least 2 times daily while taking ceftin  Recommend OTC plain claritin, allegra or zyrtec daily  Will call with lung function study appt  Continue other medications as ordered  Follow up in 3 mos for routine visit. Fasting labs prior to appt

## 2015-09-04 NOTE — Progress Notes (Signed)
Patient ID: Jill Garza, female   DOB: 1951-01-12, 65 y.o.   MRN: [id number]    Location:    [facility]   Place of Service:  OFFICE   Chief Complaint  Patient presents with  . Medical Management of Chronic Issues    3 month follow-up for medical management    HPI:  65 yo female seen today for f/u. She reports right ear pressure x few days with hearing loss. She has a cough x 2 weeks (previously had a cough from Dec --> March). No f/c. No HA or dizziness. No sick contacts. She quit smoking >20 yrs ago.  DM - BS not checked at home. No low BS reactions. She takes metformin. A1c 6.7%. Cr 1.24.  Hyperlipidemia - stable on fenofibrate. LDL 97  Depression - mood stable on paxil  GERD - stable on omeprazole  HTN - BP stable on losartan hct  Wheezing/cough - uses HFA prn. No formal PFTs. Past hx tobacco abuse  Past Medical History  Diagnosis Date  . Hypertension   . Type 2 diabetes mellitus ([place])   . Hypercholesterolemia   . GERD (gastroesophageal reflux disease)   . Depression   . Sweat, sweating, excessive   . History of hiatal hernia   . Arthritis   . Cancer ([place])     melanoma- right leg    Past Surgical History  Procedure Laterality Date  . Total knee arthroplasty Left 2012  . Total knee arthroplasty Right 2008  . Abdominal hysterectomy  1979  . Appendectomy  1964  . Tonsillectomy  1962  . Carpal tunnel release Right 1977  . Carpal tunnel release Left 2007  . Colonoscopy  2009  . Cataract extraction Left 04/13/2014    Dr. Herbert Deaner  . Cataract extraction Right 06/10/2014    Dr. Herbert Deaner  . Joint replacement    . Breast lumpectomy with radioactive seed localization Left 12/31/2014    Procedure: LEFT BREAST LUMPECTOMY WITH RADIOACTIVE SEED LOCALIZATION;  Surgeon: Donnie Mesa, MD;  Location: [facility];  Service: General;  Laterality: Left;  Marland Kitchen Melanoma excision      Patient Care Team: Lauree Chandler, NP as PCP - General (Nurse Practitioner)  Social  History   Social History  . Marital Status: Married    Spouse Name: N/A  . Number of Children: N/A  . Years of Education: N/A   Occupational History  . Not on file.   Social History Main Topics  . Smoking status: Former Smoker -- 15 years    Quit date: 05/09/1986  . Smokeless tobacco: Never Used  . Alcohol Use: No     Comment: yearly  . Drug Use: No  . Sexual Activity: Not on file   Other Topics Concern  . Not on file   Social History Narrative     reports that she quit smoking about 29 years ago. She has never used smokeless tobacco. She reports that she does not drink alcohol or use illicit drugs.  Allergies  Allergen Reactions  . Penicillins     Rash   . Ivp Dye [Iodinated Diagnostic Agents]     Hives   . Percocet [Oxycodone-Acetaminophen]     Itching     Medications: Patient's Medications  New Prescriptions   No medications on file  Previous Medications   CLINDAMYCIN (CLEOCIN) 300 MG CAPSULE    Take 2 capsules (600 mg total) by mouth once. Prior to dental work   FENOFIBRATE (TRICOR) 145 MG TABLET  Take 1 tablet (145 mg total) by mouth daily.   LOSARTAN-HYDROCHLOROTHIAZIDE (HYZAAR) 100-12.5 MG TABLET    TAKE ONE TABLET BY MOUTH ONCE DAILY   METFORMIN (GLUCOPHAGE) 500 MG TABLET    TAKE ONE TABLET BY MOUTH TWICE DAILY WITH A MEAL   OMEPRAZOLE (PRILOSEC) 20 MG CAPSULE    TAKE ONE CAPSULE BY MOUTH ONCE DAILY   PAROXETINE (PAXIL) 30 MG TABLET    TAKE ONE TABLET BY MOUTH ONCE DAILY  Modified Medications   No medications on file  Discontinued Medications   METFORMIN (GLUCOPHAGE) 1000 MG TABLET    Take 1,000 mg by mouth daily with breakfast.    Review of Systems  Respiratory: Positive for cough.   Psychiatric/Behavioral: Positive for dysphoric mood.  All other systems reviewed and are negative.   Filed Vitals:   09/04/15 1141  BP: 112/76  Pulse: 84  Temp: 98 F (36.7 C)  TempSrc: Oral  Resp: 20  Height: 5\' 4"  (1.626 m)  Weight: 214 lb 9.6 oz (97.342  kg)  SpO2: 97%   Body mass index is 36.82 kg/(m^2).  Physical Exam  Constitutional: She is oriented to person, place, and time. She appears well-developed and well-nourished.  HENT:  Mouth/Throat: Oropharynx is clear and moist. No oropharyngeal exudate.  Right TM red and bulging, with air fluid level. Left TM appears nml. No sinus TTP. Nares with enlarged grey turbinates. Oropharynx cobblestoning with redness but no exudate  Eyes: Pupils are equal, round, and reactive to light. No scleral icterus.  Neck: Neck supple. Carotid bruit is not present. No tracheal deviation present. No thyromegaly present.  Cardiovascular: Normal rate, regular rhythm, normal heart sounds and intact distal pulses.  Exam reveals no gallop and no friction rub.   No murmur heard. No LE edema b/l. no calf TTP.   Pulmonary/Chest: Effort normal. No stridor. No respiratory distress. She has wheezes (end expiratory wheezing). She has no rales.  Abdominal: Soft. Bowel sounds are normal. She exhibits no distension and no mass. There is no hepatomegaly. There is no tenderness. There is no rebound and no guarding.  Lymphadenopathy:    She has no cervical adenopathy.  Neurological: She is alert and oriented to person, place, and time.  Skin: Skin is warm and dry. No rash noted.  Psychiatric: She has a normal mood and affect. Her behavior is normal. Judgment and thought content normal.   Diabetic Foot Exam - Simple   Simple Foot Form  Diabetic Foot exam was performed with the following findings:  Yes 09/04/2015 12:11 PM  Visual Inspection  No deformities, no ulcerations, no other skin breakdown bilaterally:  Yes  Sensation Testing  Intact to touch and monofilament testing bilaterally:  Yes  Pulse Check  Posterior Tibialis and Dorsalis pulse intact bilaterally:  Yes  Comments       Labs reviewed: Appointment on 09/02/2015  Component Date Value Ref Range Status  . Cholesterol, Total 09/02/2015 177  100 - 199 mg/dL  Final  . Triglycerides 09/02/2015 152* 0 - 149 mg/dL Final  . HDL 09/02/2015 50  >39 mg/dL Final  . VLDL Cholesterol Cal 09/02/2015 30  5 - 40 mg/dL Final  . LDL Calculated 09/02/2015 97  0 - 99 mg/dL Final  . Chol/HDL Ratio 09/02/2015 3.5  0.0 - 4.4 ratio units Final   Comment:  T. Chol/HDL Ratio                                             Men  Women                               1/2 Avg.Risk  3.4    3.3                                   Avg.Risk  5.0    4.4                                2X Avg.Risk  9.6    7.1                                3X Avg.Risk 23.4   11.0   . Glucose 09/02/2015 119* 65 - 99 mg/dL Final  . BUN 09/02/2015 19  8 - 27 mg/dL Final  . Creatinine, Ser 09/02/2015 1.24* 0.57 - 1.00 mg/dL Final  . GFR calc non Af Amer 09/02/2015 45* >59 mL/min/1.73 Final  . GFR calc Af Amer 09/02/2015 52* >59 mL/min/1.73 Final  . BUN/Creatinine Ratio 09/02/2015 15  12 - 28 Final  . Sodium 09/02/2015 140  134 - 144 mmol/L Final  . Potassium 09/02/2015 4.7  3.5 - 5.2 mmol/L Final  . Chloride 09/02/2015 101  96 - 106 mmol/L Final  . CO2 09/02/2015 22  18 - 29 mmol/L Final  . Calcium 09/02/2015 10.5* 8.7 - 10.3 mg/dL Final  . Total Protein 09/02/2015 7.2  6.0 - 8.5 g/dL Final  . Albumin 09/02/2015 4.3  3.6 - 4.8 g/dL Final  . Globulin, Total 09/02/2015 2.9  1.5 - 4.5 g/dL Final  . Albumin/Globulin Ratio 09/02/2015 1.5  1.2 - 2.2 Final  . Bilirubin Total 09/02/2015 0.2  0.0 - 1.2 mg/dL Final  . Alkaline Phosphatase 09/02/2015 45  39 - 117 IU/L Final  . AST 09/02/2015 19  0 - 40 IU/L Final  . ALT 09/02/2015 16  0 - 32 IU/L Final  . Hgb A1c MFr Bld 09/02/2015 6.7* 4.8 - 5.6 % Final   Comment:          Pre-diabetes: 5.7 - 6.4          Diabetes: >6.4          Glycemic control for adults with diabetes: <7.0   . Est. average glucose Bld gHb Est-m* 09/02/2015 146   Final  Abstract on 07/09/2015  Component Date Value Ref Range Status  . HM Diabetic Eye  Exam 06/24/2015 No Retinopathy  No Retinopathy Final    No results found.   Assessment/Plan   ICD-9-CM ICD-10-CM   1. Otitis media, serous, TM rupture, right 381.4 H65.91     H72.91   2. Other seasonal allergic rhinitis 477.8 J30.2   3. Acute bronchitis, unspecified organism 466.0 J20.9 cefUROXime (CEFTIN) 500 MG tablet     methylPREDNISolone (MEDROL DOSEPAK) 4 MG TBPK tablet     Pulmonary function test  4. DM type 2 goal A1C below 7.5 250.00 E11.9 glipiZIDE (GLUCOTROL) 5 MG tablet  5. Essential hypertension, benign 401.1 I10   6. Gastroesophageal reflux disease, esophagitis presence not specified 530.81 K21.9   7. Hyperlipidemia with target LDL less than 100 272.4 E78.5   8. Depression 311 F32.9   9. History of tobacco abuse V15.82 Z87.891 Pulmonary function test   STOP metformin due to worsening kidney function  Start Glipizide 2 times daily  Use inhaler at least 2 times daily while taking ceftin  Recommend OTC plain claritin, allegra or zyrtec daily  Will call with lung function study appt  Continue other medications as ordered  Follow up in 3 mos for routine visit. Fasting labs prior to appt   [place] S. Perlie Gold  [facility] [address] [place], [place] [address] [phone] Cell (Monday-Friday 8 AM - 5 PM) [phone] After 5 PM and follow prompts

## 2015-09-11 ENCOUNTER — Other Ambulatory Visit: Payer: Self-pay | Admitting: Nurse Practitioner

## 2015-10-22 ENCOUNTER — Other Ambulatory Visit: Payer: Self-pay | Admitting: Nurse Practitioner

## 2015-11-14 ENCOUNTER — Other Ambulatory Visit: Payer: Self-pay | Admitting: Nurse Practitioner

## 2015-11-24 DIAGNOSIS — D1801 Hemangioma of skin and subcutaneous tissue: Secondary | ICD-10-CM | POA: Diagnosis not present

## 2015-11-24 DIAGNOSIS — D235 Other benign neoplasm of skin of trunk: Secondary | ICD-10-CM | POA: Diagnosis not present

## 2015-11-24 DIAGNOSIS — L814 Other melanin hyperpigmentation: Secondary | ICD-10-CM | POA: Diagnosis not present

## 2015-11-24 DIAGNOSIS — Z85820 Personal history of malignant melanoma of skin: Secondary | ICD-10-CM | POA: Diagnosis not present

## 2015-12-01 NOTE — Addendum Note (Signed)
Addended by: Jolanda Mccann A on: 12/01/2015 04:09 PM   Modules accepted: Orders  

## 2015-12-09 ENCOUNTER — Other Ambulatory Visit: Payer: Medicare Other

## 2015-12-09 DIAGNOSIS — E785 Hyperlipidemia, unspecified: Secondary | ICD-10-CM

## 2015-12-09 DIAGNOSIS — E119 Type 2 diabetes mellitus without complications: Secondary | ICD-10-CM | POA: Diagnosis not present

## 2015-12-09 LAB — BASIC METABOLIC PANEL
BUN: 23 mg/dL (ref 7–25)
CALCIUM: 10.4 mg/dL (ref 8.6–10.4)
CHLORIDE: 103 mmol/L (ref 98–110)
CO2: 25 mmol/L (ref 20–31)
CREATININE: 1.33 mg/dL — AB (ref 0.50–0.99)
Glucose, Bld: 124 mg/dL — ABNORMAL HIGH (ref 65–99)
Potassium: 4.8 mmol/L (ref 3.5–5.3)
Sodium: 138 mmol/L (ref 135–146)

## 2015-12-09 LAB — LIPID PANEL
Cholesterol: 168 mg/dL (ref 125–200)
HDL: 51 mg/dL (ref >=46)
LDL CALC: 92 mg/dL (ref <130)
Total CHOL/HDL Ratio: 3.3 Ratio (ref <=5.0)
Triglycerides: 126 mg/dL (ref <150)
VLDL: 25 mg/dL (ref <30)

## 2015-12-09 LAB — HEMOGLOBIN A1C
HEMOGLOBIN A1C: 6.5 % — AB (ref <5.7)
MEAN PLASMA GLUCOSE: 140 mg/dL

## 2015-12-09 LAB — ALT: ALT: 16 U/L (ref 6–29)

## 2015-12-10 ENCOUNTER — Other Ambulatory Visit: Payer: Self-pay

## 2015-12-10 DIAGNOSIS — R944 Abnormal results of kidney function studies: Secondary | ICD-10-CM

## 2015-12-11 ENCOUNTER — Encounter: Payer: Self-pay | Admitting: Internal Medicine

## 2015-12-11 ENCOUNTER — Ambulatory Visit (INDEPENDENT_AMBULATORY_CARE_PROVIDER_SITE_OTHER): Payer: Medicare Other | Admitting: Internal Medicine

## 2015-12-11 VITALS — BP 120/80 | HR 72 | Temp 97.6°F | Ht 64.0 in | Wt 218.2 lb

## 2015-12-11 DIAGNOSIS — J302 Other seasonal allergic rhinitis: Secondary | ICD-10-CM | POA: Diagnosis not present

## 2015-12-11 DIAGNOSIS — F329 Major depressive disorder, single episode, unspecified: Secondary | ICD-10-CM

## 2015-12-11 DIAGNOSIS — E785 Hyperlipidemia, unspecified: Secondary | ICD-10-CM

## 2015-12-11 DIAGNOSIS — K219 Gastro-esophageal reflux disease without esophagitis: Secondary | ICD-10-CM

## 2015-12-11 DIAGNOSIS — I10 Essential (primary) hypertension: Secondary | ICD-10-CM

## 2015-12-11 DIAGNOSIS — E1122 Type 2 diabetes mellitus with diabetic chronic kidney disease: Secondary | ICD-10-CM

## 2015-12-11 DIAGNOSIS — F32A Depression, unspecified: Secondary | ICD-10-CM

## 2015-12-11 NOTE — Progress Notes (Signed)
Patient ID: Jill Garza, female   DOB: 12/16/1950, 65 y.o.   MRN: [id number]    Location:  [facility] Place of Service: OFFICE  Chief Complaint  Patient presents with  . Medical Management of Chronic Issues    3 month follow up    HPI:  65 yo female seen today for f/u. No concerns today. Ear infection sx's resolved after tx last visit. She has fullness in right ear but no pain.   DM - BS not checked at home. Rare low BS reaction. She takes glipizide. metformin stopped due to worsening renal fxn. A1c 6.5%. Cr 1.33.  Hyperlipidemia - stable on fenofibrate. LDL 97  Depression - mood stable on paxil  GERD - stable on omeprazole  HTN - BP stable on losartan hct  Wheezing/cough - uses HFA prn. No formal PFTs. Past hx tobacco abuse  CKD - worsening renal fxn with last Cr 1.33. Renal US ordered  Past Medical History:  Diagnosis Date  . Arthritis   . Cancer ([place])    melanoma- right leg  . Depression   . GERD (gastroesophageal reflux disease)   . History of hiatal hernia   . Hypercholesterolemia   . Hypertension   . Sweat, sweating, excessive   . Type 2 diabetes mellitus ([place])     Past Surgical History:  Procedure Laterality Date  . ABDOMINAL HYSTERECTOMY  1979  . APPENDECTOMY  1964  . BREAST LUMPECTOMY WITH RADIOACTIVE SEED LOCALIZATION Left 12/31/2014   Procedure: LEFT BREAST LUMPECTOMY WITH RADIOACTIVE SEED LOCALIZATION;  Surgeon: Donnie Mesa, MD;  Location: [facility];  Service: General;  Laterality: Left;  . CARPAL TUNNEL RELEASE Right 1977  . CARPAL TUNNEL RELEASE Left 2007  . CATARACT EXTRACTION Left 04/13/2014   Dr. Herbert Deaner  . CATARACT EXTRACTION Right 06/10/2014   Dr. Herbert Deaner  . COLONOSCOPY  2009  . JOINT REPLACEMENT    . MELANOMA EXCISION    . TONSILLECTOMY  1962  . TOTAL KNEE ARTHROPLASTY Left 2012  . TOTAL KNEE ARTHROPLASTY Right 2008    Patient Care Team: Lauree Chandler, NP as PCP - General (Nurse Practitioner)  Social History   Social  History  . Marital status: Married    Spouse name: N/A  . Number of children: N/A  . Years of education: N/A   Occupational History  . Not on file.   Social History Main Topics  . Smoking status: Former Smoker    Years: 15.00    Quit date: 05/09/1986  . Smokeless tobacco: Never Used  . Alcohol use No     Comment: yearly  . Drug use: No  . Sexual activity: Not on file   Other Topics Concern  . Not on file   Social History Narrative  . No narrative on file     reports that she quit smoking about 29 years ago. She quit after 15.00 years of use. She has never used smokeless tobacco. She reports that she does not drink alcohol or use drugs.  Family History  Problem Relation Age of Onset  . Heart attack Father   . Pulmonary embolism Mother     Multiple, B/L   . Hypertension Brother   . Heart disease Brother   . Diabetes Brother   . High Cholesterol Brother   . Hypertension Brother   . High Cholesterol Brother   . Hypertension Sister   . Diabetes type II Sister   . High Cholesterol Sister   . Alcohol abuse Brother   .  Reye's syndrome Daughter   . Hepatitis Brother   . Stroke    . Cancer Maternal Grandmother 48  . Colon cancer Neg Hx   . Esophageal cancer Neg Hx   . Stomach cancer Neg Hx   . Rectal cancer Neg Hx    Family Status  Relation Status  . Father Deceased at age 75  . Mother Deceased at age 98  . Brother Alive  . Brother Alive  . Sister Alive  . Brother Alive  . Daughter Deceased at age 91  . Daughter Alive  . Daughter Alive  . Brother   .    Marland Kitchen Maternal Grandmother   . Neg Hx      Allergies  Allergen Reactions  . Penicillins     Rash   . Ivp Dye [Iodinated Diagnostic Agents]     Hives   . Percocet [Oxycodone-Acetaminophen]     Itching     Medications: Patient's Medications  New Prescriptions   No medications on file  Previous Medications   CLINDAMYCIN (CLEOCIN) 300 MG CAPSULE    Take 2 capsules (600 mg total) by mouth once. Prior to  dental work   FENOFIBRATE (TRICOR) 145 MG TABLET    TAKE ONE TABLET BY MOUTH ONCE DAILY   GLIPIZIDE (GLUCOTROL) 5 MG TABLET    Take 1 tablet (5 mg total) by mouth 2 (two) times daily before a meal.   LOSARTAN-HYDROCHLOROTHIAZIDE (HYZAAR) 100-12.5 MG TABLET    TAKE ONE TABLET BY MOUTH ONCE DAILY   OMEPRAZOLE (PRILOSEC) 20 MG CAPSULE    TAKE ONE CAPSULE BY MOUTH ONCE DAILY   PAROXETINE (PAXIL) 30 MG TABLET    TAKE ONE TABLET BY MOUTH ONCE DAILY  Modified Medications   No medications on file  Discontinued Medications   CEFUROXIME (CEFTIN) 500 MG TABLET    Take 1 tablet (500 mg total) by mouth 2 (two) times daily with a meal.   METHYLPREDNISOLONE (MEDROL DOSEPAK) 4 MG TBPK TABLET    Take as directed    Review of Systems  Constitutional: Negative for activity change, appetite change, chills, diaphoresis, fatigue and fever.  HENT: Negative for ear pain and sore throat.   Eyes: Negative for visual disturbance.  Respiratory: Negative for cough, chest tightness and shortness of breath.   Cardiovascular: Negative for chest pain, palpitations and leg swelling.  Gastrointestinal: Negative for abdominal pain, blood in stool, constipation, diarrhea, nausea and vomiting.  Genitourinary: Negative for dysuria.  Musculoskeletal: Negative for arthralgias.  Neurological: Negative for dizziness, tremors, numbness and headaches.  Psychiatric/Behavioral: Negative for sleep disturbance. The patient is not nervous/anxious.   All other systems reviewed and are negative.   Vitals:   12/11/15 1037  BP: 120/80  Pulse: 72  Temp: 97.6 F (36.4 C)  TempSrc: Oral  SpO2: 97%  Weight: 218 lb 3.2 oz (99 kg)  Height: '5\' 4"'$  (1.626 m)   Body mass index is 37.45 kg/m.  Physical Exam  Constitutional: She is oriented to person, place, and time. She appears well-developed and well-nourished.  HENT:  Mouth/Throat: Oropharynx is clear and moist. No oropharyngeal exudate.  TMs appear nml  Eyes: Pupils are equal,  round, and reactive to light. No scleral icterus.  Neck: Neck supple. Carotid bruit is not present. No tracheal deviation present. No thyromegaly present.  Cardiovascular: Normal rate, regular rhythm, normal heart sounds and intact distal pulses.  Exam reveals no gallop and no friction rub.   No murmur heard. No LE edema b/l. no calf TTP.  Pulmonary/Chest: Effort normal and breath sounds normal. No stridor. No respiratory distress. She has no wheezes. She has no rales.  Abdominal: Soft. Bowel sounds are normal. She exhibits no distension and no mass. There is no hepatomegaly. There is no tenderness. There is no rebound and no guarding.  Musculoskeletal: She exhibits edema.  Lymphadenopathy:    She has no cervical adenopathy.  Neurological: She is alert and oriented to person, place, and time. She has normal reflexes.  Skin: Skin is warm and dry. No rash noted.  Psychiatric: She has a normal mood and affect. Her behavior is normal. Judgment and thought content normal.   Diabetic Foot Exam - Simple   Simple Foot Form Diabetic Foot exam was performed with the following findings:  Yes 12/11/2015 11:02 AM  Visual Inspection No deformities, no ulcerations, no other skin breakdown bilaterally:  Yes Sensation Testing Intact to touch and monofilament testing bilaterally:  Yes Pulse Check Posterior Tibialis and Dorsalis pulse intact bilaterally:  Yes Comments      Labs reviewed: Appointment on 12/09/2015  Component Date Value Ref Range Status  . Sodium 12/09/2015 138  135 - 146 mmol/L Final  . Potassium 12/09/2015 4.8  3.5 - 5.3 mmol/L Final  . Chloride 12/09/2015 103  98 - 110 mmol/L Final  . CO2 12/09/2015 25  20 - 31 mmol/L Final  . Glucose, Bld 12/09/2015 124* 65 - 99 mg/dL Final  . BUN 12/09/2015 23  7 - 25 mg/dL Final  . Creat 12/09/2015 1.33* 0.50 - 0.99 mg/dL Final   Comment:   For patients > or = 67 years of age: The upper reference limit for Creatinine is approximately 13%  higher for people identified as African-American.     . Calcium 12/09/2015 10.4  8.6 - 10.4 mg/dL Final  . ALT 12/09/2015 16  6 - 29 U/L Final  . Hgb A1c MFr Bld 12/09/2015 6.5* <5.7 % Final   Comment:   For someone without known diabetes, a hemoglobin A1c value of 6.5% or greater indicates that they may have diabetes and this should be confirmed with a follow-up test.   For someone with known diabetes, a value <7% indicates that their diabetes is well controlled and a value greater than or equal to 7% indicates suboptimal control. A1c targets should be individualized based on duration of diabetes, age, comorbid conditions, and other considerations.   Currently, no consensus exists for use of hemoglobin A1c for diagnosis of diabetes for children.     . Mean Plasma Glucose 12/09/2015 140  mg/dL Final  . Cholesterol 12/09/2015 168  125 - 200 mg/dL Final  . Triglycerides 12/09/2015 126  <150 mg/dL Final  . HDL 12/09/2015 51  >=46 mg/dL Final  . Total CHOL/HDL Ratio 12/09/2015 3.3  <=5.0 Ratio Final  . VLDL 12/09/2015 25  <30 mg/dL Final  . LDL Cholesterol 12/09/2015 92  <130 mg/dL Final   Comment:   Total Cholesterol/HDL Ratio:CHD Risk                        Coronary Heart Disease Risk Table                                        Men       Women          1/2 Average Risk  3.4        3.3              Average Risk              5.0        4.4           2X Average Risk              9.6        7.1           3X Average Risk             23.4       11.0 Use the calculated Patient Ratio above and the CHD Risk table  to determine the patient's CHD Risk.     No results found.   Assessment/Plan   ICD-9-CM ICD-10-CM   1. Other seasonal allergic rhinitis 477.8 J30.2   2. Controlled type 2 diabetes mellitus with chronic kidney disease, without long-term current use of insulin, unspecified CKD stage (HCC) 250.40 E11.22 CMP with eGFR   585.9  Microalbumin/Creatinine Ratio,  Urine     Urinalysis with Reflex Microscopic     Hemoglobin A1c  3. Essential hypertension, benign 401.1 I10 CBC with Differential/Platelets     Urinalysis with Reflex Microscopic  4. Hyperlipidemia with target LDL less than 100 272.4 E78.5 Lipid Panel     TSH  5. Depression 311 F32.9   6. Gastroesophageal reflux disease, esophagitis presence not specified 530.81 K21.9     May consider changing losartan hct -->bystolic '5mg'$  and losartan '25mg'$   if renal fxn continues to worsen.  Continue current medications as ordered  Will call with ultrasound results  Follow up in 3 mos for CPE. Fasting labs prior to appt  [place] S. Perlie Gold  [facility] [address] [place], [place] [address] [phone] Cell (Monday-Friday 8 AM - 5 PM) [phone] After 5 PM and follow prompts

## 2015-12-11 NOTE — Patient Instructions (Addendum)
Continue current medications as ordered  Will call with ultrasound results  Follow up in 3 mos for CPE

## 2015-12-24 ENCOUNTER — Other Ambulatory Visit: Payer: Self-pay | Admitting: Nurse Practitioner

## 2015-12-25 ENCOUNTER — Ambulatory Visit
Admission: RE | Admit: 2015-12-25 | Discharge: 2015-12-25 | Disposition: A | Discharge status: Home / Self Care | Payer: Medicare Other | Source: Ambulatory Visit | Attending: Internal Medicine | Admitting: Internal Medicine

## 2015-12-25 DIAGNOSIS — R944 Abnormal results of kidney function studies: Secondary | ICD-10-CM

## 2015-12-25 DIAGNOSIS — N2889 Other specified disorders of kidney and ureter: Secondary | ICD-10-CM | POA: Diagnosis not present

## 2016-01-13 ENCOUNTER — Other Ambulatory Visit: Payer: Self-pay | Admitting: Nurse Practitioner

## 2016-01-23 ENCOUNTER — Other Ambulatory Visit: Payer: Self-pay | Admitting: Internal Medicine

## 2016-02-18 ENCOUNTER — Encounter: Payer: Self-pay | Admitting: Internal Medicine

## 2016-02-23 ENCOUNTER — Other Ambulatory Visit: Payer: Self-pay | Admitting: Internal Medicine

## 2016-03-14 ENCOUNTER — Other Ambulatory Visit: Payer: Medicare Other

## 2016-03-14 DIAGNOSIS — I10 Essential (primary) hypertension: Secondary | ICD-10-CM | POA: Diagnosis not present

## 2016-03-14 DIAGNOSIS — E785 Hyperlipidemia, unspecified: Secondary | ICD-10-CM | POA: Diagnosis not present

## 2016-03-14 DIAGNOSIS — E1122 Type 2 diabetes mellitus with diabetic chronic kidney disease: Secondary | ICD-10-CM

## 2016-03-14 LAB — CBC WITH DIFFERENTIAL/PLATELET
BASOS ABS: 0 {cells}/uL (ref 0–200)
Basophils Relative: 0 %
Eosinophils Absolute: 96 cells/uL (ref 15–500)
Eosinophils Relative: 2 %
HEMATOCRIT: 40 % (ref 35.0–45.0)
HEMOGLOBIN: 13.1 g/dL (ref 11.7–15.5)
LYMPHS ABS: 2208 {cells}/uL (ref 850–3900)
Lymphocytes Relative: 46 %
MCH: 28.7 pg (ref 27.0–33.0)
MCHC: 32.8 g/dL (ref 32.0–36.0)
MCV: 87.7 fL (ref 80.0–100.0)
MONO ABS: 288 {cells}/uL (ref 200–950)
MPV: 9.4 fL (ref 7.5–12.5)
Monocytes Relative: 6 %
NEUTROS PCT: 46 %
Neutro Abs: 2208 cells/uL (ref 1500–7800)
Platelets: 299 10*3/uL (ref 140–400)
RBC: 4.56 MIL/uL (ref 3.80–5.10)
RDW: 13.7 % (ref 11.0–15.0)
WBC: 4.8 10*3/uL (ref 3.8–10.8)

## 2016-03-14 LAB — TSH: TSH: 0.73 mIU/L

## 2016-03-15 LAB — COMPLETE METABOLIC PANEL WITH GFR
ALBUMIN: 4.3 g/dL (ref 3.6–5.1)
ALK PHOS: 45 U/L (ref 33–130)
ALT: 17 U/L (ref 6–29)
AST: 20 U/L (ref 10–35)
BUN: 20 mg/dL (ref 7–25)
CALCIUM: 10.1 mg/dL (ref 8.6–10.4)
CO2: 24 mmol/L (ref 20–31)
Chloride: 102 mmol/L (ref 98–110)
Creat: 1.09 mg/dL — ABNORMAL HIGH (ref 0.50–0.99)
GFR, EST AFRICAN AMERICAN: 61 mL/min (ref >=60)
GFR, EST NON AFRICAN AMERICAN: 53 mL/min — AB (ref >=60)
Glucose, Bld: 130 mg/dL — ABNORMAL HIGH (ref 65–99)
POTASSIUM: 4.3 mmol/L (ref 3.5–5.3)
Sodium: 138 mmol/L (ref 135–146)
Total Bilirubin: 0.5 mg/dL (ref 0.2–1.2)
Total Protein: 7 g/dL (ref 6.1–8.1)

## 2016-03-15 LAB — URINALYSIS, MICROSCOPIC ONLY
Bacteria, UA: NONE SEEN [HPF]
Casts: NONE SEEN [LPF]
Crystals: NONE SEEN [HPF]
Yeast: NONE SEEN [HPF]

## 2016-03-15 LAB — LIPID PANEL
CHOL/HDL RATIO: 3.8 ratio (ref <5.0)
CHOLESTEROL: 193 mg/dL (ref <200)
HDL: 51 mg/dL (ref >50)
LDL Cholesterol: 110 mg/dL — ABNORMAL HIGH
TRIGLYCERIDES: 162 mg/dL — AB (ref <150)
VLDL: 32 mg/dL — ABNORMAL HIGH (ref <30)

## 2016-03-15 LAB — URINALYSIS, ROUTINE W REFLEX MICROSCOPIC
Bilirubin Urine: NEGATIVE
Glucose, UA: NEGATIVE
Hgb urine dipstick: NEGATIVE
KETONES UR: NEGATIVE
NITRITE: NEGATIVE
Protein, ur: NEGATIVE
SPECIFIC GRAVITY, URINE: 1.023 (ref 1.001–1.035)
pH: 6.5 (ref 5.0–8.0)

## 2016-03-15 LAB — HEMOGLOBIN A1C
HEMOGLOBIN A1C: 6.1 % — AB (ref <5.7)
MEAN PLASMA GLUCOSE: 128 mg/dL

## 2016-03-15 LAB — MICROALBUMIN / CREATININE URINE RATIO
CREATININE, URINE: 215 mg/dL (ref 20–320)
Microalb Creat Ratio: 3 mcg/mg creat (ref <30)
Microalb, Ur: 0.7 mg/dL

## 2016-03-18 ENCOUNTER — Encounter: Payer: Medicare Other | Admitting: Internal Medicine

## 2016-03-18 ENCOUNTER — Encounter: Payer: Self-pay | Admitting: Internal Medicine

## 2016-03-18 ENCOUNTER — Ambulatory Visit (INDEPENDENT_AMBULATORY_CARE_PROVIDER_SITE_OTHER): Payer: Medicare Other | Admitting: Internal Medicine

## 2016-03-18 ENCOUNTER — Ambulatory Visit (INDEPENDENT_AMBULATORY_CARE_PROVIDER_SITE_OTHER): Payer: Medicare Other

## 2016-03-18 VITALS — BP 138/80 | HR 76 | Temp 98.3°F | Ht 64.0 in | Wt 221.0 lb

## 2016-03-18 DIAGNOSIS — E785 Hyperlipidemia, unspecified: Secondary | ICD-10-CM | POA: Diagnosis not present

## 2016-03-18 DIAGNOSIS — K219 Gastro-esophageal reflux disease without esophagitis: Secondary | ICD-10-CM

## 2016-03-18 DIAGNOSIS — J302 Other seasonal allergic rhinitis: Secondary | ICD-10-CM

## 2016-03-18 DIAGNOSIS — Z0000 Encounter for general adult medical examination without abnormal findings: Secondary | ICD-10-CM

## 2016-03-18 DIAGNOSIS — I10 Essential (primary) hypertension: Secondary | ICD-10-CM | POA: Diagnosis not present

## 2016-03-18 DIAGNOSIS — Z6837 Body mass index (BMI) 37.0-37.9, adult: Secondary | ICD-10-CM

## 2016-03-18 DIAGNOSIS — F32A Depression, unspecified: Secondary | ICD-10-CM

## 2016-03-18 DIAGNOSIS — F329 Major depressive disorder, single episode, unspecified: Secondary | ICD-10-CM | POA: Diagnosis not present

## 2016-03-18 DIAGNOSIS — N6489 Other specified disorders of breast: Secondary | ICD-10-CM | POA: Diagnosis not present

## 2016-03-18 DIAGNOSIS — E6609 Other obesity due to excess calories: Secondary | ICD-10-CM | POA: Diagnosis not present

## 2016-03-18 DIAGNOSIS — E1122 Type 2 diabetes mellitus with diabetic chronic kidney disease: Secondary | ICD-10-CM

## 2016-03-18 DIAGNOSIS — IMO0001 Reserved for inherently not codable concepts without codable children: Secondary | ICD-10-CM

## 2016-03-18 NOTE — Progress Notes (Signed)
Patient ID: Jill Garza, female   DOB: Apr 02, 1951, 65 y.o.   MRN: [id number]   Location:  [facility]  Place of Service:  OFFICE  Provider: Arletha Grippe, DO  Patient Care Team: Lauree Chandler, NP as PCP - General (Nurse Practitioner)  Extended Emergency Contact Information Primary Emergency Contact: [facility] Address: [address] [place], [place] [address] [name] of [place] Phone: [phone] Mobile Phone: [phone] Relation: Spouse  Code Status: FULL CODE Goals of Care: Advanced Directive information Advanced Directives 12/11/2015  Does patient have an advance directive? No  Does patient want to make changes to advanced directive? -  Would patient like information on creating an advanced directive? No - patient declined information     Chief Complaint  Patient presents with  . Medicare Wellness    yearly exam  . MMSE    30/30 passed clock drawing    HPI: Patient is a 65 y.o. female seen in today for an annual wellness exam.  She is c/a weight gain of 3 lbs in last 3 mos and palpitations  DM - BS not checked at home. Rare low BS reaction. She takes glipizide. metformin stopped due to worsening renal fxn. A1c 6.1%. Cr 1.09; urine microalbumin/Cr 3.0  Hyperlipidemia - stable on fenofibrate. LDL 110; TG 162  Depression - mood stable on paxil  GERD - stable on omeprazole  HTN - BP stable on losartan hct  Wheezing/cough - uses HFA prn. No formal PFTs. Past hx tobacco abuse  CKD - stable Cr 1.09.    Depression screen [facility] 2/9 03/18/2016 06/04/2015 12/17/2013  Decreased Interest 0 0 0  Down, Depressed, Hopeless 0 0 0  PHQ - 2 Score 0 0 0    Fall Risk  03/18/2016 12/11/2015 09/04/2015 06/04/2015 01/29/2015  Falls in the past year? No No No No No   MMSE - Mini Mental State Exam 03/18/2016 01/29/2015  Orientation to time 5 5  Orientation to Place 5 5  Registration 3 3  Attention/ Calculation 5 5  Recall 3 1  Language- name 2 objects 2 2    Language- repeat 1 1  Language- follow 3 step command 3 3  Language- read & follow direction 1 1  Write a sentence 1 1  Copy design 1 1  Total score 30 28     Health Maintenance  Topic Date Due  . OPHTHALMOLOGY EXAM  06/23/2016  . MAMMOGRAM  07/15/2016  . HEMOGLOBIN A1C  09/11/2016  . FOOT EXAM  12/10/2016  . PNA vac Low Risk Adult (2 of 2 - PPSV23) 04/09/2018  . TETANUS/TDAP  08/18/2019  . COLONOSCOPY  04/14/2020  . INFLUENZA VACCINE  Completed  . DEXA SCAN  Completed  . ZOSTAVAX  Completed  . Hepatitis C Screening  Completed    Urinary incontinence? No isuues  Functional Status Survey: Is the patient deaf or have difficulty hearing?: Yes Does the patient have difficulty seeing, even when wearing glasses/contacts?: No Does the patient have difficulty concentrating, remembering, or making decisions?: No Does the patient have difficulty walking or climbing stairs?: No Does the patient have difficulty dressing or bathing?: No Does the patient have difficulty doing errands alone such as visiting a doctor's office or shopping?: No  Exercise? No regular routine  Diet? Maintains healthy food choices  Vision Screening Comments: Patients last appointment with eye doctor was in March  Hearing: no issues    Dentition: noissues  Pain: none  Past Medical History:  Diagnosis Date  . Arthritis   . Cancer ([place])    melanoma- right leg  . Depression   . GERD (gastroesophageal reflux disease)   . History of hiatal hernia   . Hypercholesterolemia   . Hypertension   . Sweat, sweating, excessive   . Type 2 diabetes mellitus ([place])     Past Surgical History:  Procedure Laterality Date  . ABDOMINAL HYSTERECTOMY  1979  . APPENDECTOMY  1964  . BREAST LUMPECTOMY WITH RADIOACTIVE SEED LOCALIZATION Left 12/31/2014   Procedure: LEFT BREAST LUMPECTOMY WITH RADIOACTIVE SEED LOCALIZATION;  Surgeon: Donnie Mesa, MD;  Location: [facility];  Service: General;   Laterality: Left;  . CARPAL TUNNEL RELEASE Right 1977  . CARPAL TUNNEL RELEASE Left 2007  . CATARACT EXTRACTION Left 04/13/2014   Dr. Herbert Deaner  . CATARACT EXTRACTION Right 06/10/2014   Dr. Herbert Deaner  . COLONOSCOPY  2009  . JOINT REPLACEMENT    . MELANOMA EXCISION    . TONSILLECTOMY  1962  . TOTAL KNEE ARTHROPLASTY Left 2012  . TOTAL KNEE ARTHROPLASTY Right 2008    Family History  Problem Relation Age of Onset  . Heart attack Father   . Pulmonary embolism Mother     Multiple, B/L   . Hypertension Brother   . Heart disease Brother   . Diabetes Brother   . High Cholesterol Brother   . Hypertension Brother   . High Cholesterol Brother   . Hypertension Sister   . Diabetes type II Sister   . High Cholesterol Sister   . Alcohol abuse Brother   . Reye's syndrome Daughter   . Hepatitis Brother   . Stroke    . Cancer Maternal Grandmother 26  . Colon cancer Neg Hx   . Esophageal cancer Neg Hx   . Stomach cancer Neg Hx   . Rectal cancer Neg Hx    Family Status  Relation Status  . Father Deceased at age 44  . Mother Deceased at age 49  . Brother Alive  . Brother Alive  . Sister Alive  . Brother Alive  . Daughter Deceased at age 22  . Daughter Alive  . Daughter Alive  . Brother   .    Marland Kitchen Maternal Grandmother   . Neg Hx     Social History   Social History  . Marital status: Married    Spouse name: N/A  . Number of children: N/A  . Years of education: N/A   Occupational History  . Not on file.   Social History Main Topics  . Smoking status: Former Smoker    Years: 15.00    Quit date: 05/09/1986  . Smokeless tobacco: Never Used  . Alcohol use No     Comment: yearly  . Drug use: No  . Sexual activity: Yes   Other Topics Concern  . Not on file   Social History Narrative  . No narrative on file    Allergies  Allergen Reactions  . Penicillins     Rash   . Ivp Dye [Iodinated Diagnostic Agents]     Hives   . Percocet [Oxycodone-Acetaminophen]     Itching        Medication List       Accurate as of 03/18/16 12:32 PM. Always use your most recent med list.          clindamycin 300 MG capsule Commonly known as:  CLEOCIN Take 2 capsules (600 mg total)  by mouth once. Prior to dental work   fenofibrate 145 MG tablet Commonly known as:  TRICOR TAKE ONE TABLET BY MOUTH ONCE DAILY   glipiZIDE 5 MG tablet Commonly known as:  GLUCOTROL Take 1 tablet (5 mg total) by mouth 2 (two) times daily before a meal.   losartan-hydrochlorothiazide 100-12.5 MG tablet Commonly known as:  HYZAAR TAKE ONE TABLET BY MOUTH ONCE DAILY   omeprazole 20 MG capsule Commonly known as:  PRILOSEC TAKE ONE CAPSULE BY MOUTH ONCE DAILY   PARoxetine 30 MG tablet Commonly known as:  PAXIL TAKE ONE TABLET BY MOUTH ONCE DAILY        Review of Systems:  Review of Systems  Cardiovascular: Positive for palpitations.  All other systems reviewed and are negative.   Physical Exam: Vitals:   03/18/16 0953  BP: 138/80  Pulse: 76  Temp: 98.3 F (36.8 C)  TempSrc: Oral  SpO2: 98%  Weight: 221 lb (100.2 kg)  Height: 5\' 4"  (1.626 m)   Body mass index is 37.93 kg/m. Physical Exam  Constitutional: She is oriented to person, place, and time. She appears well-developed and well-nourished. No distress.  HENT:  Head: Normocephalic and atraumatic.  Right Ear: Hearing, tympanic membrane, external ear and ear canal normal.  Left Ear: Hearing, tympanic membrane, external ear and ear canal normal.  Mouth/Throat: Uvula is midline, oropharynx is clear and moist and mucous membranes are normal. She does not have dentures.  Eyes: Conjunctivae, EOM and lids are normal. Pupils are equal, round, and reactive to light. No scleral icterus.  Neck: Trachea normal and normal range of motion. Neck supple. Carotid bruit is not present. No thyroid mass and no thyromegaly present.  Cardiovascular: Normal rate, regular rhythm, normal heart sounds and intact distal pulses.  Exam  reveals no gallop and no friction rub.   No murmur heard. No carotid bruit b/l. No LE edema b/l. No calf TTP.   Pulmonary/Chest: Effort normal and breath sounds normal. She has no wheezes. She has no rhonchi. She has no rales. Right breast exhibits no inverted nipple, no mass, no nipple discharge, no skin change and no tenderness. Left breast exhibits skin change (at lumpectomy site). Left breast exhibits no inverted nipple, no mass, no nipple discharge and no tenderness. Breasts are symmetrical.  Abdominal: Soft. Normal appearance, normal aorta and bowel sounds are normal. She exhibits no pulsatile midline mass and no mass. There is no hepatosplenomegaly. There is no tenderness. There is no rigidity, no rebound and no guarding. No hernia.  Musculoskeletal: Normal range of motion.  Lymphadenopathy:       Head (right side): No posterior auricular adenopathy present.       Head (left side): No posterior auricular adenopathy present.    She has no cervical adenopathy.       Right: No supraclavicular adenopathy present.       Left: No supraclavicular adenopathy present.  Neurological: She is alert and oriented to person, place, and time. She has normal strength and normal reflexes. No cranial nerve deficit. Gait normal.  Skin: Skin is warm, dry and intact. No rash noted. Nails show no clubbing.  Psychiatric: She has a normal mood and affect. Her speech is normal and behavior is normal. Thought content normal. Cognition and memory are normal.   Diabetic Foot Exam - Simple   Simple Foot Form Diabetic Foot exam was performed with the following findings:  Yes 03/18/2016 10:57 AM  Visual Inspection No deformities, no ulcerations, no other skin  breakdown bilaterally:  Yes Sensation Testing Intact to touch and monofilament testing bilaterally:  Yes Pulse Check Posterior Tibialis and Dorsalis pulse intact bilaterally:  Yes Comments      Labs reviewed:  Basic Metabolic Panel:  Recent Labs   09/02/15 0846 12/09/15 0843 03/14/16 0948  NA 140 138 138  K 4.7 4.8 4.3  CL 101 103 102  CO2 22 25 24   GLUCOSE 119* 124* 130*  BUN 19 23 20   CREATININE 1.24* 1.33* 1.09*  CALCIUM 10.5* 10.4 10.1  TSH  --   --  0.73   Liver Function Tests:  Recent Labs  09/02/15 0846 12/09/15 0843 03/14/16 0948  AST 19  --  20  ALT 16 16 17   ALKPHOS 45  --  45  BILITOT 0.2  --  0.5  PROT 7.2  --  7.0  ALBUMIN 4.3  --  4.3   No results for input(s): LIPASE, AMYLASE in the last 8760 hours. No results for input(s): AMMONIA in the last 8760 hours. CBC:  Recent Labs  03/14/16 0948  WBC 4.8  NEUTROABS 2,208  HGB 13.1  HCT 40.0  MCV 87.7  PLT 299   Lipid Panel:  Recent Labs  09/02/15 0846 12/09/15 0843 03/14/16 0948  CHOL 177 168 193  HDL 50 51 51  LDLCALC 97 92 110*  TRIG 152* 126 162*  CHOLHDL 3.5 3.3 3.8   Lab Results  Component Value Date   HGBA1C 6.1 (H) 03/14/2016    Procedures: No results found.  ECG OBTAINED AND REVIEWED BY MYSELF: NSR @ 60 bpm, nml axis, LAE, low voltage. No acute ischemic changes. No change since 03/2014.   Assessment/Plan   ICD-9-CM ICD-10-CM   1. Well adult exam V70.0 Z00.00   2. Class 2 obesity due to excess calories with serious comorbidity and body mass index (BMI) of 37.0 to 37.9 in adult 278.00 E66.09    V85.37 Z68.37   3. Controlled type 2 diabetes mellitus with chronic kidney disease, without long-term current use of insulin, unspecified CKD stage (HCC) 250.40 E11.22    585.9    4. Essential hypertension, benign 401.1 I10   5. Hyperlipidemia with target LDL less than 100 272.4 E78.5   6. Depression, unspecified depression type 311 F32.9   7. Gastroesophageal reflux disease, esophagitis presence not specified 530.81 K21.9   8. Other seasonal allergic rhinitis 477.8 J30.2   9. Radial scar of breast 611.89 N64.89    s/p left breast lumpectomy     Pt is UTD on health maintenance. Vaccinations are UTD. Pt maintains a healthy  lifestyle. Encouraged pt to exercise 30-45 minutes 4-5 times per week. Eat a well balanced diet. Avoid smoking. Limit alcohol intake. Wear seatbelt when riding in the car. Wear sun block (SPF >50) when spending extended times outside.  She declines further w/u of palpitations at this time. May need 2 D echo +/- lexiscan stress test in future  Continue current medications as ordered  Check blood sugar daily  Follow up in 4 mos for routine visit. Fasting labs prior to visit  [place] S. Perlie Gold  [facility] and [facility] [address] [place], [place] [address] [phone] Cell (Monday-Friday 8 AM - 5 PM) [phone] After 5 PM and follow prompts

## 2016-03-18 NOTE — Patient Instructions (Signed)
[name] , Thank you for taking time to come for your Medicare Wellness Visit. I appreciate your ongoing commitment to your health goals. Please review the following plan we discussed and let me know if I can assist you in the future.   These are the goals we discussed: Goals    None      This is a list of the screening recommended for you and due dates:  Health Maintenance  Topic Date Due  . Eye exam for diabetics  06/23/2016  . Mammogram  07/15/2016  . Hemoglobin A1C  09/11/2016  . Complete foot exam   12/10/2016  . Pneumonia vaccines (2 of 2 - PPSV23) 04/09/2018  . Tetanus Vaccine  08/18/2019  . Colon Cancer Screening  04/14/2020  . Flu Shot  Completed  . DEXA scan (bone density measurement)  Completed  . Shingles Vaccine  Completed  .  Hepatitis C: One time screening is recommended by Center for Disease Control  (CDC) for  adults born from [birth year] through 1967.   Completed  Preventive Care for Adults  A healthy lifestyle and preventive care can promote health and wellness. Preventive health guidelines for adults include the following key practices.  . A routine yearly physical is a good way to check with your health care provider about your health and preventive screening. It is a chance to share any concerns and updates on your health and to receive a thorough exam.  . Visit your dentist for a routine exam and preventive care every 6 months. Brush your teeth twice a day and floss once a day. Good oral hygiene prevents tooth decay and gum disease.  . The frequency of eye exams is based on your age, health, family medical history, use  of contact lenses, and other factors. Follow your health care provider's ecommendations for frequency of eye exams.  . Eat a healthy diet. Foods like vegetables, fruits, whole grains, low-fat dairy products, and lean protein foods contain the nutrients you need without too many calories. Decrease your intake of foods high in solid fats, added  sugars, and salt. Eat the right amount of calories for you. Get information about a proper diet from your health care provider, if necessary.  . Regular physical exercise is one of the most important things you can do for your health. Most adults should get at least 150 minutes of moderate-intensity exercise (any activity that increases your heart rate and causes you to sweat) each week. In addition, most adults need muscle-strengthening exercises on 2 or more days a week.  Silver Sneakers may be a benefit available to you. To determine eligibility, you may visit the website: www.silversneakers.com or contact program at 620-672-5392 Mon-Fri between 8AM-8PM.   . Maintain a healthy weight. The body mass index (BMI) is a screening tool to identify possible weight problems. It provides an estimate of body fat based on height and weight. Your health care provider can find your BMI and can help you achieve or maintain a healthy weight.   For adults 20 years and older: ? A BMI below 18.5 is considered underweight. ? A BMI of 18.5 to 24.9 is normal. ? A BMI of 25 to 29.9 is considered overweight. ? A BMI of 30 and above is considered obese.   . Maintain normal blood lipids and cholesterol levels by exercising and minimizing your intake of saturated fat. Eat a balanced diet with plenty of fruit and vegetables. Blood tests for lipids and cholesterol should begin at  age 47 and be repeated every 5 years. If your lipid or cholesterol levels are high, you are over 50, or you are at high risk for heart disease, you may need your cholesterol levels checked more frequently. Ongoing high lipid and cholesterol levels should be treated with medicines if diet and exercise are not working.  . If you smoke, find out from your health care provider how to quit. If you do not use tobacco, please do not start.  . If you choose to drink alcohol, please do not consume more than 2 drinks per day. One drink is considered to  be 12 ounces (355 mL) of beer, 5 ounces (148 mL) of wine, or 1.5 ounces (44 mL) of liquor.  . If you are 22-71 years old, ask your health care provider if you should take aspirin to prevent strokes.  . Use sunscreen. Apply sunscreen liberally and repeatedly throughout the day. You should seek shade when your shadow is shorter than you. Protect yourself by wearing long sleeves, pants, a wide-brimmed hat, and sunglasses year round, whenever you are outdoors.  . Once a month, do a whole body skin exam, using a mirror to look at the skin on your back. Tell your health care provider of new moles, moles that have irregular borders, moles that are larger than a pencil eraser, or moles that have changed in shape or color.

## 2016-03-18 NOTE — Progress Notes (Signed)
Subjective:   Jill Garza is a 65 y.o. female who presents for Medicare Annual (Subsequent) preventive examination.  Review of Systems:  Cardiac Risk Factors include: advanced age (>52men, >69 women);diabetes mellitus;obesity (BMI >30kg/m2);sedentary lifestyle;hypertension     Objective:     Vitals: BP 138/80 (BP Location: Left Arm, Patient Position: Sitting, Cuff Size: Large)   Pulse 76   Temp 98.3 F (36.8 C) (Oral)   Ht 5\' 4"  (1.626 m)   Wt 221 lb (100.2 kg)   SpO2 98%   BMI 37.93 kg/m   Body mass index is 37.93 kg/m.   Tobacco History  Smoking Status  . Former Smoker  . Years: 15.00  . Quit date: 05/09/1986  Smokeless Tobacco  . Never Used     Counseling given: No   Past Medical History:  Diagnosis Date  . Arthritis   . Cancer ([place])    melanoma- right leg  . Depression   . GERD (gastroesophageal reflux disease)   . History of hiatal hernia   . Hypercholesterolemia   . Hypertension   . Sweat, sweating, excessive   . Type 2 diabetes mellitus ([place])    Past Surgical History:  Procedure Laterality Date  . ABDOMINAL HYSTERECTOMY  1979  . APPENDECTOMY  1964  . BREAST LUMPECTOMY WITH RADIOACTIVE SEED LOCALIZATION Left 12/31/2014   Procedure: LEFT BREAST LUMPECTOMY WITH RADIOACTIVE SEED LOCALIZATION;  Surgeon: Donnie Mesa, MD;  Location: [facility];  Service: General;  Laterality: Left;  . CARPAL TUNNEL RELEASE Right 1977  . CARPAL TUNNEL RELEASE Left 2007  . CATARACT EXTRACTION Left 04/13/2014   Dr. Herbert Deaner  . CATARACT EXTRACTION Right 06/10/2014   Dr. Herbert Deaner  . COLONOSCOPY  2009  . JOINT REPLACEMENT    . MELANOMA EXCISION    . TONSILLECTOMY  1962  . TOTAL KNEE ARTHROPLASTY Left 2012  . TOTAL KNEE ARTHROPLASTY Right 2008   Family History  Problem Relation Age of Onset  . Heart attack Father   . Pulmonary embolism Mother     Multiple, B/L   . Hypertension Brother   . Heart disease Brother   . Diabetes Brother   . High Cholesterol  Brother   . Hypertension Brother   . High Cholesterol Brother   . Hypertension Sister   . Diabetes type II Sister   . High Cholesterol Sister   . Alcohol abuse Brother   . Reye's syndrome Daughter   . Hepatitis Brother   . Stroke    . Cancer Maternal Grandmother 70  . Colon cancer Neg Hx   . Esophageal cancer Neg Hx   . Stomach cancer Neg Hx   . Rectal cancer Neg Hx    History  Sexual Activity  . Sexual activity: Yes    Outpatient Encounter Prescriptions as of 03/18/2016  Medication Sig  . clindamycin (CLEOCIN) 300 MG capsule Take 2 capsules (600 mg total) by mouth once. Prior to dental work  . fenofibrate (TRICOR) 145 MG tablet TAKE ONE TABLET BY MOUTH ONCE DAILY  . glipiZIDE (GLUCOTROL) 5 MG tablet Take 1 tablet (5 mg total) by mouth 2 (two) times daily before a meal.  . losartan-hydrochlorothiazide (HYZAAR) 100-12.5 MG tablet TAKE ONE TABLET BY MOUTH ONCE DAILY  . omeprazole (PRILOSEC) 20 MG capsule TAKE ONE CAPSULE BY MOUTH ONCE DAILY  . PARoxetine (PAXIL) 30 MG tablet TAKE ONE TABLET BY MOUTH ONCE DAILY   No facility-administered encounter medications on file as of 03/18/2016.     Activities of  Daily Living In your present state of health, do you have any difficulty performing the following activities: 03/18/2016  Hearing? Y  Vision? N  Difficulty concentrating or making decisions? N  Walking or climbing stairs? N  Dressing or bathing? N  Doing errands, shopping? N  Preparing Food and eating ? N  Using the Toilet? N  In the past six months, have you accidently leaked urine? N  Do you have problems with loss of bowel control? N  Managing your Medications? N  Managing your Finances? N  Housekeeping or managing your Housekeeping? N  Some recent data might be hidden    Patient Care Team: Lauree Chandler, NP as PCP - General (Nurse Practitioner)    Assessment:    Exercise Activities and Dietary recommendations Current Exercise Habits: The patient does not  participate in regular exercise at present, Exercise limited by: None identified  Goals    . Weight (lb) < 200 lb (90.7 kg)          Starting 03/18/16, I will attempt to get under 200 lbs.       Fall Risk Fall Risk  03/18/2016 12/11/2015 09/04/2015 06/04/2015 01/29/2015  Falls in the past year? No No No No No   Depression Screen PHQ 2/9 Scores 03/18/2016 06/04/2015 12/17/2013  PHQ - 2 Score 0 0 0     Cognitive Function MMSE - Mini Mental State Exam 03/18/2016 01/29/2015  Orientation to time 5 5  Orientation to Place 5 5  Registration 3 3  Attention/ Calculation 5 5  Recall 3 1  Language- name 2 objects 2 2  Language- repeat 1 1  Language- follow 3 step command 3 3  Language- read & follow direction 1 1  Write a sentence 1 1  Copy design 1 1  Total score 30 28        Immunization History  Administered Date(s) Administered  . Influenza Whole 01/26/2013  . Influenza-Unspecified 01/28/2014, 02/18/2016  . Pneumococcal Conjugate-13 12/17/2013  . Pneumococcal Polysaccharide-23 04/09/2013  . Tdap 08/17/2009  . Zoster 01/09/2011   Screening Tests Health Maintenance  Topic Date Due  . OPHTHALMOLOGY EXAM  06/23/2016  . MAMMOGRAM  07/15/2016  . HEMOGLOBIN A1C  09/11/2016  . FOOT EXAM  12/10/2016  . PNA vac Low Risk Adult (2 of 2 - PPSV23) 04/09/2018  . TETANUS/TDAP  08/18/2019  . COLONOSCOPY  04/14/2020  . INFLUENZA VACCINE  Completed  . DEXA SCAN  Completed  . ZOSTAVAX  Completed  . Hepatitis C Screening  Completed      Plan:    I have personally reviewed and addressed the Medicare Annual Wellness questionnaire and have noted the following in the patient's chart:  A. Medical and social history B. Use of alcohol, tobacco or illicit drugs  C. Current medications and supplements D. Functional ability and status E.  Nutritional status F.  Physical activity G. Advance directives H. List of other physicians I.  Hospitalizations, surgeries, and ER visits in previous 12  months J.  [place] to include hearing, vision, cognitive, depression L. Referrals and appointments - none  In addition, I have reviewed and discussed with patient certain preventive protocols, quality metrics, and best practice recommendations. A written personalized care plan for preventive services as well as general preventive health recommendations were provided to patient.  See attached scanned questionnaire for additional information.   Signed,   Allyn Kenner, LPN Health Advisor   I have reviewed the health advisor's note and was  available for consultation. I agree with documentation and plan.   Kimmora Risenhoover S. Perlie Gold  [facility] [address] [place], [place] [address] [phone] Cell (Monday-Friday 8 AM - 5 PM) [phone] After 5 PM and follow prompts

## 2016-03-18 NOTE — Patient Instructions (Signed)
Encouraged her to exercise 30-45 minutes 4-5 times per week. Eat a well balanced diet. Avoid smoking. Limit alcohol intake. Wear seatbelt when riding in the car. Wear sun block (SPF >50) when spending extended times outside.  Continue current medications as ordered  Check blood sugar daily  Follow up in 4 mos for routine visit. Fasting labs prior to visit

## 2016-03-23 ENCOUNTER — Telehealth: Payer: Self-pay | Admitting: *Deleted

## 2016-03-23 NOTE — Telephone Encounter (Signed)
[place] for acyclovir 800mg  #35 take 1 tab po five times daily x 7 days. No RF; wash hands frequently and avoid very young children and pregnant persons.

## 2016-03-23 NOTE — Telephone Encounter (Signed)
Patient called and stated that she was just in to see you, now she is having pain from her spine to under her breast with rash. Patient stated that she has the Shingles and wants to know if you will call her in some Axyclovir. She has had the Shingles Vaccine. Please Advise.

## 2016-03-24 MED ORDER — ACYCLOVIR 800 MG PO TABS: ORAL_TABLET | ORAL | 0 refills | Status: DC

## 2016-03-24 NOTE — Telephone Encounter (Signed)
Patient notified and agreed. Rx faxed to pharmacy.  

## 2016-04-13 ENCOUNTER — Other Ambulatory Visit: Payer: Self-pay | Admitting: Internal Medicine

## 2016-04-13 ENCOUNTER — Encounter: Payer: Self-pay | Admitting: Internal Medicine

## 2016-04-15 ENCOUNTER — Other Ambulatory Visit: Payer: Self-pay | Admitting: Internal Medicine

## 2016-04-21 ENCOUNTER — Encounter: Payer: Self-pay | Admitting: Internal Medicine

## 2016-04-22 ENCOUNTER — Other Ambulatory Visit: Payer: Self-pay

## 2016-04-22 DIAGNOSIS — Z1231 Encounter for screening mammogram for malignant neoplasm of breast: Secondary | ICD-10-CM

## 2016-05-12 ENCOUNTER — Other Ambulatory Visit: Payer: Self-pay

## 2016-05-12 MED ORDER — FENOFIBRATE 145 MG PO TABS: 145.0000 mg | ORAL_TABLET | Freq: Every day | ORAL | 1 refills | Status: DC

## 2016-05-30 ENCOUNTER — Ambulatory Visit: Payer: Medicare Other

## 2016-05-31 ENCOUNTER — Ambulatory Visit
Admission: RE | Admit: 2016-05-31 | Discharge: 2016-05-31 | Disposition: A | Discharge status: Home / Self Care | Payer: Medicare Other | Source: Ambulatory Visit | Attending: Nurse Practitioner | Admitting: Nurse Practitioner

## 2016-05-31 DIAGNOSIS — Z1231 Encounter for screening mammogram for malignant neoplasm of breast: Secondary | ICD-10-CM

## 2016-06-07 ENCOUNTER — Encounter: Payer: Self-pay | Admitting: Nurse Practitioner

## 2016-07-15 ENCOUNTER — Other Ambulatory Visit: Payer: Self-pay | Admitting: Internal Medicine

## 2016-07-19 ENCOUNTER — Encounter: Payer: Self-pay | Admitting: Internal Medicine

## 2016-07-19 ENCOUNTER — Other Ambulatory Visit: Payer: Self-pay

## 2016-07-19 MED ORDER — OMEPRAZOLE 20 MG PO CPDR: 20.0000 mg | DELAYED_RELEASE_CAPSULE | Freq: Every day | ORAL | 1 refills | Status: DC

## 2016-07-20 ENCOUNTER — Other Ambulatory Visit: Payer: Medicare Other

## 2016-07-22 ENCOUNTER — Ambulatory Visit: Payer: Medicare Other | Admitting: Internal Medicine

## 2016-07-27 LAB — HM DIABETES EYE EXAM

## 2016-08-18 ENCOUNTER — Other Ambulatory Visit: Payer: Self-pay | Admitting: Internal Medicine

## 2016-08-22 DIAGNOSIS — H26492 Other secondary cataract, left eye: Secondary | ICD-10-CM | POA: Diagnosis not present

## 2016-08-22 DIAGNOSIS — H43811 Vitreous degeneration, right eye: Secondary | ICD-10-CM | POA: Diagnosis not present

## 2016-08-22 DIAGNOSIS — H26491 Other secondary cataract, right eye: Secondary | ICD-10-CM | POA: Diagnosis not present

## 2016-08-29 DIAGNOSIS — H26492 Other secondary cataract, left eye: Secondary | ICD-10-CM | POA: Diagnosis not present

## 2016-09-01 ENCOUNTER — Telehealth: Payer: Self-pay

## 2016-09-01 DIAGNOSIS — I10 Essential (primary) hypertension: Secondary | ICD-10-CM

## 2016-09-01 DIAGNOSIS — Z79899 Other long term (current) drug therapy: Secondary | ICD-10-CM

## 2016-09-01 DIAGNOSIS — E1122 Type 2 diabetes mellitus with diabetic chronic kidney disease: Secondary | ICD-10-CM

## 2016-09-01 DIAGNOSIS — E785 Hyperlipidemia, unspecified: Secondary | ICD-10-CM

## 2016-09-01 NOTE — Telephone Encounter (Signed)
Patient needs labs ordered for upcoming lab appointment. According to last OV note, patient is to get fasting labs.   Which labs should be ordered and with what dx codes.   Please advise.

## 2016-09-01 NOTE — Telephone Encounter (Signed)
done

## 2016-09-20 ENCOUNTER — Other Ambulatory Visit: Payer: Medicare Other

## 2016-09-23 ENCOUNTER — Ambulatory Visit: Payer: Medicare Other | Admitting: Internal Medicine

## 2016-10-14 ENCOUNTER — Other Ambulatory Visit: Payer: Self-pay | Admitting: Internal Medicine

## 2016-10-19 ENCOUNTER — Other Ambulatory Visit: Payer: Medicare Other

## 2016-10-19 DIAGNOSIS — E1122 Type 2 diabetes mellitus with diabetic chronic kidney disease: Secondary | ICD-10-CM | POA: Diagnosis not present

## 2016-10-19 DIAGNOSIS — Z79899 Other long term (current) drug therapy: Secondary | ICD-10-CM | POA: Diagnosis not present

## 2016-10-19 DIAGNOSIS — E785 Hyperlipidemia, unspecified: Secondary | ICD-10-CM | POA: Diagnosis not present

## 2016-10-19 DIAGNOSIS — I10 Essential (primary) hypertension: Secondary | ICD-10-CM

## 2016-10-19 LAB — COMPLETE METABOLIC PANEL WITH GFR
ALBUMIN: 4.3 g/dL (ref 3.6–5.1)
ALT: 18 U/L (ref 6–29)
AST: 19 U/L (ref 10–35)
Alkaline Phosphatase: 44 U/L (ref 33–130)
BUN: 22 mg/dL (ref 7–25)
CALCIUM: 10.2 mg/dL (ref 8.6–10.4)
CHLORIDE: 103 mmol/L (ref 98–110)
CO2: 27 mmol/L (ref 20–31)
Creat: 1.34 mg/dL — ABNORMAL HIGH (ref 0.50–0.99)
GFR, EST AFRICAN AMERICAN: 47 mL/min — AB (ref >=60)
GFR, EST NON AFRICAN AMERICAN: 41 mL/min — AB (ref >=60)
Glucose, Bld: 118 mg/dL — ABNORMAL HIGH (ref 65–99)
POTASSIUM: 4.6 mmol/L (ref 3.5–5.3)
Sodium: 139 mmol/L (ref 135–146)
Total Bilirubin: 0.5 mg/dL (ref 0.2–1.2)
Total Protein: 7.1 g/dL (ref 6.1–8.1)

## 2016-10-19 LAB — LIPID PANEL
CHOLESTEROL: 162 mg/dL (ref <200)
HDL: 45 mg/dL — AB (ref >50)
LDL Cholesterol: 84 mg/dL (ref <100)
TRIGLYCERIDES: 164 mg/dL — AB (ref <150)
Total CHOL/HDL Ratio: 3.6 Ratio (ref <5.0)
VLDL: 33 mg/dL — ABNORMAL HIGH (ref <30)

## 2016-10-20 LAB — HEMOGLOBIN A1C
HEMOGLOBIN A1C: 6.3 % — AB (ref <5.7)
MEAN PLASMA GLUCOSE: 134 mg/dL

## 2016-10-21 ENCOUNTER — Ambulatory Visit (INDEPENDENT_AMBULATORY_CARE_PROVIDER_SITE_OTHER): Payer: Medicare Other | Admitting: Internal Medicine

## 2016-10-21 ENCOUNTER — Encounter: Payer: Self-pay | Admitting: Internal Medicine

## 2016-10-21 VITALS — BP 130/84 | HR 75 | Temp 98.4°F | Ht 64.0 in | Wt 222.0 lb

## 2016-10-21 DIAGNOSIS — Z6837 Body mass index (BMI) 37.0-37.9, adult: Secondary | ICD-10-CM | POA: Diagnosis not present

## 2016-10-21 DIAGNOSIS — IMO0001 Reserved for inherently not codable concepts without codable children: Secondary | ICD-10-CM

## 2016-10-21 DIAGNOSIS — E6609 Other obesity due to excess calories: Secondary | ICD-10-CM | POA: Diagnosis not present

## 2016-10-21 DIAGNOSIS — E1122 Type 2 diabetes mellitus with diabetic chronic kidney disease: Secondary | ICD-10-CM | POA: Diagnosis not present

## 2016-10-21 DIAGNOSIS — E785 Hyperlipidemia, unspecified: Secondary | ICD-10-CM | POA: Diagnosis not present

## 2016-10-21 DIAGNOSIS — F32A Depression, unspecified: Secondary | ICD-10-CM

## 2016-10-21 DIAGNOSIS — F329 Major depressive disorder, single episode, unspecified: Secondary | ICD-10-CM

## 2016-10-21 DIAGNOSIS — I10 Essential (primary) hypertension: Secondary | ICD-10-CM | POA: Diagnosis not present

## 2016-10-21 DIAGNOSIS — Z79899 Other long term (current) drug therapy: Secondary | ICD-10-CM

## 2016-10-21 MED ORDER — CLINDAMYCIN HCL 300 MG PO CAPS: ORAL_CAPSULE | ORAL | 0 refills | Status: DC

## 2016-10-21 NOTE — Patient Instructions (Signed)
Increase exercise as tolerated  Continue diet plan  Continue current medications as ordered  Follow up in 6 mos for AWV/EV. Fating labs prior to appt

## 2016-10-21 NOTE — Progress Notes (Signed)
Patient ID: Jill Garza, female   DOB: 04-09-1951, 66 y.o.   MRN: [id number]    Location:  [facility] Place of Service: OFFICE  Chief Complaint  Patient presents with  . Medical Management of Chronic Issues    4 month medication management     HPI:  66 yo female seen today for f/u. She is scheduled for dental work next month and needs rx for Clindamycin.  DM - BS 130s. Rare low BS reaction. She takes glipizide. metformin stopped due to worsening renal fxn. A1c 6.3%. Cr 1.34; urine microalbumin/Cr 3.0  Hyperlipidemia - stable on fenofibrate. LDL 84; TG 164; HDL 45  Depression - mood stable on paxil  GERD - stable on omeprazole  HTN - BP stable on losartan hct  Wheezing/cough - uses HFA prn. No formal PFTs. Past hx tobacco abuse  CKD - stage 3. Stable. Cr 1.34 (range 1.01 - 1.34)   Past Medical History:  Diagnosis Date  . Arthritis   . Cancer ([facility])    melanoma- right leg  . Depression   . GERD (gastroesophageal reflux disease)   . History of hiatal hernia   . Hypercholesterolemia   . Hypertension   . Sweat, sweating, excessive   . Type 2 diabetes mellitus ([facility])     Past Surgical History:  Procedure Laterality Date  . ABDOMINAL HYSTERECTOMY  1979  . APPENDECTOMY  1964  . BREAST LUMPECTOMY WITH RADIOACTIVE SEED LOCALIZATION Left 12/31/2014   Procedure: LEFT BREAST LUMPECTOMY WITH RADIOACTIVE SEED LOCALIZATION;  Surgeon: Manus Rudd, MD;  Location: [facility];  Service: General;  Laterality: Left;  . CARPAL TUNNEL RELEASE Right 1977  . CARPAL TUNNEL RELEASE Left 2007  . CATARACT EXTRACTION Left 04/13/2014   Dr. Elmer Picker  . CATARACT EXTRACTION Right 06/10/2014   Dr. Elmer Picker  . COLONOSCOPY  2009  . JOINT REPLACEMENT    . MELANOMA EXCISION    . TONSILLECTOMY  1962  . TOTAL KNEE ARTHROPLASTY Left 2012  . TOTAL KNEE ARTHROPLASTY Right 2008    Patient Care Team: Sharon Seller, NP as PCP - General (Nurse Practitioner)  Social History   Social  History  . Marital status: Married    Spouse name: N/A  . Number of children: N/A  . Years of education: N/A   Occupational History  . Not on file.   Social History Main Topics  . Smoking status: Former Smoker    Years: 15.00    Quit date: 05/09/1986  . Smokeless tobacco: Never Used  . Alcohol use No     Comment: yearly  . Drug use: No  . Sexual activity: Yes   Other Topics Concern  . Not on file   Social History Narrative  . No narrative on file     reports that she quit smoking about 30 years ago. She quit after 15.00 years of use. She has never used smokeless tobacco. She reports that she does not drink alcohol or use drugs.  Family History  Problem Relation Age of Onset  . Heart attack Father   . Pulmonary embolism Mother        Multiple, B/L   . Hypertension Brother   . Heart disease Brother   . Diabetes Brother   . High Cholesterol Brother   . Hypertension Brother   . High Cholesterol Brother   . Hypertension Sister   . Diabetes type II Sister   . High Cholesterol Sister   . Alcohol abuse Brother   .  Reye's syndrome Daughter   . Hepatitis Brother   . Stroke Unknown   . Cancer Maternal Grandmother 81  . Colon cancer Neg Hx   . Esophageal cancer Neg Hx   . Stomach cancer Neg Hx   . Rectal cancer Neg Hx    Family Status  Relation Status  . Father Deceased at age 66  . Mother Deceased at age 15  . Brother Alive  . Brother Alive  . Sister Alive  . Brother Alive  . Daughter Deceased at age 1  . Daughter Alive  . Daughter Alive  . Brother (Not Specified)  . Unknown (Not Specified)  . MGM (Not Specified)  . Neg Hx (Not Specified)     Allergies  Allergen Reactions  . Penicillins     Rash   . Ivp Dye [Iodinated Diagnostic Agents]     Hives   . Percocet [Oxycodone-Acetaminophen]     Itching     Medications: Patient's Medications  New Prescriptions   No medications on file  Previous Medications   FENOFIBRATE (TRICOR) 145 MG TABLET    Take 1  tablet (145 mg total) by mouth daily.   GLIPIZIDE (GLUCOTROL) 5 MG TABLET    Take 1 tablet (5 mg total) by mouth 2 (two) times daily before a meal.   LOSARTAN-HYDROCHLOROTHIAZIDE (HYZAAR) 100-12.5 MG TABLET    TAKE 1 TABLET BY MOUTH ONCE DAILY   OMEPRAZOLE (PRILOSEC) 20 MG CAPSULE    Take 1 capsule (20 mg total) by mouth daily.   PAROXETINE (PAXIL) 30 MG TABLET    TAKE 1 TABLET BY MOUTH ONCE DAILY  Modified Medications   Modified Medication Previous Medication   CLINDAMYCIN (CLEOCIN) 300 MG CAPSULE clindamycin (CLEOCIN) 300 MG capsule      Take 2 capsules prior to dental work    Take 2 capsules (600 mg total) by mouth once. Prior to dental work  Discontinued Medications   ACYCLOVIR (ZOVIRAX) 800 MG TABLET    Take one tablet by mouth five times daily for 7 days   GLYBURIDE (DIABETA) 5 MG TABLET    TAKE ONE TABLET BY MOUTH TWICE DAILY BEFORE MEAL(S)    Review of Systems  Vitals:   10/21/16 0921  BP: 130/84  Pulse: 75  Temp: 98.4 F (36.9 C)  TempSrc: Oral  SpO2: 95%  Weight: 222 lb (100.7 kg)  Height: '5\' 4"'$  (1.626 m)   Body mass index is 38.11 kg/m.  Physical Exam  Constitutional: She is oriented to person, place, and time. She appears well-developed and well-nourished.  HENT:  Mouth/Throat: Oropharynx is clear and moist. No oropharyngeal exudate.  TMs appear nml  Eyes: Pupils are equal, round, and reactive to light. No scleral icterus.  Neck: Neck supple. Carotid bruit is not present. No tracheal deviation present. No thyromegaly present.  Cardiovascular: Normal rate, regular rhythm, normal heart sounds and intact distal pulses.  Exam reveals no gallop and no friction rub.   No murmur heard. No LE edema b/l. no calf TTP.   Pulmonary/Chest: Effort normal and breath sounds normal. No stridor. No respiratory distress. She has no wheezes. She has no rales.  Abdominal: Soft. Bowel sounds are normal. She exhibits no distension and no mass. There is no hepatomegaly. There is no  tenderness. There is no rebound and no guarding.  Musculoskeletal: She exhibits edema.  Lymphadenopathy:    She has no cervical adenopathy.  Neurological: She is alert and oriented to person, place, and time.  Skin: Skin is warm  and dry. No rash noted.  Psychiatric: She has a normal mood and affect. Her behavior is normal. Judgment and thought content normal.   Diabetic Foot Exam - Simple   Simple Foot Form Diabetic Foot exam was performed with the following findings:  Yes 10/21/2016 10:02 AM  Visual Inspection No deformities, no ulcerations, no other skin breakdown bilaterally:  Yes Sensation Testing Intact to touch and monofilament testing bilaterally:  Yes Pulse Check Posterior Tibialis and Dorsalis pulse intact bilaterally:  Yes Comments      Labs reviewed: Lab on 10/19/2016  Component Date Value Ref Range Status  . Sodium 10/19/2016 139  135 - 146 mmol/L Final  . Potassium 10/19/2016 4.6  3.5 - 5.3 mmol/L Final  . Chloride 10/19/2016 103  98 - 110 mmol/L Final  . CO2 10/19/2016 27  20 - 31 mmol/L Final  . Glucose, Bld 10/19/2016 118* 65 - 99 mg/dL Final  . BUN 21/07/1279 22  7 - 25 mg/dL Final  . Creat 18/86/7737 1.34* 0.50 - 0.99 mg/dL Final   Comment:   For patients > or = 67 years of age: The upper reference limit for Creatinine is approximately 13% higher for people identified as African-American.     . Total Bilirubin 10/19/2016 0.5  0.2 - 1.2 mg/dL Final  . Alkaline Phosphatase 10/19/2016 44  33 - 130 U/L Final  . AST 10/19/2016 19  10 - 35 U/L Final  . ALT 10/19/2016 18  6 - 29 U/L Final  . Total Protein 10/19/2016 7.1  6.1 - 8.1 g/dL Final  . Albumin 36/68/1594 4.3  3.6 - 5.1 g/dL Final  . Calcium 70/76/1518 10.2  8.6 - 10.4 mg/dL Final  . GFR, Est African American 10/19/2016 47* >=60 mL/min Final  . GFR, Est Non African American 10/19/2016 41* >=60 mL/min Final  . Hgb A1c MFr Bld 10/19/2016 6.3* <5.7 % Final   Comment:   For someone without known  diabetes, a hemoglobin A1c value between 5.7% and 6.4% is consistent with prediabetes and should be confirmed with a follow-up test.   For someone with known diabetes, a value <7% indicates that their diabetes is well controlled. A1c targets should be individualized based on duration of diabetes, age, co-morbid conditions and other considerations.   This assay result is consistent with an increased risk of diabetes.   Currently, no consensus exists regarding use of hemoglobin A1c for diagnosis of diabetes in children.     . Mean Plasma Glucose 10/19/2016 134  mg/dL Final  . Cholesterol 34/37/3578 162  <200 mg/dL Final  . Triglycerides 10/19/2016 164* <150 mg/dL Final  . HDL 97/84/7841 45* >50 mg/dL Final  . Total CHOL/HDL Ratio 10/19/2016 3.6  <2.8 Ratio Final  . VLDL 10/19/2016 33* <30 mg/dL Final  . LDL Cholesterol 10/19/2016 84  <100 mg/dL Final  Abstract on 20/81/3887  Component Date Value Ref Range Status  . HM Diabetic Eye Exam 07/27/2016 No Retinopathy  No Retinopathy Final   Done By [facility]    No results found.   Assessment/Plan   ICD-10-CM   1. Controlled type 2 diabetes mellitus with chronic kidney disease, without long-term current use of insulin, unspecified CKD stage (HCC) E11.22 CMP with eGFR    Hemoglobin A1c    Urinalysis with Reflex Microscopic    Microalbumin/Creatinine Ratio, Urine  2. Essential hypertension, benign I10 CBC with Differential/Platelets    Urinalysis with Reflex Microscopic  3. Hyperlipidemia with target LDL less than 100 E78.5 TSH  Lipid Panel  4. Depression, unspecified depression type F32.9   5. Class 2 obesity due to excess calories with serious comorbidity and body mass index (BMI) of 37.0 to 37.9 in adult E66.09    Z68.37   6. High risk medication use Z79.899    Increase exercise as tolerated  Continue diet plan  Continue current medications as ordered  Follow up in 6 mos for AWV/EV. Fating labs prior to  appt    [place] S. Perlie Gold  [facility] [address] [place], [place] [address] [phone] Cell (Monday-Friday 8 AM - 5 PM) [phone] After 5 PM and follow prompts

## 2016-11-13 IMAGING — US US BREAST LTD UNI LEFT INC AXILLA
1 series · 1 of 1 positions shown · non-contrast
Comparison: Priors

CLINICAL DATA: Patient recalled from screening for left breast
architectural distortion.

EXAM:
DIGITAL DIAGNOSTIC LEFT MAMMOGRAM WITH 3D TOMOSYNTHESIS WITH CAD
ULTRASOUND LEFT BREAST

[Series 1: advbreast · 1 of 1 slices shown]
[im 1/1]
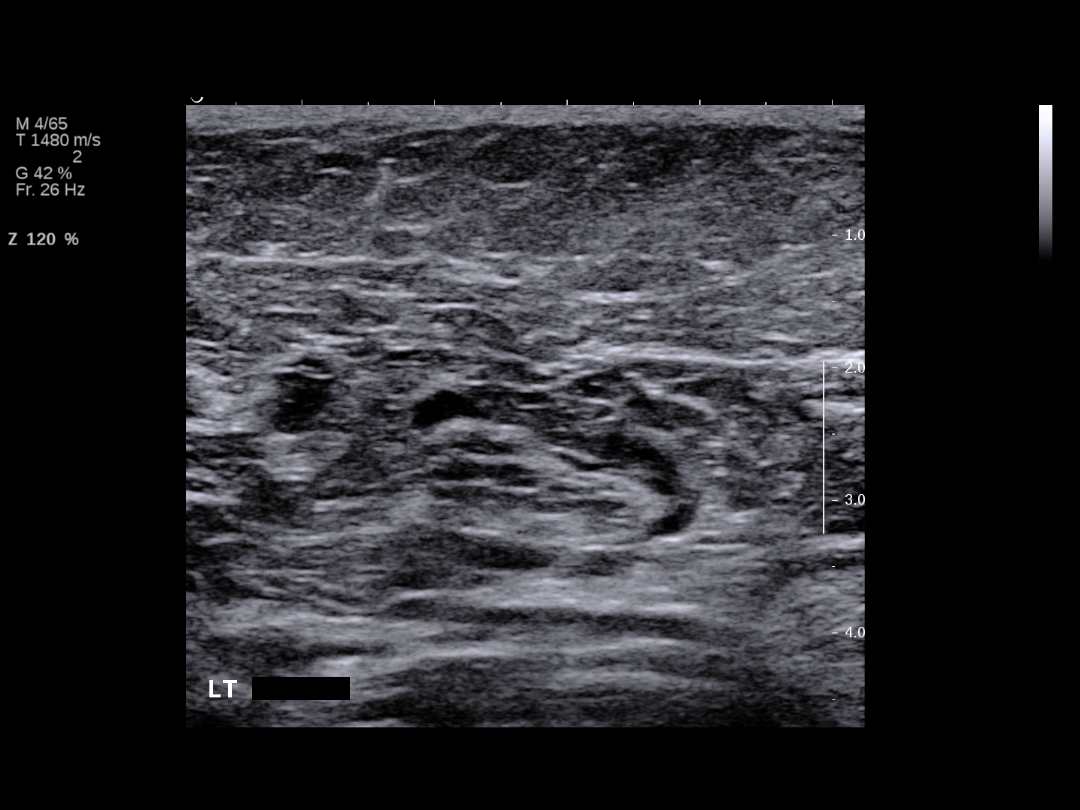

[1 of 1 positions shown; findings below may reference images not displayed]

ACR Breast Density Category b: There are scattered areas of
fibroglandular density.
FINDINGS: There is persistent distortion within the lower outer left breast
middle depth further evaluated with tomosynthesis images.

Mammographic images were processed with CAD.

On physical exam, I palpate no discrete mass within the lower outer
left breast.

Targeted ultrasound is performed, showing no definite suspicious
mass or areas of shadowing to correspond with the left breast
architectural distortion. No left axillary lymphadenopathy.
IMPRESSION: Persistent left breast architectural distortion without sonographic
correlate.

RECOMMENDATION:
Surgical excisional biopsy with tomosynthesis guided needle
localization.

I have discussed the findings and recommendations with the patient.
Results were also provided in writing at the conclusion of the
visit. If applicable, a reminder letter will be sent to the patient
regarding the next appointment.

BI-RADS CATEGORY  4: Suspicious.

## 2016-12-05 ENCOUNTER — Encounter: Payer: Self-pay | Admitting: Internal Medicine

## 2016-12-05 ENCOUNTER — Other Ambulatory Visit: Payer: Self-pay

## 2016-12-05 MED ORDER — ZOSTER VAC RECOMB ADJUVANTED 50 MCG/0.5ML IM SUSR: 0.5000 mL | Freq: Once | INTRAMUSCULAR | 1 refills | Status: AC

## 2016-12-05 NOTE — Telephone Encounter (Signed)
Patient requested that Shingrix vaccine prescription be sent to her pharmacy. Rx was sent electronically.

## 2016-12-30 ENCOUNTER — Other Ambulatory Visit: Payer: Self-pay | Admitting: Nurse Practitioner

## 2017-01-05 DIAGNOSIS — D1801 Hemangioma of skin and subcutaneous tissue: Secondary | ICD-10-CM | POA: Diagnosis not present

## 2017-01-05 DIAGNOSIS — Z85820 Personal history of malignant melanoma of skin: Secondary | ICD-10-CM | POA: Diagnosis not present

## 2017-01-05 DIAGNOSIS — L821 Other seborrheic keratosis: Secondary | ICD-10-CM | POA: Diagnosis not present

## 2017-01-05 DIAGNOSIS — L814 Other melanin hyperpigmentation: Secondary | ICD-10-CM | POA: Diagnosis not present

## 2017-01-05 DIAGNOSIS — D225 Melanocytic nevi of trunk: Secondary | ICD-10-CM | POA: Diagnosis not present

## 2017-01-30 ENCOUNTER — Other Ambulatory Visit: Payer: Self-pay | Admitting: Internal Medicine

## 2017-02-22 DIAGNOSIS — Z23 Encounter for immunization: Secondary | ICD-10-CM | POA: Diagnosis not present

## 2017-03-14 ENCOUNTER — Other Ambulatory Visit: Payer: Self-pay | Admitting: Internal Medicine

## 2017-03-14 ENCOUNTER — Other Ambulatory Visit: Payer: Self-pay

## 2017-03-14 NOTE — Telephone Encounter (Signed)
Patient called indicating that she requested a refill for glyburide and it was denied with the indication that medication is not on list, I reviewed chart and medication was removed at 10/21/16 office visit.   Patient states this was done in error for she is taking medication and was never instructed to stop. I was unable to locate documentation advising patient to stop this medication.  Please Advise, if ok to add back to medication list   Patient needs medication sent to [facility] for a 90 day supply

## 2017-03-14 NOTE — Telephone Encounter (Signed)
Ok to RF med 90 day supply

## 2017-03-15 ENCOUNTER — Encounter: Payer: Self-pay | Admitting: Internal Medicine

## 2017-03-15 DIAGNOSIS — E119 Type 2 diabetes mellitus without complications: Secondary | ICD-10-CM

## 2017-03-15 MED ORDER — GLIPIZIDE 5 MG PO TABS: 5.0000 mg | ORAL_TABLET | Freq: Two times a day (BID) | ORAL | 1 refills | Status: DC

## 2017-03-16 NOTE — Telephone Encounter (Signed)
See mychart message, patient was requesting glipizide and stated she never heard of glyburide   RX was sent in by Jannet Mantis on 03/16/17

## 2017-04-07 ENCOUNTER — Encounter: Payer: Self-pay | Admitting: Internal Medicine

## 2017-04-13 ENCOUNTER — Other Ambulatory Visit: Payer: Medicare Other

## 2017-04-13 ENCOUNTER — Other Ambulatory Visit: Payer: Self-pay

## 2017-04-13 ENCOUNTER — Ambulatory Visit (INDEPENDENT_AMBULATORY_CARE_PROVIDER_SITE_OTHER): Payer: Medicare Other

## 2017-04-13 VITALS — BP 110/64 | HR 91 | Temp 98.1°F | Ht 64.0 in | Wt 226.0 lb

## 2017-04-13 DIAGNOSIS — E1122 Type 2 diabetes mellitus with diabetic chronic kidney disease: Secondary | ICD-10-CM

## 2017-04-13 DIAGNOSIS — Z0000 Encounter for general adult medical examination without abnormal findings: Secondary | ICD-10-CM

## 2017-04-13 DIAGNOSIS — Z723 Lack of physical exercise: Secondary | ICD-10-CM | POA: Diagnosis not present

## 2017-04-13 DIAGNOSIS — E785 Hyperlipidemia, unspecified: Secondary | ICD-10-CM

## 2017-04-13 DIAGNOSIS — I10 Essential (primary) hypertension: Secondary | ICD-10-CM | POA: Diagnosis not present

## 2017-04-13 NOTE — Progress Notes (Signed)
Subjective:   Jill Garza is a 66 y.o. female who presents for Medicare Annual (Subsequent) preventive examination.  Last AWV-03/18/2016    Objective:     Vitals: BP 110/64 (BP Location: Left Arm, Patient Position: Sitting)   Pulse 91   Temp 98.1 F (36.7 C) (Oral)   Ht 5\' 4"  (1.626 m)   Wt 226 lb (102.5 kg)   SpO2 97%   BMI 38.79 kg/m   Body mass index is 38.79 kg/m.  Advanced Directives 04/13/2017 10/21/2016 12/11/2015 09/04/2015 06/04/2015 04/15/2015 01/29/2015  Does Patient Have a Medical Advance Directive? No;Yes No No No No No No  Does patient want to make changes to medical advance directive? Yes (MAU/Ambulatory/Procedural Areas - Information given) - - - - - Yes - information given  Would patient like information on creating a medical advance directive? - - No - patient declined information Yes - Educational materials given No - patient declined information - -    Tobacco Social History   Tobacco Use  Smoking Status Former Smoker  . Years: 15.00  . Last attempt to quit: 05/09/1986  . Years since quitting: 30.9  Smokeless Tobacco Never Used     Counseling given: Not Answered   Clinical Intake:  Pre-visit preparation completed: No  Pain : No/denies pain     Nutritional Risks: None Diabetes: No  Activities of Daily Living: Independent Ambulation: Independent with device- listed below Home Assistive Devices/Equipment: Eyeglasses, Dentures (specify type)(partial, bridges) Medication Administration: Independent Home Management: Independent  Barriers to Care Management & Learning: None  Do you feel unsafe in your current relationship?: No Do you feel physically threatened by others?: No Anyone hurting you at home, work, or school?: No Unable to ask?: No Information provided on Community resources: No  How often do you need to have someone help you when you read instructions, pamphlets, or other written materials from your doctor or pharmacy?: 1 -  Never What is the last grade level you completed in school?: Masters Degrees  Interpreter Needed?: No  Information entered by :: Rich Reining, RN  Past Medical History:  Diagnosis Date  . Arthritis   . Cancer ([place])    melanoma- right leg  . Depression   . GERD (gastroesophageal reflux disease)   . History of hiatal hernia   . Hypercholesterolemia   . Hypertension   . Sweat, sweating, excessive   . Type 2 diabetes mellitus ([place])    Past Surgical History:  Procedure Laterality Date  . ABDOMINAL HYSTERECTOMY  1979  . APPENDECTOMY  1964  . BREAST LUMPECTOMY WITH RADIOACTIVE SEED LOCALIZATION Left 12/31/2014   Procedure: LEFT BREAST LUMPECTOMY WITH RADIOACTIVE SEED LOCALIZATION;  Surgeon: Donnie Mesa, MD;  Location: [place];  Service: General;  Laterality: Left;  . CARPAL TUNNEL RELEASE Right 1977  . CARPAL TUNNEL RELEASE Left 2007  . CATARACT EXTRACTION Left 04/13/2014   Dr. Herbert Deaner  . CATARACT EXTRACTION Right 06/10/2014   Dr. Herbert Deaner  . COLONOSCOPY  2009  . JOINT REPLACEMENT    . MELANOMA EXCISION    . TONSILLECTOMY  1962  . TOTAL KNEE ARTHROPLASTY Left 2012  . TOTAL KNEE ARTHROPLASTY Right 2008   Family History  Problem Relation Age of Onset  . Heart attack Father   . Pulmonary embolism Mother        Multiple, B/L   . Hypertension Brother   . Heart disease Brother   . Diabetes Brother   . High Cholesterol Brother   .  Hypertension Brother   . High Cholesterol Brother   . Hypertension Sister   . Diabetes type II Sister   . High Cholesterol Sister   . Alcohol abuse Brother   . Reye's syndrome Daughter   . Hepatitis Brother   . Stroke Unknown   . Cancer Maternal Grandmother 72  . Colon cancer Neg Hx   . Esophageal cancer Neg Hx   . Stomach cancer Neg Hx   . Rectal cancer Neg Hx    Social History   Socioeconomic History  . Marital status: Married    Spouse name: None  . Number of children: None  . Years of education: None  . Highest  education level: None  Social Needs  . Financial resource strain: Not hard at all  . Food insecurity - worry: Never true  . Food insecurity - inability: Never true  . Transportation needs - medical: No  . Transportation needs - non-medical: No  Occupational History  . None  Tobacco Use  . Smoking status: Former Smoker    Years: 15.00    Last attempt to quit: 05/09/1986    Years since quitting: 30.9  . Smokeless tobacco: Never Used  Substance and Sexual Activity  . Alcohol use: No    Alcohol/week: 0.0 oz    Comment: yearly  . Drug use: No  . Sexual activity: Yes  Other Topics Concern  . None  Social History Narrative  . None    Outpatient Encounter Medications as of 04/13/2017  Medication Sig  . clindamycin (CLEOCIN) 300 MG capsule Take 2 capsules prior to dental work  . fenofibrate (TRICOR) 145 MG tablet TAKE ONE TABLET BY MOUTH  DAILY  . glipiZIDE (GLUCOTROL) 5 MG tablet Take 1 tablet (5 mg total) 2 (two) times daily before a meal by mouth.  . losartan-hydrochlorothiazide (HYZAAR) 100-12.5 MG tablet TAKE 1 TABLET BY MOUTH ONCE DAILY  . omeprazole (PRILOSEC) 20 MG capsule TAKE 1 CAPSULE BY MOUTH ONCE DAILY  . PARoxetine (PAXIL) 30 MG tablet TAKE 1 TABLET BY MOUTH ONCE DAILY   No facility-administered encounter medications on file as of 04/13/2017.     Activities of Daily Living In your present state of health, do you have any difficulty performing the following activities: 04/13/2017  Hearing? Y  Vision? N  Difficulty concentrating or making decisions? Y  Walking or climbing stairs? N  Dressing or bathing? N  Doing errands, shopping? N  Preparing Food and eating ? N  Using the Toilet? N  In the past six months, have you accidently leaked urine? N  Do you have problems with loss of bowel control? N  Managing your Medications? N  Managing your Finances? N  Housekeeping or managing your Housekeeping? N  Some recent data might be hidden    Timed Get Up and Go  performed: 10 seconds to walk 10 feet-within normal limits  Patient Care Team: Gildardo Cranker, DO as PCP - General (Internal Medicine)    Assessment:    Exercise Activities and Dietary recommendations Current Exercise Habits: The patient does not participate in regular exercise at present, Exercise limited by: None identified  Goals      Patient Stated   . weight loss (pt-stated)     I will attempt to get under 210 lbs      Other   . Weight (lb) < 200 lb (90.7 kg)     Starting 03/18/16, I will attempt to get under 200 lbs.  Fall Risk Fall Risk  04/13/2017 03/18/2016 12/11/2015 09/04/2015 06/04/2015  Falls in the past year? No No No No No   Is the patient's home free of loose throw rugs in walkways, pet beds, electrical cords, etc?   yes      Grab bars in the bathroom? yes      Handrails on the stairs?   yes      Adequate lighting?   yes  Depression Screen PHQ 2/9 Scores 04/13/2017 03/18/2016 06/04/2015 12/17/2013  PHQ - 2 Score 0 0 0 0     Cognitive Function MMSE - Mini Mental State Exam 03/18/2016 01/29/2015  Orientation to time 5 5  Orientation to Place 5 5  Registration 3 3  Attention/ Calculation 5 5  Recall 3 1  Language- name 2 objects 2 2  Language- repeat 1 1  Language- follow 3 step command 3 3  Language- read & follow direction 1 1  Write a sentence 1 1  Copy design 1 1  Total score 30 28        Immunization History  Administered Date(s) Administered  . Influenza Whole 01/26/2013  . Influenza-Unspecified 01/28/2014, 02/18/2016  . Pneumococcal Conjugate-13 12/17/2013  . Pneumococcal Polysaccharide-23 04/09/2013  . Tdap 08/17/2009  . Zoster 01/09/2011   Screening Tests Health Maintenance  Topic Date Due  . HEMOGLOBIN A1C  04/20/2017  . OPHTHALMOLOGY EXAM  07/27/2017  . FOOT EXAM  10/21/2017  . PNA vac Low Risk Adult (2 of 2 - PPSV23) 04/09/2018  . MAMMOGRAM  05/31/2018  . TETANUS/TDAP  08/18/2019  . COLONOSCOPY  04/14/2020  . INFLUENZA  VACCINE  Completed  . DEXA SCAN  Completed  . Hepatitis C Screening  Completed   Cancer Screenings: Lung:  Low Dose CT Chest recommended if Age 67-80 years, 30 pack-year currently smoking OR have quit w/in 15years. Patient does not qualify. Breast:  Up to date on Mammogram? Yes  Up to date of Bone Density/Dexa? Yes Colorectal: up to date, due 04/14/2020  Additional Screenings: Hepatitis B/HIV/Syphillis:Declined Hepatitis C Screening: Declined     Plan:    I have personally reviewed and addressed the Medicare Annual Wellness questionnaire and have noted the following in the patient's chart:  A. Medical and social history B. Use of alcohol, tobacco or illicit drugs  C. Current medications and supplements D. Functional ability and status E.  Nutritional status F.  Physical activity G. Advance directives H. List of other physicians I.  Hospitalizations, surgeries, and ER visits in previous 12 months J.  Garza to include hearing, vision, cognitive, depression L. Referrals and appointments - none  In addition, I have reviewed and discussed with patient certain preventive protocols, quality metrics, and best practice recommendations. A written personalized care plan for preventive services as well as general preventive health recommendations were provided to patient.  See attached scanned questionnaire for additional information.   Signed,   Rich Reining, RN Nurse Health Advisor   Quick Notes   Health Maintenance: Pt waiting for pharmacy to get Shingrix in stock     Abnormal Screen: MMSE 30/30. Passed clock drawing     Patient Concerns: Requested Losartan review      Nurse Concerns: Silver sneakers

## 2017-04-13 NOTE — Patient Instructions (Signed)
[name] , Thank you for taking time to come for your Medicare Wellness Visit. I appreciate your ongoing commitment to your health goals. Please review the following plan we discussed and let me know if I can assist you in the future.   Screening recommendations/referrals: Colonoscopy up to date. Due 10/22/2019 Mammogram up to date. Due 10/22/2018 Bone Density up to date Recommended yearly ophthalmology/optometry visit for glaucoma screening and checkup Recommended yearly dental visit for hygiene and checkup  Vaccinations: Influenza vaccine up to date. Due 2019 fall season Pneumococcal vaccine up to date Tdap vaccine up to date. Due 08/18/2019 Shingles vaccine due, waiting for pharmacy to get them in stock  Advanced directives: Please give Jill Garza a copy of your living will and health care power of attorney when you fill them out  Conditions/risks identified: None  Next appointment: Dr. Eulas Post 04/18/2017 @ 9:45am           Jill Dense, RN 04/19/2018 @ 10am   Preventive Care 65 Years and Older, Female Preventive care refers to lifestyle choices and visits with your health care provider that can promote health and wellness. What does preventive care include?  A yearly physical exam. This is also called an annual well check.  Dental exams once or twice a year.  Routine eye exams. Ask your health care provider how often you should have your eyes checked.  Personal lifestyle choices, including:  Daily care of your teeth and gums.  Regular physical activity.  Eating a healthy diet.  Avoiding tobacco and drug use.  Limiting alcohol use.  Practicing safe sex.  Taking low-dose aspirin every day.  Taking vitamin and mineral supplements as recommended by your health care provider. What happens during an annual well check? The services and screenings done by your health care provider during your annual well check will depend on your age, overall health, lifestyle risk factors, and  family history of disease. Counseling  Your health care provider may ask you questions about your:  Alcohol use.  Tobacco use.  Drug use.  Emotional well-being.  Home and relationship well-being.  Sexual activity.  Eating habits.  History of falls.  Memory and ability to understand (cognition).  Work and work Statistician.  Reproductive health. Screening  You may have the following tests or measurements:  Height, weight, and BMI.  Blood pressure.  Lipid and cholesterol levels. These may be checked every 5 years, or more frequently if you are over 61 years old.  Skin check.  Lung cancer screening. You may have this screening every year starting at age 66 if you have a 30-pack-year history of smoking and currently smoke or have quit within the past 15 years.  Fecal occult blood test (FOBT) of the stool. You may have this test every year starting at age 60.  Flexible sigmoidoscopy or colonoscopy. You may have a sigmoidoscopy every 5 years or a colonoscopy every 10 years starting at age 55.  Hepatitis C blood test.  Hepatitis B blood test.  Sexually transmitted disease (STD) testing.  Diabetes screening. This is done by checking your blood sugar (glucose) after you have not eaten for a while (fasting). You may have this done every 1-3 years.  Bone density scan. This is done to screen for osteoporosis. You may have this done starting at age 73.  Mammogram. This may be done every 1-2 years. Talk to your health care provider about how often you should have regular mammograms. Talk with your health care provider about your  test results, treatment options, and if necessary, the need for more tests. Vaccines  Your health care provider may recommend certain vaccines, such as:  Influenza vaccine. This is recommended every year.  Tetanus, diphtheria, and acellular pertussis (Tdap, Td) vaccine. You may need a Td booster every 10 years.  Zoster vaccine. You may need this  after age 66.  Pneumococcal 13-valent conjugate (PCV13) vaccine. One dose is recommended after age 87.  Pneumococcal polysaccharide (PPSV23) vaccine. One dose is recommended after age 73. Talk to your health care provider about which screenings and vaccines you need and how often you need them. This information is not intended to replace advice given to you by your health care provider. Make sure you discuss any questions you have with your health care provider. Document Released: 05/22/2015 Document Revised: 01/13/2016 Document Reviewed: 02/24/2015 Elsevier Interactive Patient Education  2017 [employer] Prevention in the Home Falls can cause injuries. They can happen to people of all ages. There are many things you can do to make your home safe and to help prevent falls. What can I do on the outside of my home?  Regularly fix the edges of walkways and driveways and fix any cracks.  Remove anything that might make you trip as you walk through a door, such as a raised step or threshold.  Trim any bushes or trees on the path to your home.  Use bright outdoor lighting.  Clear any walking paths of anything that might make someone trip, such as rocks or tools.  Regularly check to see if handrails are loose or broken. Make sure that both sides of any steps have handrails.  Any raised decks and porches should have guardrails on the edges.  Have any leaves, snow, or ice cleared regularly.  Use sand or salt on walking paths during winter.  Clean up any spills in your garage right away. This includes oil or grease spills. What can I do in the bathroom?  Use night lights.  Install grab bars by the toilet and in the tub and shower. Do not use towel bars as grab bars.  Use non-skid mats or decals in the tub or shower.  If you need to sit down in the shower, use a plastic, non-slip stool.  Keep the floor dry. Clean up any water that spills on the floor as soon as it  happens.  Remove soap buildup in the tub or shower regularly.  Attach bath mats securely with double-sided non-slip rug tape.  Do not have throw rugs and other things on the floor that can make you trip. What can I do in the bedroom?  Use night lights.  Make sure that you have a light by your bed that is easy to reach.  Do not use any sheets or blankets that are too big for your bed. They should not hang down onto the floor.  Have a firm chair that has side arms. You can use this for support while you get dressed.  Do not have throw rugs and other things on the floor that can make you trip. What can I do in the kitchen?  Clean up any spills right away.  Avoid walking on wet floors.  Keep items that you use a lot in easy-to-reach places.  If you need to reach something above you, use a strong step stool that has a grab bar.  Keep electrical cords out of the way.  Do not use floor polish or wax that  makes floors slippery. If you must use wax, use non-skid floor wax.  Do not have throw rugs and other things on the floor that can make you trip. What can I do with my stairs?  Do not leave any items on the stairs.  Make sure that there are handrails on both sides of the stairs and use them. Fix handrails that are broken or loose. Make sure that handrails are as long as the stairways.  Check any carpeting to make sure that it is firmly attached to the stairs. Fix any carpet that is loose or worn.  Avoid having throw rugs at the top or bottom of the stairs. If you do have throw rugs, attach them to the floor with carpet tape.  Make sure that you have a light switch at the top of the stairs and the bottom of the stairs. If you do not have them, ask someone to add them for you. What else can I do to help prevent falls?  Wear shoes that:  Do not have high heels.  Have rubber bottoms.  Are comfortable and fit you well.  Are closed at the toe. Do not wear sandals.  If you  use a stepladder:  Make sure that it is fully opened. Do not climb a closed stepladder.  Make sure that both sides of the stepladder are locked into place.  Ask someone to hold it for you, if possible.  Clearly mark and make sure that you can see:  Any grab bars or handrails.  First and last steps.  Where the edge of each step is.  Use tools that help you move around (mobility aids) if they are needed. These include:  Canes.  Walkers.  Scooters.  Crutches.  Turn on the lights when you go into a dark area. Replace any light bulbs as soon as they burn out.  Set up your furniture so you have a clear path. Avoid moving your furniture around.  If any of your floors are uneven, fix them.  If there are any pets around you, be aware of where they are.  Review your medicines with your doctor. Some medicines can make you feel dizzy. This can increase your chance of falling. Ask your doctor what other things that you can do to help prevent falls. This information is not intended to replace advice given to you by your health care provider. Make sure you discuss any questions you have with your health care provider. Document Released: 02/19/2009 Document Revised: 10/01/2015 Document Reviewed: 05/30/2014 Elsevier Interactive Patient Education  2017 [employer].

## 2017-04-14 LAB — CBC WITH DIFFERENTIAL/PLATELET
Basophils Absolute: 48 cells/uL (ref 0–200)
Basophils Relative: 0.9 %
Eosinophils Absolute: 58 cells/uL (ref 15–500)
Eosinophils Relative: 1.1 %
HEMATOCRIT: 43.1 % (ref 35.0–45.0)
HEMOGLOBIN: 14.3 g/dL (ref 11.7–15.5)
Lymphs Abs: 2470 cells/uL (ref 850–3900)
MCH: 28.5 pg (ref 27.0–33.0)
MCHC: 33.2 g/dL (ref 32.0–36.0)
MCV: 86 fL (ref 80.0–100.0)
MPV: 9.4 fL (ref 7.5–12.5)
Monocytes Relative: 5.3 %
Neutro Abs: 2443 cells/uL (ref 1500–7800)
Neutrophils Relative %: 46.1 %
Platelets: 343 10*3/uL (ref 140–400)
RBC: 5.01 10*6/uL (ref 3.80–5.10)
RDW: 12.9 % (ref 11.0–15.0)
Total Lymphocyte: 46.6 %
WBC mixed population: 281 cells/uL (ref 200–950)
WBC: 5.3 10*3/uL (ref 3.8–10.8)

## 2017-04-14 LAB — LIPID PANEL
Cholesterol: 171 mg/dL (ref <200)
HDL: 49 mg/dL — ABNORMAL LOW (ref >50)
LDL Cholesterol (Calc): 98 mg/dL (calc)
NON-HDL CHOLESTEROL (CALC): 122 mg/dL (ref <130)
Total CHOL/HDL Ratio: 3.5 (calc) (ref <5.0)
Triglycerides: 140 mg/dL (ref <150)

## 2017-04-14 LAB — HEMOGLOBIN A1C
HEMOGLOBIN A1C: 6.3 %{Hb} — AB (ref <5.7)
MEAN PLASMA GLUCOSE: 134 (calc)
eAG (mmol/L): 7.4 (calc)

## 2017-04-14 LAB — URINALYSIS, ROUTINE W REFLEX MICROSCOPIC
Bilirubin Urine: NEGATIVE
Glucose, UA: NEGATIVE
Hgb urine dipstick: NEGATIVE
Hyaline Cast: NONE SEEN /LPF
Ketones, ur: NEGATIVE
NITRITE: NEGATIVE
SPECIFIC GRAVITY, URINE: 1.024 (ref 1.001–1.03)
pH: 5.5 (ref 5.0–8.0)

## 2017-04-14 LAB — COMPLETE METABOLIC PANEL WITH GFR
AG RATIO: 1.5 (calc) (ref 1.0–2.5)
ALT: 20 U/L (ref 6–29)
AST: 21 U/L (ref 10–35)
Albumin: 4.4 g/dL (ref 3.6–5.1)
Alkaline phosphatase (APISO): 44 U/L (ref 33–130)
BUN/Creatinine Ratio: 16 (calc) (ref 6–22)
BUN: 22 mg/dL (ref 7–25)
CALCIUM: 10.7 mg/dL — AB (ref 8.6–10.4)
CO2: 28 mmol/L (ref 20–32)
CREATININE: 1.4 mg/dL — AB (ref 0.50–0.99)
Chloride: 102 mmol/L (ref 98–110)
GFR, EST NON AFRICAN AMERICAN: 39 mL/min/{1.73_m2} — AB (ref >=60)
GFR, Est African American: 45 mL/min/{1.73_m2} — ABNORMAL LOW (ref >=60)
Globulin: 2.9 g/dL (calc) (ref 1.9–3.7)
Glucose, Bld: 126 mg/dL — ABNORMAL HIGH (ref 65–99)
POTASSIUM: 4.4 mmol/L (ref 3.5–5.3)
Sodium: 139 mmol/L (ref 135–146)
Total Bilirubin: 0.4 mg/dL (ref 0.2–1.2)
Total Protein: 7.3 g/dL (ref 6.1–8.1)

## 2017-04-14 LAB — TSH: TSH: 2 m[IU]/L (ref 0.40–4.50)

## 2017-04-14 LAB — MICROALBUMIN / CREATININE URINE RATIO
CREATININE, URINE: 212 mg/dL (ref 20–275)
MICROALB UR: 9.7 mg/dL
Microalb Creat Ratio: 46 mcg/mg creat — ABNORMAL HIGH (ref <30)

## 2017-04-18 ENCOUNTER — Encounter: Payer: Self-pay | Admitting: Internal Medicine

## 2017-04-18 ENCOUNTER — Ambulatory Visit: Payer: Medicare Other | Admitting: Internal Medicine

## 2017-04-18 ENCOUNTER — Ambulatory Visit (INDEPENDENT_AMBULATORY_CARE_PROVIDER_SITE_OTHER): Payer: Medicare Other | Admitting: Internal Medicine

## 2017-04-18 VITALS — BP 110/62 | HR 87 | Temp 98.8°F | Ht 64.0 in | Wt 228.0 lb

## 2017-04-18 DIAGNOSIS — E669 Obesity, unspecified: Secondary | ICD-10-CM

## 2017-04-18 DIAGNOSIS — I10 Essential (primary) hypertension: Secondary | ICD-10-CM | POA: Diagnosis not present

## 2017-04-18 DIAGNOSIS — E1122 Type 2 diabetes mellitus with diabetic chronic kidney disease: Secondary | ICD-10-CM | POA: Diagnosis not present

## 2017-04-18 DIAGNOSIS — H6692 Otitis media, unspecified, left ear: Secondary | ICD-10-CM

## 2017-04-18 MED ORDER — CEFUROXIME AXETIL 250 MG PO TABS: 250.0000 mg | ORAL_TABLET | Freq: Two times a day (BID) | ORAL | 0 refills | Status: DC

## 2017-04-18 NOTE — Patient Instructions (Addendum)
Recommend weight loss med that also treat diabetes such as byetta or victoza (would take instead of glipizide)  START CEFTIN 250MG  2 TIMES DAILY X 10 DAYS FOR EAR INFECTION  TAKE PROBIOTIC DAILY WHILE ON ANTIBIOTIC  Continue other medications as ordered  Recommend watch fatty foods and increase exercise as tolerated  Follow up in 6 mos for DM, obesity, HTN, hyperlipidemia. Fasting labs prior to appt

## 2017-04-18 NOTE — Progress Notes (Signed)
Patient ID: Jill Garza, female   DOB: 11/27/1950, 66 y.o.   MRN: [id number]   Location:  [facility]  Place of Service:  OFFICE  Provider: Arletha Grippe, DO  Patient Care Team: Gildardo Cranker, DO as PCP - General (Internal Medicine)  Extended Emergency Contact Information Primary Emergency Contact: [facility] Address: [address] [place], [place] [address] [name] of [place] Phone: [phone] Mobile Phone: [phone] Relation: Spouse  Code Status:  Goals of Care: Advanced Directive information Advanced Directives 04/13/2017  Does Patient Have a Medical Advance Directive? No;Yes  Does patient want to make changes to medical advance directive? Yes (MAU/Ambulatory/Procedural Areas - Information given)  Would patient like information on creating a medical advance directive? -     Chief Complaint  Patient presents with  . Medical Management of Chronic Issues    Extended visit, AWV completed on 04/13/17 and discuss labs (copy printed)   . Medication Refill    No refills needed     HPI: Patient is a 66 y.o. female seen in today for an extended visit.  Reviewed AWV note from 04/13/17. She c/o dizziness with positional changes. Sx's resolve after a few seconds.   Lab results reviewed with pt  She prefers to hold off on trying statin tx due to c/a cramping in hands. She tried zocor 20mg  in the past but cramping was worse and she was taking off of med.   DM - BS 120-130s. Rare low BS reaction. She takes glipizide. metformin stopped due to worsening renal fxn. A1c 6.3%. Cr 1.4; urine microalbumin/Cr 46  Hyperlipidemia - stable on fenofibrate. LDL 98; TG 140; HDL 49  Depression - mood stable on paxil. No SI/HI  GERD - controlled on omeprazole  HTN - controlled on losartan hct  Wheezing/cough - stable on prn HFA. No formal PFTs. Past hx tobacco abuse  CKD - stage 3. Slightly worse probably 2/2 wt gain/obesity. Cr 1.40 (range 1.01 - 1.34)  Obesity -  weight increased 6 lbs since June 2018. She admits to poor food choices and no regular exercise rotuine   Depression screen [facility] 2/9 04/13/2017 03/18/2016 06/04/2015 12/17/2013  Decreased Interest 0 0 0 0  Down, Depressed, Hopeless 0 0 0 0  PHQ - 2 Score 0 0 0 0    Fall Risk  04/18/2017 04/13/2017 03/18/2016 12/11/2015 09/04/2015  Falls in the past year? No No No No No   MMSE - Mini Mental State Exam 04/13/2017 03/18/2016 01/29/2015  Orientation to time 5 5 5   Orientation to Place 5 5 5   Registration 3 3 3   Attention/ Calculation 5 5 5   Recall 3 3 1   Language- name 2 objects 2 2 2   Language- repeat 1 1 1   Language- follow 3 step command 3 3 3   Language- read & follow direction 1 1 1   Write a sentence 1 1 1   Copy design 1 1 1   Total score 30 30 28      Health Maintenance  Topic Date Due  . OPHTHALMOLOGY EXAM  07/27/2017  . HEMOGLOBIN A1C  10/12/2017  . FOOT EXAM  10/21/2017  . PNA vac Low Risk Adult (2 of 2 - PPSV23) 04/09/2018  . MAMMOGRAM  05/31/2018  . TETANUS/TDAP  08/18/2019  . COLONOSCOPY  04/14/2020  . INFLUENZA VACCINE  Completed  . DEXA SCAN  Completed  . Hepatitis C Screening  Completed    Past Medical History:  Diagnosis Date  .  Arthritis   . Cancer ([place])    melanoma- right leg  . Depression   . GERD (gastroesophageal reflux disease)   . History of hiatal hernia   . Hypercholesterolemia   . Hypertension   . Sweat, sweating, excessive   . Type 2 diabetes mellitus ([place])     Past Surgical History:  Procedure Laterality Date  . ABDOMINAL HYSTERECTOMY  1979  . APPENDECTOMY  1964  . BREAST LUMPECTOMY WITH RADIOACTIVE SEED LOCALIZATION Left 12/31/2014   Procedure: LEFT BREAST LUMPECTOMY WITH RADIOACTIVE SEED LOCALIZATION;  Surgeon: Donnie Mesa, MD;  Location: [facility];  Service: General;  Laterality: Left;  . CARPAL TUNNEL RELEASE Right 1977  . CARPAL TUNNEL RELEASE Left 2007  . CATARACT EXTRACTION Left 04/13/2014   Dr. Herbert Deaner  . CATARACT  EXTRACTION Right 06/10/2014   Dr. Herbert Deaner  . COLONOSCOPY  2009  . JOINT REPLACEMENT    . MELANOMA EXCISION    . TONSILLECTOMY  1962  . TOTAL KNEE ARTHROPLASTY Left 2012  . TOTAL KNEE ARTHROPLASTY Right 2008    Family History  Problem Relation Age of Onset  . Heart attack Father   . Pulmonary embolism Mother        Multiple, B/L   . Hypertension Brother   . Heart disease Brother   . Diabetes Brother   . High Cholesterol Brother   . Hypertension Brother   . High Cholesterol Brother   . Hypertension Sister   . Diabetes type II Sister   . High Cholesterol Sister   . Alcohol abuse Brother   . Reye's syndrome Daughter   . Hepatitis Brother   . Stroke Unknown   . Cancer Maternal Grandmother 79  . Colon cancer Neg Hx   . Esophageal cancer Neg Hx   . Stomach cancer Neg Hx   . Rectal cancer Neg Hx    Family Status  Relation Name Status  . Father  Deceased at age 68  . Mother  Deceased at age 69  . Brother [employer]  . Brother [employer]  . Sister Burman Foster  . Brother [employer]  . Daughter Anderson Malta Deceased at age 75  . Daughter Lorna Few  . Daughter Corey Harold  . Brother Jori Moll  (Not Specified)  . Unknown  (Not Specified)  . MGM  (Not Specified)  . Neg Hx  (Not Specified)    Social History   Socioeconomic History  . Marital status: Married    Spouse name: Not on file  . Number of children: Not on file  . Years of education: Not on file  . Highest education level: Not on file  Social Needs  . Financial resource strain: Not hard at all  . Food insecurity - worry: Never true  . Food insecurity - inability: Never true  . Transportation needs - medical: No  . Transportation needs - non-medical: No  Occupational History  . Not on file  Tobacco Use  . Smoking status: Former Smoker    Years: 15.00    Last attempt to quit: 05/09/1986    Years since quitting: 30.9  . Smokeless tobacco: Never Used  Substance and Sexual Activity  . Alcohol use: No     Alcohol/week: 0.0 oz    Comment: yearly  . Drug use: No  . Sexual activity: Yes  Other Topics Concern  . Not on file  Social History Narrative  . Not on file    Allergies  Allergen Reactions  . Penicillins  Rash   . Ivp Dye [Iodinated Diagnostic Agents]     Hives   . Percocet [Oxycodone-Acetaminophen]     Itching     Allergies as of 04/18/2017      Reactions   Penicillins    Rash    Ivp Dye [iodinated Diagnostic Agents]    Hives    Percocet [oxycodone-acetaminophen]    Itching       Medication List        Accurate as of 04/18/17 12:05 PM. Always use your most recent med list.          clindamycin 300 MG capsule Commonly known as:  CLEOCIN Take 2 capsules prior to dental work   fenofibrate 145 MG tablet Commonly known as:  TRICOR TAKE ONE TABLET BY MOUTH  DAILY   glipiZIDE 5 MG tablet Commonly known as:  GLUCOTROL Take 1 tablet (5 mg total) 2 (two) times daily before a meal by mouth.   losartan-hydrochlorothiazide 100-12.5 MG tablet Commonly known as:  HYZAAR TAKE 1 TABLET BY MOUTH ONCE DAILY   omeprazole 20 MG capsule Commonly known as:  PRILOSEC TAKE 1 CAPSULE BY MOUTH ONCE DAILY   PARoxetine 30 MG tablet Commonly known as:  PAXIL TAKE 1 TABLET BY MOUTH ONCE DAILY        Review of Systems:  Review of Systems  HENT: Positive for hearing loss (sounds muffled).   Neurological: Positive for dizziness.  All other systems reviewed and are negative.   Physical Exam: Vitals:   04/18/17 1148  BP: 110/62  Pulse: 87  Temp: 98.8 F (37.1 C)  TempSrc: Oral  SpO2: 94%  Weight: 228 lb (103.4 kg)  Height: 5\' 4"  (1.626 m)   Body mass index is 39.14 kg/m. Physical Exam  Constitutional: She is oriented to person, place, and time. She appears well-developed and well-nourished. No distress.  HENT:  Head: Normocephalic and atraumatic.  Right Ear: Hearing, external ear and ear canal normal. Tympanic membrane is not erythematous, not retracted  and not bulging.  Left Ear: External ear normal. No drainage or tenderness. Tympanic membrane is erythematous (with dull appearance). Tympanic membrane is not retracted and not bulging.  No middle ear effusion.  Mouth/Throat: Oropharynx is clear and moist. No oropharyngeal exudate.  MMM; no oral thrush  Eyes: EOM are normal. Pupils are equal, round, and reactive to light. No scleral icterus.  Neck: Normal range of motion. Neck supple. Carotid bruit is not present. No tracheal deviation present. No thyromegaly present.  Cardiovascular: Normal rate, regular rhythm and intact distal pulses. Exam reveals no gallop and no friction rub.  No murmur heard. No LE edema b/l. No calf TTP  Pulmonary/Chest: Effort normal and breath sounds normal. No respiratory distress. She has no wheezes. She has no rales. She exhibits no tenderness. Right breast exhibits no inverted nipple, no mass, no nipple discharge, no skin change and no tenderness. Left breast exhibits no inverted nipple, no mass, no nipple discharge, no skin change and no tenderness. Breasts are asymmetrical (left breast smaller; incisonal scar present).  No rhonchi  Abdominal: Soft. Bowel sounds are normal. She exhibits no distension and no mass. There is no hepatosplenomegaly or hepatomegaly. There is no tenderness. There is no rebound and no guarding. No hernia.  obese  Genitourinary:  Genitourinary Comments: Deferred to GYN  Musculoskeletal: She exhibits edema. She exhibits no deformity.  Lymphadenopathy:    She has no cervical adenopathy.  Neurological: She is alert and oriented to person, place, and time.  She has normal reflexes.  Skin: Skin is warm and dry. No rash noted.  (+) tattoos  Psychiatric: She has a normal mood and affect. Her behavior is normal. Judgment and thought content normal.  Vitals reviewed.  Diabetic Foot Exam - Simple   Simple Foot Form Diabetic Foot exam was performed with the following findings:  Yes 04/18/2017   1:06 PM  Visual Inspection No deformities, no ulcerations, no other skin breakdown bilaterally:  Yes Sensation Testing Intact to touch and monofilament testing bilaterally:  Yes Pulse Check Posterior Tibialis and Dorsalis pulse intact bilaterally:  Yes Comments No ulcerations or calluses       Labs reviewed:  Basic Metabolic Panel: Recent Labs    10/19/16 0828 04/13/17 0858  NA 139 139  K 4.6 4.4  CL 103 102  CO2 27 28  GLUCOSE 118* 126*  BUN 22 22  CREATININE 1.34* 1.40*  CALCIUM 10.2 10.7*  TSH  --  2.00   Liver Function Tests: Recent Labs    10/19/16 0828 04/13/17 0858  AST 19 21  ALT 18 20  ALKPHOS 44  --   BILITOT 0.5 0.4  PROT 7.1 7.3  ALBUMIN 4.3  --    No results for input(s): LIPASE, AMYLASE in the last 8760 hours. No results for input(s): AMMONIA in the last 8760 hours. CBC: Recent Labs    04/13/17 0858  WBC 5.3  NEUTROABS 2,443  HGB 14.3  HCT 43.1  MCV 86.0  PLT 343   Lipid Panel: Recent Labs    10/19/16 0828 04/13/17 0858  CHOL 162 171  HDL 45* 49*  LDLCALC 84  --   TRIG 164* 140  CHOLHDL 3.6 3.5   Lab Results  Component Value Date   HGBA1C 6.3 (H) 04/13/2017    Procedures: No results found.  Assessment/Plan   ICD-10-CM   1. Left otitis media, unspecified otitis media type H66.92 cefUROXime (CEFTIN) 250 MG tablet  2. Obesity (BMI 30-39.9) E66.9   3. Essential hypertension, benign I10   4. Controlled type 2 diabetes mellitus with chronic kidney disease, without long-term current use of insulin, unspecified CKD stage (HCC) E11.22    Recommend weight loss med that also treat diabetes such as byetta or victoza (would take instead of glipizide)  START CEFTIN 250MG  2 TIMES DAILY X 10 DAYS FOR EAR INFECTION  TAKE PROBIOTIC DAILY WHILE ON ANTIBIOTIC  Continue other medications as ordered  Recommend watch fatty foods and increase exercise as tolerated  Follow up in 6 mos for DM, obesity, HTN, hyperlipidemia. Fasting labs  prior to appt    Jill Garza  [facility] [address] [place], [place] [address] [phone] Cell (Monday-Friday 8 AM - 5 PM) [phone] After 5 PM and follow prompts

## 2017-04-21 DIAGNOSIS — E669 Obesity, unspecified: Secondary | ICD-10-CM | POA: Insufficient documentation

## 2017-05-03 ENCOUNTER — Other Ambulatory Visit: Payer: Self-pay | Admitting: Nurse Practitioner

## 2017-05-03 ENCOUNTER — Other Ambulatory Visit: Payer: Self-pay | Admitting: Internal Medicine

## 2017-06-04 ENCOUNTER — Other Ambulatory Visit: Payer: Self-pay | Admitting: Nurse Practitioner

## 2017-06-19 DIAGNOSIS — H1852 Epithelial (juvenile) corneal dystrophy: Secondary | ICD-10-CM | POA: Diagnosis not present

## 2017-06-19 LAB — HM DIABETES EYE EXAM

## 2017-07-08 DIAGNOSIS — Z91041 Radiographic dye allergy status: Secondary | ICD-10-CM | POA: Diagnosis not present

## 2017-07-08 DIAGNOSIS — Z880 Allergy status to penicillin: Secondary | ICD-10-CM | POA: Diagnosis not present

## 2017-07-08 DIAGNOSIS — E785 Hyperlipidemia, unspecified: Secondary | ICD-10-CM | POA: Diagnosis not present

## 2017-07-08 DIAGNOSIS — Z885 Allergy status to narcotic agent status: Secondary | ICD-10-CM | POA: Diagnosis not present

## 2017-07-08 DIAGNOSIS — R944 Abnormal results of kidney function studies: Secondary | ICD-10-CM | POA: Diagnosis not present

## 2017-07-08 DIAGNOSIS — E860 Dehydration: Secondary | ICD-10-CM | POA: Diagnosis not present

## 2017-07-08 DIAGNOSIS — K828 Other specified diseases of gallbladder: Secondary | ICD-10-CM | POA: Diagnosis not present

## 2017-07-08 DIAGNOSIS — K8050 Calculus of bile duct without cholangitis or cholecystitis without obstruction: Secondary | ICD-10-CM | POA: Diagnosis not present

## 2017-07-08 DIAGNOSIS — I10 Essential (primary) hypertension: Secondary | ICD-10-CM | POA: Diagnosis not present

## 2017-07-08 DIAGNOSIS — K760 Fatty (change of) liver, not elsewhere classified: Secondary | ICD-10-CM | POA: Diagnosis not present

## 2017-07-08 DIAGNOSIS — E119 Type 2 diabetes mellitus without complications: Secondary | ICD-10-CM | POA: Diagnosis not present

## 2017-07-14 ENCOUNTER — Emergency Department (HOSPITAL_BASED_OUTPATIENT_CLINIC_OR_DEPARTMENT_OTHER): Payer: Medicare Other

## 2017-07-14 ENCOUNTER — Encounter (HOSPITAL_COMMUNITY)
Admission: EM | Disposition: A | Discharge status: Home / Self Care | Payer: Self-pay | Source: Home / Self Care | Attending: Emergency Medicine

## 2017-07-14 ENCOUNTER — Other Ambulatory Visit: Payer: Self-pay

## 2017-07-14 ENCOUNTER — Observation Stay (HOSPITAL_COMMUNITY): Payer: Medicare Other | Admitting: Anesthesiology

## 2017-07-14 ENCOUNTER — Observation Stay (HOSPITAL_COMMUNITY): Payer: Medicare Other

## 2017-07-14 ENCOUNTER — Observation Stay (HOSPITAL_BASED_OUTPATIENT_CLINIC_OR_DEPARTMENT_OTHER)
Admission: EM | Admit: 2017-07-14 | Discharge: 2017-07-15 | Disposition: A | Discharge status: Home / Self Care | Payer: Medicare Other | Attending: Surgery | Admitting: Surgery

## 2017-07-14 ENCOUNTER — Encounter (HOSPITAL_BASED_OUTPATIENT_CLINIC_OR_DEPARTMENT_OTHER): Payer: Self-pay | Admitting: Emergency Medicine

## 2017-07-14 DIAGNOSIS — E7800 Pure hypercholesterolemia, unspecified: Secondary | ICD-10-CM | POA: Insufficient documentation

## 2017-07-14 DIAGNOSIS — Z87891 Personal history of nicotine dependence: Secondary | ICD-10-CM | POA: Diagnosis not present

## 2017-07-14 DIAGNOSIS — K760 Fatty (change of) liver, not elsewhere classified: Secondary | ICD-10-CM | POA: Diagnosis not present

## 2017-07-14 DIAGNOSIS — I10 Essential (primary) hypertension: Secondary | ICD-10-CM | POA: Insufficient documentation

## 2017-07-14 DIAGNOSIS — E785 Hyperlipidemia, unspecified: Secondary | ICD-10-CM | POA: Insufficient documentation

## 2017-07-14 DIAGNOSIS — K8000 Calculus of gallbladder with acute cholecystitis without obstruction: Principal | ICD-10-CM | POA: Insufficient documentation

## 2017-07-14 DIAGNOSIS — F329 Major depressive disorder, single episode, unspecified: Secondary | ICD-10-CM | POA: Diagnosis not present

## 2017-07-14 DIAGNOSIS — K810 Acute cholecystitis: Secondary | ICD-10-CM | POA: Diagnosis not present

## 2017-07-14 DIAGNOSIS — E119 Type 2 diabetes mellitus without complications: Secondary | ICD-10-CM | POA: Insufficient documentation

## 2017-07-14 DIAGNOSIS — K819 Cholecystitis, unspecified: Secondary | ICD-10-CM | POA: Diagnosis present

## 2017-07-14 DIAGNOSIS — K219 Gastro-esophageal reflux disease without esophagitis: Secondary | ICD-10-CM | POA: Insufficient documentation

## 2017-07-14 DIAGNOSIS — Z7984 Long term (current) use of oral hypoglycemic drugs: Secondary | ICD-10-CM | POA: Insufficient documentation

## 2017-07-14 DIAGNOSIS — N189 Chronic kidney disease, unspecified: Secondary | ICD-10-CM | POA: Diagnosis not present

## 2017-07-14 DIAGNOSIS — Z79899 Other long term (current) drug therapy: Secondary | ICD-10-CM | POA: Diagnosis not present

## 2017-07-14 DIAGNOSIS — Z853 Personal history of malignant neoplasm of breast: Secondary | ICD-10-CM | POA: Insufficient documentation

## 2017-07-14 DIAGNOSIS — Z6834 Body mass index (BMI) 34.0-34.9, adult: Secondary | ICD-10-CM | POA: Insufficient documentation

## 2017-07-14 DIAGNOSIS — Z85820 Personal history of malignant melanoma of skin: Secondary | ICD-10-CM | POA: Insufficient documentation

## 2017-07-14 DIAGNOSIS — Z96653 Presence of artificial knee joint, bilateral: Secondary | ICD-10-CM | POA: Diagnosis not present

## 2017-07-14 DIAGNOSIS — E1122 Type 2 diabetes mellitus with diabetic chronic kidney disease: Secondary | ICD-10-CM | POA: Diagnosis not present

## 2017-07-14 DIAGNOSIS — R109 Unspecified abdominal pain: Secondary | ICD-10-CM | POA: Diagnosis present

## 2017-07-14 DIAGNOSIS — R1011 Right upper quadrant pain: Secondary | ICD-10-CM

## 2017-07-14 DIAGNOSIS — R112 Nausea with vomiting, unspecified: Secondary | ICD-10-CM | POA: Diagnosis not present

## 2017-07-14 DIAGNOSIS — K5730 Diverticulosis of large intestine without perforation or abscess without bleeding: Secondary | ICD-10-CM | POA: Diagnosis not present

## 2017-07-14 DIAGNOSIS — I129 Hypertensive chronic kidney disease with stage 1 through stage 4 chronic kidney disease, or unspecified chronic kidney disease: Secondary | ICD-10-CM | POA: Diagnosis not present

## 2017-07-14 HISTORY — PX: CHOLECYSTECTOMY: SHX55

## 2017-07-14 LAB — COMPREHENSIVE METABOLIC PANEL
ALBUMIN: 4.4 g/dL (ref 3.5–5.0)
ALK PHOS: 46 U/L (ref 38–126)
ALT: 20 U/L (ref 14–54)
ANION GAP: 11 (ref 5–15)
AST: 22 U/L (ref 15–41)
BILIRUBIN TOTAL: 0.3 mg/dL (ref 0.3–1.2)
BUN: 24 mg/dL — AB (ref 6–20)
CO2: 21 mmol/L — AB (ref 22–32)
Calcium: 10.4 mg/dL — ABNORMAL HIGH (ref 8.9–10.3)
Chloride: 102 mmol/L (ref 101–111)
Creatinine, Ser: 1.3 mg/dL — ABNORMAL HIGH (ref 0.44–1.00)
GFR calc Af Amer: 48 mL/min — ABNORMAL LOW (ref >60)
GFR calc non Af Amer: 41 mL/min — ABNORMAL LOW (ref >60)
GLUCOSE: 147 mg/dL — AB (ref 65–99)
POTASSIUM: 3.9 mmol/L (ref 3.5–5.1)
SODIUM: 134 mmol/L — AB (ref 135–145)
Total Protein: 7.9 g/dL (ref 6.5–8.1)

## 2017-07-14 LAB — URINALYSIS, ROUTINE W REFLEX MICROSCOPIC
Bilirubin Urine: NEGATIVE
GLUCOSE, UA: NEGATIVE mg/dL
Hgb urine dipstick: NEGATIVE
Ketones, ur: NEGATIVE mg/dL
LEUKOCYTES UA: NEGATIVE
NITRITE: NEGATIVE
PH: 5.5 (ref 5.0–8.0)
Protein, ur: NEGATIVE mg/dL
Specific Gravity, Urine: >1.03 — ABNORMAL HIGH (ref 1.005–1.030)

## 2017-07-14 LAB — CBC WITH DIFFERENTIAL/PLATELET
BASOS ABS: 0 10*3/uL (ref 0.0–0.1)
BASOS PCT: 0 %
EOS ABS: 0 10*3/uL (ref 0.0–0.7)
Eosinophils Relative: 0 %
HEMATOCRIT: 40.9 % (ref 36.0–46.0)
HEMOGLOBIN: 13.7 g/dL (ref 12.0–15.0)
Lymphocytes Relative: 19 %
Lymphs Abs: 1.5 10*3/uL (ref 0.7–4.0)
MCH: 28.8 pg (ref 26.0–34.0)
MCHC: 33.5 g/dL (ref 30.0–36.0)
MCV: 85.9 fL (ref 78.0–100.0)
MONOS PCT: 3 %
Monocytes Absolute: 0.2 10*3/uL (ref 0.1–1.0)
NEUTROS ABS: 6.2 10*3/uL (ref 1.7–7.7)
NEUTROS PCT: 78 %
Platelets: 303 10*3/uL (ref 150–400)
RBC: 4.76 MIL/uL (ref 3.87–5.11)
RDW: 12.2 % (ref 11.5–15.5)
WBC: 7.9 10*3/uL (ref 4.0–10.5)

## 2017-07-14 LAB — LIPASE, BLOOD: Lipase: 28 U/L (ref 11–51)

## 2017-07-14 LAB — MRSA PCR SCREENING: MRSA by PCR: NEGATIVE

## 2017-07-14 LAB — GLUCOSE, CAPILLARY
GLUCOSE-CAPILLARY: 117 mg/dL — AB (ref 65–99)
GLUCOSE-CAPILLARY: 146 mg/dL — AB (ref 65–99)
Glucose-Capillary: 95 mg/dL (ref 65–99)

## 2017-07-14 SURGERY — LAPAROSCOPIC CHOLECYSTECTOMY WITH INTRAOPERATIVE CHOLANGIOGRAM
Anesthesia: General

## 2017-07-14 MED ORDER — GLIPIZIDE 5 MG PO TABS
5.0000 mg | ORAL_TABLET | Freq: Two times a day (BID) | ORAL | Status: DC
  Administered 2017-07-15: 5 mg via ORAL
  Filled 2017-07-14: qty 1

## 2017-07-14 MED ORDER — HYDROMORPHONE HCL 1 MG/ML IJ SOLN
INTRAMUSCULAR | Status: AC
  Filled 2017-07-14: qty 1

## 2017-07-14 MED ORDER — MIDAZOLAM HCL 2 MG/2ML IJ SOLN
INTRAMUSCULAR | Status: AC
  Filled 2017-07-14: qty 2

## 2017-07-14 MED ORDER — SUGAMMADEX SODIUM 200 MG/2ML IV SOLN
INTRAVENOUS | Status: AC
  Filled 2017-07-14: qty 2

## 2017-07-14 MED ORDER — LIDOCAINE HCL 1 % IJ SOLN
INTRAMUSCULAR | Status: DC | PRN
  Administered 2017-07-14: 30 mL

## 2017-07-14 MED ORDER — DICYCLOMINE HCL 20 MG PO TABS: 20.0000 mg | ORAL_TABLET | ORAL | Status: DC | PRN

## 2017-07-14 MED ORDER — SUCCINYLCHOLINE CHLORIDE 200 MG/10ML IV SOSY
PREFILLED_SYRINGE | INTRAVENOUS | Status: DC | PRN
  Administered 2017-07-14: 120 mg via INTRAVENOUS

## 2017-07-14 MED ORDER — DIPHENHYDRAMINE HCL 12.5 MG/5ML PO ELIX: 12.5000 mg | ORAL_SOLUTION | Freq: Four times a day (QID) | ORAL | Status: DC | PRN

## 2017-07-14 MED ORDER — LOSARTAN POTASSIUM 50 MG PO TABS
100.0000 mg | ORAL_TABLET | Freq: Every day | ORAL | Status: DC
  Administered 2017-07-15: 100 mg via ORAL
  Filled 2017-07-14: qty 2

## 2017-07-14 MED ORDER — ONDANSETRON 4 MG PO TBDP: 4.0000 mg | ORAL_TABLET | Freq: Four times a day (QID) | ORAL | Status: DC | PRN

## 2017-07-14 MED ORDER — TRAMADOL HCL 50 MG PO TABS: 50.0000 mg | ORAL_TABLET | Freq: Four times a day (QID) | ORAL | Status: DC | PRN

## 2017-07-14 MED ORDER — 0.9 % SODIUM CHLORIDE (POUR BTL) OPTIME
TOPICAL | Status: DC | PRN
  Administered 2017-07-14: 1000 mL

## 2017-07-14 MED ORDER — ONDANSETRON HCL 4 MG/2ML IJ SOLN: 4.0000 mg | Freq: Four times a day (QID) | INTRAMUSCULAR | Status: DC | PRN

## 2017-07-14 MED ORDER — MIDAZOLAM HCL 2 MG/2ML IJ SOLN
INTRAMUSCULAR | Status: DC | PRN
  Administered 2017-07-14: 2 mg via INTRAVENOUS

## 2017-07-14 MED ORDER — SODIUM CHLORIDE 0.9 % IV BOLUS (SEPSIS)
1000.0000 mL | Freq: Once | INTRAVENOUS | Status: AC
  Administered 2017-07-14: 1000 mL via INTRAVENOUS

## 2017-07-14 MED ORDER — PAROXETINE HCL 30 MG PO TABS
30.0000 mg | ORAL_TABLET | Freq: Every day | ORAL | Status: DC
  Filled 2017-07-14: qty 1

## 2017-07-14 MED ORDER — BUPIVACAINE HCL (PF) 0.25 % IJ SOLN
INTRAMUSCULAR | Status: DC | PRN
  Administered 2017-07-14: 30 mL

## 2017-07-14 MED ORDER — PANTOPRAZOLE SODIUM 40 MG PO TBEC
40.0000 mg | DELAYED_RELEASE_TABLET | Freq: Every day | ORAL | Status: DC
  Administered 2017-07-14 – 2017-07-15 (×2): 40 mg via ORAL
  Filled 2017-07-14 (×2): qty 1

## 2017-07-14 MED ORDER — ACETAMINOPHEN 325 MG PO TABS
650.0000 mg | ORAL_TABLET | Freq: Four times a day (QID) | ORAL | Status: DC
  Administered 2017-07-14 – 2017-07-15 (×3): 650 mg via ORAL
  Filled 2017-07-14 (×3): qty 2

## 2017-07-14 MED ORDER — METRONIDAZOLE IN NACL 5-0.79 MG/ML-% IV SOLN
500.0000 mg | INTRAVENOUS | Status: AC
  Administered 2017-07-14: 500 mg via INTRAVENOUS
  Filled 2017-07-14: qty 100

## 2017-07-14 MED ORDER — DIPHENHYDRAMINE HCL 50 MG/ML IJ SOLN: 12.5000 mg | Freq: Four times a day (QID) | INTRAMUSCULAR | Status: DC | PRN

## 2017-07-14 MED ORDER — BUPIVACAINE-EPINEPHRINE (PF) 0.5% -1:200000 IJ SOLN
INTRAMUSCULAR | Status: AC
  Filled 2017-07-14: qty 30

## 2017-07-14 MED ORDER — PROMETHAZINE HCL 25 MG/ML IJ SOLN: 6.2500 mg | INTRAMUSCULAR | Status: DC | PRN

## 2017-07-14 MED ORDER — DEXAMETHASONE SODIUM PHOSPHATE 10 MG/ML IJ SOLN
INTRAMUSCULAR | Status: AC
  Filled 2017-07-14: qty 1

## 2017-07-14 MED ORDER — HYDROCHLOROTHIAZIDE 12.5 MG PO CAPS
12.5000 mg | ORAL_CAPSULE | Freq: Every day | ORAL | Status: DC
  Administered 2017-07-15: 12.5 mg via ORAL
  Filled 2017-07-14: qty 1

## 2017-07-14 MED ORDER — IBUPROFEN 600 MG PO TABS: 600.0000 mg | ORAL_TABLET | Freq: Four times a day (QID) | ORAL | Status: DC | PRN

## 2017-07-14 MED ORDER — LIDOCAINE 2% (20 MG/ML) 5 ML SYRINGE
INTRAMUSCULAR | Status: DC | PRN
  Administered 2017-07-14: 80 mg via INTRAVENOUS

## 2017-07-14 MED ORDER — DEXAMETHASONE SODIUM PHOSPHATE 10 MG/ML IJ SOLN
INTRAMUSCULAR | Status: DC | PRN
  Administered 2017-07-14: 5 mg via INTRAVENOUS

## 2017-07-14 MED ORDER — HYDROMORPHONE HCL 1 MG/ML IJ SOLN
0.2500 mg | INTRAMUSCULAR | Status: DC | PRN
  Administered 2017-07-14: 0.5 mg via INTRAVENOUS

## 2017-07-14 MED ORDER — METRONIDAZOLE IN NACL 5-0.79 MG/ML-% IV SOLN
500.0000 mg | Freq: Once | INTRAVENOUS | Status: AC
  Administered 2017-07-14: 500 mg via INTRAVENOUS
  Filled 2017-07-14: qty 100

## 2017-07-14 MED ORDER — PROPOFOL 10 MG/ML IV BOLUS
INTRAVENOUS | Status: AC
  Filled 2017-07-14: qty 20

## 2017-07-14 MED ORDER — ROCURONIUM BROMIDE 100 MG/10ML IV SOLN
INTRAVENOUS | Status: DC | PRN
  Administered 2017-07-14: 50 mg via INTRAVENOUS
  Administered 2017-07-14 (×2): 10 mg via INTRAVENOUS

## 2017-07-14 MED ORDER — ONDANSETRON HCL 4 MG/2ML IJ SOLN
4.0000 mg | Freq: Once | INTRAMUSCULAR | Status: AC
  Administered 2017-07-14: 4 mg via INTRAVENOUS
  Filled 2017-07-14: qty 2

## 2017-07-14 MED ORDER — HYDROMORPHONE HCL 1 MG/ML IJ SOLN
1.0000 mg | INTRAMUSCULAR | Status: DC | PRN
  Administered 2017-07-14: 1 mg via INTRAVENOUS
  Filled 2017-07-14: qty 1

## 2017-07-14 MED ORDER — SODIUM CHLORIDE 0.9 % IV SOLN
INTRAVENOUS | Status: DC
  Administered 2017-07-14: 07:00:00 via INTRAVENOUS

## 2017-07-14 MED ORDER — ONDANSETRON HCL 4 MG/2ML IJ SOLN
INTRAMUSCULAR | Status: DC | PRN
  Administered 2017-07-14: 4 mg via INTRAVENOUS

## 2017-07-14 MED ORDER — SIMETHICONE 80 MG PO CHEW: 40.0000 mg | CHEWABLE_TABLET | Freq: Four times a day (QID) | ORAL | Status: DC | PRN

## 2017-07-14 MED ORDER — HYDROMORPHONE HCL 1 MG/ML IJ SOLN
1.0000 mg | Freq: Once | INTRAMUSCULAR | Status: AC
  Administered 2017-07-14: 1 mg via INTRAVENOUS
  Filled 2017-07-14: qty 1

## 2017-07-14 MED ORDER — HEPARIN SODIUM (PORCINE) 5000 UNIT/ML IJ SOLN
5000.0000 [IU] | Freq: Three times a day (TID) | INTRAMUSCULAR | Status: DC
  Administered 2017-07-14 – 2017-07-15 (×2): 5000 [IU] via SUBCUTANEOUS
  Filled 2017-07-14 (×2): qty 1

## 2017-07-14 MED ORDER — BUPIVACAINE HCL (PF) 0.25 % IJ SOLN
INTRAMUSCULAR | Status: AC
  Filled 2017-07-14: qty 30

## 2017-07-14 MED ORDER — ONDANSETRON HCL 4 MG/2ML IJ SOLN
INTRAMUSCULAR | Status: AC
  Filled 2017-07-14: qty 2

## 2017-07-14 MED ORDER — SODIUM CHLORIDE 0.9 % IR SOLN
Status: DC | PRN
Start: 2017-07-14 — End: 2017-07-14
  Administered 2017-07-14: 1000 mL

## 2017-07-14 MED ORDER — LIDOCAINE HCL (PF) 1 % IJ SOLN
INTRAMUSCULAR | Status: AC
  Filled 2017-07-14: qty 30

## 2017-07-14 MED ORDER — LACTATED RINGERS IV SOLN
INTRAVENOUS | Status: DC | PRN
  Administered 2017-07-14 (×2): via INTRAVENOUS

## 2017-07-14 MED ORDER — HYDROMORPHONE HCL 1 MG/ML IJ SOLN: 0.5000 mg | INTRAMUSCULAR | Status: DC | PRN

## 2017-07-14 MED ORDER — FENTANYL CITRATE (PF) 100 MCG/2ML IJ SOLN
INTRAMUSCULAR | Status: DC | PRN
  Administered 2017-07-14: 50 ug via INTRAVENOUS
  Administered 2017-07-14: 100 ug via INTRAVENOUS
  Administered 2017-07-14 (×2): 25 ug via INTRAVENOUS

## 2017-07-14 MED ORDER — PAROXETINE HCL 30 MG PO TABS
30.0000 mg | ORAL_TABLET | Freq: Every day | ORAL | Status: DC
  Administered 2017-07-14 – 2017-07-15 (×2): 30 mg via ORAL
  Filled 2017-07-14 (×2): qty 1

## 2017-07-14 MED ORDER — DOCUSATE SODIUM 100 MG PO CAPS
200.0000 mg | ORAL_CAPSULE | Freq: Two times a day (BID) | ORAL | Status: DC
  Administered 2017-07-14 – 2017-07-15 (×2): 200 mg via ORAL
  Filled 2017-07-14 (×2): qty 2

## 2017-07-14 MED ORDER — LACTATED RINGERS IV SOLN
INTRAVENOUS | Status: DC
  Administered 2017-07-14 – 2017-07-15 (×2): via INTRAVENOUS

## 2017-07-14 MED ORDER — FENTANYL CITRATE (PF) 250 MCG/5ML IJ SOLN
INTRAMUSCULAR | Status: AC
  Filled 2017-07-14: qty 5

## 2017-07-14 MED ORDER — PROPOFOL 10 MG/ML IV BOLUS
INTRAVENOUS | Status: DC | PRN
  Administered 2017-07-14: 130 mg via INTRAVENOUS

## 2017-07-14 MED ORDER — POTASSIUM CHLORIDE IN NACL 20-0.9 MEQ/L-% IV SOLN: INTRAVENOUS | Status: DC

## 2017-07-14 MED ORDER — CEFTRIAXONE SODIUM 1 G IJ SOLR
1.0000 g | Freq: Once | INTRAMUSCULAR | Status: AC
  Administered 2017-07-14: 1 g via INTRAVENOUS
  Filled 2017-07-14: qty 10

## 2017-07-14 MED ORDER — IOPAMIDOL (ISOVUE-300) INJECTION 61%
INTRAVENOUS | Status: AC
  Filled 2017-07-14: qty 50

## 2017-07-14 MED ORDER — SUGAMMADEX SODIUM 200 MG/2ML IV SOLN
INTRAVENOUS | Status: DC | PRN
  Administered 2017-07-14: 200 mg via INTRAVENOUS

## 2017-07-14 MED ORDER — LOSARTAN POTASSIUM-HCTZ 100-12.5 MG PO TABS: 1.0000 | ORAL_TABLET | Freq: Every day | ORAL | Status: DC

## 2017-07-14 SURGICAL SUPPLY — 50 items
APPLIER CLIP 5 13 M/L LIGAMAX5 (MISCELLANEOUS) ×3
BLADE CLIPPER SURG (BLADE) IMPLANT
CANISTER SUCT 3000ML PPV (MISCELLANEOUS) ×3 IMPLANT
CHLORAPREP W/TINT 26ML (MISCELLANEOUS) ×3 IMPLANT
CLIP APPLIE 5 13 M/L LIGAMAX5 (MISCELLANEOUS) ×1 IMPLANT
COVER MAYO STAND STRL (DRAPES) ×3 IMPLANT
COVER SURGICAL LIGHT HANDLE (MISCELLANEOUS) ×6 IMPLANT
DECANTER SPIKE VIAL GLASS SM (MISCELLANEOUS) ×3 IMPLANT
DERMABOND ADVANCED (GAUZE/BANDAGES/DRESSINGS) ×2
DERMABOND ADVANCED .7 DNX12 (GAUZE/BANDAGES/DRESSINGS) ×1 IMPLANT
DISSECTOR BLUNT TIP ENDO 5MM (MISCELLANEOUS) ×3 IMPLANT
DRAPE C-ARM 42X72 X-RAY (DRAPES) ×3 IMPLANT
ELECT CAUTERY BLADE 6.4 (BLADE) ×3 IMPLANT
ELECT REM PT RETURN 9FT ADLT (ELECTROSURGICAL) ×3
ELECTRODE REM PT RTRN 9FT ADLT (ELECTROSURGICAL) ×1 IMPLANT
GLOVE BIO SURGEON STRL SZ 6.5 (GLOVE) ×2 IMPLANT
GLOVE BIO SURGEON STRL SZ7 (GLOVE) ×3 IMPLANT
GLOVE BIO SURGEON STRL SZ7.5 (GLOVE) ×3 IMPLANT
GLOVE BIO SURGEONS STRL SZ 6.5 (GLOVE) ×1
GLOVE BIOGEL M 6.5 STRL (GLOVE) ×3 IMPLANT
GLOVE BIOGEL PI IND STRL 6.5 (GLOVE) ×1 IMPLANT
GLOVE BIOGEL PI IND STRL 7.0 (GLOVE) ×2 IMPLANT
GLOVE BIOGEL PI INDICATOR 6.5 (GLOVE) ×2
GLOVE BIOGEL PI INDICATOR 7.0 (GLOVE) ×4
GLOVE INDICATOR 8.0 STRL GRN (GLOVE) ×3 IMPLANT
GOWN STRL REUS W/ TWL LRG LVL3 (GOWN DISPOSABLE) ×6 IMPLANT
GOWN STRL REUS W/ TWL XL LVL3 (GOWN DISPOSABLE) ×1 IMPLANT
GOWN STRL REUS W/TWL LRG LVL3 (GOWN DISPOSABLE) ×12
GOWN STRL REUS W/TWL XL LVL3 (GOWN DISPOSABLE) ×2
KIT BASIN OR (CUSTOM PROCEDURE TRAY) ×3 IMPLANT
KIT ROOM TURNOVER OR (KITS) ×3 IMPLANT
NEEDLE INSUFFLATION 14GA 120MM (NEEDLE) ×3 IMPLANT
NS IRRIG 1000ML POUR BTL (IV SOLUTION) ×3 IMPLANT
PAD ARMBOARD 7.5X6 YLW CONV (MISCELLANEOUS) ×3 IMPLANT
PENCIL BUTTON HOLSTER BLD 10FT (ELECTRODE) ×3 IMPLANT
POUCH SPECIMEN RETRIEVAL 10MM (ENDOMECHANICALS) ×3 IMPLANT
SCISSORS LAP 5X35 DISP (ENDOMECHANICALS) ×3 IMPLANT
SET CHOLANGIOGRAPH 5 50 .035 (SET/KITS/TRAYS/PACK) ×3 IMPLANT
SET IRRIG TUBING LAPAROSCOPIC (IRRIGATION / IRRIGATOR) ×3 IMPLANT
SLEEVE ENDOPATH XCEL 5M (ENDOMECHANICALS) ×9 IMPLANT
SPECIMEN JAR SMALL (MISCELLANEOUS) IMPLANT
SUT MNCRL AB 4-0 PS2 18 (SUTURE) ×6 IMPLANT
TOWEL OR 17X24 6PK STRL BLUE (TOWEL DISPOSABLE) IMPLANT
TOWEL OR 17X26 10 PK STRL BLUE (TOWEL DISPOSABLE) ×3 IMPLANT
TRAY LAPAROSCOPIC MC (CUSTOM PROCEDURE TRAY) ×3 IMPLANT
TROCAR BLADELESS 12MM (ENDOMECHANICALS) ×3 IMPLANT
TROCAR XCEL BLUNT TIP 100MML (ENDOMECHANICALS) ×3 IMPLANT
TROCAR XCEL NON-BLD 5MMX100MML (ENDOMECHANICALS) ×3 IMPLANT
TUBING INSUFFLATION (TUBING) ×3 IMPLANT
WATER STERILE IRR 1000ML POUR (IV SOLUTION) ×3 IMPLANT

## 2017-07-14 NOTE — Discharge Instructions (Signed)

## 2017-07-14 NOTE — Anesthesia Procedure Notes (Addendum)
Procedure Name: Intubation Date/Time: 07/14/2017 1:56 PM Performed by: Leonor Liv, CRNA Pre-anesthesia Checklist: Patient identified, Emergency Drugs available, Suction available and Patient being monitored Patient Re-evaluated:Patient Re-evaluated prior to induction Oxygen Delivery Method: Circle System Utilized Preoxygenation: Pre-oxygenation with 100% oxygen Induction Type: IV induction, Cricoid Pressure applied and Rapid sequence Ventilation: Mask ventilation without difficulty Laryngoscope Size: Mac and 3 Grade View: Grade II Tube type: Oral Tube size: 7.0 mm Number of attempts: 1 Airway Equipment and Method: Stylet and Oral airway Placement Confirmation: ETT inserted through vocal cords under direct vision,  positive ETCO2 and breath sounds checked- equal and bilateral Secured at: 21 cm Tube secured with: Tape Dental Injury: Teeth and Oropharynx as per pre-operative assessment

## 2017-07-14 NOTE — H&P (Signed)
Jill Garza is an 67 y.o. female.   Chief Complaint: RUQ pain PMP:  Gildardo Cranker, DO  HPI: Pt presents complaining of pain for 1 week RUQ going to the right side, worse with palpation.  Nausea and vomiting no fever, change in bowel habits.  Seen in [place] 3/2 with [place], that time which showed echogenic gallbladder sludge, no gallstones, no gallbladder wall thickening or pericholecystic fluid.  Common bile duct was 6 mm in diameter.  Liver showed hepatic steatosis and mild hepatomegaly. Meds including Bentyl/Zofran, and tramadol has not relieved her discomfort for the most part, she continued to have some pain with motion, but was able to eat without any issue.  Pain got really bad again last PM around 9:30 PM. It persist but better with Dilaudid currently.   Work up in [facility] ED:  Afebrile, BP 161/108.  Creatinine is baseline at 1.30, LFT's are normal WBC is normal at 7.9.  UA [place] negative.  CT scan:Mild enlargement of the gallbladder with induration/haziness of the gallbladder wall. Finding is of uncertain significance, but could reflect sequelae of mild/early acute cholecystitis. 2.8 x 2.6 x 2.4 cm perirectal collection as above. Clinical correlation for possible perirectal abscess recommended  Mildly enlarged porta hepatis adenopathy, likely reactive.  Colonic diverticulosis without evidence for acute diverticulitis.   She was transferred to [facility] ED for further evaluation.     Past Medical History:  Diagnosis Date  . Arthritis   . Cancer ([place])    melanoma- right leg  . Depression   . GERD (gastroesophageal reflux disease)   . History of hiatal hernia   . Hypercholesterolemia   . Hypertension   . Sweat, sweating, excessive   . Type 2 diabetes mellitus ([place])     Past Surgical History:  Procedure Laterality Date  . ABDOMINAL HYSTERECTOMY  1979  . APPENDECTOMY  1964  . BREAST LUMPECTOMY WITH RADIOACTIVE SEED LOCALIZATION Left 12/31/2014   Procedure: LEFT BREAST LUMPECTOMY WITH RADIOACTIVE  SEED LOCALIZATION;  Surgeon: Donnie Mesa, MD;  Location: [place];  Service: General;  Laterality: Left;  . CARPAL TUNNEL RELEASE Right 1977  . CARPAL TUNNEL RELEASE Left 2007  . CATARACT EXTRACTION Left 04/13/2014   Dr. Herbert Deaner  . CATARACT EXTRACTION Right 06/10/2014   Dr. Herbert Deaner  . COLONOSCOPY  2009  . JOINT REPLACEMENT    . MELANOMA EXCISION    . TONSILLECTOMY  1962  . TOTAL KNEE ARTHROPLASTY Left 2012  . TOTAL KNEE ARTHROPLASTY Right 2008    Family History  Problem Relation Age of Onset  . Heart attack Father   . Pulmonary embolism Mother        Multiple, B/L   . Hypertension Brother   . Heart disease Brother   . Diabetes Brother   . High Cholesterol Brother   . Hypertension Brother   . High Cholesterol Brother   . Hypertension Sister   . Diabetes type II Sister   . High Cholesterol Sister   . Alcohol abuse Brother   . Reye's syndrome Daughter   . Hepatitis Brother   . Stroke Unknown   . Cancer Maternal Grandmother 99  . Colon cancer Neg Hx   . Esophageal cancer Neg Hx   . Stomach cancer Neg Hx   . Rectal cancer Neg Hx    Social History:  reports that she quit smoking about 31 years ago. She quit after 15.00 years of use. she has never used smokeless tobacco. She reports that she does not  drink alcohol or use drugs.  Allergies:  Allergies  Allergen Reactions  . Penicillins     Rash   . Ivp Dye [Iodinated Diagnostic Agents]     Hives   . Percocet [Oxycodone-Acetaminophen]     Itching     Prior to Admission medications   Medication Sig Start Date End Date Taking? Authorizing Provider  cefUROXime (CEFTIN) 250 MG tablet Take 1 tablet (250 mg total) by mouth 2 (two) times daily with a meal. 04/18/17   Gildardo Cranker, DO  clindamycin (CLEOCIN) 300 MG capsule Take 2 capsules prior to dental work 10/21/16   Gildardo Cranker, DO  fenofibrate (TRICOR) 145 MG tablet TAKE 1 TABLET BY MOUTH ONCE DAILY 05/03/17   Gildardo Cranker, DO  glipiZIDE (GLUCOTROL) 5  MG tablet Take 1 tablet (5 mg total) 2 (two) times daily before a meal by mouth. 03/15/17   Gildardo Cranker, DO  losartan-hydrochlorothiazide [facility]) 100-12.5 MG tablet TAKE 1 TABLET BY MOUTH ONCE DAILY 06/05/17   Lauree Chandler, NP  omeprazole (PRILOSEC) 20 MG capsule TAKE 1 CAPSULE BY MOUTH ONCE DAILY 01/31/17   Gildardo Cranker, DO  PARoxetine (PAXIL) 30 MG tablet TAKE 1 TABLET BY MOUTH ONCE DAILY 05/03/17   Gildardo Cranker, DO     Results for orders placed or performed during the hospital encounter of 07/14/17 (from the past 48 hour(s))  CBC with Differential     Status: None   Collection Time: 07/14/17  5:18 AM  Result Value Ref Range   WBC 7.9 4.0 - 10.5 K/uL   RBC 4.76 3.87 - 5.11 MIL/uL   Hemoglobin 13.7 12.0 - 15.0 g/dL   HCT 40.9 36.0 - 46.0 %   MCV 85.9 78.0 - 100.0 fL   MCH 28.8 26.0 - 34.0 pg   MCHC 33.5 30.0 - 36.0 g/dL   RDW 12.2 11.5 - 15.5 %   Platelets 303 150 - 400 K/uL   Neutrophils Relative % 78 %   Neutro Abs 6.2 1.7 - 7.7 K/uL   Lymphocytes Relative 19 %   Lymphs Abs 1.5 0.7 - 4.0 K/uL   Monocytes Relative 3 %   Monocytes Absolute 0.2 0.1 - 1.0 K/uL   Eosinophils Relative 0 %   Eosinophils Absolute 0.0 0.0 - 0.7 K/uL   Basophils Relative 0 %   Basophils Absolute 0.0 0.0 - 0.1 K/uL    Comment: Performed at [facility], [address] [place], [place] [address]  Comprehensive metabolic panel     Status: Abnormal   Collection Time: 07/14/17  5:18 AM  Result Value Ref Range   Sodium 134 (L) 135 - 145 mmol/L   Potassium 3.9 3.5 - 5.1 mmol/L   Chloride 102 101 - 111 mmol/L   CO2 21 (L) 22 - 32 mmol/L   Glucose, Bld 147 (H) 65 - 99 mg/dL   BUN 24 (H) 6 - 20 mg/dL   Creatinine, Ser 1.30 (H) 0.44 - 1.00 mg/dL   Calcium 10.4 (H) 8.9 - 10.3 mg/dL   Total Protein 7.9 6.5 - 8.1 g/dL   Albumin 4.4 3.5 - 5.0 g/dL   AST 22 15 - 41 U/L   ALT 20 14 - 54 U/L   Alkaline Phosphatase 46 38 - 126 U/L   Total Bilirubin 0.3 0.3 - 1.2 mg/dL   GFR calc non Af  Amer 41 (L) >60 mL/min   GFR calc Af Amer 48 (L) >60 mL/min    Comment: (NOTE) The eGFR has been  calculated using the CKD EPI equation. This calculation has not been validated in all clinical situations. eGFR's persistently <60 mL/min signify possible Chronic Kidney Disease.    Anion gap 11 5 - 15    Comment: Performed at [facility], [address] [place], [place] [address]  Lipase, blood     Status: None   Collection Time: 07/14/17  5:18 AM  Result Value Ref Range   Lipase 28 11 - 51 U/L    Comment: Performed at [facility], [address] [place], [place] [address]  Urinalysis, Routine w reflex microscopic     Status: Abnormal   Collection Time: 07/14/17  7:33 AM  Result Value Ref Range   Color, Urine YELLOW YELLOW   APPearance CLEAR CLEAR   Specific Gravity, Urine >1.030 (H) 1.005 - 1.030   pH 5.5 5.0 - 8.0   Glucose, UA NEGATIVE NEGATIVE mg/dL   Hgb urine dipstick NEGATIVE NEGATIVE   Bilirubin Urine NEGATIVE NEGATIVE   Ketones, ur NEGATIVE NEGATIVE mg/dL   Protein, ur NEGATIVE NEGATIVE mg/dL   Nitrite NEGATIVE NEGATIVE   Leukocytes, UA NEGATIVE NEGATIVE    Comment: Microscopic not done on urines with negative protein, blood, leukocytes, nitrite, or glucose < 500 mg/dL. Performed at [facility], [address], [place], [place] [address]    Ct Abdomen Pelvis Wo Contrast  Result Date: 07/14/2017 CLINICAL DATA:  Initial evaluation for acute right upper quadrant pain. EXAM: CT ABDOMEN AND PELVIS WITHOUT CONTRAST TECHNIQUE: Multidetector CT imaging of the abdomen and pelvis was performed following the standard protocol without IV contrast. COMPARISON:  Prior ultrasound from 12/25/2015. FINDINGS: Lower chest: Visualized lungs are clear. Coronary artery calcifications noted. Hepatobiliary: Liver within normal limits. Gallbladder somewhat enlarged with subtle ill-defined induration/haziness of the gallbladder wall, suggesting inflammation. Finding  could reflect sequelae of acute cholecystitis. No appreciable cholelithiasis. No biliary dilatation. Pancreas: Pancreas within normal limits. Spleen: Punctate granuloma noted within the spleen. Spleen otherwise unremarkable. Adrenals/Urinary Tract: Adrenal glands are normal. Kidneys equal in size without nephrolithiasis or hydronephrosis. No hydroureter. Bladder largely decompressed without acute abnormality. Stomach/Bowel: Stomach within normal limits. No evidence for bowel obstruction. Sigmoid diverticulosis without evidence for acute diverticulitis. Appendix not visualize, consistent with history prior appendectomy. There is an apparent hypodense collection measuring approximately 2.8 x 2.6 x 2.4 cm at the posterior aspect of the rectum (series 2, image 102). Possible communication with the overlying skin (series 2, image 104). Query abscess or possibly sinus tract. No significant inflammation within the adjacent peritoneal fat. Vascular/Lymphatic: Intra-abdominal aorta of normal caliber. Mild atherosclerosis. Few scattered prominent porta hepatis lymph nodes measure up to 11 mm, which may be reactive. No other adenopathy within the abdomen and pelvis. Reproductive: Uterus is absent.  Ovaries within normal limits. Other: No free air or fluid. Musculoskeletal: Surgical clips noted within the left breast. No acute osseous abnormality. No worrisome lytic or blastic osseous lesions. Prominent central disc osteophyte complex noted at T10-11. Secondary mild spinal stenosis. IMPRESSION: 1. Mild enlargement of the gallbladder with induration/haziness of the gallbladder wall. Finding is of uncertain significance, but could reflect sequelae of mild/early acute cholecystitis. Correlation with symptomatology and LFTs recommended. Additionally, further evaluation with dedicated right upper quadrant ultrasound may be helpful as clinically warranted. 2. 2.8 x 2.6 x 2.4 cm perirectal collection as above. Clinical correlation for  possible perirectal abscess recommended. 3. Mildly enlarged porta hepatis adenopathy, likely reactive. 4. Colonic diverticulosis without evidence for acute diverticulitis.  Electronically Signed   By: Jeannine Boga M.D.   On: 07/14/2017 06:10    Review of Systems  Constitutional: Negative.   HENT: Positive for hearing loss.   Eyes: Negative.   Respiratory: Negative.   Cardiovascular: Negative.   Gastrointestinal: Positive for abdominal pain, heartburn, nausea and vomiting. Negative for blood in stool, constipation, diarrhea and melena.  Genitourinary: Negative.   Musculoskeletal: Negative.   Skin: Negative.   Neurological: Positive for dizziness (orthostatic).  Endo/Heme/Allergies: Negative.   Psychiatric/Behavioral: Positive for depression. The patient is nervous/anxious.     Blood pressure 98/73, pulse 76, temperature 98.4 F (36.9 C), temperature source Oral, resp. rate 18, height '5\' 4"'$  (1.626 m), weight 90.7 kg (200 lb), SpO2 97 %. Physical Exam  Constitutional: She is oriented to person, place, and time. She appears well-developed and well-nourished. No distress.  HENT:  Head: Normocephalic and atraumatic.  Mouth/Throat: Oropharynx is clear and moist. No oropharyngeal exudate.  Eyes: Right eye exhibits no discharge. Left eye exhibits no discharge.  Pupils are equal  Neck: Normal range of motion. Neck supple. No JVD present. No tracheal deviation present. No thyromegaly present.  Cardiovascular: Normal rate, regular rhythm, normal heart sounds and intact distal pulses.  No murmur heard. Respiratory: Effort normal and breath sounds normal. No respiratory distress. She has no wheezes. She has no rales. She exhibits no tenderness.  GI: Soft. Bowel sounds are normal. She exhibits no distension and no mass. There is tenderness (RUQ). There is no rebound and no guarding.  Genitourinary:  Genitourinary Comments: I did not do a rectal exam, but palpation of the rectum around the  anus is completely asymptomatic.    Musculoskeletal: She exhibits no edema or tenderness.  Lymphadenopathy:    She has no cervical adenopathy.  Neurological: She is alert and oriented to person, place, and time. No cranial nerve deficit.  Skin: Skin is warm and dry. No rash noted. She is not diaphoretic. No erythema. No pallor.  Psychiatric: She has a normal mood and affect. Her behavior is normal. Judgment and thought content normal.     Assessment/Plan RUQ pain for over 1 week 2.8 x 2.6 x 2.4 cm perirectal collection - asymptomatic Hx of left breast cancer and right leg melanoma GERD Type II diabetes Hypertension Hx of tobacco use - quit 32 years ago  Plan:  Admit to observation, repeat ultrasound, and keep NPO for now.  Fluid hydration.        Bralen Wiltgen, PA-C 07/14/2017, 9:24 AM

## 2017-07-14 NOTE — Transfer of Care (Signed)
Immediate Anesthesia Transfer of Care Note  Patient: Jill Garza [facility]  Procedure(s) Performed: LAPAROSCOPIC CHOLECYSTECTOMY (N/A )  Patient Location: PACU  Anesthesia Type:General  Level of Consciousness: drowsy  Airway & Oxygen Therapy: Patient Spontanous Breathing and Patient connected to face mask oxygen  Post-op Assessment: Report given to RN, Post -op Vital signs reviewed and stable and Patient moving all extremities  Post vital signs: Reviewed and stable  Last Vitals:  Vitals:   07/14/17 1224 07/14/17 1543  BP:    Pulse: 75   Resp:    Temp:  (P) 36.5 C  SpO2: 95%     Last Pain:  Vitals:   07/14/17 1223  TempSrc:   PainSc: 7          Complications: No apparent anesthesia complications

## 2017-07-14 NOTE — ED Notes (Signed)
Pt in bed resting. Family at bedside.

## 2017-07-14 NOTE — Op Note (Signed)
07/14/2017 3:23 PM  PATIENT: Jill Garza  67 y.o. female  Patient Care Team: Gildardo Cranker, DO as PCP - General (Internal Medicine)  PRE-OPERATIVE DIAGNOSIS: cholecystitis  POST-OPERATIVE DIAGNOSIS: cholecystitis  PROCEDURE: Laparoscopic cholecystectomy  SURGEON: Sharon Mt. Dema Severin, MD  ASSISTANT: Saverio Danker, PA-C   ANESTHESIA: General endotracheal  EBL: Total I/O In: 100 [IV Piggyback:100] Out: -   DRAINS: None  SPECIMEN: Gallbladder  COMPLICATIONS: None  COUNTS: Sponge, needle and instrument counts were reported correct x2 at the conclusion of the operation  DISPOSITION: PACU in satisfactory condition  FINDINGS: Omentum stuck covering gallbladder. Acute cholecystitis with very thickened, edematous, friable gallbladder wall. Critical view of safety obtained prior to ligation or division of any structures. Posterior cystic artery identified mid-way up in the gallbladder bed which was additionally controlled with 2 clips.  INDICATION:  Jill Garza is a 53yoF with Dm, HTN, HLD with 1wk hx of RUQ pain, nausea vomiting; no fever/chills. Pain is sharp and radiates to her right flank. Never had this prior. She was seen in [place] last week and told she had sludge in her gallbladder and was discharged. Pain persisted and was seen at [facility] last night.  On exam she was tender in her right upper quadrant with a positive Murphy's sign. She had a WBC of 7.9. She underwent CT and [place] RUQ. US demonstrated multiple stones in the gallbladder with upper limits normal wall thickening and pericholecystic fluid. Changes consistent with acute cholecystitis.  Common bile duct measured 3.6 mm.  She was diagnosed with acute cholecystitis.  The anatomy & physiology of hepatobiliary & pancreatic function was discussed.  The pathophysiology of gallbladder dysfunction was discussed.  Natural history risks without surgery was discussed.   I feel the risks of no intervention will lead to serious  problems that outweigh the operative risks; therefore, I recommended cholecystectomy to remove the pathology.  I explained laparoscopic techniques with possible need for an open approach.  Possible cholangiogram to evaluate the bilary tract was explained as well.    The planned procedure, material risks (including but not limited to pain, bleeding, infection, abscess, bile leak, injury to other organs/viscus/common bile duct, need for additional procedures, aspiration, pneumonia, heart attack, death) benefits and alternative were discussed. She expressed understanding. Goals of post-operative recovery were discussed as well.  Her questions were answered to her satisfaction and she elected to proceed with surgery.   DESCRIPTION:  The patient was identified & brought into the operating room. The patient was positioned supine on the OR table. SCDs were in place and active during the entire case. The patient underwent general endotracheal anesthesia. The abdomen was prepped and draped in the standard sterile fashion and antibiotics were administered. A surgical timeout was performed and confirmed our plan.   An incision was made at Palmer's point and a Varess needle introduced into the peritoneal cavity. A drop test was performed and peritoneal location confirmed.  The abdomen was then insufflated with CO2 to a maximum pressure of 15 mmHg.  An Optiview trocar was then introduced into the left upper quadrant.  The laparoscope was then inserted back into the perineal cavity and inspection of the area revealed no evidence of trocar site complications.  A 12 mm port was then placed under direct visualization into the midepigastric region.  3 additional 5 Miller ports were placed.  One in the supraumbilical position, one in the midclavicular line, and one in the right lateral abdomen in the subcostal location.  The liver and  gallbladder were inspected.  Omentum was found to be wrapped around the gallbladder.   Omental adhesions were then taken down sharply.  Care was taken to protect the colon and duodenum from injury. The gallbladder fundus was grasped and elevated cephalad. An additional grasper was then placed on the infundibulum of the gallbladder and the infundibulum was retracted laterally. Gentle blunt dissection was then employed with a IT consultant working down into [employer]. The peritoneum on both sides of the gallbladder was opened with hook cautery. The cystic duct was identified, carefully circumferentially dissected. The cystic artery was then carefully circumferentially dissected. The space between the cystic artery and hepatocystic plate was developed such that the liver could be seen through a window medial to the cystic artery. The triangle of Calot was cleared of all fibrofatty tissue. At this point, a critical view of safety was achieved and the only structures visualized was the skeletonized cystic duct laterally, the skeletonized cystic artery and the liver through the window medial to the artery.   The cystic duct and artery were clipped with 2 clips on the "stay" side and 1 clip on the specimen side. The cystic duct and artery were then divided. The gallbladder was then freed from its remaining attachments to the liver using electrocautery.  A small posterior cystic artery was identified midway up the gallbladder in the hepatocystic plate and was controlled with clips.  The gallbladder was then placed into an endocatch bag. Hemostasis was achieved and then re-verified. The rest of the abdomen was inspected no injury nor bleeding elsewhere was identified. The RUQ was irrigated with normal saline. The clips and gallbladder fossa were reinspected and noted to be in place and hemostatic.  The gallbladder and endocatch bag was then removed from the midepigastric port site and passed off as specimen. The RUQ ports were removed under direct visualization and noted to be hemostatic.  The  midepigastric port site was quite tangential in the anterior and posterior rectus sheaths did not overlap one another.  Given this the fascia was not closed at this location.  The skin of all incision sites was approximated with 4-0 monocryl subcuticular suture and dermabond applied. The patient was then extubated and transferred to a stretcher for transport to PACU in satisfactory condition.

## 2017-07-14 NOTE — ED Provider Notes (Signed)
Pt arrives from [facility] with concerned for possible cholecystitis, to see general surgery. Pain is currently controlled. Gen surgery notified of pts arrival - they will see in ED.    Lajean Saver, MD 07/14/17 0930

## 2017-07-14 NOTE — ED Notes (Signed)
Dr. Dema Severin paged to [phone] Dr. Cyndy Freeze by Levada Dy

## 2017-07-14 NOTE — ED Triage Notes (Signed)
Pt c/o RUQ abd pain. Pt states last week she was out of town and seen at ED for same dx with biliary colic. Pt states pain returned 9pm last night.

## 2017-07-14 NOTE — ED Notes (Signed)
Patient transported to Ultrasound 

## 2017-07-14 NOTE — Anesthesia Preprocedure Evaluation (Signed)
Anesthesia Evaluation  Patient identified by MRN, date of birth, ID band Patient awake    Reviewed: Allergy & Precautions, NPO status , Patient's Chart, lab work & pertinent test results  History of Anesthesia Complications Negative for: history of anesthetic complications  Airway Mallampati: II  TM Distance: >3 FB Neck ROM: Full    Dental  (+) Caps, Partial Upper, Partial Lower,    Pulmonary former smoker,    breath sounds clear to auscultation       Cardiovascular hypertension, Pt. on medications  Rhythm:Regular Rate:Normal     Neuro/Psych PSYCHIATRIC DISORDERS Depression    GI/Hepatic hiatal hernia, GERD  Medicated,  Endo/Other  diabetes, Type 2, Oral Hypoglycemic AgentsMorbid obesity  Renal/GU      Musculoskeletal   Abdominal   Peds  Hematology   Anesthesia Other Findings   Reproductive/Obstetrics                             Anesthesia Physical  Anesthesia Plan  ASA: III  Anesthesia Plan: General   Post-op Pain Management:    Induction: Intravenous  PONV Risk Score and Plan: 4 or greater and Ondansetron, Dexamethasone, Diphenhydramine and Treatment may vary due to age or medical condition  Airway Management Planned: Oral ETT  Additional Equipment:   Intra-op Plan:   Post-operative Plan: Extubation in OR  Informed Consent: I have reviewed the patients History and Physical, chart, labs and discussed the procedure including the risks, benefits and alternatives for the proposed anesthesia with the patient or authorized representative who has indicated his/her understanding and acceptance.   Dental advisory given  Plan Discussed with: CRNA and Anesthesiologist  Anesthesia Plan Comments:         Anesthesia Quick Evaluation

## 2017-07-14 NOTE — ED Provider Notes (Signed)
TIME SEEN: 5:15 AM  CHIEF COMPLAINT: Abdominal pain  HPI: Patient is a 67 year old female with history of hypertension, hyperlipidemia, diabetes who presents to the emergency department with complaints of right upper quadrant pain.  Has been having pain for over a week in this area.  It is sharp and severe.  Radiates into the right flank.  Worse with palpation of this area, lying on the right side.  Has had nausea and vomiting but no fevers, chills, diarrhea, bloody stool, melena, dysuria or hematuria.  Has had previous appendectomy and bladder tuck.  States that she was seen at an emergency department in [facility] on March 2.  Had a right upper quadrant ultrasound at that time which showed echogenic gallbladder sludge, no gallstones, no gallbladder wall thickening or pericholecystic fluid.  Common bile duct was 6 mm in diameter.  Liver showed hepatic steatosis and mild hepatomegaly.  Labs revealed creatinine of 1.47 but otherwise unremarkable.  No leukocytosis.  Normal LFTs and lipase.  States she was discharged with Zofran, Bentyl, tramadol.  Tried all these medications tonight but pain was still severe and she vomited.  ROS: See HPI Constitutional: no fever  Eyes: no drainage  ENT: no runny nose   Cardiovascular:  no chest pain  Resp: no SOB  GI:  vomiting GU: no dysuria Integumentary: no rash  Allergy: no hives  Musculoskeletal: no leg swelling  Neurological: no slurred speech ROS otherwise negative  PAST MEDICAL HISTORY/PAST SURGICAL HISTORY:  Past Medical History:  Diagnosis Date  . Arthritis   . Cancer ([place])    melanoma- right leg  . Depression   . GERD (gastroesophageal reflux disease)   . History of hiatal hernia   . Hypercholesterolemia   . Hypertension   . Sweat, sweating, excessive   . Type 2 diabetes mellitus (HCC)     MEDICATIONS:  Prior to Admission medications   Medication Sig Start Date End Date Taking? Authorizing Provider  cefUROXime (CEFTIN) 250 MG tablet  Take 1 tablet (250 mg total) by mouth 2 (two) times daily with a meal. 04/18/17   Gildardo Cranker, DO  clindamycin (CLEOCIN) 300 MG capsule Take 2 capsules prior to dental work 10/21/16   Gildardo Cranker, DO  fenofibrate (TRICOR) 145 MG tablet TAKE 1 TABLET BY MOUTH ONCE DAILY 05/03/17   Gildardo Cranker, DO  glipiZIDE (GLUCOTROL) 5 MG tablet Take 1 tablet (5 mg total) 2 (two) times daily before a meal by mouth. 03/15/17   Gildardo Cranker, DO  losartan-hydrochlorothiazide [facility]) 100-12.5 MG tablet TAKE 1 TABLET BY MOUTH ONCE DAILY 06/05/17   Lauree Chandler, NP  omeprazole (PRILOSEC) 20 MG capsule TAKE 1 CAPSULE BY MOUTH ONCE DAILY 01/31/17   Gildardo Cranker, DO  PARoxetine (PAXIL) 30 MG tablet TAKE 1 TABLET BY MOUTH ONCE DAILY 05/03/17   Gildardo Cranker, DO    ALLERGIES:  Allergies  Allergen Reactions  . Penicillins     Rash   . Ivp Dye [Iodinated Diagnostic Agents]     Hives   . Percocet [Oxycodone-Acetaminophen]     Itching     SOCIAL HISTORY:  Social History   Tobacco Use  . Smoking status: Former Smoker    Years: 15.00    Last attempt to quit: 05/09/1986    Years since quitting: 31.2  . Smokeless tobacco: Never Used  Substance Use Topics  . Alcohol use: No    Alcohol/week: 0.0 oz    Comment: yearly    FAMILY HISTORY: Family History  Problem Relation  Age of Onset  . Heart attack Father   . Pulmonary embolism Mother        Multiple, B/L   . Hypertension Brother   . Heart disease Brother   . Diabetes Brother   . High Cholesterol Brother   . Hypertension Brother   . High Cholesterol Brother   . Hypertension Sister   . Diabetes type II Sister   . High Cholesterol Sister   . Alcohol abuse Brother   . Reye's syndrome Daughter   . Hepatitis Brother   . Stroke Unknown   . Cancer Maternal Grandmother 71  . Colon cancer Neg Hx   . Esophageal cancer Neg Hx   . Stomach cancer Neg Hx   . Rectal cancer Neg Hx     EXAM: BP (!) 161/108 (BP Location: Right Arm)   Pulse 72    Temp 98.2 F (36.8 C) (Oral)   Resp 18   Ht 5\' 4"  (1.626 m)   Wt 90.7 kg (200 lb)   SpO2 98%   BMI 34.33 kg/m  CONSTITUTIONAL: Alert and oriented and responds appropriately to questions. Well-appearing; well-nourished HEAD: Normocephalic EYES: Conjunctivae clear, pupils appear equal, EOMI ENT: normal nose; moist mucous membranes NECK: Supple, no meningismus, no nuchal rigidity, no LAD  CARD: RRR; S1 and S2 appreciated; no murmurs, no clicks, no rubs, no gallops RESP: Normal chest excursion without splinting or tachypnea; breath sounds clear and equal bilaterally; no wheezes, no rhonchi, no rales, no hypoxia or respiratory distress, speaking full sentences ABD/GI: Normal bowel sounds; non-distended; soft, tender to palpation in the right upper quadrant with positive Murphy sign, no rebound, no guarding, no peritoneal signs, no hepatosplenomegaly BACK:  The back appears normal and is non-tender to palpation, there is no CVA tenderness EXT: Normal ROM in all joints; non-tender to palpation; no edema; normal capillary refill; no cyanosis, no calf tenderness or swelling    SKIN: Normal color for age and race; warm; no rash NEURO: Moves all extremities equally PSYCH: The patient's mood and manner are appropriate. Grooming and personal hygiene are appropriate.  MEDICAL DECISION MAKING: Patient here with right upper quadrant pain.  I am concerned for possible cholecystitis.  Pancreatitis, colitis, bowel obstruction also in the differential.  She had right upper quadrant ultrasound 6 days ago at an outside emergency department which showed gallbladder sludge but no other abnormality.  Will repeat labs, urine today.  Will obtain CT of her abdomen pelvis for further evaluation.  Will give IV fluids, pain and nausea medicine.  ED PROGRESS: Patient's labs unremarkable other than elevated creatinine which appears to be her baseline.  No leukocytosis.  Normal LFTs and lipase.  CT scan shows enlargement of  the gallbladder with induration and haziness of the gallbladder wall that could reflect early acute cholecystitis.  Clinically I feel she does have cholecystitis given she has a positive Murphy sign, worsening pain, vomiting.  She also has a perirectal fluid collection noted.  She denies any rectal pain.  Declines rectal exam at this time.  Will discuss with general surgery at [facility].  Patient would prefer for transfer to [facility] over [facility].  Will give IV Rocephin and Flagyl.   6:25 AM  D/w Dr. Redmond Pulling with general surgery.  He agrees with transferring patient ED to ED.  He states that the surgeon will see her when she arrives to decide if she needs an ultrasound.  Discussed with Dr. Stark Jock in the [facility] emergency department who agrees  to accept the patient in transfer ED to ED.  We will continue IV fluids and keep her n.p.o.  Standing orders for Dilaudid and Zofran have been ordered.   I reviewed all nursing notes, vitals, pertinent previous records, EKGs, lab and urine results, imaging (as available).     Annell Canty, Delice Bison, DO 07/14/17 [phone]

## 2017-07-15 DIAGNOSIS — K8000 Calculus of gallbladder with acute cholecystitis without obstruction: Secondary | ICD-10-CM | POA: Diagnosis not present

## 2017-07-15 LAB — BASIC METABOLIC PANEL
ANION GAP: 10 (ref 5–15)
BUN: 13 mg/dL (ref 6–20)
CALCIUM: 9.5 mg/dL (ref 8.9–10.3)
CHLORIDE: 104 mmol/L (ref 101–111)
CO2: 25 mmol/L (ref 22–32)
Creatinine, Ser: 1.05 mg/dL — ABNORMAL HIGH (ref 0.44–1.00)
GFR calc non Af Amer: 53 mL/min — ABNORMAL LOW (ref >60)
Glucose, Bld: 152 mg/dL — ABNORMAL HIGH (ref 65–99)
POTASSIUM: 3.9 mmol/L (ref 3.5–5.1)
Sodium: 139 mmol/L (ref 135–145)

## 2017-07-15 LAB — CBC
HCT: 34 % — ABNORMAL LOW (ref 36.0–46.0)
HEMOGLOBIN: 10.7 g/dL — AB (ref 12.0–15.0)
MCH: 28.2 pg (ref 26.0–34.0)
MCHC: 31.5 g/dL (ref 30.0–36.0)
MCV: 89.5 fL (ref 78.0–100.0)
Platelets: 232 10*3/uL (ref 150–400)
RBC: 3.8 MIL/uL — AB (ref 3.87–5.11)
RDW: 12.6 % (ref 11.5–15.5)
WBC: 5.4 10*3/uL (ref 4.0–10.5)

## 2017-07-15 LAB — GLUCOSE, CAPILLARY: Glucose-Capillary: 107 mg/dL — ABNORMAL HIGH (ref 65–99)

## 2017-07-15 LAB — MAGNESIUM: Magnesium: 2 mg/dL (ref 1.7–2.4)

## 2017-07-15 LAB — PHOSPHORUS: Phosphorus: 3.1 mg/dL (ref 2.5–4.6)

## 2017-07-15 NOTE — Discharge Summary (Signed)
Patient ID: Jill Garza [id number] 66 y.o. 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  Presents with one-week history of right upper quadrant pain with nausea and vomiting.  Ultrasound on 3/2 shows gallbladder sludge but no dramatic inflammatory changes.pain has persisted.  Worsened.  Came to the ED where LFTs were normal.  WBC 7900.  CT scan showed mild distention of gallbladder with some haziness of the gallbladder wall.repeat ultrasound shows distended gallbladder with upper limits of normal wall thickening and pericholecystic fluid and multiple gallstones.  She was admitted by Dr. Dema Severin who felt she had acute cholecystitis and she was  Prepared for operative intervention  Hospital Course: the patient was resuscitated in the ED and started on antibiotics.he was taken the operating room and underwent laparoscopic cholecystectomy by Dr. Dema Severin.he noted omentum adherent to the gallbladder.  Gallbladder very thickened and edematous with friable gallbladder wall.  Cholecystectomy otherwise technically uneventful.     On postop day 1 she was feeling much better and wanted to go home.  She was ambulatory.  She was tolerating liquid diet and asking for regular food.  She was having no respiratory problems.  She was voiding uneventfully.  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     Examination revealed her to be alert and without distress.  Abdomen was soft.  Not distended.  No guarding or rebound.  Active bowel sounds.  All trocar sites looked good     She  has Ultram at home and did not require any further prescriptions.  She was instructed in diet and activities.  She was asked to return to wire office in 2 weeks for a postop checkup.  Consults: None  Significant Diagnostic Studies: imaging studies.  Surgical pathology  Treatments: surgery: laparoscopic cholecystectomy  Disposition: Home  Patient Instructions:  Allergies as of 07/15/2017      Reactions   Penicillins    Rash    Ivp Dye [iodinated Diagnostic Agents]    Hives    Percocet [oxycodone-acetaminophen]    Itching       Medication List    TAKE these medications   cefUROXime 250 MG tablet Commonly known as:  CEFTIN Take 1 tablet (250 mg total) by mouth 2 (two) times daily with a meal.   dicyclomine 20 MG tablet Commonly known as:  BENTYL Take 20 mg by mouth as needed for spasms.   fenofibrate 145 MG tablet Commonly known as:  TRICOR TAKE 1 TABLET BY MOUTH ONCE DAILY   glipiZIDE 5 MG tablet Commonly known as:  GLUCOTROL Take 1 tablet (5 mg total) 2 (two) times daily before a meal by mouth.   losartan-hydrochlorothiazide 100-12.5 MG tablet Commonly known as:  HYZAAR TAKE 1 TABLET BY MOUTH ONCE DAILY   omeprazole 20 MG capsule Commonly known as:  PRILOSEC TAKE 1 CAPSULE BY MOUTH ONCE DAILY   ondansetron 4 MG disintegrating tablet Commonly known as:  ZOFRAN-ODT Take 4 mg by mouth every 8 (eight) hours as needed for nausea or vomiting.   PARoxetine 30 MG tablet Commonly known as:  PAXIL TAKE 1 TABLET BY MOUTH ONCE DAILY   traMADol 50 MG tablet Commonly known as:  ULTRAM Take 50 mg by mouth every 6 (six) hours as needed for moderate pain.       Activity: No sports or heavy lifting for 3 weeks Diet: low fat, low cholesterol diet Wound Care: none needed  Follow-up:  With [place] [facility]  in 2 weeks.     Addendum: I logged on  to the  [facility] and reviewed her prescription medication history    Signed: Edsel Petrin. Dalbert Batman, M.D., FACS General and  minimally invasive surgery Breast and Colorectal Surgery  07/15/2017, 8:11 AM

## 2017-07-17 ENCOUNTER — Telehealth: Payer: Self-pay

## 2017-07-17 ENCOUNTER — Encounter (HOSPITAL_COMMUNITY): Payer: Self-pay | Admitting: Surgery

## 2017-07-17 NOTE — Anesthesia Postprocedure Evaluation (Signed)
Anesthesia Post Note  Patient: Judene Logue Shad  Procedure(s) Performed: LAPAROSCOPIC CHOLECYSTECTOMY (N/A )     Patient location during evaluation: PACU Anesthesia Type: General Level of consciousness: sedated Pain management: pain level controlled Vital Signs Assessment: post-procedure vital signs reviewed and stable Respiratory status: spontaneous breathing and respiratory function stable Cardiovascular status: stable Postop Assessment: no apparent nausea or vomiting Anesthetic complications: no          Daveigh Batty DANIEL

## 2017-07-17 NOTE — Telephone Encounter (Signed)
Called pt to let her know Dr. Eulas Post does not need to see her on the 22nd for a hospital follow up. There was no answer-left detailed vm

## 2017-07-17 NOTE — Telephone Encounter (Signed)
Transition Care Management Follow-Up Telephone Call   Date discharged and where:[facility] on 07/15/2017  How have you been since you were released from the hospital? Feeling good, no bleeding, no severe. Only have been taking tylenol as needed  Any patient concerns? None  Items Reviewed:   Meds: Y  Allergies: Y  Dietary Changes Reviewed: Y  Functional Questionnaire:  Independent-I Dependent-D  ADLs:   Dressing- I    Eating- I   Maintaining continence- I   Transferring- I   Transportation- D   Meal Prep- I   Managing Meds- I  Confirmed importance and Date/Time of follow-up visits scheduled: Dr. Eulas Post 07/28/2017 @ 8:15am   Confirmed with patient if condition worsens to call PCP or go to the Emergency Dept. Patient was given office number and encouraged to call back with questions or concerns: Yes

## 2017-07-28 ENCOUNTER — Encounter: Payer: Self-pay | Admitting: Internal Medicine

## 2017-09-08 ENCOUNTER — Other Ambulatory Visit: Payer: Self-pay | Admitting: Internal Medicine

## 2017-09-08 DIAGNOSIS — Z1231 Encounter for screening mammogram for malignant neoplasm of breast: Secondary | ICD-10-CM

## 2017-09-11 ENCOUNTER — Other Ambulatory Visit: Payer: Self-pay | Admitting: Internal Medicine

## 2017-09-11 DIAGNOSIS — E119 Type 2 diabetes mellitus without complications: Secondary | ICD-10-CM

## 2017-09-24 ENCOUNTER — Other Ambulatory Visit: Payer: Self-pay | Admitting: Internal Medicine

## 2017-10-13 ENCOUNTER — Other Ambulatory Visit: Payer: Self-pay

## 2017-10-13 DIAGNOSIS — E1122 Type 2 diabetes mellitus with diabetic chronic kidney disease: Secondary | ICD-10-CM

## 2017-10-13 DIAGNOSIS — E785 Hyperlipidemia, unspecified: Secondary | ICD-10-CM

## 2017-10-13 DIAGNOSIS — I10 Essential (primary) hypertension: Secondary | ICD-10-CM

## 2017-10-16 ENCOUNTER — Other Ambulatory Visit: Payer: Medicare Other

## 2017-10-17 ENCOUNTER — Ambulatory Visit (INDEPENDENT_AMBULATORY_CARE_PROVIDER_SITE_OTHER): Payer: Medicare Other | Admitting: Internal Medicine

## 2017-10-17 ENCOUNTER — Encounter: Payer: Self-pay | Admitting: Internal Medicine

## 2017-10-17 VITALS — BP 122/78 | HR 61 | Temp 98.1°F | Ht 64.0 in | Wt 203.0 lb

## 2017-10-17 DIAGNOSIS — I10 Essential (primary) hypertension: Secondary | ICD-10-CM | POA: Diagnosis not present

## 2017-10-17 DIAGNOSIS — F329 Major depressive disorder, single episode, unspecified: Secondary | ICD-10-CM

## 2017-10-17 DIAGNOSIS — E1122 Type 2 diabetes mellitus with diabetic chronic kidney disease: Secondary | ICD-10-CM

## 2017-10-17 DIAGNOSIS — Z9049 Acquired absence of other specified parts of digestive tract: Secondary | ICD-10-CM | POA: Diagnosis not present

## 2017-10-17 DIAGNOSIS — F32A Depression, unspecified: Secondary | ICD-10-CM

## 2017-10-17 DIAGNOSIS — E669 Obesity, unspecified: Secondary | ICD-10-CM

## 2017-10-17 DIAGNOSIS — Z79899 Other long term (current) drug therapy: Secondary | ICD-10-CM | POA: Diagnosis not present

## 2017-10-17 DIAGNOSIS — E785 Hyperlipidemia, unspecified: Secondary | ICD-10-CM

## 2017-10-17 NOTE — Patient Instructions (Signed)
Continue current medications as ordered  Will call with lab results   Follow up in 6 mos annual wellness visit/ECG

## 2017-10-17 NOTE — Progress Notes (Signed)
Patient ID: Jill Garza, female   DOB: 1951/03/12, 67 y.o.   MRN: [id number]   Location:  [facility] OFFICE  Provider: DR Arletha Grippe  Code Status:  Goals of Care:  Advanced Directives 07/14/2017  Does Patient Have a Medical Advance Directive? -  Does patient want to make changes to medical advance directive? -  Would patient like information on creating a medical advance directive? No - Patient declined     Chief Complaint  Patient presents with  . Medical Management of Chronic Issues    6 Month follow-up and fasting labs   . Medication Refill    No refills needed   . Health Maintenance    Last eye exam 06/2017, Dr.Minecy     HPI: Patient is a 67 y.o. female seen today for medical management of chronic diseases.  She was admitted to the hospital in Mar 2019 for acute cholecystitis with cholelithiasis and underwent lap chole by Dr Dema Severin on 07/14/17. Weight down 28 lbs intentionally since last OV.   She just returned home from a trip to [place].  DM - BS 120-130s. Rare low BS reaction. She takes glipizide. metformin stopped due to worsening renal fxn. A1c 6.3%. Cr 1.4; urine microalbumin/Cr 46  Hyperlipidemia - stable on fenofibrate. LDL 98; TG 140; HDL 49. She prefers to hold off on statin due to zocor prev caused cramping in her hands  Depression - stable on paxil. No SI/HI  GERD - stable on omeprazole  HTN - stable on losartan hct  Wheezing/cough - controlled on prn HFA. No formal PFTs. Past hx tobacco abuse  CKD - stage 3.improved. Cr 1.05 (range 1.01 - 1.34)  Obesity - weight down approx 28 lbs intentionally. she changed her diet and increased exercise     Past Medical History:  Diagnosis Date  . Arthritis   . Cancer ([place])    melanoma- right leg  . Depression   . GERD (gastroesophageal reflux disease)   . History of hiatal hernia   . Hypercholesterolemia   . Hypertension   . Sweat, sweating, excessive   . Type 2 diabetes mellitus ([place])     Past Surgical History:   Procedure Laterality Date  . ABDOMINAL HYSTERECTOMY  1979  . APPENDECTOMY  1964  . BREAST LUMPECTOMY WITH RADIOACTIVE SEED LOCALIZATION Left 12/31/2014   Procedure: LEFT BREAST LUMPECTOMY WITH RADIOACTIVE SEED LOCALIZATION;  Surgeon: Donnie Mesa, MD;  Location: [place];  Service: General;  Laterality: Left;  . CARPAL TUNNEL RELEASE Right 1977  . CARPAL TUNNEL RELEASE Left 2007  . CATARACT EXTRACTION Left 04/13/2014   Dr. Herbert Deaner  . CATARACT EXTRACTION Right 06/10/2014   Dr. Herbert Deaner  . CHOLECYSTECTOMY N/A 07/14/2017   Procedure: LAPAROSCOPIC CHOLECYSTECTOMY;  Surgeon: Ileana Roup, MD;  Location: [place];  Service: General;  Laterality: N/A;  . COLONOSCOPY  2009  . JOINT REPLACEMENT    . MELANOMA EXCISION    . TONSILLECTOMY  1962  . TOTAL KNEE ARTHROPLASTY Left 2012  . TOTAL KNEE ARTHROPLASTY Right 2008     reports that she quit smoking about 31 years ago. She quit after 15.00 years of use. She has never used smokeless tobacco. She reports that she drinks alcohol. She reports that she does not use drugs. Social History   Socioeconomic History  . Marital status: Married    Spouse name: Not on file  . Number of children: Not on file  . Years of education: Not on file  . Highest  education level: Not on file  Occupational History  . Not on file  Social Needs  . Financial resource strain: Not hard at all  . Food insecurity:    Worry: Never true    Inability: Never true  . Transportation needs:    Medical: No    Non-medical: No  Tobacco Use  . Smoking status: Former Smoker    Years: 15.00    Last attempt to quit: 05/09/1986    Years since quitting: 31.4  . Smokeless tobacco: Never Used  Substance and Sexual Activity  . Alcohol use: Yes    Alcohol/week: 0.0 oz    Comment: Vacation only, couple times yearly   . Drug use: No  . Sexual activity: Yes  Lifestyle  . Physical activity:    Days per week: 0 days    Minutes per session: 0 min  . Stress: Only a  little  Relationships  . Social connections:    Talks on phone: More than three times a week    Gets together: More than three times a week    Attends religious service: Never    Active member of club or organization: No    Attends meetings of clubs or organizations: Never    Relationship status: Married  . Intimate partner violence:    Fear of current or ex partner: No    Emotionally abused: No    Physically abused: No    Forced sexual activity: No  Other Topics Concern  . Not on file  Social History Narrative  . Not on file    Family History  Problem Relation Age of Onset  . Heart attack Father   . Pulmonary embolism Mother        Multiple, B/L   . Hypertension Brother   . Heart disease Brother   . Diabetes Brother   . High Cholesterol Brother   . Hypertension Brother   . High Cholesterol Brother   . Hypertension Sister   . Diabetes type II Sister   . High Cholesterol Sister   . Alcohol abuse Brother   . Reye's syndrome Daughter   . Hepatitis Brother   . Stroke Unknown   . Cancer Maternal Grandmother 76  . Colon cancer Neg Hx   . Esophageal cancer Neg Hx   . Stomach cancer Neg Hx   . Rectal cancer Neg Hx     Allergies  Allergen Reactions  . Penicillins     Rash   . Ivp Dye [Iodinated Diagnostic Agents]     Hives   . Percocet [Oxycodone-Acetaminophen]     Itching     Outpatient Encounter Medications as of 10/17/2017  Medication Sig  . cefUROXime (CEFTIN) 250 MG tablet Take 250 mg by mouth. 4 daily 1 hour prior to dental work  . fenofibrate (TRICOR) 145 MG tablet TAKE 1 TABLET BY MOUTH ONCE DAILY  . glipiZIDE (GLUCOTROL) 5 MG tablet TAKE 1 TABLET BY MOUTH TWICE DAILY BEFORE MEAL(S)  . losartan-hydrochlorothiazide (HYZAAR) 100-12.5 MG tablet TAKE 1 TABLET BY MOUTH ONCE DAILY  . omeprazole (PRILOSEC) 20 MG capsule TAKE 1 CAPSULE BY MOUTH ONCE DAILY  . PARoxetine (PAXIL) 30 MG tablet TAKE 1 TABLET BY MOUTH ONCE DAILY  . [DISCONTINUED] cefUROXime (CEFTIN) 250  MG tablet Take 1 tablet (250 mg total) by mouth 2 (two) times daily with a meal. (Patient not taking: Reported on 07/14/2017)  . [DISCONTINUED] dicyclomine (BENTYL) 20 MG tablet Take 20 mg by mouth as needed for spasms.  . [  DISCONTINUED] ondansetron (ZOFRAN-ODT) 4 MG disintegrating tablet Take 4 mg by mouth every 8 (eight) hours as needed for nausea or vomiting.  . [DISCONTINUED] traMADol (ULTRAM) 50 MG tablet Take 50 mg by mouth every 6 (six) hours as needed for moderate pain.   No facility-administered encounter medications on file as of 10/17/2017.     Review of Systems:  Review of Systems  Constitutional: Positive for fatigue.  All other systems reviewed and are negative.   Health Maintenance  Topic Date Due  . OPHTHALMOLOGY EXAM  07/27/2017  . HEMOGLOBIN A1C  10/12/2017  . INFLUENZA VACCINE  12/07/2017  . PNA vac Low Risk Adult (2 of 2 - PPSV23) 04/09/2018  . FOOT EXAM  04/18/2018  . MAMMOGRAM  05/31/2018  . TETANUS/TDAP  08/18/2019  . COLONOSCOPY  04/14/2020  . DEXA SCAN  Completed  . Hepatitis C Screening  Completed    Physical Exam: Vitals:   10/17/17 1040  BP: 122/78  Pulse: 61  Temp: 98.1 F (36.7 C)  TempSrc: Oral  SpO2: 97%  Weight: 203 lb (92.1 kg)  Height: 5\' 4"  (1.626 m)   Body mass index is 34.84 kg/m. Physical Exam  Constitutional: She is oriented to person, place, and time. She appears well-developed and well-nourished.  HENT:  Mouth/Throat: Oropharynx is clear and moist. No oropharyngeal exudate.  MMM; no oral thrush  Eyes: Pupils are equal, round, and reactive to light. No scleral icterus.  Neck: Neck supple. Carotid bruit is not present. No tracheal deviation present. No thyromegaly present.  Cardiovascular: Normal rate, regular rhythm and intact distal pulses. Exam reveals no gallop and no friction rub.  Murmur (1/6 SEM) heard. No LE edema b/l. no calf TTP.   Pulmonary/Chest: Effort normal and breath sounds normal. No stridor. No respiratory  distress. She has no wheezes. She has no rales.  Abdominal: Soft. Normal appearance and bowel sounds are normal. She exhibits no distension and no mass. There is no hepatomegaly. There is no tenderness. There is no rigidity, no rebound and no guarding. No hernia.  Obese; surgical incisions well healed  Lymphadenopathy:    She has no cervical adenopathy.  Neurological: She is alert and oriented to person, place, and time. She has normal reflexes.  Skin: Skin is warm and dry. No rash noted.  Psychiatric: She has a normal mood and affect. Her behavior is normal. Judgment and thought content normal.    Labs reviewed: Basic Metabolic Panel: Recent Labs    04/13/17 0858 07/14/17 0518 07/15/17 0414  NA 139 134* 139  K 4.4 3.9 3.9  CL 102 102 104  CO2 28 21* 25  GLUCOSE 126* 147* 152*  BUN 22 24* 13  CREATININE 1.40* 1.30* 1.05*  CALCIUM 10.7* 10.4* 9.5  MG  --   --  2.0  PHOS  --   --  3.1  TSH 2.00  --   --    Liver Function Tests: Recent Labs    10/19/16 0828 04/13/17 0858 07/14/17 0518  AST 19 21 22   ALT 18 20 20   ALKPHOS 44  --  46  BILITOT 0.5 0.4 0.3  PROT 7.1 7.3 7.9  ALBUMIN 4.3  --  4.4   Recent Labs    07/14/17 0518  LIPASE 28   No results for input(s): AMMONIA in the last 8760 hours. CBC: Recent Labs    04/13/17 0858 07/14/17 0518 07/15/17 0414  WBC 5.3 7.9 5.4  NEUTROABS 2,443 6.2  --   HGB 14.3 13.7  10.7*  HCT 43.1 40.9 34.0*  MCV 86.0 85.9 89.5  PLT 343 303 232   Lipid Panel: Recent Labs    10/19/16 0828 04/13/17 0858  CHOL 162 171  HDL 45* 49*  LDLCALC 84 98  TRIG 164* 140  CHOLHDL 3.6 3.5   Lab Results  Component Value Date   HGBA1C 6.3 (H) 04/13/2017    Procedures since last visit: No results found.  Assessment/Plan   ICD-10-CM   1. Controlled type 2 diabetes mellitus with chronic kidney disease, without long-term current use of insulin, unspecified CKD stage (HCC) E11.22   2. S/P laparoscopic cholecystectomy Z90.49   3.  Essential hypertension, benign I10   4. Hyperlipidemia with target LDL less than 100 E78.5   5. Obesity (BMI 30-39.9) E66.9   6. High risk medication use Z79.899   7. Depression, unspecified depression type F32.9    Continue current medications as ordered  Will call with lab results   Follow up in 6 mos annual wellness visit/ECG   Shean Gerding S. Perlie Gold  [facility] [address] [place], [place] [address] [phone] Cell (Monday-Friday 8 AM - 5 PM) [phone] After 5 PM and follow prompts

## 2017-10-18 ENCOUNTER — Other Ambulatory Visit: Payer: Self-pay | Admitting: Internal Medicine

## 2017-10-18 LAB — COMPLETE METABOLIC PANEL WITH GFR
AG RATIO: 1.7 (calc) (ref 1.0–2.5)
ALBUMIN MSPROF: 4.3 g/dL (ref 3.6–5.1)
ALT: 14 U/L (ref 6–29)
AST: 20 U/L (ref 10–35)
Alkaline phosphatase (APISO): 46 U/L (ref 33–130)
BILIRUBIN TOTAL: 0.4 mg/dL (ref 0.2–1.2)
BUN / CREAT RATIO: 21 (calc) (ref 6–22)
BUN: 22 mg/dL (ref 7–25)
CHLORIDE: 105 mmol/L (ref 98–110)
CO2: 28 mmol/L (ref 20–32)
Calcium: 10.4 mg/dL (ref 8.6–10.4)
Creat: 1.03 mg/dL — ABNORMAL HIGH (ref 0.50–0.99)
GFR, EST AFRICAN AMERICAN: 65 mL/min/{1.73_m2} (ref >=60)
GFR, Est Non African American: 56 mL/min/{1.73_m2} — ABNORMAL LOW (ref >=60)
GLOBULIN: 2.5 g/dL (ref 1.9–3.7)
Glucose, Bld: 78 mg/dL (ref 65–99)
POTASSIUM: 3.9 mmol/L (ref 3.5–5.3)
SODIUM: 139 mmol/L (ref 135–146)
Total Protein: 6.8 g/dL (ref 6.1–8.1)

## 2017-10-18 LAB — LIPID PANEL
CHOLESTEROL: 145 mg/dL (ref <200)
HDL: 49 mg/dL — AB (ref >50)
LDL CHOLESTEROL (CALC): 78 mg/dL
Non-HDL Cholesterol (Calc): 96 mg/dL (calc) (ref <130)
TRIGLYCERIDES: 96 mg/dL (ref <150)
Total CHOL/HDL Ratio: 3 (calc) (ref <5.0)

## 2017-10-18 LAB — CBC WITH DIFFERENTIAL/PLATELET
BASOS ABS: 48 {cells}/uL (ref 0–200)
Basophils Relative: 1.1 %
Eosinophils Absolute: 70 cells/uL (ref 15–500)
Eosinophils Relative: 1.6 %
HEMATOCRIT: 39.9 % (ref 35.0–45.0)
Hemoglobin: 13.3 g/dL (ref 11.7–15.5)
LYMPHS ABS: 2121 {cells}/uL (ref 850–3900)
MCH: 28.7 pg (ref 27.0–33.0)
MCHC: 33.3 g/dL (ref 32.0–36.0)
MCV: 86.2 fL (ref 80.0–100.0)
MPV: 9.9 fL (ref 7.5–12.5)
Monocytes Relative: 3.8 %
NEUTROS PCT: 45.3 %
Neutro Abs: 1993 cells/uL (ref 1500–7800)
PLATELETS: 298 10*3/uL (ref 140–400)
RBC: 4.63 10*6/uL (ref 3.80–5.10)
RDW: 12.6 % (ref 11.0–15.0)
TOTAL LYMPHOCYTE: 48.2 %
WBC mixed population: 167 cells/uL — ABNORMAL LOW (ref 200–950)
WBC: 4.4 10*3/uL (ref 3.8–10.8)

## 2017-10-18 LAB — HEMOGLOBIN A1C
Hgb A1c MFr Bld: 6 % of total Hgb — ABNORMAL HIGH (ref <5.7)
MEAN PLASMA GLUCOSE: 126 (calc)
eAG (mmol/L): 7 (calc)

## 2017-10-18 LAB — TSH: TSH: 0.58 mIU/L (ref 0.40–4.50)

## 2017-10-19 ENCOUNTER — Ambulatory Visit
Admission: RE | Admit: 2017-10-19 | Discharge: 2017-10-19 | Disposition: A | Discharge status: Home / Self Care | Payer: Medicare Other | Source: Ambulatory Visit | Attending: Internal Medicine | Admitting: Internal Medicine

## 2017-10-19 DIAGNOSIS — Z1231 Encounter for screening mammogram for malignant neoplasm of breast: Secondary | ICD-10-CM

## 2017-11-13 ENCOUNTER — Other Ambulatory Visit: Payer: Self-pay | Admitting: Internal Medicine

## 2017-11-28 ENCOUNTER — Encounter: Payer: Self-pay | Admitting: Internal Medicine

## 2017-12-25 ENCOUNTER — Other Ambulatory Visit: Payer: Self-pay | Admitting: Nurse Practitioner

## 2017-12-27 ENCOUNTER — Encounter: Payer: Self-pay | Admitting: Internal Medicine

## 2018-01-02 DIAGNOSIS — L821 Other seborrheic keratosis: Secondary | ICD-10-CM | POA: Diagnosis not present

## 2018-01-02 DIAGNOSIS — D225 Melanocytic nevi of trunk: Secondary | ICD-10-CM | POA: Diagnosis not present

## 2018-01-02 DIAGNOSIS — L814 Other melanin hyperpigmentation: Secondary | ICD-10-CM | POA: Diagnosis not present

## 2018-01-02 DIAGNOSIS — D1801 Hemangioma of skin and subcutaneous tissue: Secondary | ICD-10-CM | POA: Diagnosis not present

## 2018-01-02 DIAGNOSIS — Z85820 Personal history of malignant melanoma of skin: Secondary | ICD-10-CM | POA: Diagnosis not present

## 2018-02-21 ENCOUNTER — Ambulatory Visit (INDEPENDENT_AMBULATORY_CARE_PROVIDER_SITE_OTHER): Payer: Medicare Other | Admitting: *Deleted

## 2018-02-21 DIAGNOSIS — Z23 Encounter for immunization: Secondary | ICD-10-CM

## 2018-04-09 ENCOUNTER — Other Ambulatory Visit: Payer: Self-pay | Admitting: *Deleted

## 2018-04-09 DIAGNOSIS — E119 Type 2 diabetes mellitus without complications: Secondary | ICD-10-CM

## 2018-04-09 MED ORDER — GLIPIZIDE 5 MG PO TABS: ORAL_TABLET | ORAL | 1 refills | Status: DC

## 2018-04-09 MED ORDER — OMEPRAZOLE 20 MG PO CPDR: 20.0000 mg | DELAYED_RELEASE_CAPSULE | Freq: Every day | ORAL | 1 refills | Status: DC

## 2018-04-09 NOTE — Telephone Encounter (Signed)
[facility] [address] 

## 2018-04-10 ENCOUNTER — Telehealth: Payer: Self-pay | Admitting: *Deleted

## 2018-04-10 NOTE — Telephone Encounter (Signed)
We can get labs at her appt or before, whatever she prefers- if she wants to come in prior than we will get A1c, lipid, CMP and CBC

## 2018-04-10 NOTE — Telephone Encounter (Signed)
Patient calling stated that she has an appointment scheduled for 04/18/18 for physical and she is wanting to know if she should have any labs before her appointment. Please Advise.

## 2018-04-11 NOTE — Telephone Encounter (Signed)
Patient stated that she will get her labs at her appointment.

## 2018-04-18 ENCOUNTER — Ambulatory Visit (INDEPENDENT_AMBULATORY_CARE_PROVIDER_SITE_OTHER): Payer: Medicare Other

## 2018-04-18 ENCOUNTER — Ambulatory Visit (INDEPENDENT_AMBULATORY_CARE_PROVIDER_SITE_OTHER): Payer: Medicare Other | Admitting: Nurse Practitioner

## 2018-04-18 ENCOUNTER — Encounter: Payer: Self-pay | Admitting: Nurse Practitioner

## 2018-04-18 ENCOUNTER — Encounter: Payer: Medicare Other | Admitting: Internal Medicine

## 2018-04-18 VITALS — BP 128/64 | HR 89 | Temp 98.0°F | Ht 64.0 in | Wt 214.0 lb

## 2018-04-18 DIAGNOSIS — R0789 Other chest pain: Secondary | ICD-10-CM | POA: Diagnosis not present

## 2018-04-18 DIAGNOSIS — N183 Chronic kidney disease, stage 3 (moderate): Secondary | ICD-10-CM | POA: Diagnosis not present

## 2018-04-18 DIAGNOSIS — F329 Major depressive disorder, single episode, unspecified: Secondary | ICD-10-CM | POA: Diagnosis not present

## 2018-04-18 DIAGNOSIS — E785 Hyperlipidemia, unspecified: Secondary | ICD-10-CM

## 2018-04-18 DIAGNOSIS — E1122 Type 2 diabetes mellitus with diabetic chronic kidney disease: Secondary | ICD-10-CM

## 2018-04-18 DIAGNOSIS — F32A Depression, unspecified: Secondary | ICD-10-CM

## 2018-04-18 DIAGNOSIS — R300 Dysuria: Secondary | ICD-10-CM

## 2018-04-18 DIAGNOSIS — E669 Obesity, unspecified: Secondary | ICD-10-CM

## 2018-04-18 DIAGNOSIS — I10 Essential (primary) hypertension: Secondary | ICD-10-CM | POA: Diagnosis not present

## 2018-04-18 DIAGNOSIS — R002 Palpitations: Secondary | ICD-10-CM

## 2018-04-18 DIAGNOSIS — R82998 Other abnormal findings in urine: Secondary | ICD-10-CM | POA: Diagnosis not present

## 2018-04-18 DIAGNOSIS — E119 Type 2 diabetes mellitus without complications: Secondary | ICD-10-CM

## 2018-04-18 DIAGNOSIS — Z0000 Encounter for general adult medical examination without abnormal findings: Secondary | ICD-10-CM | POA: Diagnosis not present

## 2018-04-18 DIAGNOSIS — Z23 Encounter for immunization: Secondary | ICD-10-CM

## 2018-04-18 LAB — POCT URINALYSIS DIPSTICK
Glucose, UA: NEGATIVE
Nitrite, UA: NEGATIVE
Protein, UA: POSITIVE — AB
SPEC GRAV UA: 1.015 (ref 1.010–1.025)
UROBILINOGEN UA: 0.2 U/dL
pH, UA: 6.5 (ref 5.0–8.0)

## 2018-04-18 MED ORDER — ZOSTER VAC RECOMB ADJUVANTED 50 MCG/0.5ML IM SUSR: 0.5000 mL | Freq: Once | INTRAMUSCULAR | 1 refills | Status: DC

## 2018-04-18 MED ORDER — CEFDINIR 300 MG PO CAPS: 300.0000 mg | ORAL_CAPSULE | Freq: Two times a day (BID) | ORAL | 0 refills | Status: DC

## 2018-04-18 MED FILL — SHINGRIX 50 MCG SUS: 50 | 1 days supply | Qty: 1 | Fill #0

## 2018-04-18 NOTE — Progress Notes (Addendum)
Subjective:   Jill Garza is a 67 y.o. female who presents for Medicare Annual (Subsequent) preventive examination.  Last AWV-04/13/2017    Objective:     Vitals: BP 128/64 (BP Location: Left Arm, Patient Position: Sitting)   Pulse 89   Temp 98 F (36.7 C) (Oral)   Ht 5\' 4"  (1.626 m)   Wt 214 lb (97.1 kg)   SpO2 96%   BMI 36.73 kg/m   Body mass index is 36.73 kg/m.  Advanced Directives 04/18/2018 07/14/2017 07/14/2017 04/13/2017 10/21/2016 12/11/2015 09/04/2015  Does Patient Have a Medical Advance Directive? No - No No;Yes No No No  Does patient want to make changes to medical advance directive? - - - Yes (MAU/Ambulatory/Procedural Areas - Information given) - - -  Would patient like information on creating a medical advance directive? Yes (MAU/Ambulatory/Procedural Areas - Information given) No - Patient declined - - - No - patient declined information Yes - Scientist, clinical (histocompatibility and immunogenetics) given    Tobacco Social History   Tobacco Use  Smoking Status Former Smoker  . Years: 15.00  . Last attempt to quit: 05/09/1986  . Years since quitting: 31.9  Smokeless Tobacco Never Used     Counseling given: Not Answered   Clinical Intake:  Pre-visit preparation completed: No  Pain : No/denies pain     Diabetes: No  How often do you need to have someone help you when you read instructions, pamphlets, or other written materials from your doctor or pharmacy?: 1 - Never What is the last grade level you completed in school?: Masters  Interpreter Needed?: No  Information entered by :: Tyson Dense, RN  Past Medical History:  Diagnosis Date  . Arthritis   . Cancer ([place])    melanoma- right leg  . Depression   . GERD (gastroesophageal reflux disease)   . History of hiatal hernia   . Hypercholesterolemia   . Hypertension   . Sweat, sweating, excessive   . Type 2 diabetes mellitus ([place])    Past Surgical History:  Procedure Laterality Date  . ABDOMINAL HYSTERECTOMY  1979  .  APPENDECTOMY  1964  . BREAST EXCISIONAL BIOPSY Left    benign  . BREAST LUMPECTOMY WITH RADIOACTIVE SEED LOCALIZATION Left 12/31/2014   Procedure: LEFT BREAST LUMPECTOMY WITH RADIOACTIVE SEED LOCALIZATION;  Surgeon: Donnie Mesa, MD;  Location: [facility];  Service: General;  Laterality: Left;  . CARPAL TUNNEL RELEASE Right 1977  . CARPAL TUNNEL RELEASE Left 2007  . CATARACT EXTRACTION Left 04/13/2014   Dr. Herbert Deaner  . CATARACT EXTRACTION Right 06/10/2014   Dr. Herbert Deaner  . CHOLECYSTECTOMY N/A 07/14/2017   Procedure: LAPAROSCOPIC CHOLECYSTECTOMY;  Surgeon: Ileana Roup, MD;  Location: [facility];  Service: General;  Laterality: N/A;  . COLONOSCOPY  2009  . JOINT REPLACEMENT    . MELANOMA EXCISION    . TONSILLECTOMY  1962  . TOTAL KNEE ARTHROPLASTY Left 2012  . TOTAL KNEE ARTHROPLASTY Right 2008   Family History  Problem Relation Age of Onset  . Heart attack Father   . Pulmonary embolism Mother        Multiple, B/L   . Hypertension Brother   . Heart disease Brother   . Diabetes Brother   . High Cholesterol Brother   . Hypertension Brother   . High Cholesterol Brother   . Hypertension Sister   . Diabetes type II Sister   . High Cholesterol Sister   . Alcohol abuse Brother   . Reye's syndrome Daughter   .  Hepatitis Brother   . Stroke Unknown   . Cancer Maternal Grandmother 43  . Colon cancer Neg Hx   . Esophageal cancer Neg Hx   . Stomach cancer Neg Hx   . Rectal cancer Neg Hx    Social History   Socioeconomic History  . Marital status: Married    Spouse name: Not on file  . Number of children: Not on file  . Years of education: Not on file  . Highest education level: Not on file  Occupational History  . Not on file  Social Needs  . Financial resource strain: Not hard at all  . Food insecurity:    Worry: Never true    Inability: Never true  . Transportation needs:    Medical: No    Non-medical: No  Tobacco Use  . Smoking status: Former Smoker     Years: 15.00    Last attempt to quit: 05/09/1986    Years since quitting: 31.9  . Smokeless tobacco: Never Used  Substance and Sexual Activity  . Alcohol use: Yes    Alcohol/week: 0.0 standard drinks    Comment: Vacation only, couple times yearly   . Drug use: No  . Sexual activity: Yes  Lifestyle  . Physical activity:    Days per week: 0 days    Minutes per session: 0 min  . Stress: Not at all  Relationships  . Social connections:    Talks on phone: More than three times a week    Gets together: More than three times a week    Attends religious service: Never    Active member of club or organization: No    Attends meetings of clubs or organizations: Never    Relationship status: Married  Other Topics Concern  . Not on file  Social History Narrative  . Not on file    Outpatient Encounter Medications as of 04/18/2018  Medication Sig  . clindamycin (CLEOCIN) 150 MG capsule Take 600 mg by mouth once. 1 hour prior to dental surgery  . fenofibrate (TRICOR) 145 MG tablet TAKE 1 TABLET BY MOUTH ONCE DAILY  . glipiZIDE (GLUCOTROL) 5 MG tablet Take one tablet by mouth twice daily before meals  . losartan-hydrochlorothiazide (HYZAAR) 100-12.5 MG tablet TAKE 1 TABLET BY MOUTH ONCE DAILY  . omeprazole (PRILOSEC) 20 MG capsule Take 1 capsule (20 mg total) by mouth daily.  Marland Kitchen PARoxetine (PAXIL) 30 MG tablet TAKE 1 TABLET BY MOUTH ONCE DAILY  . Zoster Vaccine Adjuvanted [facility]) injection Inject 0.5 mLs into the muscle once for 1 dose.  . [DISCONTINUED] Zoster Vaccine Adjuvanted [facility]) injection Inject 0.5 mLs into the muscle once.   No facility-administered encounter medications on file as of 04/18/2018.     Activities of Daily Living In your present state of health, do you have any difficulty performing the following activities: 04/18/2018 07/14/2017  Hearing? Y N  Vision? N N  Difficulty concentrating or making decisions? N N  Walking or climbing stairs? N N  Dressing or  bathing? N N  Doing errands, shopping? N N  Preparing Food and eating ? N -  Using the Toilet? N -  In the past six months, have you accidently leaked urine? N -  Do you have problems with loss of bowel control? N -  Managing your Medications? N -  Managing your Finances? N -  Housekeeping or managing your Housekeeping? N -  Some recent data might be hidden    Patient Care Team: Dewaine Oats,  Carlos American, NP as PCP - General (Geriatric Medicine) Awanda Mink, MD as Consulting Physician (Ophthalmology)    Assessment:   This is a routine wellness examination for [place].  Exercise Activities and Dietary recommendations Current Exercise Habits: The patient does not participate in regular exercise at present, Exercise limited by: None identified  Goals      Patient Stated   . weight loss (pt-stated)     I will attempt to get under 210 lbs      Other   . Weight (lb) < 200 lb (90.7 kg)     Starting 03/18/16, I will attempt to get under 200 lbs.        Fall Risk Fall Risk  04/18/2018 10/17/2017 04/18/2017 04/13/2017 04/13/2017  Falls in the past year? 0 No No No No  Comment - - - Emmi Telephone Survey: data to providers prior to load -  Number falls in past yr: 0 - - - -  Injury with Fall? 0 - - - -   Is the patient's home free of loose throw rugs in walkways, pet beds, electrical cords, etc?   yes      Grab bars in the bathroom? no      Handrails on the stairs?   yes      Adequate lighting?   yes  Depression Screen PHQ 2/9 Scores 04/18/2018 04/13/2017 03/18/2016 06/04/2015  PHQ - 2 Score 0 0 0 0     Cognitive Function MMSE - Mini Mental State Exam 04/13/2017 03/18/2016 01/29/2015  Orientation to time 5 5 5   Orientation to Place 5 5 5   Registration 3 3 3   Attention/ Calculation 5 5 5   Recall 3 3 1   Language- name 2 objects 2 2 2   Language- repeat 1 1 1   Language- follow 3 step command 3 3 3   Language- read & follow direction 1 1 1   Write a sentence 1 1 1   Copy  design 1 1 1   Total score 30 30 28         Immunization History  Administered Date(s) Administered  . Influenza Whole 01/26/2013  . Influenza, High Dose Seasonal PF 02/21/2018  . Influenza-Unspecified 01/28/2014, 02/18/2016, 03/01/2017  . Pneumococcal Conjugate-13 12/17/2013  . Pneumococcal Polysaccharide-23 04/09/2013, 04/18/2018  . Tdap 08/17/2009  . Zoster 01/09/2011    Qualifies for Shingles Vaccine? Yes, educated and ordered to pharmacy  Screening Tests Health Maintenance  Topic Date Due  . FOOT EXAM  04/18/2018  . HEMOGLOBIN A1C  04/18/2018  . OPHTHALMOLOGY EXAM  06/19/2018  . TETANUS/TDAP  08/18/2019  . MAMMOGRAM  10/20/2019  . COLONOSCOPY  04/14/2020  . INFLUENZA VACCINE  Completed  . DEXA SCAN  Completed  . Hepatitis C Screening  Completed  . PNA vac Low Risk Adult  Completed    Cancer Screenings: Lung: Low Dose CT Chest recommended if Age 54-80 years, 30 pack-year currently smoking OR have quit w/in 15years. Patient does not qualify. Breast:  Up to date on Mammogram? Yes   Up to date of Bone Density/Dexa? Yes Colorectal: up to date  Additional Screenings:  Hepatitis C Screening: declined Pneumovax due:     Plan:    I have personally reviewed and addressed the Medicare Annual Wellness questionnaire and have noted the following in the patient's chart:  A. Medical and social history B. Use of alcohol, tobacco or illicit drugs  C. Current medications and supplements D. Functional ability and status E.  Nutritional status F.  Physical activity G.  Advance directives H. List of other physicians I.  Hospitalizations, surgeries, and ER visits in previous 12 months J.  [place] to include hearing, vision, cognitive, depression L. Referrals and appointments - none  In addition, I have reviewed and discussed with patient certain preventive protocols, quality metrics, and best practice recommendations. A written personalized care plan for preventive  services as well as general preventive health recommendations were provided to patient.  See attached scanned questionnaire for additional information.   Signed,   Tyson Dense, RN Nurse Health Advisor  Patient Concerns: frequency, urgency, dysuria, pressure in pelvic area

## 2018-04-18 NOTE — Patient Instructions (Addendum)
To start omnicef 300 mg by mouth twice daily for 1 week for UTI and hopefully help sinus  To use netipot daily to flush sinuses To start flonase 1 spray into nare daily  Work on dietary modifications Increase physical activity- low impact due to knees

## 2018-04-18 NOTE — Progress Notes (Signed)
Provider: Lauree Chandler, NP  Patient Care Team: Lauree Chandler, NP as PCP - General (Geriatric Medicine) Alanda Slim Neena Rhymes, MD as Consulting Physician (Ophthalmology)  Extended Emergency Contact Information Primary Emergency Contact: [facility] Address: [address] [place],  [address] [name] of [place] Phone: [phone] Mobile Phone: [phone] Relation: Spouse Allergies  Allergen Reactions  . Penicillins     Rash   . Ivp Dye [Iodinated Diagnostic Agents]     Hives   . Percocet [Oxycodone-Acetaminophen]     Itching    Code Status: FULL Goals of Care: Advanced Directive information Advanced Directives 04/18/2018  Does Patient Have a Medical Advance Directive? No  Does patient want to make changes to medical advance directive? -  Would patient like information on creating a medical advance directive? -     Chief Complaint  Patient presents with  . Medical Management of Chronic Issues    Pt is being seen for a physical. Pt also having burning with urination, urinary frequency.     HPI: Patient is a 67 y.o. female seen in today for an annual wellness exam.    Having problems with urination, increased frequency and urgency with pain/pressure/achyness with urination which has been ongoing for 5 days. Has been several years since her last UTI.   Major illnesses or hospitalization in the last year - had lap chole in March 2019, has been doing well since. Occasional diarrhea but this is not new. Had more problems for a little while but has improved.   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 Husband is not medical but daughters are nursing. Discussed DNR and advanced directive.   DM- has been on several vacation and not eating well. Has gained some weight since last A1c was obtained. Currently taking glipizide. Previously on metformin stopped due to renal function.   Hyperlipidemia - stable on fenofibrate. She prefers to hold off  on statin due to zocor prev caused cramping in her hands  Depression- in remission on paxil, has tried other medication but not as much benefit.   CKD - stage 3.improved. Cr 1.05 (range 1.01 - 1.34)- avoids NSAIDS  Wheezing/cough - stable, no issues currently. No formal PFTs. Past hx tobacco abuse quit in the 80s   Reports chest pressure/flutter, has hx of PVCs but this lasting a little longer. No chest pains. Lasting seconds and has not found that it correlates with anything. Does not do much physical activity.exercise but no increase with exertion     Depression screen [facility] 2/9 04/18/2018 04/18/2018 04/13/2017 03/18/2016 06/04/2015  Decreased Interest 0 0 0 0 0  Down, Depressed, Hopeless 0 0 0 0 0  PHQ - 2 Score 0 0 0 0 0    Fall Risk  04/18/2018 04/18/2018 10/17/2017 04/18/2017 04/13/2017  Falls in the past year? 0 0 No No No  Comment - - - - Emmi Telephone Survey: data to providers prior to load  Number falls in past yr: - 0 - - -  Injury with Fall? - 0 - - -   MMSE - [place] Exam 04/13/2017 03/18/2016 01/29/2015  Orientation to time 5 5 5   Orientation to Place 5 5 5   Registration 3 3 3   Attention/ Calculation 5 5 5   Recall 3 3 1   Language- name 2 objects 2 2 2   Language- repeat 1 1 1   Language- follow 3 step command 3 3 3   Language- read &  follow direction 1 1 1   Write a sentence 1 1 1   Copy design 1 1 1   Total score 30 30 28      Health Maintenance  Topic Date Due  . FOOT EXAM  04/18/2018  . HEMOGLOBIN A1C  04/18/2018  . OPHTHALMOLOGY EXAM  06/19/2018  . TETANUS/TDAP  08/18/2019  . MAMMOGRAM  10/20/2019  . COLONOSCOPY  04/14/2020  . INFLUENZA VACCINE  Completed  . DEXA SCAN  Completed  . Hepatitis C Screening  Completed  . PNA vac Low Risk Adult  Completed      Past Medical History:  Diagnosis Date  . Arthritis   . Cancer ([place])    melanoma- right leg  . Depression   . GERD (gastroesophageal reflux disease)   . History of hiatal hernia   .  Hypercholesterolemia   . Hypertension   . Sweat, sweating, excessive   . Type 2 diabetes mellitus ([place])     Past Surgical History:  Procedure Laterality Date  . ABDOMINAL HYSTERECTOMY  1979  . APPENDECTOMY  1964  . BREAST EXCISIONAL BIOPSY Left    benign  . BREAST LUMPECTOMY WITH RADIOACTIVE SEED LOCALIZATION Left 12/31/2014   Procedure: LEFT BREAST LUMPECTOMY WITH RADIOACTIVE SEED LOCALIZATION;  Surgeon: Donnie Mesa, MD;  Location: [facility];  Service: General;  Laterality: Left;  . CARPAL TUNNEL RELEASE Right 1977  . CARPAL TUNNEL RELEASE Left 2007  . CATARACT EXTRACTION Left 04/13/2014   Dr. Herbert Deaner  . CATARACT EXTRACTION Right 06/10/2014   Dr. Herbert Deaner  . CHOLECYSTECTOMY N/A 07/14/2017   Procedure: LAPAROSCOPIC CHOLECYSTECTOMY;  Surgeon: Ileana Roup, MD;  Location: [facility];  Service: General;  Laterality: N/A;  . COLONOSCOPY  2009  . JOINT REPLACEMENT    . MELANOMA EXCISION    . TONSILLECTOMY  1962  . TOTAL KNEE ARTHROPLASTY Left 2012  . TOTAL KNEE ARTHROPLASTY Right 2008    Social History   Socioeconomic History  . Marital status: Married    Spouse name: Not on file  . Number of children: Not on file  . Years of education: Not on file  . Highest education level: Not on file  Occupational History  . Not on file  Social Needs  . Financial resource strain: Not hard at all  . Food insecurity:    Worry: Never true    Inability: Never true  . Transportation needs:    Medical: No    Non-medical: No  Tobacco Use  . Smoking status: Former Smoker    Years: 15.00    Last attempt to quit: 05/09/1986    Years since quitting: 31.9  . Smokeless tobacco: Never Used  Substance and Sexual Activity  . Alcohol use: Yes    Alcohol/week: 0.0 standard drinks    Comment: Vacation only, couple times yearly   . Drug use: No  . Sexual activity: Yes  Lifestyle  . Physical activity:    Days per week: 0 days    Minutes per session: 0 min  . Stress: Not at all    Relationships  . Social connections:    Talks on phone: More than three times a week    Gets together: More than three times a week    Attends religious service: Never    Active member of club or organization: No    Attends meetings of clubs or organizations: Never    Relationship status: Married  Other Topics Concern  . Not on file  Social History Narrative  .  Not on file    Family History  Problem Relation Age of Onset  . Heart attack Father   . Pulmonary embolism Mother        Multiple, B/L   . Hypertension Brother   . Heart disease Brother   . Diabetes Brother   . High Cholesterol Brother   . Hypertension Brother   . High Cholesterol Brother   . Hypertension Sister   . Diabetes type II Sister   . High Cholesterol Sister   . Alcohol abuse Brother   . Reye's syndrome Daughter   . Hepatitis Brother   . Stroke Unknown   . Cancer Maternal Grandmother 59  . Colon cancer Neg Hx   . Esophageal cancer Neg Hx   . Stomach cancer Neg Hx   . Rectal cancer Neg Hx     Review of Systems:  Review of Systems  Constitutional: Negative for activity change, appetite change, fatigue and unexpected weight change.  HENT: Negative for congestion and hearing loss.   Eyes: Negative.   Respiratory: Negative for cough and shortness of breath.   Cardiovascular: Positive for palpitations. Negative for chest pain and leg swelling.  Gastrointestinal: Negative for abdominal pain, constipation and diarrhea.  Genitourinary: Positive for dysuria, frequency, pelvic pain and urgency. Negative for difficulty urinating, flank pain and hematuria.  Musculoskeletal: Negative for arthralgias and myalgias.  Skin: Negative for color change and wound.  Neurological: Positive for numbness. Negative for dizziness and weakness.  Psychiatric/Behavioral: Negative for agitation, behavioral problems and confusion.     Allergies as of 04/18/2018      Reactions   Penicillins    Rash    Ivp Dye [iodinated  Diagnostic Agents]    Hives    Percocet [oxycodone-acetaminophen]    Itching       Medication List        Accurate as of 04/18/18 10:33 AM. Always use your most recent med list.          clindamycin 150 MG capsule Commonly known as:  CLEOCIN Take 600 mg by mouth once. 1 hour prior to dental surgery   fenofibrate 145 MG tablet Commonly known as:  TRICOR TAKE 1 TABLET BY MOUTH ONCE DAILY   glipiZIDE 5 MG tablet Commonly known as:  GLUCOTROL Take one tablet by mouth twice daily before meals   losartan-hydrochlorothiazide 100-12.5 MG tablet Commonly known as:  HYZAAR TAKE 1 TABLET BY MOUTH ONCE DAILY   omeprazole 20 MG capsule Commonly known as:  PRILOSEC Take 1 capsule (20 mg total) by mouth daily.   PARoxetine 30 MG tablet Commonly known as:  PAXIL TAKE 1 TABLET BY MOUTH ONCE DAILY         Physical Exam: Vitals:   04/18/18 1004  BP: 128/64  Pulse: 89  Temp: 98 F (36.7 C)  SpO2: 96%  Weight: 214 lb (97.1 kg)  Height: 5\' 4"  (1.626 m)   Body mass index is 36.73 kg/m. Physical Exam Vitals signs reviewed.  Constitutional:      General: She is not in acute distress.    Appearance: She is well-developed.  HENT:     Head: Normocephalic and atraumatic.     Right Ear: Hearing, ear canal and external ear normal. Tympanic membrane is not erythematous, retracted or bulging.     Left Ear: External ear normal. No drainage or tenderness.  No middle ear effusion. Tympanic membrane is not erythematous, retracted or bulging.     Mouth/Throat:     Pharynx: No  oropharyngeal exudate.  Eyes:     General: No scleral icterus.    Pupils: Pupils are equal, round, and reactive to light.  Neck:     Musculoskeletal: Normal range of motion and neck supple.     Thyroid: No thyromegaly.     Vascular: No carotid bruit.     Trachea: No tracheal deviation.  Cardiovascular:     Rate and Rhythm: Normal rate and regular rhythm.     Heart sounds: No murmur. No friction rub. No  gallop.      Comments: No LE edema b/l. No calf TTP Pulmonary:     Effort: Pulmonary effort is normal. No respiratory distress.     Breath sounds: Normal breath sounds. No wheezing or rales.  Chest:     Chest wall: No tenderness.     Breasts: Breasts are asymmetrical (left breast smaller; incisonal scar present).        Right: No inverted nipple, mass, nipple discharge, skin change or tenderness.        Left: No inverted nipple, mass, nipple discharge, skin change or tenderness.  Abdominal:     General: Bowel sounds are normal. There is no distension.     Palpations: Abdomen is soft. There is no hepatomegaly or mass.     Tenderness: There is no abdominal tenderness. There is no guarding or rebound.     Hernia: No hernia is present.     Comments: obese  Genitourinary:    Comments: Deferred to GYN Musculoskeletal:        General: No deformity.  Lymphadenopathy:     Cervical: No cervical adenopathy.  Skin:    General: Skin is warm and dry.     Findings: No rash.     Comments: (+) tattoos  Neurological:     Mental Status: She is alert and oriented to person, place, and time.     Deep Tendon Reflexes: Reflexes are normal and symmetric.  Psychiatric:        Behavior: Behavior normal.        Thought Content: Thought content normal.        Judgment: Judgment normal.     Labs reviewed: Basic Metabolic Panel: Recent Labs    07/14/17 0518 07/15/17 0414 10/17/17 1135  NA 134* 139 139  K 3.9 3.9 3.9  CL 102 104 105  CO2 21* 25 28  GLUCOSE 147* 152* 78  BUN 24* 13 22  CREATININE 1.30* 1.05* 1.03*  CALCIUM 10.4* 9.5 10.4  MG  --  2.0  --   PHOS  --  3.1  --   TSH  --   --  0.58   Liver Function Tests: Recent Labs    07/14/17 0518 10/17/17 1135  AST 22 20  ALT 20 14  ALKPHOS 46  --   BILITOT 0.3 0.4  PROT 7.9 6.8  ALBUMIN 4.4  --    Recent Labs    07/14/17 0518  LIPASE 28   No results for input(s): AMMONIA in the last 8760 hours. CBC: Recent Labs     07/14/17 0518 07/15/17 0414 10/17/17 1135  WBC 7.9 5.4 4.4  NEUTROABS 6.2  --  1,993  HGB 13.7 10.7* 13.3  HCT 40.9 34.0* 39.9  MCV 85.9 89.5 86.2  PLT 303 232 298   Lipid Panel: Recent Labs    10/17/17 1135  CHOL 145  HDL 49*  LDLCALC 78  TRIG 96  CHOLHDL 3.0   Lab Results  Component Value Date  HGBA1C 6.0 (H) 10/17/2017    Procedures: No results found.  Assessment/Plan 1. Dysuria -encouraged to increase hydration.  - POC Urinalysis Dipstick - cefdinir (OMNICEF) 300 MG capsule; Take 1 capsule (300 mg total) by mouth 2 (two) times daily.  Dispense: 14 capsule; Refill: 0  2. Leukocytes in urine - Culture, Urine; Future - Culture, Urine - cefdinir (OMNICEF) 300 MG capsule; Take 1 capsule (300 mg total) by mouth 2 (two) times daily.  Dispense: 14 capsule; Refill: 0  3. Essential hypertension, benign -controlled on losartan-hctz, has not been following dietary modifications, encouraged make changes at this time. s - COMPLETE METABOLIC PANEL WITH GFR - CBC with Differential/Platelets  4. Hyperlipidemia with target LDL less than 100 -continues on fenofibrate 145 mg daily  - Lipid Panel  5. Obesity (BMI 30-39.9) -weight loss encouraged with dietary modifications and lifestyle changes  6. Depression, unspecified depression type Controlled with paxil.   7. Type 2 diabetes mellitus with hemoglobin A1c goal of less than 7.5% (HCC) -continue on glipizide, no hypoglycemia - Hemoglobin A1c  8. CKD stage 3 due to type 2 diabetes mellitus ([employer]) -Encourage proper hydration and to avoid NSAIDS (Aleve, Advil, Motrin, Ibuprofen)  - COMPLETE METABOLIC PANEL WITH GFR  9. Chest pressure with Fluttering sensation of heart -weekly occurrence, not related to activity, will need event monitor and further evaluation will get Ambulatory referral to Cardiology at this time.  -EKG with nonspecific T changes in leads III, aVF, and V3-6. SR rate 72   Next appt: 6 months, sooner if  needed Dessie Delcarlo K. Wallace, [facility] [phone]

## 2018-04-18 NOTE — Patient Instructions (Signed)
Jill Garza , Thank you for taking time to come for your Medicare Wellness Visit. I appreciate your ongoing commitment to your health goals. Please review the following plan we discussed and let me know if I can assist you in the future.   Screening recommendations/referrals: Colonoscopy up to date, due 04/14/2025 Mammogram up to date, due 10/20/2019 Bone Density up to date Recommended yearly ophthalmology/optometry visit for glaucoma screening and checkup Recommended yearly dental visit for hygiene and checkup  Vaccinations: Influenza vaccine up to date Pneumococcal vaccine up to date Tdap vaccine up to date, due 08/18/2019 Shingles vaccine due, ordered to [facility]    Advanced directives: Advance directive discussed with you today. I have provided a copy for you to complete at home and have notarized. Once this is complete please bring a copy in to our office so we can scan it into your chart.  Conditions/risks identified: none  Next appointment: Medicare Wellness Visit 04/22/2019 @ 9:30am   Preventive Care 28 Years and Older, Female Preventive care refers to lifestyle choices and visits with your health care provider that can promote health and wellness. What does preventive care include?  A yearly physical exam. This is also called an annual well check.  Dental exams once or twice a year.  Routine eye exams. Ask your health care provider how often you should have your eyes checked.  Personal lifestyle choices, including:  Daily care of your teeth and gums.  Regular physical activity.  Eating a healthy diet.  Avoiding tobacco and drug use.  Limiting alcohol use.  Practicing safe sex.  Taking low-dose aspirin every day.  Taking vitamin and mineral supplements as recommended by your health care provider. What happens during an annual well check? The services and screenings done by your health care provider during your annual well check will depend on your age,  overall health, lifestyle risk factors, and family history of disease. Counseling  Your health care provider may ask you questions about your:  Alcohol use.  Tobacco use.  Drug use.  Emotional well-being.  Home and relationship well-being.  Sexual activity.  Eating habits.  History of falls.  Memory and ability to understand (cognition).  Work and work Statistician.  Reproductive health. Screening  You may have the following tests or measurements:  Height, weight, and BMI.  Blood pressure.  Lipid and cholesterol levels. These may be checked every 5 years, or more frequently if you are over 15 years old.  Skin check.  Lung cancer screening. You may have this screening every year starting at age 64 if you have a 30-pack-year history of smoking and currently smoke or have quit within the past 15 years.  Fecal occult blood test (FOBT) of the stool. You may have this test every year starting at age 51.  Flexible sigmoidoscopy or colonoscopy. You may have a sigmoidoscopy every 5 years or a colonoscopy every 10 years starting at age 14.  Hepatitis C blood test.  Hepatitis B blood test.  Sexually transmitted disease (STD) testing.  Diabetes screening. This is done by checking your blood sugar (glucose) after you have not eaten for a while (fasting). You may have this done every 1-3 years.  Bone density scan. This is done to screen for osteoporosis. You may have this done starting at age 93.  Mammogram. This may be done every 1-2 years. Talk to your health care provider about how often you should have regular mammograms. Talk with your health care provider about your  test results, treatment options, and if necessary, the need for more tests. Vaccines  Your health care provider may recommend certain vaccines, such as:  Influenza vaccine. This is recommended every year.  Tetanus, diphtheria, and acellular pertussis (Tdap, Td) vaccine. You may need a Td booster every 10  years.  Zoster vaccine. You may need this after age 33.  Pneumococcal 13-valent conjugate (PCV13) vaccine. One dose is recommended after age 60.  Pneumococcal polysaccharide (PPSV23) vaccine. One dose is recommended after age 61. Talk to your health care provider about which screenings and vaccines you need and how often you need them. This information is not intended to replace advice given to you by your health care provider. Make sure you discuss any questions you have with your health care provider. Document Released: 05/22/2015 Document Revised: 01/13/2016 Document Reviewed: 02/24/2015 Elsevier Interactive Patient Education  2017 [place] Prevention in the Home Falls can cause injuries. They can happen to people of all ages. There are many things you can do to make your home safe and to help prevent falls. What can I do on the outside of my home?  Regularly fix the edges of walkways and driveways and fix any cracks.  Remove anything that might make you trip as you walk through a door, such as a raised step or threshold.  Trim any bushes or trees on the path to your home.  Use bright outdoor lighting.  Clear any walking paths of anything that might make someone trip, such as rocks or tools.  Regularly check to see if handrails are loose or broken. Make sure that both sides of any steps have handrails.  Any raised decks and porches should have guardrails on the edges.  Have any leaves, snow, or ice cleared regularly.  Use sand or salt on walking paths during winter.  Clean up any spills in your garage right away. This includes oil or grease spills. What can I do in the bathroom?  Use night lights.  Install grab bars by the toilet and in the tub and shower. Do not use towel bars as grab bars.  Use non-skid mats or decals in the tub or shower.  If you need to sit down in the shower, use a plastic, non-slip stool.  Keep the floor dry. Clean up any water that  spills on the floor as soon as it happens.  Remove soap buildup in the tub or shower regularly.  Attach bath mats securely with double-sided non-slip rug tape.  Do not have throw rugs and other things on the floor that can make you trip. What can I do in the bedroom?  Use night lights.  Make sure that you have a light by your bed that is easy to reach.  Do not use any sheets or blankets that are too big for your bed. They should not hang down onto the floor.  Have a firm chair that has side arms. You can use this for support while you get dressed.  Do not have throw rugs and other things on the floor that can make you trip. What can I do in the kitchen?  Clean up any spills right away.  Avoid walking on wet floors.  Keep items that you use a lot in easy-to-reach places.  If you need to reach something above you, use a strong step stool that has a grab bar.  Keep electrical cords out of the way.  Do not use floor polish or wax that  makes floors slippery. If you must use wax, use non-skid floor wax.  Do not have throw rugs and other things on the floor that can make you trip. What can I do with my stairs?  Do not leave any items on the stairs.  Make sure that there are handrails on both sides of the stairs and use them. Fix handrails that are broken or loose. Make sure that handrails are as long as the stairways.  Check any carpeting to make sure that it is firmly attached to the stairs. Fix any carpet that is loose or worn.  Avoid having throw rugs at the top or bottom of the stairs. If you do have throw rugs, attach them to the floor with carpet tape.  Make sure that you have a light switch at the top of the stairs and the bottom of the stairs. If you do not have them, ask someone to add them for you. What else can I do to help prevent falls?  Wear shoes that:  Do not have high heels.  Have rubber bottoms.  Are comfortable and fit you well.  Are closed at the  toe. Do not wear sandals.  If you use a stepladder:  Make sure that it is fully opened. Do not climb a closed stepladder.  Make sure that both sides of the stepladder are locked into place.  Ask someone to hold it for you, if possible.  Clearly mark and make sure that you can see:  Any grab bars or handrails.  First and last steps.  Where the edge of each step is.  Use tools that help you move around (mobility aids) if they are needed. These include:  Canes.  Walkers.  Scooters.  Crutches.  Turn on the lights when you go into a dark area. Replace any light bulbs as soon as they burn out.  Set up your furniture so you have a clear path. Avoid moving your furniture around.  If any of your floors are uneven, fix them.  If there are any pets around you, be aware of where they are.  Review your medicines with your doctor. Some medicines can make you feel dizzy. This can increase your chance of falling. Ask your doctor what other things that you can do to help prevent falls. This information is not intended to replace advice given to you by your health care provider. Make sure you discuss any questions you have with your health care provider. Document Released: 02/19/2009 Document Revised: 10/01/2015 Document Reviewed: 05/30/2014 Elsevier Interactive Patient Education  2017 [employer].

## 2018-04-19 ENCOUNTER — Encounter: Payer: Self-pay | Admitting: Cardiology

## 2018-04-19 ENCOUNTER — Other Ambulatory Visit: Payer: Self-pay

## 2018-04-19 ENCOUNTER — Ambulatory Visit (INDEPENDENT_AMBULATORY_CARE_PROVIDER_SITE_OTHER): Payer: Medicare Other | Admitting: Cardiology

## 2018-04-19 ENCOUNTER — Ambulatory Visit: Payer: Self-pay

## 2018-04-19 VITALS — BP 126/78 | HR 89 | Ht 64.0 in | Wt 215.0 lb

## 2018-04-19 DIAGNOSIS — E119 Type 2 diabetes mellitus without complications: Secondary | ICD-10-CM

## 2018-04-19 DIAGNOSIS — E785 Hyperlipidemia, unspecified: Secondary | ICD-10-CM | POA: Insufficient documentation

## 2018-04-19 DIAGNOSIS — R079 Chest pain, unspecified: Secondary | ICD-10-CM | POA: Diagnosis not present

## 2018-04-19 DIAGNOSIS — I10 Essential (primary) hypertension: Secondary | ICD-10-CM | POA: Diagnosis not present

## 2018-04-19 DIAGNOSIS — R002 Palpitations: Secondary | ICD-10-CM | POA: Diagnosis not present

## 2018-04-19 LAB — CBC WITH DIFFERENTIAL/PLATELET
Basophils Absolute: 38 cells/uL (ref 0–200)
Basophils Relative: 0.4 %
EOS PCT: 1 %
Eosinophils Absolute: 96 cells/uL (ref 15–500)
HEMATOCRIT: 42.6 % (ref 35.0–45.0)
Hemoglobin: 14.1 g/dL (ref 11.7–15.5)
Lymphs Abs: 3811 cells/uL (ref 850–3900)
MCH: 28.8 pg (ref 27.0–33.0)
MCHC: 33.1 g/dL (ref 32.0–36.0)
MCV: 86.9 fL (ref 80.0–100.0)
MPV: 9.9 fL (ref 7.5–12.5)
Monocytes Relative: 5.3 %
Neutro Abs: 5146 cells/uL (ref 1500–7800)
Neutrophils Relative %: 53.6 %
Platelets: 364 10*3/uL (ref 140–400)
RBC: 4.9 10*6/uL (ref 3.80–5.10)
RDW: 12.4 % (ref 11.0–15.0)
Total Lymphocyte: 39.7 %
WBC mixed population: 509 cells/uL (ref 200–950)
WBC: 9.6 10*3/uL (ref 3.8–10.8)

## 2018-04-19 LAB — LIPID PANEL
Cholesterol: 155 mg/dL (ref <200)
HDL: 52 mg/dL (ref >50)
LDL Cholesterol (Calc): 81 mg/dL (calc)
Non-HDL Cholesterol (Calc): 103 mg/dL (calc) (ref <130)
Total CHOL/HDL Ratio: 3 (calc) (ref <5.0)
Triglycerides: 119 mg/dL (ref <150)

## 2018-04-19 LAB — COMPLETE METABOLIC PANEL WITH GFR
AG Ratio: 1.5 (calc) (ref 1.0–2.5)
ALT: 14 U/L (ref 6–29)
AST: 17 U/L (ref 10–35)
Albumin: 4.8 g/dL (ref 3.6–5.1)
Alkaline phosphatase (APISO): 43 U/L (ref 33–130)
BUN/Creatinine Ratio: 18 (calc) (ref 6–22)
BUN: 24 mg/dL (ref 7–25)
CHLORIDE: 102 mmol/L (ref 98–110)
CO2: 29 mmol/L (ref 20–32)
CREATININE: 1.36 mg/dL — AB (ref 0.50–0.99)
Calcium: 11.3 mg/dL — ABNORMAL HIGH (ref 8.6–10.4)
GFR, Est African American: 46 mL/min/{1.73_m2} — ABNORMAL LOW (ref >=60)
GFR, Est Non African American: 40 mL/min/{1.73_m2} — ABNORMAL LOW (ref >=60)
Globulin: 3.1 g/dL (calc) (ref 1.9–3.7)
Glucose, Bld: 54 mg/dL — ABNORMAL LOW (ref 65–99)
Potassium: 4.2 mmol/L (ref 3.5–5.3)
Sodium: 140 mmol/L (ref 135–146)
Total Bilirubin: 0.5 mg/dL (ref 0.2–1.2)
Total Protein: 7.9 g/dL (ref 6.1–8.1)

## 2018-04-19 LAB — HEMOGLOBIN A1C
Hgb A1c MFr Bld: 6.1 % of total Hgb — ABNORMAL HIGH (ref <5.7)
Mean Plasma Glucose: 128 (calc)
eAG (mmol/L): 7.1 (calc)

## 2018-04-19 NOTE — Progress Notes (Signed)
Cardiology Office Note   Date:  04/19/2018   ID:  Jill, Garza Oct 02, 1950, MRN [id number]  PCP:  Lauree Chandler, NP  Cardiologist:   No primary care provider on file. Referring:  Lauree Chandler, NP  No chief complaint on file.     History of Present Illness: Jill Garza is a 67 y.o. female who presents for evaluation of palpitations and chest discomfort.  She has had a history of PVCs which she knows feels like an isolated flip.  However, she is developed a quicker flutter in her chest and its consecutive beats that might last for 30 seconds or a minute.  Seems to happen sporadically and perhaps weekly.  It is much more intense than what she had before.  She reduced her caffeine and did not have any improvement with this.  She cannot bring it on.  She is not had any presyncope or syncope.  She has not had any nausea or vomiting.  She has had some chest discomfort occasionally with this.  It seems to go through to her back and itself mild pressure.  There might be some left arm aching.  This is very brief typically.  She is not had prior cardiac history but she has lots of risk factors.  She had a negative treadmill stress test in 2015.  She is not particularly active but with activity such as climbing the stairs or vacuuming she feels okay.  She is had no recent work-up.  She did have some labs and her calcium is slightly elevated and is being evaluated.  Thyroid was okay.  Other electrolytes were fine.   Past Medical History:  Diagnosis Date  . Arthritis   . Cancer ([place])    melanoma- right leg  . Depression   . GERD (gastroesophageal reflux disease)   . History of hiatal hernia   . Hypercholesterolemia   . Hypertension   . Sweat, sweating, excessive   . Type 2 diabetes mellitus ([place])     Past Surgical History:  Procedure Laterality Date  . ABDOMINAL HYSTERECTOMY  1979  . APPENDECTOMY  1964  . BREAST EXCISIONAL BIOPSY Left    benign  . BREAST  LUMPECTOMY WITH RADIOACTIVE SEED LOCALIZATION Left 12/31/2014   Procedure: LEFT BREAST LUMPECTOMY WITH RADIOACTIVE SEED LOCALIZATION;  Surgeon: Donnie Mesa, MD;  Location: [facility];  Service: General;  Laterality: Left;  . CARPAL TUNNEL RELEASE Right 1977  . CARPAL TUNNEL RELEASE Left 2007  . CATARACT EXTRACTION Left 04/13/2014   Dr. Herbert Deaner  . CATARACT EXTRACTION Right 06/10/2014   Dr. Herbert Deaner  . CHOLECYSTECTOMY N/A 07/14/2017   Procedure: LAPAROSCOPIC CHOLECYSTECTOMY;  Surgeon: Ileana Roup, MD;  Location: [facility];  Service: General;  Laterality: N/A;  . COLONOSCOPY  2009  . JOINT REPLACEMENT    . MELANOMA EXCISION    . TONSILLECTOMY  1962  . TOTAL KNEE ARTHROPLASTY Left 2012  . TOTAL KNEE ARTHROPLASTY Right 2008     Current Outpatient Medications  Medication Sig Dispense Refill  . cefdinir (OMNICEF) 300 MG capsule Take 1 capsule (300 mg total) by mouth 2 (two) times daily. 14 capsule 0  . clindamycin (CLEOCIN) 150 MG capsule Take 600 mg by mouth once. 1 hour prior to dental surgery    . fenofibrate (TRICOR) 145 MG tablet TAKE 1 TABLET BY MOUTH ONCE DAILY 90 tablet 1  . glipiZIDE (GLUCOTROL) 5 MG tablet Take one tablet by mouth twice daily before meals 180  tablet 1  . losartan-hydrochlorothiazide (HYZAAR) 100-12.5 MG tablet TAKE 1 TABLET BY MOUTH ONCE DAILY 90 tablet 1  . omeprazole (PRILOSEC) 20 MG capsule Take 1 capsule (20 mg total) by mouth daily. 90 capsule 1  . PARoxetine (PAXIL) 30 MG tablet TAKE 1 TABLET BY MOUTH ONCE DAILY 90 tablet 1   No current facility-administered medications for this visit.     Allergies:   Penicillins; Ivp dye [iodinated diagnostic agents]; and Percocet [oxycodone-acetaminophen]    Social History:  The patient  reports that she quit smoking about 31 years ago. She quit after 15.00 years of use. She has never used smokeless tobacco. She reports current alcohol use. She reports that she does not use drugs.   Family History:  The  patient's family history includes Alcohol abuse in her brother; Cancer (age of onset: 70) in her maternal grandmother; Diabetes in her brother; Diabetes type II in her sister; Heart attack in her father; Heart disease (age of onset: 45) in her brother; Hepatitis in her brother; High Cholesterol in her brother, brother, and sister; Hypertension in her brother, brother, and sister; Pulmonary embolism in her mother; Reye's syndrome in her daughter; Stroke in an other family member.    ROS:  Please see the history of present illness.   Otherwise, review of systems are positive for none.   All other systems are reviewed and negative.    PHYSICAL EXAM: VS:  BP 126/78 Comment: right arm  Pulse 89   Ht 5\' 4"  (1.626 m)   Wt 215 lb (97.5 kg)   BMI 36.90 kg/m  , BMI Body mass index is 36.9 kg/m. GENERAL:  Well appearing HEENT:  Pupils equal round and reactive, fundi not visualized, oral mucosa unremarkable NECK:  No jugular venous distention, waveform within normal limits, carotid upstroke brisk and symmetric, no bruits, no thyromegaly LYMPHATICS:  No cervical, inguinal adenopathy LUNGS:  Clear to auscultation bilaterally BACK:  No CVA tenderness CHEST:  Unremarkable HEART:  PMI not displaced or sustained,S1 and S2 within normal limits, no S3, no S4, no clicks, no rubs, no murmurs ABD:  Flat, positive bowel sounds normal in frequency in pitch, no bruits, no rebound, no guarding, no midline pulsatile mass, no hepatomegaly, no splenomegaly EXT:  2 plus pulses throughout, no edema, no cyanosis no clubbing SKIN:  No rashes no nodules NEURO:  Cranial nerves II through XII grossly intact, motor grossly intact throughout PSYCH:  Cognitively intact, oriented to person place and time    EKG:  EKG is not ordered today. The ekg ordered 04/18/18 demonstrates sinus rhythm, rate 72, axis within normal limits, intervals within normal limits, no acute ST-T wave changes.   Recent Labs: 07/15/2017: Magnesium  2.0 10/17/2017: TSH 0.58 04/18/2018: ALT 14; BUN 24; Creat 1.36; Hemoglobin 14.1; Platelets 364; Potassium 4.2; Sodium 140    Lipid Panel    Component Value Date/Time   CHOL 155 04/18/2018 1117   CHOL 177 09/02/2015 0846   TRIG 119 04/18/2018 1117   HDL 52 04/18/2018 1117   HDL 50 09/02/2015 0846   CHOLHDL 3.0 04/18/2018 1117   VLDL 33 (H) 10/19/2016 0828   LDLCALC 81 04/18/2018 1117      Wt Readings from Last 3 Encounters:  04/19/18 215 lb (97.5 kg)  04/18/18 214 lb (97.1 kg)  04/18/18 214 lb (97.1 kg)      Other studies Reviewed: Additional studies/ records that were reviewed today include: Labs. Review of the above records demonstrates:  Please see elsewhere  in the note.     ASSESSMENT AND PLAN:  CHEST PAIN:   This is atypical.  However, she has significant cardiovascular risk factors. I will bring the patient back for a POET (Plain Old Exercise Test). This will allow me to screen for obstructive coronary disease, risk stratify and very importantly provide a prescription for exercise.  PALPITATIONS: I gave her the options of monitor and she chooses to try to use an [employer].  She can send those to me electronically and I will review these and further management will be based on this.  HTN:  The blood pressure is at target. No change in medications is indicated. We will continue with therapeutic lifestyle changes (TLC).  DM: A1c was 6.0.  She will continue the meds as listed.  WEIGHT: We talked specifically about diet and exercise regimen.  DYSLIPIDEMIA: Her lipids were good as above.  She will continue without any change in therapy.   Current medicines are reviewed at length with the patient today.  The patient does not have concerns regarding medicines.  The following changes have been made:  no change che Labs/ tests ordered today include:   Orders Placed This Encounter  Procedures  . EXERCISE TOLERANCE TEST (ETT)     Disposition:   FU with me in six  months if needed.      Signed, Minus Breeding, MD  04/19/2018 3:12 PM    [place]

## 2018-04-19 NOTE — Patient Instructions (Signed)
Medication Instructions:  Continue current medications  If you need a refill on your cardiac medications before your next appointment, please call your pharmacy.  Labwork: None ordered   If you have labs (blood work) drawn today and your tests are completely normal, you will receive your results only by [employer] (if you have MyChart) -OR- A paper copy in the mail.  If you have any lab test that is abnormal or we need to change your treatment, we will call you to review these results.  Testing/Procedures: Your physician has requested that you have an exercise tolerance test. For further information please visit [email or web address]. Please also follow instruction sheet, as given.   Follow-Up: You will need a follow up appointment in 6 months.  Please call our office 2 months in advance to schedule this appointment.  You may see Dr Percival Spanish or one of the following Advanced Practice Providers on your designated Care Team:   Rosaria Ferries, PA-C . Jory Sims, DNP,   At [facility], you and your health needs are our priority.  As part of our continuing mission to provide you with exceptional heart care, we have created designated Provider Care Teams.  These Care Teams include your primary Cardiologist (physician) and Advanced Practice Providers (APPs -  Physician Assistants and Nurse Practitioners) who all work together to provide you with the care you need, when you need it.

## 2018-04-20 ENCOUNTER — Other Ambulatory Visit: Payer: Medicare Other

## 2018-04-20 LAB — URINE CULTURE
MICRO NUMBER:: 91483703
SPECIMEN QUALITY:: ADEQUATE

## 2018-04-23 ENCOUNTER — Telehealth: Payer: Self-pay

## 2018-04-23 LAB — PTH, INTACT AND CALCIUM
Calcium: 10.5 mg/dL — ABNORMAL HIGH (ref 8.6–10.4)
PTH: 59 pg/mL (ref 14–64)

## 2018-04-23 NOTE — Telephone Encounter (Signed)
Discussed results with patient, patient verbalized understanding of results  Placed 24 hour container at the front desk for pick-up with instructions on collection process (provided by [facility] lab tech, Olivia Mackie)   Future order placed for 24 hour urine

## 2018-04-23 NOTE — Telephone Encounter (Signed)
-----   Message from Lauree Chandler, NP sent at 04/23/2018 12:43 PM EST ----- Normal PTH but high calcium, lets have her obtain get a 24 hour urinary calcium level at this time.

## 2018-04-27 ENCOUNTER — Other Ambulatory Visit: Payer: Self-pay

## 2018-04-28 LAB — CALCIUM, URINE, 24 HOUR: CALCIUM 24H UR: 167 mg/(24.h)

## 2018-05-03 ENCOUNTER — Telehealth (HOSPITAL_COMMUNITY): Payer: Self-pay

## 2018-05-03 NOTE — Telephone Encounter (Signed)
Encounter complete. 

## 2018-05-04 NOTE — Telephone Encounter (Signed)
complete

## 2018-05-08 ENCOUNTER — Ambulatory Visit (HOSPITAL_COMMUNITY)
Admission: RE | Admit: 2018-05-08 | Discharge: 2018-05-08 | Disposition: A | Discharge status: Home / Self Care | Payer: Medicare Other | Source: Ambulatory Visit | Attending: Cardiology | Admitting: Cardiology

## 2018-05-08 DIAGNOSIS — R079 Chest pain, unspecified: Secondary | ICD-10-CM | POA: Diagnosis not present

## 2018-05-08 LAB — EXERCISE TOLERANCE TEST
CHL CUP MPHR: 151 {beats}/min
Estimated workload: 7 METS
Exercise duration (min): 5 min
Exercise duration (sec): 56 s
Peak HR: 141 {beats}/min
Percent HR: 93 %
RPE: 17
Rest HR: 80 {beats}/min

## 2018-05-14 ENCOUNTER — Other Ambulatory Visit: Payer: Self-pay | Admitting: *Deleted

## 2018-05-14 MED ORDER — FENOFIBRATE 145 MG PO TABS: 145.0000 mg | ORAL_TABLET | Freq: Every day | ORAL | 1 refills | Status: DC

## 2018-05-14 NOTE — Telephone Encounter (Signed)
Patient requested refill Faxed to pharmacy.  

## 2018-05-22 ENCOUNTER — Other Ambulatory Visit: Payer: Self-pay | Admitting: *Deleted

## 2018-05-22 MED ORDER — PAROXETINE HCL 30 MG PO TABS: 30.0000 mg | ORAL_TABLET | Freq: Every day | ORAL | 1 refills | Status: DC

## 2018-05-22 NOTE — Telephone Encounter (Signed)
[facility] [address] 

## 2018-06-29 ENCOUNTER — Other Ambulatory Visit: Payer: Self-pay | Admitting: *Deleted

## 2018-06-29 MED ORDER — LOSARTAN POTASSIUM-HCTZ 100-12.5 MG PO TABS: 1.0000 | ORAL_TABLET | Freq: Every day | ORAL | 1 refills | Status: DC

## 2018-06-29 NOTE — Telephone Encounter (Signed)
[facility] [address] 

## 2018-07-03 ENCOUNTER — Other Ambulatory Visit: Payer: Self-pay | Admitting: *Deleted

## 2018-07-03 MED ORDER — LOSARTAN POTASSIUM-HCTZ 100-12.5 MG PO TABS: 1.0000 | ORAL_TABLET | Freq: Every day | ORAL | 1 refills | Status: DC

## 2018-07-03 MED FILL — SHINGRIX 50 MCG SUS: 50 | 1 days supply | Qty: 1 | Fill #1

## 2018-07-03 NOTE — Telephone Encounter (Signed)
Patient requested refill. Pharmacy did not received [facility] on Friday. Resent

## 2018-09-21 DIAGNOSIS — E119 Type 2 diabetes mellitus without complications: Secondary | ICD-10-CM | POA: Diagnosis not present

## 2018-09-29 ENCOUNTER — Other Ambulatory Visit: Payer: Self-pay | Admitting: Nurse Practitioner

## 2018-09-29 DIAGNOSIS — E119 Type 2 diabetes mellitus without complications: Secondary | ICD-10-CM

## 2018-10-16 ENCOUNTER — Other Ambulatory Visit: Payer: Self-pay | Admitting: Nurse Practitioner

## 2018-10-16 DIAGNOSIS — E119 Type 2 diabetes mellitus without complications: Secondary | ICD-10-CM

## 2018-10-16 DIAGNOSIS — I10 Essential (primary) hypertension: Secondary | ICD-10-CM

## 2018-10-16 DIAGNOSIS — E785 Hyperlipidemia, unspecified: Secondary | ICD-10-CM

## 2018-10-19 ENCOUNTER — Other Ambulatory Visit: Payer: Medicare Other

## 2018-10-19 ENCOUNTER — Other Ambulatory Visit: Payer: Self-pay

## 2018-10-19 DIAGNOSIS — E119 Type 2 diabetes mellitus without complications: Secondary | ICD-10-CM

## 2018-10-19 DIAGNOSIS — I10 Essential (primary) hypertension: Secondary | ICD-10-CM | POA: Diagnosis not present

## 2018-10-19 DIAGNOSIS — E785 Hyperlipidemia, unspecified: Secondary | ICD-10-CM

## 2018-10-20 LAB — CBC WITH DIFFERENTIAL/PLATELET
Absolute Monocytes: 291 cells/uL (ref 200–950)
Basophils Absolute: 42 cells/uL (ref 0–200)
Basophils Relative: 0.8 %
Eosinophils Absolute: 99 cells/uL (ref 15–500)
Eosinophils Relative: 1.9 %
HCT: 41.9 % (ref 35.0–45.0)
Hemoglobin: 13.9 g/dL (ref 11.7–15.5)
Lymphs Abs: 2314 cells/uL (ref 850–3900)
MCH: 28.8 pg (ref 27.0–33.0)
MCHC: 33.2 g/dL (ref 32.0–36.0)
MCV: 86.9 fL (ref 80.0–100.0)
MPV: 9.5 fL (ref 7.5–12.5)
Monocytes Relative: 5.6 %
Neutro Abs: 2454 cells/uL (ref 1500–7800)
Neutrophils Relative %: 47.2 %
Platelets: 292 10*3/uL (ref 140–400)
RBC: 4.82 10*6/uL (ref 3.80–5.10)
RDW: 12.5 % (ref 11.0–15.0)
Total Lymphocyte: 44.5 %
WBC: 5.2 10*3/uL (ref 3.8–10.8)

## 2018-10-20 LAB — COMPLETE METABOLIC PANEL WITH GFR
AG Ratio: 1.8 (calc) (ref 1.0–2.5)
ALT: 17 U/L (ref 6–29)
AST: 19 U/L (ref 10–35)
Albumin: 4.5 g/dL (ref 3.6–5.1)
Alkaline phosphatase (APISO): 39 U/L (ref 37–153)
BUN/Creatinine Ratio: 19 (calc) (ref 6–22)
BUN: 25 mg/dL (ref 7–25)
CO2: 27 mmol/L (ref 20–32)
Calcium: 11 mg/dL — ABNORMAL HIGH (ref 8.6–10.4)
Chloride: 102 mmol/L (ref 98–110)
Creat: 1.33 mg/dL — ABNORMAL HIGH (ref 0.50–0.99)
GFR, Est African American: 47 mL/min/{1.73_m2} — ABNORMAL LOW (ref >=60)
GFR, Est Non African American: 41 mL/min/{1.73_m2} — ABNORMAL LOW (ref >=60)
Globulin: 2.5 g/dL (calc) (ref 1.9–3.7)
Glucose, Bld: 142 mg/dL — ABNORMAL HIGH (ref 65–99)
Potassium: 4.6 mmol/L (ref 3.5–5.3)
Sodium: 138 mmol/L (ref 135–146)
Total Bilirubin: 0.4 mg/dL (ref 0.2–1.2)
Total Protein: 7 g/dL (ref 6.1–8.1)

## 2018-10-20 LAB — LIPID PANEL
Cholesterol: 179 mg/dL (ref <200)
HDL: 49 mg/dL — ABNORMAL LOW (ref >=50)
LDL Cholesterol (Calc): 104 mg/dL (calc) — ABNORMAL HIGH
Non-HDL Cholesterol (Calc): 130 mg/dL (calc) — ABNORMAL HIGH (ref <130)
Total CHOL/HDL Ratio: 3.7 (calc) (ref <5.0)
Triglycerides: 145 mg/dL (ref <150)

## 2018-10-20 LAB — HEMOGLOBIN A1C
Hgb A1c MFr Bld: 6.5 % of total Hgb — ABNORMAL HIGH (ref <5.7)
Mean Plasma Glucose: 140 (calc)
eAG (mmol/L): 7.7 (calc)

## 2018-10-23 ENCOUNTER — Encounter: Payer: Self-pay | Admitting: Nurse Practitioner

## 2018-10-23 ENCOUNTER — Ambulatory Visit (INDEPENDENT_AMBULATORY_CARE_PROVIDER_SITE_OTHER): Payer: Medicare Other | Admitting: Nurse Practitioner

## 2018-10-23 ENCOUNTER — Ambulatory Visit: Payer: Medicare Other | Admitting: Nurse Practitioner

## 2018-10-23 ENCOUNTER — Other Ambulatory Visit: Payer: Self-pay

## 2018-10-23 DIAGNOSIS — E1122 Type 2 diabetes mellitus with diabetic chronic kidney disease: Secondary | ICD-10-CM | POA: Diagnosis not present

## 2018-10-23 DIAGNOSIS — F329 Major depressive disorder, single episode, unspecified: Secondary | ICD-10-CM

## 2018-10-23 DIAGNOSIS — N183 Chronic kidney disease, stage 3 (moderate): Secondary | ICD-10-CM | POA: Diagnosis not present

## 2018-10-23 DIAGNOSIS — E119 Type 2 diabetes mellitus without complications: Secondary | ICD-10-CM | POA: Diagnosis not present

## 2018-10-23 DIAGNOSIS — I10 Essential (primary) hypertension: Secondary | ICD-10-CM

## 2018-10-23 DIAGNOSIS — E785 Hyperlipidemia, unspecified: Secondary | ICD-10-CM | POA: Diagnosis not present

## 2018-10-23 DIAGNOSIS — F32A Depression, unspecified: Secondary | ICD-10-CM

## 2018-10-23 NOTE — Progress Notes (Signed)
Patient ID: Jill Garza, female   DOB: Jun 03, 1950, 68 y.o.   MRN: [id number] This service is provided via telemedicine  No vital signs collected/recorded due to the encounter was a telemedicine visit.   Location of patient (ex: home, work):  Home  Patient consents to a telephone visit:  Yes  Location of the provider (ex: office, home):  Office  Name of any referring provider:  N/A  Names of all persons participating in the telemedicine service and their role in the encounter:  Patient, Marisa Cyphers RMA, Sherrie Mustache NP  Time spent on call:  10 min     Careteam: Patient Care Team: Lauree Chandler, NP as PCP - General (Geriatric Medicine) Alanda Slim Neena Rhymes, MD as Consulting Physician (Ophthalmology)  Advanced Directive information Does Patient Have a Medical Advance Directive?: No, Would patient like information on creating a medical advance directive?: No - Patient declined  Allergies  Allergen Reactions  . Penicillins     Rash   . Ivp Dye [Iodinated Diagnostic Agents]     Hives   . Percocet [Oxycodone-Acetaminophen]     Itching     Chief Complaint  Patient presents with  . Medical Management of Chronic Issues    6 Month Follow Up     HPI: Patient is a 68 y.o. female for routine follow up.   DM- Currently taking glipizide. No hypoglycemic episodes, has had poor diet since COVID. Plans to change diet as eating has gotten worse recently.   Hyperlipidemia - LDL up due to poor diet. on fenofibrate. She prefers to hold off on statin due to zocor prev caused cramping in her hands  Depression- stable in remission on paxil.   CKD - stage 3; stable.  Cr 1.3- avoids NSAIDS  Wheezing/cough -stable, no issues currently. No formal PFTs. Past hx tobacco abuse quit in the 80s. No trouble with this currently  GERD- occasional due to diet. On omeprazole which generally controls symptoms.   Continues to chest pressure and flutter with PVCs- saw Dr Percival Spanish  who did stress test which was normal. Continues to follow up with cardiology.  HTN- has not check blood pressure recently- on losartan- hctz; previously stable.    Review of Systems:  Review of Systems  Constitutional: Negative for chills, fever and weight loss.  HENT: Negative for tinnitus.   Respiratory: Negative for cough, sputum production and shortness of breath.   Cardiovascular: Positive for palpitations. Negative for chest pain and leg swelling.       Went a while without palpitations/chest pressure but will have occasional episode.   Gastrointestinal: Negative for abdominal pain, constipation, diarrhea and heartburn.  Genitourinary: Negative for dysuria, frequency and urgency.  Musculoskeletal: Negative for back pain, falls, joint pain, myalgias and neck pain.  Skin: Negative.   Neurological: Negative for dizziness and headaches.  Psychiatric/Behavioral: Negative for depression and memory loss. The patient does not have insomnia.        Mood controlled on paxil    Past Medical History:  Diagnosis Date  . Arthritis   . Cancer ([place])    melanoma- right leg  . Depression   . GERD (gastroesophageal reflux disease)   . History of hiatal hernia   . Hypercholesterolemia   . Hypertension   . Sweat, sweating, excessive   . Type 2 diabetes mellitus ([place])    Past Surgical History:  Procedure Laterality Date  . ABDOMINAL HYSTERECTOMY  1979  . APPENDECTOMY  1964  . BREAST EXCISIONAL BIOPSY Left  benign  . BREAST LUMPECTOMY WITH RADIOACTIVE SEED LOCALIZATION Left 12/31/2014   Procedure: LEFT BREAST LUMPECTOMY WITH RADIOACTIVE SEED LOCALIZATION;  Surgeon: Donnie Mesa, MD;  Location: [place];  Service: General;  Laterality: Left;  . CARPAL TUNNEL RELEASE Right 1977  . CARPAL TUNNEL RELEASE Left 2007  . CATARACT EXTRACTION Left 04/13/2014   Dr. Herbert Deaner  . CATARACT EXTRACTION Right 06/10/2014   Dr. Herbert Deaner  . CHOLECYSTECTOMY N/A 07/14/2017   Procedure:  LAPAROSCOPIC CHOLECYSTECTOMY;  Surgeon: Ileana Roup, MD;  Location: [place];  Service: General;  Laterality: N/A;  . COLONOSCOPY  2009  . JOINT REPLACEMENT    . MELANOMA EXCISION    . TONSILLECTOMY  1962  . TOTAL KNEE ARTHROPLASTY Left 2012  . TOTAL KNEE ARTHROPLASTY Right 2008   Social History:   reports that she quit smoking about 32 years ago. She quit after 15.00 years of use. She has never used smokeless tobacco. She reports current alcohol use. She reports that she does not use drugs.  Family History  Problem Relation Age of Onset  . Heart attack Father        Died of MI age 66  . Pulmonary embolism Mother        Multiple, B/L   . Hypertension Brother   . Heart disease Brother 40  . Diabetes Brother   . High Cholesterol Brother   . Hypertension Brother   . High Cholesterol Brother   . Hypertension Sister   . Diabetes type II Sister   . High Cholesterol Sister   . Alcohol abuse Brother   . Reye's syndrome Daughter   . Hepatitis Brother   . Stroke Other   . Cancer Maternal Grandmother 45  . Colon cancer Neg Hx   . Esophageal cancer Neg Hx   . Stomach cancer Neg Hx   . Rectal cancer Neg Hx     Medications: Patient's Medications  New Prescriptions   No medications on file  Previous Medications   FENOFIBRATE (TRICOR) 145 MG TABLET    Take 1 tablet (145 mg total) by mouth daily.   GLIPIZIDE (GLUCOTROL) 5 MG TABLET    TAKE 1 TABLET BY MOUTH TWICE DAILY BEFORE MEAL(S)   LOSARTAN-HYDROCHLOROTHIAZIDE (HYZAAR) 100-12.5 MG TABLET    Take 1 tablet by mouth daily.   OMEPRAZOLE (PRILOSEC) 20 MG CAPSULE    Take 1 capsule by mouth once daily   PAROXETINE (PAXIL) 30 MG TABLET    Take 1 tablet (30 mg total) by mouth daily.  Modified Medications   No medications on file  Discontinued Medications   CEFDINIR (OMNICEF) 300 MG CAPSULE    Take 1 capsule (300 mg total) by mouth 2 (two) times daily.   CLINDAMYCIN (CLEOCIN) 150 MG CAPSULE    Take 600 mg by mouth once. 1 hour prior  to dental surgery    Physical Exam:  There were no vitals filed for this visit. There is no height or weight on file to calculate BMI. Wt Readings from Last 3 Encounters:  04/19/18 215 lb (97.5 kg)  04/18/18 214 lb (97.1 kg)  04/18/18 214 lb (97.1 kg)    Labs reviewed: Basic Metabolic Panel: Recent Labs    04/18/18 1117 04/20/18 0909 10/19/18 0806  NA 140  --  138  K 4.2  --  4.6  CL 102  --  102  CO2 29  --  27  GLUCOSE 54*  --  142*  BUN 24  --  25  CREATININE 1.36*  --  1.33*  CALCIUM 11.3* 10.5* 11.0*   Liver Function Tests: Recent Labs    04/18/18 1117 10/19/18 0806  AST 17 19  ALT 14 17  BILITOT 0.5 0.4  PROT 7.9 7.0   No results for input(s): LIPASE, AMYLASE in the last 8760 hours. No results for input(s): AMMONIA in the last 8760 hours. CBC: Recent Labs    04/18/18 1117 10/19/18 0806  WBC 9.6 5.2  NEUTROABS 5,146 2,454  HGB 14.1 13.9  HCT 42.6 41.9  MCV 86.9 86.9  PLT 364 292   Lipid Panel: Recent Labs    04/18/18 1117 10/19/18 0806  CHOL 155 179  HDL 52 49*  LDLCALC 81 104*  TRIG 119 145  CHOLHDL 3.0 3.7   TSH: No results for input(s): TSH in the last 8760 hours. A1C: Lab Results  Component Value Date   HGBA1C 6.5 (H) 10/19/2018     Assessment/Plan 1. Essential hypertension, benign -goal bp <140/90, generally in good control. Continues on losartan-hctz daily with diet modifications. - CMP with eGFR([facility]); Future - CBC with Differential/Platelet; Future  2. Hyperlipidemia with target LDL less than 100 Elevated from previous due to poor diet choices, willing to change diet at this time. Continues on fenofibrate 145 mg daily - Lipid Panel; Future  3. Type 2 diabetes mellitus with hemoglobin A1c goal of less than 7% (HCC) -worse than prior but still acceptable range, Continues on glipizide. Discussed dietary modifications as she has been eating poorly. Plans to change diet at this time. Encouraged increase phyical activity as  well.  - Hemoglobin A1c; Future  4. Depression, unspecified depression type Mood stable on paxil  5. CKD stage 3 due to type 2 diabetes mellitus (HCC) Stable. Encourage proper hydration and to avoid NSAIDS (Aleve, Advil, Motrin, Ibuprofen)  - CMP with eGFR([facility]); Future - CBC with Differential/Platelet; Future  6. Serum calcium elevated -stable. No myalgias. Overall doing well without change. - CMP with eGFR([facility]); Future   Next appt: 6 months, lab work prior to visit.  Carlos American. Harle Battiest  [facility] [phone]   Virtual Visit via Video Note- FACETIME  I connected with Yesica Kemler Satter on 10/23/18 at  8:30 AM EDT by a video enabled telemedicine application and verified that I am speaking with the correct person using two identifiers.  Location: Patient: home Provider: office   I discussed the limitations of evaluation and management by telemedicine and the availability of in person appointments. The patient expressed understanding and agreed to proceed.    I discussed the assessment and treatment plan with the patient. The patient was provided an opportunity to ask questions and all were answered. The patient agreed with the plan and demonstrated an understanding of the instructions.   The patient was advised to call back or seek an in-person evaluation if the symptoms worsen or if the condition fails to improve as anticipated.  I provided 18 minutes of non-face-to-face time during this encounter.  Carlos American. Dewaine Oats, AGNP Avs printed and mailed.

## 2018-10-23 NOTE — Patient Instructions (Signed)
Blood sugar and cholesterol have worsened, encouraged to make diet modification and increase physical activity at this time  DASH Eating Plan DASH stands for "Dietary Approaches to Stop Hypertension." The DASH eating plan is a healthy eating plan that has been shown to reduce high blood pressure (hypertension). It may also reduce your risk for type 2 diabetes, heart disease, and stroke. The DASH eating plan may also help with weight loss. What are tips for following this plan?  General guidelines  Avoid eating more than 2,300 mg (milligrams) of salt (sodium) a day. If you have hypertension, you may need to reduce your sodium intake to 1,500 mg a day.  Limit alcohol intake to no more than 1 drink a day for nonpregnant women and 2 drinks a day for men. One drink equals 12 oz of beer, 5 oz of wine, or 1 oz of hard liquor.  Work with your health care provider to maintain a healthy body weight or to lose weight. Ask what an ideal weight is for you.  Get at least 30 minutes of exercise that causes your heart to beat faster (aerobic exercise) most days of the week. Activities may include walking, swimming, or biking.  Work with your health care provider or diet and nutrition specialist (dietitian) to adjust your eating plan to your individual calorie needs. Reading food labels   Check food labels for the amount of sodium per serving. Choose foods with less than 5 percent of the Daily Value of sodium. Generally, foods with less than 300 mg of sodium per serving fit into this eating plan.  To find whole grains, look for the word "whole" as the first word in the ingredient list. Shopping  Buy products labeled as "low-sodium" or "no salt added."  Buy fresh foods. Avoid canned foods and premade or frozen meals. Cooking  Avoid adding salt when cooking. Use salt-free seasonings or herbs instead of table salt or sea salt. Check with your health care provider or pharmacist before using salt  substitutes.  Do not fry foods. Cook foods using healthy methods such as baking, boiling, grilling, and broiling instead.  Cook with heart-healthy oils, such as olive, canola, soybean, or sunflower oil. Meal planning  Eat a balanced diet that includes: ? 5 or more servings of fruits and vegetables each day. At each meal, try to fill half of your plate with fruits and vegetables. ? Up to 6-8 servings of whole grains each day. ? Less than 6 oz of lean meat, poultry, or fish each day. A 3-oz serving of meat is about the same size as a deck of cards. One egg equals 1 oz. ? 2 servings of low-fat dairy each day. ? A serving of nuts, seeds, or beans 5 times each week. ? Heart-healthy fats. Healthy fats called Omega-3 fatty acids are found in foods such as flaxseeds and coldwater fish, like sardines, salmon, and mackerel.  Limit how much you eat of the following: ? Canned or prepackaged foods. ? Food that is high in trans fat, such as fried foods. ? Food that is high in saturated fat, such as fatty meat. ? Sweets, desserts, sugary drinks, and other foods with added sugar. ? Full-fat dairy products.  Do not salt foods before eating.  Try to eat at least 2 vegetarian meals each week.  Eat more home-cooked food and less restaurant, buffet, and fast food.  When eating at a restaurant, ask that your food be prepared with less salt or no salt, if  possible. What foods are recommended? The items listed may not be a complete list. Talk with your dietitian about what dietary choices are best for you. Grains Whole-grain or whole-wheat bread. Whole-grain or whole-wheat pasta. Brown rice. Modena Morrow. Bulgur. Whole-grain and low-sodium cereals. Pita bread. Low-fat, low-sodium crackers. Whole-wheat flour tortillas. Vegetables Fresh or frozen vegetables (raw, steamed, roasted, or grilled). Low-sodium or reduced-sodium tomato and vegetable juice. Low-sodium or reduced-sodium tomato sauce and tomato  paste. Low-sodium or reduced-sodium canned vegetables. Fruits All fresh, dried, or frozen fruit. Canned fruit in natural juice (without added sugar). Meat and other protein foods Skinless chicken or [place]. Ground chicken or [place]. Pork with fat trimmed off. Fish and seafood. Egg whites. Dried beans, peas, or lentils. Unsalted nuts, nut butters, and seeds. Unsalted canned beans. Lean cuts of beef with fat trimmed off. Low-sodium, lean deli meat. Dairy Low-fat (1%) or fat-free (skim) milk. Fat-free, low-fat, or reduced-fat cheeses. Nonfat, low-sodium ricotta or cottage cheese. Low-fat or nonfat yogurt. Low-fat, low-sodium cheese. Fats and oils Soft margarine without trans fats. Vegetable oil. Low-fat, reduced-fat, or light mayonnaise and salad dressings (reduced-sodium). Canola, safflower, olive, soybean, and sunflower oils. Avocado. Seasoning and other foods Herbs. Spices. Seasoning mixes without salt. Unsalted popcorn and pretzels. Fat-free sweets. What foods are not recommended? The items listed may not be a complete list. Talk with your dietitian about what dietary choices are best for you. Grains Baked goods made with fat, such as croissants, muffins, or some breads. Dry pasta or rice meal packs. Vegetables Creamed or fried vegetables. Vegetables in a cheese sauce. Regular canned vegetables (not low-sodium or reduced-sodium). Regular canned tomato sauce and paste (not low-sodium or reduced-sodium). Regular tomato and vegetable juice (not low-sodium or reduced-sodium). Angie Fava. Olives. Fruits Canned fruit in a light or heavy syrup. Fried fruit. Fruit in cream or butter sauce. Meat and other protein foods Fatty cuts of meat. Ribs. Fried meat. Berniece Salines. Sausage. Bologna and other processed lunch meats. Salami. Fatback. Hotdogs. Bratwurst. Salted nuts and seeds. Canned beans with added salt. Canned or smoked fish. Whole eggs or egg yolks. Chicken or [place] with skin. Dairy Whole or 2% milk,  cream, and half-and-half. Whole or full-fat cream cheese. Whole-fat or sweetened yogurt. Full-fat cheese. Nondairy creamers. Whipped toppings. Processed cheese and cheese spreads. Fats and oils Butter. Stick margarine. Lard. Shortening. Ghee. Bacon fat. Tropical oils, such as coconut, palm kernel, or palm oil. Seasoning and other foods Salted popcorn and pretzels. Onion salt, garlic salt, seasoned salt, table salt, and sea salt. Worcestershire sauce. Tartar sauce. Barbecue sauce. Teriyaki sauce. Soy sauce, including reduced-sodium. Steak sauce. Canned and packaged gravies. Fish sauce. Oyster sauce. Cocktail sauce. Horseradish that you find on the shelf. Ketchup. Mustard. Meat flavorings and tenderizers. Bouillon cubes. Hot sauce and Tabasco sauce. Premade or packaged marinades. Premade or packaged taco seasonings. Relishes. Regular salad dressings. Where to find more information:  National Heart, Lung, and [place]: https://wilson-eaton.com/  American Heart Association: www.heart.org Summary  The DASH eating plan is a healthy eating plan that has been shown to reduce high blood pressure (hypertension). It may also reduce your risk for type 2 diabetes, heart disease, and stroke.  With the DASH eating plan, you should limit salt (sodium) intake to 2,300 mg a day. If you have hypertension, you may need to reduce your sodium intake to 1,500 mg a day.  When on the DASH eating plan, aim to eat more fresh fruits and vegetables, whole grains, lean proteins, low-fat dairy, and heart-healthy fats.  Work with your health care provider or diet and nutrition specialist (dietitian) to adjust your eating plan to your individual calorie needs. This information is not intended to replace advice given to you by your health care provider. Make sure you discuss any questions you have with your health care provider. Document Released: 04/14/2011 Document Revised: 04/18/2016 Document Reviewed: 04/18/2016 Elsevier  Interactive Patient Education  2019 [employer].

## 2018-11-08 ENCOUNTER — Other Ambulatory Visit: Payer: Self-pay | Admitting: Nurse Practitioner

## 2018-11-29 ENCOUNTER — Other Ambulatory Visit: Payer: Self-pay | Admitting: Nurse Practitioner

## 2018-12-21 ENCOUNTER — Other Ambulatory Visit: Payer: Self-pay | Admitting: Nurse Practitioner

## 2018-12-21 DIAGNOSIS — Z1231 Encounter for screening mammogram for malignant neoplasm of breast: Secondary | ICD-10-CM

## 2018-12-25 ENCOUNTER — Ambulatory Visit
Admission: RE | Admit: 2018-12-25 | Discharge: 2018-12-25 | Disposition: A | Discharge status: Home / Self Care | Payer: Medicare Other | Source: Ambulatory Visit

## 2018-12-25 ENCOUNTER — Other Ambulatory Visit: Payer: Self-pay

## 2018-12-25 DIAGNOSIS — Z1231 Encounter for screening mammogram for malignant neoplasm of breast: Secondary | ICD-10-CM

## 2019-01-03 ENCOUNTER — Other Ambulatory Visit: Payer: Self-pay | Admitting: Nurse Practitioner

## 2019-01-03 DIAGNOSIS — L821 Other seborrheic keratosis: Secondary | ICD-10-CM | POA: Diagnosis not present

## 2019-01-03 DIAGNOSIS — D225 Melanocytic nevi of trunk: Secondary | ICD-10-CM | POA: Diagnosis not present

## 2019-01-03 DIAGNOSIS — E119 Type 2 diabetes mellitus without complications: Secondary | ICD-10-CM

## 2019-01-03 DIAGNOSIS — D1801 Hemangioma of skin and subcutaneous tissue: Secondary | ICD-10-CM | POA: Diagnosis not present

## 2019-01-03 DIAGNOSIS — Z85820 Personal history of malignant melanoma of skin: Secondary | ICD-10-CM | POA: Diagnosis not present

## 2019-01-08 NOTE — Progress Notes (Signed)
Cardiology Office Note   Date:  01/10/2019   ID:  Jill Garza, Jill Garza 1950-09-27, MRN [id number]  PCP:  Lauree Chandler, NP  Cardiologist:   No primary care provider on file.   Chief Complaint  Patient presents with  . Palpitations      History of Present Illness: Jill Garza is a 68 y.o. female who presents for evaluation of palpitations and chest discomfort.  She has had a history of PVCs.  There was some vague chest pain and POET (Plain Old Exercise Treadmill) was negative.    At the last visit she was still having palpitations and was going to use an Alive Cor.    She continues to get palpitations.  This happens sporadically.  She had about 4 or 5 days very recently for she notices palpitations.  She has recorded them on her device and she has PACs.  There have been no sustained arrhythmias.  She is unable to bring on symptoms.  She does not associated with activities or food.  She has not had any presyncope or syncope but they make her uncomfortable.  They last for brief seconds and correlate with the PACs.  She did do an exercise treadmill test last year and she had some PVCs at peak exercise.   Past Medical History:  Diagnosis Date  . Arthritis   . Cancer ([place])    melanoma- right leg  . Depression   . GERD (gastroesophageal reflux disease)   . History of hiatal hernia   . Hypercholesterolemia   . Hypertension   . Sweat, sweating, excessive   . Type 2 diabetes mellitus ([place])     Past Surgical History:  Procedure Laterality Date  . ABDOMINAL HYSTERECTOMY  1979  . APPENDECTOMY  1964  . BREAST EXCISIONAL BIOPSY Left    benign  . BREAST LUMPECTOMY WITH RADIOACTIVE SEED LOCALIZATION Left 12/31/2014   Procedure: LEFT BREAST LUMPECTOMY WITH RADIOACTIVE SEED LOCALIZATION;  Surgeon: Donnie Mesa, MD;  Location: [place];  Service: General;  Laterality: Left;  . CARPAL TUNNEL RELEASE Right 1977  . CARPAL TUNNEL RELEASE Left 2007  . CATARACT  EXTRACTION Left 04/13/2014   Dr. Herbert Deaner  . CATARACT EXTRACTION Right 06/10/2014   Dr. Herbert Deaner  . CHOLECYSTECTOMY N/A 07/14/2017   Procedure: LAPAROSCOPIC CHOLECYSTECTOMY;  Surgeon: Ileana Roup, MD;  Location: [place];  Service: General;  Laterality: N/A;  . COLONOSCOPY  2009  . JOINT REPLACEMENT    . MELANOMA EXCISION    . TONSILLECTOMY  1962  . TOTAL KNEE ARTHROPLASTY Left 2012  . TOTAL KNEE ARTHROPLASTY Right 2008     Current Outpatient Medications  Medication Sig Dispense Refill  . fenofibrate (TRICOR) 145 MG tablet Take 1 tablet by mouth once daily 90 tablet 1  . glipiZIDE (GLUCOTROL) 5 MG tablet TAKE 1 TABLET BY MOUTH TWICE DAILY BEFORE MEAL(S) 180 tablet 1  . losartan-hydrochlorothiazide (HYZAAR) 100-12.5 MG tablet Take 1 tablet by mouth daily. 90 tablet 1  . omeprazole (PRILOSEC) 20 MG capsule Take 1 capsule by mouth once daily 90 capsule 3  . PARoxetine (PAXIL) 30 MG tablet Take 1 tablet by mouth once daily 90 tablet 0  . propranolol (INDERAL) 10 MG tablet Take 1 tablet (10 mg total) by mouth 3 (three) times daily as needed. 90 tablet 0   No current facility-administered medications for this visit.     Allergies:   Penicillins, Ivp dye [iodinated diagnostic agents], and Percocet [oxycodone-acetaminophen]  ROS:  Please see the history of present illness.   Otherwise, review of systems are positive for none.   All other systems are reviewed and negative.    PHYSICAL EXAM: VS:  BP 116/74 (BP Location: Left Arm, Patient Position: Sitting, Cuff Size: Normal)   Pulse 93   Temp 97.7 F (36.5 C)   Ht 5\' 4"  (1.626 m)   Wt 232 lb (105.2 kg)   BMI 39.82 kg/m  , BMI Body mass index is 39.82 kg/m. GENERAL:  Well appearing NECK:  No jugular venous distention, waveform within normal limits, carotid upstroke brisk and symmetric, no bruits, no thyromegaly LUNGS:  Clear to auscultation bilaterally CHEST:  Unremarkable HEART:  PMI not displaced or sustained,S1 and S2 within  normal limits, no S3, no S4, no clicks, no rubs, no murmurs ABD:  Flat, positive bowel sounds normal in frequency in pitch, no bruits, no rebound, no guarding, no midline pulsatile mass, no hepatomegaly, no splenomegaly EXT:  2 plus pulses throughout, no edema, no cyanosis no clubbing   EKG:  EKG not ordered today.   Recent Labs: 10/19/2018: ALT 17; BUN 25; Creat 1.33; Hemoglobin 13.9; Platelets 292; Potassium 4.6; Sodium 138    Lipid Panel    Component Value Date/Time   CHOL 179 10/19/2018 0806   CHOL 177 09/02/2015 0846   TRIG 145 10/19/2018 0806   HDL 49 (L) 10/19/2018 0806   HDL 50 09/02/2015 0846   CHOLHDL 3.7 10/19/2018 0806   VLDL 33 (H) 10/19/2016 0828   LDLCALC 104 (H) 10/19/2018 0806      Wt Readings from Last 3 Encounters:  01/10/19 232 lb (105.2 kg)  04/19/18 215 lb (97.5 kg)  04/18/18 214 lb (97.1 kg)      Other studies Reviewed: Additional studies/ records that were reviewed today include: None Review of the above records demonstrates:  NA  ASSESSMENT AND PLAN:  CHEST PAIN:     Her pain is somewhat atypical.  It may be palpitations and when she is exercising.  I will have her take her propranolol before she exercises.  This is further described below.   PALPITATIONS:   She has palpitations as described.  She continue Prilosec 10 mg p.o. 3 times daily as needed for a pill in pocket approach and before she exercises.   HTN:  The blood pressure is at target.  No change in therapy.   DM: A1c was 6.5 which is up slightly.  This is managed by Lauree Chandler, NP  WEIGHT: Her weight is up slightly we talked about this specifically.  I gave her some specific about exercise.  DYSLIPIDEMIA:   LDL is slightly elevated but this is being managed medically.    Current medicines are reviewed at length with the patient today.  The patient does not have concerns regarding medicines.  The following changes have been made: As above Labs/ tests ordered today  include:  None  No orders of the defined types were placed in this encounter.    Disposition:   FU with me in six months.     Signed, Minus Breeding, MD  01/10/2019 12:49 PM    [facility]

## 2019-01-10 ENCOUNTER — Ambulatory Visit (INDEPENDENT_AMBULATORY_CARE_PROVIDER_SITE_OTHER): Payer: Medicare Other | Admitting: Cardiology

## 2019-01-10 ENCOUNTER — Other Ambulatory Visit: Payer: Self-pay

## 2019-01-10 ENCOUNTER — Encounter: Payer: Self-pay | Admitting: Cardiology

## 2019-01-10 VITALS — BP 116/74 | HR 93 | Temp 97.7°F | Ht 64.0 in | Wt 232.0 lb

## 2019-01-10 DIAGNOSIS — R002 Palpitations: Secondary | ICD-10-CM

## 2019-01-10 DIAGNOSIS — R079 Chest pain, unspecified: Secondary | ICD-10-CM | POA: Diagnosis not present

## 2019-01-10 DIAGNOSIS — E785 Hyperlipidemia, unspecified: Secondary | ICD-10-CM | POA: Diagnosis not present

## 2019-01-10 DIAGNOSIS — E119 Type 2 diabetes mellitus without complications: Secondary | ICD-10-CM

## 2019-01-10 DIAGNOSIS — I10 Essential (primary) hypertension: Secondary | ICD-10-CM | POA: Diagnosis not present

## 2019-01-10 MED ORDER — PROPRANOLOL HCL 10 MG PO TABS: 10.0000 mg | ORAL_TABLET | Freq: Three times a day (TID) | ORAL | 0 refills | Status: DC | PRN

## 2019-01-10 NOTE — Patient Instructions (Signed)
Medication Instructions:  Start taking Propranolol 10mg  by mouth 3 times as day as needed.  Lab work: NONE If you have labs (blood work) drawn today and your tests are completely normal, you will receive your results only by: [place] (if you have MyChart) OR A paper copy in the mail If you have any lab test that is abnormal or we need to change your treatment, we will call you to review the results.  Testing/Procedures: NONE  Follow-Up: At [facility], you and your health needs are our priority.  As part of our continuing mission to provide you with exceptional heart care, we have created designated Provider Care Teams.  These Care Teams include your primary Cardiologist (physician) and Advanced Practice Providers (APPs -  Physician Assistants and Nurse Practitioners) who all work together to provide you with the care you need, when you need it. You will need a follow up appointment in 6 months.  Please call our office 2 months in advance to schedule this appointment.  You may see Minus Breeding, MD or one of the following Advanced Practice Providers on your designated Care Team:   Rosaria Ferries, PA-C Jory Sims, DNP, ANP

## 2019-02-05 ENCOUNTER — Other Ambulatory Visit: Payer: Self-pay

## 2019-02-05 ENCOUNTER — Ambulatory Visit (INDEPENDENT_AMBULATORY_CARE_PROVIDER_SITE_OTHER): Payer: Medicare Other

## 2019-02-05 DIAGNOSIS — Z23 Encounter for immunization: Secondary | ICD-10-CM | POA: Diagnosis not present

## 2019-02-27 ENCOUNTER — Other Ambulatory Visit: Payer: Self-pay | Admitting: Nurse Practitioner

## 2019-04-17 ENCOUNTER — Other Ambulatory Visit: Payer: Medicare Other

## 2019-04-17 ENCOUNTER — Other Ambulatory Visit: Payer: Self-pay

## 2019-04-17 DIAGNOSIS — E785 Hyperlipidemia, unspecified: Secondary | ICD-10-CM

## 2019-04-17 DIAGNOSIS — I10 Essential (primary) hypertension: Secondary | ICD-10-CM

## 2019-04-17 DIAGNOSIS — E119 Type 2 diabetes mellitus without complications: Secondary | ICD-10-CM

## 2019-04-17 DIAGNOSIS — E1122 Type 2 diabetes mellitus with diabetic chronic kidney disease: Secondary | ICD-10-CM

## 2019-04-18 LAB — CBC WITH DIFFERENTIAL/PLATELET
Absolute Monocytes: 308 cells/uL (ref 200–950)
Basophils Absolute: 51 cells/uL (ref 0–200)
Basophils Relative: 0.9 %
Eosinophils Absolute: 80 cells/uL (ref 15–500)
Eosinophils Relative: 1.4 %
HCT: 43.1 % (ref 35.0–45.0)
Hemoglobin: 14.2 g/dL (ref 11.7–15.5)
Lymphs Abs: 2195 cells/uL (ref 850–3900)
MCH: 29 pg (ref 27.0–33.0)
MCHC: 32.9 g/dL (ref 32.0–36.0)
MCV: 88 fL (ref 80.0–100.0)
MPV: 10.2 fL (ref 7.5–12.5)
Monocytes Relative: 5.4 %
Neutro Abs: 3067 cells/uL (ref 1500–7800)
Neutrophils Relative %: 53.8 %
Platelets: 274 10*3/uL (ref 140–400)
RBC: 4.9 10*6/uL (ref 3.80–5.10)
RDW: 12.5 % (ref 11.0–15.0)
Total Lymphocyte: 38.5 %
WBC: 5.7 10*3/uL (ref 3.8–10.8)

## 2019-04-18 LAB — COMPLETE METABOLIC PANEL WITH GFR
AG Ratio: 1.6 (calc) (ref 1.0–2.5)
ALT: 21 U/L (ref 6–29)
AST: 20 U/L (ref 10–35)
Albumin: 4.4 g/dL (ref 3.6–5.1)
Alkaline phosphatase (APISO): 40 U/L (ref 37–153)
BUN/Creatinine Ratio: 17 (calc) (ref 6–22)
BUN: 21 mg/dL (ref 7–25)
CO2: 24 mmol/L (ref 20–32)
Calcium: 10.7 mg/dL — ABNORMAL HIGH (ref 8.6–10.4)
Chloride: 103 mmol/L (ref 98–110)
Creat: 1.27 mg/dL — ABNORMAL HIGH (ref 0.60–0.93)
GFR, Est African American: 50 mL/min/{1.73_m2} — ABNORMAL LOW (ref >=60)
GFR, Est Non African American: 43 mL/min/{1.73_m2} — ABNORMAL LOW (ref >=60)
Globulin: 2.8 g/dL (calc) (ref 1.9–3.7)
Glucose, Bld: 129 mg/dL — ABNORMAL HIGH (ref 65–99)
Potassium: 4.4 mmol/L (ref 3.5–5.3)
Sodium: 137 mmol/L (ref 135–146)
Total Bilirubin: 0.4 mg/dL (ref 0.2–1.2)
Total Protein: 7.2 g/dL (ref 6.1–8.1)

## 2019-04-18 LAB — HEMOGLOBIN A1C
Hgb A1c MFr Bld: 6.6 % of total Hgb — ABNORMAL HIGH (ref <5.7)
Mean Plasma Glucose: 143 (calc)
eAG (mmol/L): 7.9 (calc)

## 2019-04-18 LAB — CALCIUM, IONIZED: Calcium, Ion: 5.61 mg/dL — ABNORMAL HIGH (ref 4.8–5.6)

## 2019-04-18 LAB — LIPID PANEL
Cholesterol: 149 mg/dL (ref <200)
HDL: 41 mg/dL — ABNORMAL LOW (ref >=50)
LDL Cholesterol (Calc): 84 mg/dL (calc)
Non-HDL Cholesterol (Calc): 108 mg/dL (calc) (ref <130)
Total CHOL/HDL Ratio: 3.6 (calc) (ref <5.0)
Triglycerides: 137 mg/dL (ref <150)

## 2019-04-18 LAB — PTH, INTACT AND CALCIUM
Calcium: 10.7 mg/dL — ABNORMAL HIGH (ref 8.6–10.4)
PTH: 85 pg/mL — ABNORMAL HIGH (ref 14–64)

## 2019-04-22 ENCOUNTER — Encounter: Payer: Self-pay | Admitting: Nurse Practitioner

## 2019-04-22 ENCOUNTER — Ambulatory Visit (INDEPENDENT_AMBULATORY_CARE_PROVIDER_SITE_OTHER): Payer: Medicare Other | Admitting: Nurse Practitioner

## 2019-04-22 ENCOUNTER — Ambulatory Visit: Payer: Self-pay

## 2019-04-22 ENCOUNTER — Other Ambulatory Visit: Payer: Self-pay

## 2019-04-22 VITALS — BP 110/64 | HR 83 | Temp 96.9°F | Ht 64.0 in | Wt 229.0 lb

## 2019-04-22 VITALS — BP 110/64 | HR 83 | Temp 96.9°F | Ht 64.0 in | Wt 229.8 lb

## 2019-04-22 DIAGNOSIS — R002 Palpitations: Secondary | ICD-10-CM | POA: Diagnosis not present

## 2019-04-22 DIAGNOSIS — E785 Hyperlipidemia, unspecified: Secondary | ICD-10-CM | POA: Diagnosis not present

## 2019-04-22 DIAGNOSIS — E1140 Type 2 diabetes mellitus with diabetic neuropathy, unspecified: Secondary | ICD-10-CM

## 2019-04-22 DIAGNOSIS — E210 Primary hyperparathyroidism: Secondary | ICD-10-CM

## 2019-04-22 DIAGNOSIS — R42 Dizziness and giddiness: Secondary | ICD-10-CM | POA: Diagnosis not present

## 2019-04-22 DIAGNOSIS — E2839 Other primary ovarian failure: Secondary | ICD-10-CM

## 2019-04-22 DIAGNOSIS — Z0000 Encounter for general adult medical examination without abnormal findings: Secondary | ICD-10-CM

## 2019-04-22 DIAGNOSIS — E669 Obesity, unspecified: Secondary | ICD-10-CM

## 2019-04-22 DIAGNOSIS — I10 Essential (primary) hypertension: Secondary | ICD-10-CM

## 2019-04-22 DIAGNOSIS — J302 Other seasonal allergic rhinitis: Secondary | ICD-10-CM

## 2019-04-22 NOTE — Patient Instructions (Addendum)
To use saline wash to sinuses daily To use Claritin or zyrtec 10 mg daily (can use generic)  Also can use flonase 1 spray into both nares daily (this is OTC or can get prescription)  Continue to work on diet and exercise modifications.    DASH Eating Plan DASH stands for "Dietary Approaches to Stop Hypertension." The DASH eating plan is a healthy eating plan that has been shown to reduce high blood pressure (hypertension). It may also reduce your risk for type 2 diabetes, heart disease, and stroke. The DASH eating plan may also help with weight loss. What are tips for following this plan?  General guidelines  Avoid eating more than 2,300 mg (milligrams) of salt (sodium) a day. If you have hypertension, you may need to reduce your sodium intake to 1,500 mg a day.  Limit alcohol intake to no more than 1 drink a day for nonpregnant women and 2 drinks a day for men. One drink equals 12 oz of beer, 5 oz of wine, or 1 oz of hard liquor.  Work with your health care provider to maintain a healthy body weight or to lose weight. Ask what an ideal weight is for you.  Get at least 30 minutes of exercise that causes your heart to beat faster (aerobic exercise) most days of the week. Activities may include walking, swimming, or biking.  Work with your health care provider or diet and nutrition specialist (dietitian) to adjust your eating plan to your individual calorie needs. Reading food labels   Check food labels for the amount of sodium per serving. Choose foods with less than 5 percent of the Daily Value of sodium. Generally, foods with less than 300 mg of sodium per serving fit into this eating plan.  To find whole grains, look for the word "whole" as the first word in the ingredient list. Shopping  Buy products labeled as "low-sodium" or "no salt added."  Buy fresh foods. Avoid canned foods and premade or frozen meals. Cooking  Avoid adding salt when cooking. Use salt-free seasonings or  herbs instead of table salt or sea salt. Check with your health care provider or pharmacist before using salt substitutes.  Do not fry foods. Cook foods using healthy methods such as baking, boiling, grilling, and broiling instead.  Cook with heart-healthy oils, such as olive, canola, soybean, or sunflower oil. Meal planning  Eat a balanced diet that includes: ? 5 or more servings of fruits and vegetables each day. At each meal, try to fill half of your plate with fruits and vegetables. ? Up to 6-8 servings of whole grains each day. ? Less than 6 oz of lean meat, poultry, or fish each day. A 3-oz serving of meat is about the same size as a deck of cards. One egg equals 1 oz. ? 2 servings of low-fat dairy each day. ? A serving of nuts, seeds, or beans 5 times each week. ? Heart-healthy fats. Healthy fats called Omega-3 fatty acids are found in foods such as flaxseeds and coldwater fish, like sardines, salmon, and mackerel.  Limit how much you eat of the following: ? Canned or prepackaged foods. ? Food that is high in trans fat, such as fried foods. ? Food that is high in saturated fat, such as fatty meat. ? Sweets, desserts, sugary drinks, and other foods with added sugar. ? Full-fat dairy products.  Do not salt foods before eating.  Try to eat at least 2 vegetarian meals each week.  Eat  more home-cooked food and less restaurant, buffet, and fast food.  When eating at a restaurant, ask that your food be prepared with less salt or no salt, if possible. What foods are recommended? The items listed may not be a complete list. Talk with your dietitian about what dietary choices are best for you. Grains Whole-grain or whole-wheat bread. Whole-grain or whole-wheat pasta. Brown rice. [place]row. Bulgur. Whole-grain and low-sodium cereals. Pita bread. Low-fat, low-sodium crackers. Whole-wheat flour tortillas. Vegetables Fresh or frozen vegetables (raw, steamed, roasted, or grilled).  Low-sodium or reduced-sodium tomato and vegetable juice. Low-sodium or reduced-sodium tomato sauce and tomato paste. Low-sodium or reduced-sodium canned vegetables. Fruits All fresh, dried, or frozen fruit. Canned fruit in natural juice (without added sugar). Meat and other protein foods Skinless chicken or [place]. Ground chicken or [place]. Pork with fat trimmed off. Fish and seafood. Egg whites. Dried beans, peas, or lentils. Unsalted nuts, nut butters, and seeds. Unsalted canned beans. Lean cuts of beef with fat trimmed off. Low-sodium, lean deli meat. Dairy Low-fat (1%) or fat-free (skim) milk. Fat-free, low-fat, or reduced-fat cheeses. Nonfat, low-sodium ricotta or cottage cheese. Low-fat or nonfat yogurt. Low-fat, low-sodium cheese. Fats and oils Soft margarine without trans fats. Vegetable oil. Low-fat, reduced-fat, or light mayonnaise and salad dressings (reduced-sodium). Canola, safflower, olive, soybean, and sunflower oils. Avocado. Seasoning and other foods Herbs. Spices. Seasoning mixes without salt. Unsalted popcorn and pretzels. Fat-free sweets. What foods are not recommended? The items listed may not be a complete list. Talk with your dietitian about what dietary choices are best for you. Grains Baked goods made with fat, such as croissants, muffins, or some breads. Dry pasta or rice meal packs. Vegetables Creamed or fried vegetables. Vegetables in a cheese sauce. Regular canned vegetables (not low-sodium or reduced-sodium). Regular canned tomato sauce and paste (not low-sodium or reduced-sodium). Regular tomato and vegetable juice (not low-sodium or reduced-sodium). Angie Fava. Olives. Fruits Canned fruit in a light or heavy syrup. Fried fruit. Fruit in cream or butter sauce. Meat and other protein foods Fatty cuts of meat. Ribs. Fried meat. Berniece Salines. Sausage. Bologna and other processed lunch meats. Salami. Fatback. Hotdogs. Bratwurst. Salted nuts and seeds. Canned beans with added  salt. Canned or smoked fish. Whole eggs or egg yolks. Chicken or [place] with skin. Dairy Whole or 2% milk, cream, and half-and-half. Whole or full-fat cream cheese. Whole-fat or sweetened yogurt. Full-fat cheese. Nondairy creamers. Whipped toppings. Processed cheese and cheese spreads. Fats and oils Butter. Stick margarine. Lard. Shortening. Ghee. Bacon fat. Tropical oils, such as coconut, palm kernel, or palm oil. Seasoning and other foods Salted popcorn and pretzels. Onion salt, garlic salt, seasoned salt, table salt, and sea salt. Worcestershire sauce. Tartar sauce. Barbecue sauce. Teriyaki sauce. Soy sauce, including reduced-sodium. Steak sauce. Canned and packaged gravies. Fish sauce. Oyster sauce. Cocktail sauce. Horseradish that you find on the shelf. Ketchup. Mustard. Meat flavorings and tenderizers. Bouillon cubes. Hot sauce and Tabasco sauce. Premade or packaged marinades. Premade or packaged taco seasonings. Relishes. Regular salad dressings. Where to find more information:  National Heart, Lung, and [place]: https://wilson-eaton.com/  American Heart Association: www.heart.org Summary  The DASH eating plan is a healthy eating plan that has been shown to reduce high blood pressure (hypertension). It may also reduce your risk for type 2 diabetes, heart disease, and stroke.  With the DASH eating plan, you should limit salt (sodium) intake to 2,300 mg a day. If you have hypertension, you may need to reduce your sodium intake to  1,500 mg a day.  When on the DASH eating plan, aim to eat more fresh fruits and vegetables, whole grains, lean proteins, low-fat dairy, and heart-healthy fats.  Work with your health care provider or diet and nutrition specialist (dietitian) to adjust your eating plan to your individual calorie needs. This information is not intended to replace advice given to you by your health care provider. Make sure you discuss any questions you have with your health care  provider. Document Released: 04/14/2011 Document Revised: 04/07/2017 Document Reviewed: 04/18/2016 Elsevier Patient Education  2020 [employer].

## 2019-04-22 NOTE — Progress Notes (Signed)
Careteam: Patient Care Team: Lauree Chandler, NP as PCP - General (Geriatric Medicine) Alanda Slim Neena Rhymes, MD as Consulting Physician (Ophthalmology)  Advanced Directive information    Allergies  Allergen Reactions  . Penicillins     Rash   . Ivp Dye [Iodinated Diagnostic Agents]     Hives   . Percocet [Oxycodone-Acetaminophen]     Itching     Chief Complaint  Patient presents with  . Medical Management of Chronic Issues    6 month follow-up, discuss labs (copy printed)   . Dizziness    Ongoing concern with dizziness   . Numbness    Warm feeling, numbness, and tingling on souls of vaccine   . Quality Metric Gaps    Foot exam due      HPI: Patient is a 68 y.o. female seen in the office today for routine follow up  Neuropathy- numbness and tingling on the souls of her feet. Uncomfortable later in the day and at night. Once she goes to sleep it is okay burning when she is watching TV.   DM- needs to cut back on the "white" foods. No hypoglycemia.   palpitations have PAC and PVC- cardiologist gave her propranolol to use as needed but they have settled down.   Dizziness- Ears are very full, left it the worse- hearing is muffled. Usually happens when she gets up fast or turns her head fast.  Left side of her sinus is generally full.  Does not want hearing evaluated because it is not that severe.   Anxiety/depression- stable on paxil.   htn- controlled on losartan/hctz  Hyperlipidemia- improved, continues to work on diet LDL 86  Review of Systems:  Review of Systems  Constitutional: Negative for chills, fever and weight loss.  HENT: Positive for hearing loss. Negative for tinnitus.   Respiratory: Negative for cough, sputum production and shortness of breath.   Cardiovascular: Positive for palpitations (improved at this time). Negative for chest pain and leg swelling.  Gastrointestinal: Negative for abdominal pain, constipation, diarrhea and heartburn.    Genitourinary: Negative for dysuria, frequency and urgency.  Musculoskeletal: Positive for back pain and myalgias (muscle cramping). Negative for falls and joint pain.  Skin: Negative.   Neurological: Positive for dizziness, tingling and sensory change. Negative for headaches.  Psychiatric/Behavioral: Negative for depression and memory loss. The patient does not have insomnia.     Past Medical History:  Diagnosis Date  . Arthritis   . Cancer ([place])    melanoma- right leg  . Depression   . GERD (gastroesophageal reflux disease)   . History of hiatal hernia   . Hypercholesterolemia   . Hypertension   . Sweat, sweating, excessive   . Type 2 diabetes mellitus ([place])    Past Surgical History:  Procedure Laterality Date  . ABDOMINAL HYSTERECTOMY  1979  . APPENDECTOMY  1964  . BREAST EXCISIONAL BIOPSY Left    benign  . BREAST LUMPECTOMY WITH RADIOACTIVE SEED LOCALIZATION Left 12/31/2014   Procedure: LEFT BREAST LUMPECTOMY WITH RADIOACTIVE SEED LOCALIZATION;  Surgeon: Donnie Mesa, MD;  Location: [place];  Service: General;  Laterality: Left;  . CARPAL TUNNEL RELEASE Right 1977  . CARPAL TUNNEL RELEASE Left 2007  . CATARACT EXTRACTION Left 04/13/2014   Dr. Herbert Deaner  . CATARACT EXTRACTION Right 06/10/2014   Dr. Herbert Deaner  . CHOLECYSTECTOMY N/A 07/14/2017   Procedure: LAPAROSCOPIC CHOLECYSTECTOMY;  Surgeon: Ileana Roup, MD;  Location: [facility];  Service: General;  Laterality: N/A;  .  COLONOSCOPY  2009  . JOINT REPLACEMENT    . MELANOMA EXCISION    . TONSILLECTOMY  1962  . TOTAL KNEE ARTHROPLASTY Left 2012  . TOTAL KNEE ARTHROPLASTY Right 2008   Social History:   reports that she quit smoking about 32 years ago. Her smoking use included cigarettes. She quit after 15.00 years of use. She has never used smokeless tobacco. She reports current alcohol use. She reports that she does not use drugs.  Family History  Problem Relation Age of Onset  . Heart attack Father         Died of MI age 93  . Pulmonary embolism Mother        Multiple, B/L   . Hypertension Brother   . Heart disease Brother 50  . Diabetes Brother   . High Cholesterol Brother   . Hypertension Brother   . High Cholesterol Brother   . Hypertension Sister   . Diabetes type II Sister   . High Cholesterol Sister   . Alcohol abuse Brother   . Reye's syndrome Daughter   . Hepatitis Brother   . Stroke Other   . Cancer Maternal Grandmother 43  . Colon cancer Neg Hx   . Esophageal cancer Neg Hx   . Stomach cancer Neg Hx   . Rectal cancer Neg Hx     Medications: Patient's Medications  New Prescriptions   No medications on file  Previous Medications   CLINDAMYCIN (CLEOCIN) 300 MG CAPSULE    4 tablets prior to dental work   FENOFIBRATE (TRICOR) 145 MG TABLET    Take 1 tablet by mouth once daily   GLIPIZIDE (GLUCOTROL) 5 MG TABLET    TAKE 1 TABLET BY MOUTH TWICE DAILY BEFORE MEAL(S)   LOSARTAN-HYDROCHLOROTHIAZIDE (HYZAAR) 100-12.5 MG TABLET    Take 1 tablet by mouth daily.   OMEPRAZOLE (PRILOSEC) 20 MG CAPSULE    Take 1 capsule by mouth once daily   PAROXETINE (PAXIL) 30 MG TABLET    Take 1 tablet by mouth once daily   PROPRANOLOL (INDERAL) 10 MG TABLET    Take 1 tablet (10 mg total) by mouth 3 (three) times daily as needed.  Modified Medications   No medications on file  Discontinued Medications   No medications on file    Physical Exam:  Vitals:   04/22/19 0942  BP: 110/64  Pulse: 83  Temp: (!) 96.9 F (36.1 C)  TempSrc: Temporal  SpO2: 96%  Weight: 229 lb 12.8 oz (104.2 kg)  Height: _0  (1.626 m)   Body mass index is 39.45 kg/m. Wt Readings from Last 3 Encounters:  04/22/19 229 lb 12.8 oz (104.2 kg)  04/22/19 229 lb (103.9 kg)  01/10/19 232 lb (105.2 kg)    Physical Exam Constitutional:      General: She is not in acute distress.    Appearance: She is well-developed. She is not diaphoretic.  HENT:     Head: Normocephalic and atraumatic.     Right Ear:  Tympanic membrane and ear canal normal.     Left Ear: Tympanic membrane and ear canal normal.  Eyes:     Conjunctiva/sclera: Conjunctivae normal.     Pupils: Pupils are equal, round, and reactive to light.  Cardiovascular:     Rate and Rhythm: Normal rate and regular rhythm.     Pulses:          Dorsalis pedis pulses are 2+ on the right side and 2+ on the left side.  Posterior tibial pulses are 2+ on the right side and 2+ on the left side.     Heart sounds: Normal heart sounds.  Pulmonary:     Effort: Pulmonary effort is normal.     Breath sounds: Normal breath sounds.  Abdominal:     General: Bowel sounds are normal.     Palpations: Abdomen is soft.  Musculoskeletal:        General: No tenderness.     Cervical back: Normal range of motion and neck supple.  Feet:     Right foot:     Protective Sensation: 3 sites tested. 3 sites sensed.     Skin integrity: Skin integrity normal.     Toenail Condition: Right toenails are normal.     Left foot:     Protective Sensation: 3 sites tested. 3 sites sensed.     Skin integrity: Skin integrity normal.     Toenail Condition: Left toenails are normal.  Skin:    General: Skin is warm and dry.  Neurological:     Mental Status: She is alert and oriented to person, place, and time.     Labs reviewed: Basic Metabolic Panel: Recent Labs    10/19/18 0806 04/17/19 0856  NA 138 137  K 4.6 4.4  CL 102 103  CO2 27 24  GLUCOSE 142* 129*  BUN 25 21  CREATININE 1.33* 1.27*  CALCIUM 11.0* 10.7*  10.7*   Liver Function Tests: Recent Labs    10/19/18 0806 04/17/19 0856  AST 19 20  ALT 17 21  BILITOT 0.4 0.4  PROT 7.0 7.2   No results for input(s): LIPASE, AMYLASE in the last 8760 hours. No results for input(s): AMMONIA in the last 8760 hours. CBC: Recent Labs    10/19/18 0806 04/17/19 0856  WBC 5.2 5.7  NEUTROABS 2,454 3,067  HGB 13.9 14.2  HCT 41.9 43.1  MCV 86.9 88.0  PLT 292 274   Lipid Panel: Recent Labs     10/19/18 0806 04/17/19 0856  CHOL 179 149  HDL 49* 41*  LDLCALC 104* 84  TRIG 145 137  CHOLHDL 3.7 3.6   TSH: No results for input(s): TSH in the last 8760 hours. A1C: Lab Results  Component Value Date   HGBA1C 6.6 (H) 04/17/2019     Assessment/Plan 1. Primary hyperparathyroidism (HCC) PTH has been trending up with elevated calcium. Pt reports cramping to legs which could be related. She is on no calcium supplement. - Ambulatory referral to Endocrinology for further evaluation.   2. Dizziness When moving her head certain ways. No other neuro  symptoms or deficit. Reports lots of fullness to left ear and left sided sinus congestion. To use sinus rinse and antihistamine to see if this improves symptom. To notify if symptoms worsen or fail to improve.  3. Palpitations -improved at this time. Has propranolol to use if needed  4. Dyslipidemia Improved, Encouraged ongoing dietary compliance to get LDL to goal <70. Continues on fenofibrate 145 mg daily  - Lipid Panel; Future - CMP with eGFR([facility]); Future  5. Obesity (BMI 30-39.9) Discussed today, encouraged dietary modifications and to increase physical activity/exericse  6. Other seasonal allergic rhinitis Will try nasal wash/nettipot and Claritin or zyrtec 10 mg daily. Also can use flonase 1 spray into both nares daily  7. Essential hypertension, benign Well controlled on losartan-hctz daily   8. Type 2 diabetes mellitus with diabetic neuropathy, without long-term current use of insulin (HCC) -continues on glipizide with dietary modifications,  a1c controlled at this time. - Hemoglobin A1c; Future - CBC with Differential/Platelet; Future  Next appt: 6 months, sooner if needed Melani Brisbane K. [place], [facility] [phone]

## 2019-04-22 NOTE — Progress Notes (Signed)
Subjective:   Jill Garza is a 68 y.o. female who presents for Medicare Annual (Subsequent) preventive examination.  Review of Systems:   Cardiac Risk Factors include: advanced age (>66men, >68 women);obesity (BMI >30kg/m2);sedentary lifestyle;family history of premature cardiovascular disease;diabetes mellitus;dyslipidemia     Objective:     Vitals: BP 110/64 (BP Location: Right Arm, Patient Position: Sitting, Cuff Size: Large)   Pulse 83   Temp (!) 96.9 F (36.1 C) (Temporal)   Ht 5\' 4"  (1.626 m)   Wt 229 lb (103.9 kg)   SpO2 96%   BMI 39.31 kg/m   Body mass index is 39.31 kg/m.  Advanced Directives 04/22/2019 10/23/2018 04/18/2018 04/18/2018 07/14/2017 07/14/2017 04/13/2017  Does Patient Have a Medical Advance Directive? Yes No No No - No No;Yes  Type of Paramedic of [place];Living will - - - - - -  Does patient want to make changes to medical advance directive? No - Patient declined - - - - - Yes (MAU/Ambulatory/Procedural Areas - Information given)  Copy of [place] in Chart? No - copy requested - - - - - -  Would patient like information on creating a medical advance directive? - No - Patient declined - Yes (MAU/Ambulatory/Procedural Areas - Information given) No - Patient declined - -    Tobacco Social History   Tobacco Use  Smoking Status Former Smoker  . Years: 15.00  . Types: Cigarettes  . Quit date: 05/09/1986  . Years since quitting: 32.9  Smokeless Tobacco Never Used     Counseling given: Not Answered   Clinical Intake:  Pre-visit preparation completed: Yes  Pain : No/denies pain     BMI - recorded: 39.31 Nutritional Status: BMI > 30  Obese Diabetes: Yes  How often do you need to have someone help you when you read instructions, pamphlets, or other written materials from your doctor or pharmacy?: 1 - Never What is the last grade level you completed in school?: master in nursing  Interpreter Needed?:  No     Past Medical History:  Diagnosis Date  . Arthritis   . Cancer ([place])    melanoma- right leg  . Depression   . GERD (gastroesophageal reflux disease)   . History of hiatal hernia   . Hypercholesterolemia   . Hypertension   . Sweat, sweating, excessive   . Type 2 diabetes mellitus ([place])    Past Surgical History:  Procedure Laterality Date  . ABDOMINAL HYSTERECTOMY  1979  . APPENDECTOMY  1964  . BREAST EXCISIONAL BIOPSY Left    benign  . BREAST LUMPECTOMY WITH RADIOACTIVE SEED LOCALIZATION Left 12/31/2014   Procedure: LEFT BREAST LUMPECTOMY WITH RADIOACTIVE SEED LOCALIZATION;  Surgeon: Donnie Mesa, MD;  Location: [facility];  Service: General;  Laterality: Left;  . CARPAL TUNNEL RELEASE Right 1977  . CARPAL TUNNEL RELEASE Left 2007  . CATARACT EXTRACTION Left 04/13/2014   Dr. Herbert Deaner  . CATARACT EXTRACTION Right 06/10/2014   Dr. Herbert Deaner  . CHOLECYSTECTOMY N/A 07/14/2017   Procedure: LAPAROSCOPIC CHOLECYSTECTOMY;  Surgeon: Ileana Roup, MD;  Location: [facility];  Service: General;  Laterality: N/A;  . COLONOSCOPY  2009  . JOINT REPLACEMENT    . MELANOMA EXCISION    . TONSILLECTOMY  1962  . TOTAL KNEE ARTHROPLASTY Left 2012  . TOTAL KNEE ARTHROPLASTY Right 2008   Family History  Problem Relation Age of Onset  . Heart attack Father        Died of  MI age 31  . Pulmonary embolism Mother        Multiple, B/L   . Hypertension Brother   . Heart disease Brother 34  . Diabetes Brother   . High Cholesterol Brother   . Hypertension Brother   . High Cholesterol Brother   . Hypertension Sister   . Diabetes type II Sister   . High Cholesterol Sister   . Alcohol abuse Brother   . Reye's syndrome Daughter   . Hepatitis Brother   . Stroke Other   . Cancer Maternal Grandmother 31  . Colon cancer Neg Hx   . Esophageal cancer Neg Hx   . Stomach cancer Neg Hx   . Rectal cancer Neg Hx    Social History   Socioeconomic History  . Marital status: Married     Spouse name: Not on file  . Number of children: Not on file  . Years of education: Not on file  . Highest education level: Not on file  Occupational History  . Not on file  Tobacco Use  . Smoking status: Former Smoker    Years: 15.00    Types: Cigarettes    Quit date: 05/09/1986    Years since quitting: 32.9  . Smokeless tobacco: Never Used  Substance and Sexual Activity  . Alcohol use: Yes    Alcohol/week: 0.0 standard drinks    Comment: Vacation only, couple times yearly   . Drug use: No  . Sexual activity: Yes  Other Topics Concern  . Not on file  Social History Narrative  . Not on file   Social Determinants of Health   Financial Resource Strain:   . Difficulty of Paying Living Expenses: Not on file  Food Insecurity:   . Worried About Charity fundraiser in the Last Year: Not on file  . Ran Out of Food in the Last Year: Not on file  Transportation Needs:   . Lack of Transportation (Medical): Not on file  . Lack of Transportation (Non-Medical): Not on file  Physical Activity:   . Days of Exercise per Week: Not on file  . Minutes of Exercise per Session: Not on file  Stress:   . Feeling of Stress : Not on file  Social Connections:   . Frequency of Communication with Friends and Family: Not on file  . Frequency of Social Gatherings with Friends and Family: Not on file  . Attends Religious Services: Not on file  . Active Member of Clubs or Organizations: Not on file  . Attends Archivist Meetings: Not on file  . Marital Status: Not on file    Outpatient Encounter Medications as of 04/22/2019  Medication Sig  . clindamycin (CLEOCIN) 300 MG capsule 4 tablets prior to dental work  . fenofibrate (TRICOR) 145 MG tablet Take 1 tablet by mouth once daily  . glipiZIDE (GLUCOTROL) 5 MG tablet TAKE 1 TABLET BY MOUTH TWICE DAILY BEFORE MEAL(S)  . losartan-hydrochlorothiazide (HYZAAR) 100-12.5 MG tablet Take 1 tablet by mouth daily.  Marland Kitchen omeprazole (PRILOSEC) 20 MG  capsule Take 1 capsule by mouth once daily  . PARoxetine (PAXIL) 30 MG tablet Take 1 tablet by mouth once daily  . propranolol (INDERAL) 10 MG tablet Take 1 tablet (10 mg total) by mouth 3 (three) times daily as needed.   No facility-administered encounter medications on file as of 04/22/2019.    Activities of Daily Living In your present state of health, do you have any difficulty performing the following activities:  04/22/2019  Hearing? Y  Vision? N  Difficulty concentrating or making decisions? N  Walking or climbing stairs? N  Dressing or bathing? N  Doing errands, shopping? N  Preparing Food and eating ? N  Using the Toilet? N  In the past six months, have you accidently leaked urine? Y  Do you have problems with loss of bowel control? N  Managing your Medications? N  Managing your Finances? N  Housekeeping or managing your Housekeeping? N  Some recent data might be hidden    Patient Care Team: Lauree Chandler, NP as PCP - General (Geriatric Medicine) Alanda Slim Neena Rhymes, MD as Consulting Physician (Ophthalmology)    Assessment:   This is a routine wellness examination for [place].  Exercise Activities and Dietary recommendations Current Exercise Habits: The patient does not participate in regular exercise at present, Exercise limited by: orthopedic condition(s)(increase in back pain)  Goals    . Weight (lb) < 200 lb (90.7 kg)     Starting 03/18/16, I will attempt to get under 200 lbs.     . weight loss (pt-stated)     I will attempt to get under 210 lbs       Fall Risk Fall Risk  04/22/2019 10/23/2018 04/18/2018 04/18/2018 10/17/2017  Falls in the past year? 0 0 0 0 No  Comment - - - - -  Number falls in past yr: 0 0 - 0 -  Injury with Fall? 0 0 - 0 -   Is the patient's home free of loose throw rugs in walkways, pet beds, electrical cords, etc?   yes      Grab bars in the bathroom? no      Handrails on the stairs?   yes      Adequate lighting?    yes  Timed Get Up and Go performed: na  Depression Screen PHQ 2/9 Scores 04/22/2019 10/23/2018 04/18/2018 04/18/2018  PHQ - 2 Score 0 0 0 0     Cognitive Function MMSE - Mini Mental State Exam 04/22/2019 04/13/2017 03/18/2016 01/29/2015  Not completed: Refused - - -  Orientation to time 5 5 5 5   Orientation to Place 5 5 5 5   Registration 3 3 3 3   Attention/ Calculation 5 5 5 5   Recall 3 3 3 1   Language- name 2 objects 2 2 2 2   Language- repeat 1 1 1 1   Language- follow 3 step command 3 3 3 3   Language- read & follow direction 1 1 1 1   Write a sentence 1 1 1 1   Copy design 1 1 1 1   Total score 30 30 30 28         Immunization History  Administered Date(s) Administered  . Fluad Quad(high Dose 65+) 02/05/2019  . Influenza Whole 01/26/2013  . Influenza, High Dose Seasonal PF 02/21/2018  . Influenza-Unspecified 01/28/2014, 02/18/2016, 03/01/2017  . Pneumococcal Conjugate-13 12/17/2013  . Pneumococcal Polysaccharide-23 04/09/2013, 04/18/2018  . Tdap 08/17/2009  . Zoster 01/09/2011  . Zoster Recombinat (Shingrix) 04/18/2018, 06/25/2018    Qualifies for Shingles Vaccine?done  Screening Tests Health Maintenance  Topic Date Due  . FOOT EXAM  04/19/2019  . TETANUS/TDAP  08/18/2019  . OPHTHALMOLOGY EXAM  09/07/2019  . HEMOGLOBIN A1C  10/16/2019  . COLONOSCOPY  04/14/2020  . MAMMOGRAM  12/24/2020  . INFLUENZA VACCINE  Completed  . DEXA SCAN  Completed  . Hepatitis C Screening  Completed  . PNA vac Low Risk Adult  Completed    Cancer Screenings:  Lung: Low Dose CT Chest recommended if Age 67-80 years, 30 pack-year currently smoking OR have quit w/in 15years. Patient does not qualify. Breast:  Up to date on Mammogram? Yes   Up to date of Bone Density/Dexa? No Colorectal: up to date   Additional Screenings:  Hepatitis C Screening: yes     Plan:      I have personally reviewed and noted the following in the patient's chart:   . Medical and social history . Use of  alcohol, tobacco or illicit drugs  . Current medications and supplements . Functional ability and status . Nutritional status . Physical activity . Advanced directives . List of other physicians . Hospitalizations, surgeries, and ER visits in previous 12 months . Vitals . Screenings to include cognitive, depression, and falls . Referrals and appointments  In addition, I have reviewed and discussed with patient certain preventive protocols, quality metrics, and best practice recommendations. A written personalized care plan for preventive services as well as general preventive health recommendations were provided to patient.     Lauree Chandler, NP  04/22/2019

## 2019-04-22 NOTE — Patient Instructions (Signed)
Jill Garza , Thank you for taking time to come for your Medicare Wellness Visit. I appreciate your ongoing commitment to your health goals. Please review the following plan we discussed and let me know if I can assist you in the future.   Screening recommendations/referrals: Colonoscopy up to date Mammogram up to date Bone Density OVER DUE- order placed, please schedule Recommended yearly ophthalmology/optometry visit for glaucoma screening and checkup Recommended yearly dental visit for hygiene and checkup  Vaccinations: Influenza vaccine up to date Pneumococcal vaccine up to date Tdap vaccine up to date Shingles vaccine up to date    Advanced directives: please bring to next Office visit or mail.  Conditions/risks identified: obesity  Next appointment:  1 year   Preventive Care 70 Years and Older, Female Preventive care refers to lifestyle choices and visits with your health care provider that can promote health and wellness. What does preventive care include?  A yearly physical exam. This is also called an annual well check.  Dental exams once or twice a year.  Routine eye exams. Ask your health care provider how often you should have your eyes checked.  Personal lifestyle choices, including:  Daily care of your teeth and gums.  Regular physical activity.  Eating a healthy diet.  Avoiding tobacco and drug use.  Limiting alcohol use.  Practicing safe sex.  Taking low-dose aspirin every day.  Taking vitamin and mineral supplements as recommended by your health care provider. What happens during an annual well check? The services and screenings done by your health care provider during your annual well check will depend on your age, overall health, lifestyle risk factors, and family history of disease. Counseling  Your health care provider may ask you questions about your:  Alcohol use.  Tobacco use.  Drug use.  Emotional well-being.  Home and relationship  well-being.  Sexual activity.  Eating habits.  History of falls.  Memory and ability to understand (cognition).  Work and work Statistician.  Reproductive health. Screening  You may have the following tests or measurements:  Height, weight, and BMI.  Blood pressure.  Lipid and cholesterol levels. These may be checked every 5 years, or more frequently if you are over 18 years old.  Skin check.  Lung cancer screening. You may have this screening every year starting at age 70 if you have a 30-pack-year history of smoking and currently smoke or have quit within the past 15 years.  Fecal occult blood test (FOBT) of the stool. You may have this test every year starting at age 40.  Flexible sigmoidoscopy or colonoscopy. You may have a sigmoidoscopy every 5 years or a colonoscopy every 10 years starting at age 39.  Hepatitis C blood test.  Hepatitis B blood test.  Sexually transmitted disease (STD) testing.  Diabetes screening. This is done by checking your blood sugar (glucose) after you have not eaten for a while (fasting). You may have this done every 1-3 years.  Bone density scan. This is done to screen for osteoporosis. You may have this done starting at age 37.  Mammogram. This may be done every 1-2 years. Talk to your health care provider about how often you should have regular mammograms. Talk with your health care provider about your test results, treatment options, and if necessary, the need for more tests. Vaccines  Your health care provider may recommend certain vaccines, such as:  Influenza vaccine. This is recommended every year.  Tetanus, diphtheria, and acellular pertussis (Tdap, Td) vaccine.  You may need a Td booster every 10 years.  Zoster vaccine. You may need this after age 42.  Pneumococcal 13-valent conjugate (PCV13) vaccine. One dose is recommended after age 9.  Pneumococcal polysaccharide (PPSV23) vaccine. One dose is recommended after age 37.  Talk to your health care provider about which screenings and vaccines you need and how often you need them. This information is not intended to replace advice given to you by your health care provider. Make sure you discuss any questions you have with your health care provider. Document Released: 05/22/2015 Document Revised: 01/13/2016 Document Reviewed: 02/24/2015 Elsevier Interactive Patient Education  2017 [employer] Prevention in the Home Falls can cause injuries. They can happen to people of all ages. There are many things you can do to make your home safe and to help prevent falls. What can I do on the outside of my home?  Regularly fix the edges of walkways and driveways and fix any cracks.  Remove anything that might make you trip as you walk through a door, such as a raised step or threshold.  Trim any bushes or trees on the path to your home.  Use bright outdoor lighting.  Clear any walking paths of anything that might make someone trip, such as rocks or tools.  Regularly check to see if handrails are loose or broken. Make sure that both sides of any steps have handrails.  Any raised decks and porches should have guardrails on the edges.  Have any leaves, snow, or ice cleared regularly.  Use sand or salt on walking paths during winter.  Clean up any spills in your garage right away. This includes oil or grease spills. What can I do in the bathroom?  Use night lights.  Install grab bars by the toilet and in the tub and shower. Do not use towel bars as grab bars.  Use non-skid mats or decals in the tub or shower.  If you need to sit down in the shower, use a plastic, non-slip stool.  Keep the floor dry. Clean up any water that spills on the floor as soon as it happens.  Remove soap buildup in the tub or shower regularly.  Attach bath mats securely with double-sided non-slip rug tape.  Do not have throw rugs and other things on the floor that can make you  trip. What can I do in the bedroom?  Use night lights.  Make sure that you have a light by your bed that is easy to reach.  Do not use any sheets or blankets that are too big for your bed. They should not hang down onto the floor.  Have a firm chair that has side arms. You can use this for support while you get dressed.  Do not have throw rugs and other things on the floor that can make you trip. What can I do in the kitchen?  Clean up any spills right away.  Avoid walking on wet floors.  Keep items that you use a lot in easy-to-reach places.  If you need to reach something above you, use a strong step stool that has a grab bar.  Keep electrical cords out of the way.  Do not use floor polish or wax that makes floors slippery. If you must use wax, use non-skid floor wax.  Do not have throw rugs and other things on the floor that can make you trip. What can I do with my stairs?  Do not leave any  items on the stairs.  Make sure that there are handrails on both sides of the stairs and use them. Fix handrails that are broken or loose. Make sure that handrails are as long as the stairways.  Check any carpeting to make sure that it is firmly attached to the stairs. Fix any carpet that is loose or worn.  Avoid having throw rugs at the top or bottom of the stairs. If you do have throw rugs, attach them to the floor with carpet tape.  Make sure that you have a light switch at the top of the stairs and the bottom of the stairs. If you do not have them, ask someone to add them for you. What else can I do to help prevent falls?  Wear shoes that:  Do not have high heels.  Have rubber bottoms.  Are comfortable and fit you well.  Are closed at the toe. Do not wear sandals.  If you use a stepladder:  Make sure that it is fully opened. Do not climb a closed stepladder.  Make sure that both sides of the stepladder are locked into place.  Ask someone to hold it for you, if  possible.  Clearly mark and make sure that you can see:  Any grab bars or handrails.  First and last steps.  Where the edge of each step is.  Use tools that help you move around (mobility aids) if they are needed. These include:  Canes.  Walkers.  Scooters.  Crutches.  Turn on the lights when you go into a dark area. Replace any light bulbs as soon as they burn out.  Set up your furniture so you have a clear path. Avoid moving your furniture around.  If any of your floors are uneven, fix them.  If there are any pets around you, be aware of where they are.  Review your medicines with your doctor. Some medicines can make you feel dizzy. This can increase your chance of falling. Ask your doctor what other things that you can do to help prevent falls. This information is not intended to replace advice given to you by your health care provider. Make sure you discuss any questions you have with your health care provider. Document Released: 02/19/2009 Document Revised: 10/01/2015 Document Reviewed: 05/30/2014 Elsevier Interactive Patient Education  2017 [employer].

## 2019-04-23 NOTE — Progress Notes (Signed)
Reviewed labs with pt during her OV, referral to endocrine made

## 2019-04-24 ENCOUNTER — Ambulatory Visit: Payer: Medicare Other | Admitting: Internal Medicine

## 2019-04-26 ENCOUNTER — Other Ambulatory Visit: Payer: Self-pay

## 2019-04-30 ENCOUNTER — Encounter: Payer: Self-pay | Admitting: Internal Medicine

## 2019-04-30 ENCOUNTER — Ambulatory Visit (INDEPENDENT_AMBULATORY_CARE_PROVIDER_SITE_OTHER): Payer: Medicare Other | Admitting: Internal Medicine

## 2019-04-30 ENCOUNTER — Other Ambulatory Visit: Payer: Self-pay

## 2019-04-30 NOTE — Patient Instructions (Addendum)
-   Please stay hydrated  - AVOID CALCIUM SUPPLEMENTS, AVOID LOW CALCIUM DIET - Maintain normal dietary calcium intake (2-3 servings of dairy a day) - I recommend stopping the hydrochlorothiazide- will defer this to your PCP. Once you've been off the hydrochlorothiazide for 4 weeks, please proceed with 24-hr urine collection - You will need labs when you drop off the urine sample     24-Hour Urine Collection   You will be collecting your urine for a 24-hour period of time.  Your timer starts with your first urine of the morning (For example - If you first pee at [place], your timer will start at [place])  [place] away your first urine of the morning  Collect your urine every time you pee for the next 24 hours STOP your urine collection 24 hours after you started the collection (For example - You would stop at 9AM the day after you started)

## 2019-04-30 NOTE — Progress Notes (Signed)
Name: Jill Garza  MRN/ DOB: [id number], [birth year]    Age/ Sex: 68 y.o., female    PCP: Lauree Chandler, NP   Reason for Endocrinology Evaluation: Hypercalcemia      Date of Initial Endocrinology Evaluation: 04/30/2019     HPI: Jill Garza is a 68 y.o. female with a past medical history of T2DM and HTN. The patient presented for initial endocrinology clinic visit on 04/30/2019 for consultative assistance with her hypercalcemia .   Jill Garza indicates that she was first diagnosed with hypercalcemia in 2016. Since that time, she has chronic symptoms of  polyuria, polydipsia,has occasional constipation , denies  generalized weakness, diffuse muscle pains, significant memory impairment. She denies  use of over the counter calcium (including supplements, Tums, Rolaids, or other calcium containing antacids), lithium or vitamin D supplements.   She is on HCTZ  She denies  history of kidney stones, but has chronic kidney disease,but no  liver disease, granulomatous disease. She denies osteoporosis or prior fractures. Daily dietary calcium intake: 1 servings . She denies family history of osteoporosis, parathyroid disease, thyroid disease.    Retired Health visitor.     HISTORY:  Past Medical History:  Past Medical History:  Diagnosis Date  . Arthritis   . Cancer ([place])    melanoma- right leg  . Depression   . GERD (gastroesophageal reflux disease)   . History of hiatal hernia   . Hypercholesterolemia   . Hypertension   . Sweat, sweating, excessive   . Type 2 diabetes mellitus ([place])    Past Surgical History:  Past Surgical History:  Procedure Laterality Date  . ABDOMINAL HYSTERECTOMY  1979  . APPENDECTOMY  1964  . BREAST EXCISIONAL BIOPSY Left    benign  . BREAST LUMPECTOMY WITH RADIOACTIVE SEED LOCALIZATION Left 12/31/2014   Procedure: LEFT BREAST LUMPECTOMY WITH RADIOACTIVE SEED LOCALIZATION;  Surgeon: Donnie Mesa, MD;  Location: [facility];   Service: General;  Laterality: Left;  . CARPAL TUNNEL RELEASE Right 1977  . CARPAL TUNNEL RELEASE Left 2007  . CATARACT EXTRACTION Left 04/13/2014   Dr. Herbert Deaner  . CATARACT EXTRACTION Right 06/10/2014   Dr. Herbert Deaner  . CHOLECYSTECTOMY N/A 07/14/2017   Procedure: LAPAROSCOPIC CHOLECYSTECTOMY;  Surgeon: Ileana Roup, MD;  Location: [facility];  Service: General;  Laterality: N/A;  . COLONOSCOPY  2009  . JOINT REPLACEMENT    . MELANOMA EXCISION    . TONSILLECTOMY  1962  . TOTAL KNEE ARTHROPLASTY Left 2012  . TOTAL KNEE ARTHROPLASTY Right 2008      Social History:  reports that she quit smoking about 32 years ago. Her smoking use included cigarettes. She quit after 15.00 years of use. She has never used smokeless tobacco. She reports current alcohol use. She reports that she does not use drugs.  Family History: family history includes Alcohol abuse in her brother; Cancer (age of onset: 47) in her maternal grandmother; Diabetes in her brother; Diabetes type II in her sister; Heart attack in her father; Heart disease (age of onset: 68) in her brother; Hepatitis in her brother; High Cholesterol in her brother, brother, and sister; Hypertension in her brother, brother, and sister; Pulmonary embolism in her mother; Reye's syndrome in her daughter; Stroke in an other family member.   HOME MEDICATIONS: Allergies as of 04/30/2019      Reactions   Penicillins    Rash    Ivp Dye [iodinated Diagnostic Agents]    Hives  Percocet [oxycodone-acetaminophen]    Itching       Medication List       Accurate as of April 30, 2019  3:09 PM. If you have any questions, ask your nurse or doctor.        clindamycin 300 MG capsule Commonly known as: CLEOCIN 4 tablets prior to dental work   fenofibrate 145 MG tablet Commonly known as: TRICOR Take 1 tablet by mouth once daily   glipiZIDE 5 MG tablet Commonly known as: GLUCOTROL TAKE 1 TABLET BY MOUTH TWICE DAILY BEFORE MEAL(S)     losartan-hydrochlorothiazide 100-12.5 MG tablet Commonly known as: HYZAAR Take 1 tablet by mouth daily.   omeprazole 20 MG capsule Commonly known as: PRILOSEC Take 1 capsule by mouth once daily   PARoxetine 30 MG tablet Commonly known as: PAXIL Take 1 tablet by mouth once daily   propranolol 10 MG tablet Commonly known as: INDERAL Take 1 tablet (10 mg total) by mouth 3 (three) times daily as needed.         REVIEW OF SYSTEMS: A comprehensive ROS was conducted with the patient and is negative except as per HPI and below:  Review of Systems  Cardiovascular: Negative for chest pain and palpitations.       OBJECTIVE:  VS: BP 122/78 (BP Location: Left Arm, Patient Position: Sitting, Cuff Size: Large)   Pulse 96   Temp 98.3 F (36.8 C)   Ht 5\' 4"  (1.626 m)   Wt 231 lb 12.8 oz (105.1 kg)   SpO2 97%   BMI 39.79 kg/m    Wt Readings from Last 3 Encounters:  04/30/19 231 lb 12.8 oz (105.1 kg)  04/22/19 229 lb 12.8 oz (104.2 kg)  04/22/19 229 lb (103.9 kg)     EXAM: General: Pt appears well and is in NAD  Hydration: Well-hydrated with moist mucous membranes and good skin turgor  Eyes: External eye exam normal without stare, lid lag or exophthalmos.  EOM intact.  PERRL.  Ears, Nose, Throat: Hearing: Grossly intact bilaterally Dental: Good dentition  Throat: Clear without mass, erythema or exudate  Neck: General: Supple without adenopathy. Thyroid: Thyroid size normal.  No goiter or nodules appreciated. No thyroid bruit.  Lungs: Clear with good BS bilat with no rales, rhonchi, or wheezes  Heart: Auscultation: RRR.  Abdomen: Normoactive bowel sounds, soft, nontender, without masses or organomegaly palpable  Extremities: Gait and station: Normal gait  Digits and nails: No clubbing, cyanosis, petechiae, or nodes Head and neck: Normal alignment and mobility BL UE: Normal ROM and strength. BL LE: No pretibial edema normal ROM and strength.  Skin: Hair: Texture and amount  normal with gender appropriate distribution Skin Inspection: No rashes, acanthosis nigricans/skin tags. No lipohypertrophy Skin Palpation: Skin temperature, texture, and thickness normal to palpation  Neuro: Cranial nerves: II - XII grossly intact  Cerebellar: Normal coordination and movement; no tremor Motor: Normal strength throughout DTRs: 2+ and symmetric in UE without delay in relaxation phase  Mental Status: Judgment, insight: Intact Orientation: Oriented to time, place, and person Memory: Intact for recent and remote events Mood and affect: No depression, anxiety, or agitation     DATA REVIEWED:   Results for Jill Garza, Jill Garza (MRN [id number]) as of 05/02/2019 11:30  Ref. Range 04/17/2019 08:56 04/17/2019 08:56  Sodium Latest Ref Range: 135 - 146 mmol/L 137   Potassium Latest Ref Range: 3.5 - 5.3 mmol/L 4.4   Chloride Latest Ref Range: 98 - 110 mmol/L 103   CO2 Latest  Ref Range: 20 - 32 mmol/L 24   Glucose Latest Ref Range: 65 - 99 mg/dL 129 (H)   Mean Plasma Glucose Latest Units: (calc) 143   BUN Latest Ref Range: 7 - 25 mg/dL 21   Creatinine Latest Ref Range: 0.60 - 0.93 mg/dL 1.27 (H)   Calcium Latest Ref Range: 8.6 - 10.4 mg/dL 10.7 (H) 10.7 (H)  BUN/Creatinine Ratio Latest Ref Range: 6 - 22 (calc) 17   Calcium Ionized Latest Ref Range: 4.8 - 5.6 mg/dL 5.61 (H)   AG Ratio Latest Ref Range: 1.0 - 2.5 (calc) 1.6   AST Latest Ref Range: 10 - 35 U/L 20   ALT Latest Ref Range: 6 - 29 U/L 21   Total Protein Latest Ref Range: 6.1 - 8.1 g/dL 7.2   Total Bilirubin Latest Ref Range: 0.2 - 1.2 mg/dL 0.4   GFR, Est Non African American Latest Ref Range: > OR = 60 mL/min/1.2m2 43 (L)   GFR, Est African American Latest Ref Range: > OR = 60 mL/min/1.52m2 50 (L)   Alkaline phosphatase (APISO) Latest Ref Range: 37 - 153 U/L 40   PTH, Intact Latest Ref Range: 14 - 64 pg/mL 85 (H)   Albumin MSPROF Latest Ref Range: 3.6 - 5.1 g/dL 4.4     ASSESSMENT/PLAN/RECOMMENDATIONS:    1. 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   - Will proceed with 24 hr urine collection after her HCTZ has been discontinued- I will defer this to her PCP . Pt instructed to wait 4 weeks off the HCTZ prior to proceeding with the urine collection . Pt will have labs the day she returns urine.   - HCTZ has the tendency to increase serum calcium in people who have hyperparathyroidism. It also can make the urine calcium falsely low .   - Encouraged hydration  - AVOID CALCIUM SUPPLEMENTS, AVOID LOW CALCIUM DIET - Maintain normal dietary calcium intake (2-3 servings of dairy a day)    F/U in 4 months     Signed electronically by: Mack Guise, MD  [facility]  [facility] [address] [place], [place] [address] Phone: [phone] FAX: [phone]   CC: Lauree Chandler, NP [place] [address] Phone: [phone] Fax: [phone]   Return to Endocrinology clinic as below: Future Appointments  Date Time Provider [place]  07/19/2019  9:00 AM GI-BCG DX DEXA 1 GI-BCGDG GI-BREAST CE  10/21/2019  9:00 AM PSC-PSC LAB PSC-PSC None  10/23/2019 10:00 AM Lauree Chandler, NP PSC-PSC None  04/22/2020 10:00 AM Lauree Chandler, NP PSC-PSC None

## 2019-05-01 ENCOUNTER — Encounter: Payer: Self-pay | Admitting: Nurse Practitioner

## 2019-05-02 ENCOUNTER — Other Ambulatory Visit: Payer: Self-pay | Admitting: Nurse Practitioner

## 2019-05-02 ENCOUNTER — Encounter: Payer: Self-pay | Admitting: Internal Medicine

## 2019-05-02 DIAGNOSIS — I10 Essential (primary) hypertension: Secondary | ICD-10-CM

## 2019-05-02 MED ORDER — LOSARTAN POTASSIUM 100 MG PO TABS: 100.0000 mg | ORAL_TABLET | Freq: Every day | ORAL | 3 refills | Status: DC

## 2019-05-08 ENCOUNTER — Other Ambulatory Visit: Payer: Self-pay | Admitting: Nurse Practitioner

## 2019-05-24 ENCOUNTER — Other Ambulatory Visit: Payer: Self-pay | Admitting: Nurse Practitioner

## 2019-07-07 ENCOUNTER — Other Ambulatory Visit: Payer: Self-pay | Admitting: Nurse Practitioner

## 2019-07-07 DIAGNOSIS — E119 Type 2 diabetes mellitus without complications: Secondary | ICD-10-CM

## 2019-07-16 ENCOUNTER — Encounter: Payer: Self-pay | Admitting: Physician Assistant

## 2019-07-16 ENCOUNTER — Telehealth (INDEPENDENT_AMBULATORY_CARE_PROVIDER_SITE_OTHER): Payer: Medicare Other | Admitting: Physician Assistant

## 2019-07-16 ENCOUNTER — Other Ambulatory Visit: Payer: Self-pay

## 2019-07-16 ENCOUNTER — Other Ambulatory Visit (INDEPENDENT_AMBULATORY_CARE_PROVIDER_SITE_OTHER): Payer: Medicare Other

## 2019-07-16 VITALS — Ht 64.0 in

## 2019-07-16 DIAGNOSIS — Z6839 Body mass index (BMI) 39.0-39.9, adult: Secondary | ICD-10-CM

## 2019-07-16 DIAGNOSIS — E669 Obesity, unspecified: Secondary | ICD-10-CM | POA: Diagnosis not present

## 2019-07-16 DIAGNOSIS — R002 Palpitations: Secondary | ICD-10-CM

## 2019-07-16 DIAGNOSIS — I10 Essential (primary) hypertension: Secondary | ICD-10-CM | POA: Diagnosis not present

## 2019-07-16 DIAGNOSIS — Z87891 Personal history of nicotine dependence: Secondary | ICD-10-CM | POA: Diagnosis not present

## 2019-07-16 LAB — BASIC METABOLIC PANEL
BUN: 21 mg/dL (ref 6–23)
CO2: 26 mEq/L (ref 19–32)
Calcium: 10.6 mg/dL — ABNORMAL HIGH (ref 8.4–10.5)
Chloride: 103 mEq/L (ref 96–112)
Creatinine, Ser: 1.18 mg/dL (ref 0.40–1.20)
GFR: 45.21 mL/min — ABNORMAL LOW (ref >60.00)
Glucose, Bld: 131 mg/dL — ABNORMAL HIGH (ref 70–99)
Potassium: 4.2 mEq/L (ref 3.5–5.1)
Sodium: 136 mEq/L (ref 135–145)

## 2019-07-16 LAB — ALBUMIN: Albumin: 4.3 g/dL (ref 3.5–5.2)

## 2019-07-16 LAB — VITAMIN D 25 HYDROXY (VIT D DEFICIENCY, FRACTURES): VITD: 19.03 ng/mL — ABNORMAL LOW (ref 30.00–100.00)

## 2019-07-16 MED ORDER — PROPRANOLOL HCL 10 MG PO TABS: 10.0000 mg | ORAL_TABLET | Freq: Three times a day (TID) | ORAL | 3 refills | Status: DC | PRN

## 2019-07-16 NOTE — Patient Instructions (Signed)
Medication Instructions:  Your physician recommends that you continue on your current medications as directed. Please refer to the Current Medication list given to you today.  *If you need a refill on your cardiac medications before your next appointment, please call your pharmacy*   Follow-Up: At [facility], you and your health needs are our priority.  As part of our continuing mission to provide you with exceptional heart care, we have created designated Provider Care Teams.  These Care Teams include your primary Cardiologist (physician) and Advanced Practice Providers (APPs -  Physician Assistants and Nurse Practitioners) who all work together to provide you with the care you need, when you need it.  We recommend signing up for the patient portal called "MyChart".  Sign up information is provided on this After Visit Summary.  MyChart is used to connect with patients for Virtual Visits (Telemedicine).  Patients are able to view lab/test results, encounter notes, upcoming appointments, etc.  Non-urgent messages can be sent to your provider as well.   To learn more about what you can do with MyChart, go to [email or web address].    Your next appointment:   12 month(s)  The format for your next appointment:   In Person  Provider:   You may see Minus Breeding, MD or one of the following Advanced Practice Providers on your designated Care Team:    Rosaria Ferries, PA-C  Jory Sims, DNP, ANP  Cadence Kathlen Mody, NP    Other Instructions Please call our office 2 months in advance to schedule your follow-up appointment.  Increase your activity as you are able to. Please do not forget to take care of yourself. Caregivers need care too.

## 2019-07-16 NOTE — Progress Notes (Signed)
Virtual Visit via Video Note   This visit type was conducted due to national recommendations for restrictions regarding the COVID-19 Pandemic (e.g. social distancing) in an effort to limit this patient's exposure and mitigate transmission in our community.  Due to her co-morbid illnesses, this patient is at least at moderate risk for complications without adequate follow up.  This format is felt to be most appropriate for this patient at this time.  All issues noted in this document were discussed and addressed.  A limited physical exam was performed with this format.  Please refer to the patient's chart for her consent to telehealth for [facility].  Evaluation Performed:  Follow-up visit  This visit type was conducted due to national recommendations for restrictions regarding the COVID-19 Pandemic (e.g. social distancing).  This format is felt to be most appropriate for this patient at this time.  All issues noted in this document were discussed and addressed.  No physical exam was performed (except for noted visual exam findings with Video Visits).  Please refer to the patient's chart (MyChart message for video visits and phone note for telephone visits) for the patient's consent to telehealth for [facility].  Date:  07/16/2019   ID:  Jill, Garza 09-14-1950, MRN [id number]  Patient Location:  Home  Provider location:   Office  PCP:  Lauree Chandler, NP  Cardiologist:  Minus Breeding, MD 01/10/2019 Electrophysiologist:  None   Chief Complaint: Cardiology follow-up  History of Present Illness:    Jill Garza is a 69 y.o. female who presents via audio/video conferencing for a telehealth visit today.    69 y.o. yo female who has a history of palpitations on propanolol, PVCs, chest pain w/ neg POET, OA, HTN, HLD, DM, GERD/HH  Her activity level has been decreased because of Covid quarantine and because of a knee replacement.  However, with the improvement in  the weather, she is thinking she will start walking more.  Additionally, her husband just had knee surgery and she is his primary caregiver.  That is increasing her activity as well.  Her sister is about to have a knee replacement and she is going to go help care for her in a week or so.  She plans to stay with her sister for several weeks, getting her to the point that she can care for herself.  Over the winter, she admits that she and her husband have eaten poorly.  They are trying to do better now.  She has not had any significant palpitations.  She may have had a couple of skipped beats, but not many.  She has not had to take the propanolol.  She has not had any chest pain.  Her dyspnea on exertion is at baseline.  She denies waking with lower extremity edema.  She denies orthopnea or PND.  She has not had presyncope or syncope.  She does not have a blood pressure cuff.  Every time she gets her blood pressure taken, she is complemented on how good it is.  The patient does not have symptoms concerning for COVID-19 infection (fever, chills, cough, or new shortness of breath).    Prior CV studies:   The following studies were reviewed today:  POET: 05/08/2018  There was no ST segment deviation noted during stress.  5 min 56 sec exercise.  PVC's noted during stress, quadrageminy pattern.  Overall low risk ETT with no electrocardiographic evidence of ischemia. PVC's  noted during exercise.   Past Medical History:  Diagnosis Date  . Arthritis   . Cancer ([place])    melanoma- right leg  . Depression   . GERD (gastroesophageal reflux disease)   . History of hiatal hernia   . Hypercholesterolemia   . Hypertension   . Sweat, sweating, excessive   . Type 2 diabetes mellitus ([place])    Past Surgical History:  Procedure Laterality Date  . ABDOMINAL HYSTERECTOMY  1979  . APPENDECTOMY  1964  . BREAST EXCISIONAL BIOPSY Left    benign  . BREAST LUMPECTOMY WITH RADIOACTIVE SEED  LOCALIZATION Left 12/31/2014   Procedure: LEFT BREAST LUMPECTOMY WITH RADIOACTIVE SEED LOCALIZATION;  Surgeon: Donnie Mesa, MD;  Location: [facility];  Service: General;  Laterality: Left;  . CARPAL TUNNEL RELEASE Right 1977  . CARPAL TUNNEL RELEASE Left 2007  . CATARACT EXTRACTION Left 04/13/2014   Dr. Herbert Deaner  . CATARACT EXTRACTION Right 06/10/2014   Dr. Herbert Deaner  . CHOLECYSTECTOMY N/A 07/14/2017   Procedure: LAPAROSCOPIC CHOLECYSTECTOMY;  Surgeon: Ileana Roup, MD;  Location: [facility];  Service: General;  Laterality: N/A;  . COLONOSCOPY  2009  . JOINT REPLACEMENT    . MELANOMA EXCISION    . TONSILLECTOMY  1962  . TOTAL KNEE ARTHROPLASTY Left 2012  . TOTAL KNEE ARTHROPLASTY Right 2008     Current Meds  Medication Sig  . clindamycin (CLEOCIN) 300 MG capsule 4 tablets prior to dental work  . fenofibrate (TRICOR) 145 MG tablet Take 1 tablet by mouth once daily  . glipiZIDE (GLUCOTROL) 5 MG tablet TAKE 1 TABLET BY MOUTH TWICE DAILY BEFORE MEAL(S)  . losartan (COZAAR) 100 MG tablet Take 1 tablet (100 mg total) by mouth daily.  Marland Kitchen omeprazole (PRILOSEC) 20 MG capsule Take 1 capsule by mouth once daily  . PARoxetine (PAXIL) 30 MG tablet Take 1 tablet by mouth once daily  . propranolol (INDERAL) 10 MG tablet Take 1 tablet (10 mg total) by mouth 3 (three) times daily as needed.  . [DISCONTINUED] propranolol (INDERAL) 10 MG tablet Take 1 tablet (10 mg total) by mouth 3 (three) times daily as needed.     Allergies:   Penicillins, Ivp dye [iodinated diagnostic agents], and Percocet [oxycodone-acetaminophen]   Social History   Tobacco Use  . Smoking status: Former Smoker    Years: 15.00    Types: Cigarettes    Quit date: 05/09/1986    Years since quitting: 33.2  . Smokeless tobacco: Never Used  Substance Use Topics  . Alcohol use: Yes    Alcohol/week: 0.0 standard drinks    Comment: Vacation only, couple times yearly   . Drug use: No     Family Hx: The patient's family  history includes Alcohol abuse in her brother; Cancer (age of onset: 85) in her maternal grandmother; Diabetes in her brother; Diabetes type II in her sister; Heart attack in her father; Heart disease (age of onset: 66) in her brother; Hepatitis in her brother; High Cholesterol in her brother, brother, and sister; Hypertension in her brother, brother, and sister; Pulmonary embolism in her mother; Reye's syndrome in her daughter; Stroke in an other family member. There is no history of Colon cancer, Esophageal cancer, Stomach cancer, or Rectal cancer.  ROS:   Please see the history of present illness.    All other systems reviewed and are negative.   Labs/Other Tests and Data Reviewed:    Recent Labs: 04/17/2019: ALT 21; BUN 21; Creat 1.27; Hemoglobin 14.2; Platelets  274; Potassium 4.4; Sodium 137   CBC    Component Value Date/Time   WBC 5.7 04/17/2019 0856   RBC 4.90 04/17/2019 0856   HGB 14.2 04/17/2019 0856   HGB 13.4 01/23/2015 0821   HCT 43.1 04/17/2019 0856   HCT 40.6 01/23/2015 0821   PLT 274 04/17/2019 0856   PLT 260 01/23/2015 0821   MCV 88.0 04/17/2019 0856   MCV 89 01/23/2015 0821   MCH 29.0 04/17/2019 0856   MCHC 32.9 04/17/2019 0856   RDW 12.5 04/17/2019 0856   RDW 13.6 01/23/2015 0821   LYMPHSABS 2,195 04/17/2019 0856   LYMPHSABS 2.6 01/23/2015 0821   MONOABS 0.2 07/14/2017 0518   EOSABS 80 04/17/2019 0856   EOSABS 0.1 01/23/2015 0821   BASOSABS 51 04/17/2019 0856   BASOSABS 0.0 01/23/2015 0821    CMP Latest Ref Rng & Units 04/17/2019 04/17/2019 10/19/2018  Glucose 65 - 99 mg/dL 129(H) - 142(H)  BUN 7 - 25 mg/dL 21 - 25  Creatinine 0.60 - 0.93 mg/dL 1.27(H) - 1.33(H)  Sodium 135 - 146 mmol/L 137 - 138  Potassium 3.5 - 5.3 mmol/L 4.4 - 4.6  Chloride 98 - 110 mmol/L 103 - 102  CO2 20 - 32 mmol/L 24 - 27  Calcium 8.6 - 10.4 mg/dL 10.7(H) 10.7(H) 11.0(H)  Total Protein 6.1 - 8.1 g/dL 7.2 - 7.0  Total Bilirubin 0.2 - 1.2 mg/dL 0.4 - 0.4  Alkaline Phos 38 - 126 U/L  - - -  AST 10 - 35 U/L 20 - 19  ALT 6 - 29 U/L 21 - 17     Recent Lipid Panel Lab Results  Component Value Date/Time   CHOL 149 04/17/2019 08:56 AM   CHOL 177 09/02/2015 08:46 AM   TRIG 137 04/17/2019 08:56 AM   HDL 41 (L) 04/17/2019 08:56 AM   HDL 50 09/02/2015 08:46 AM   CHOLHDL 3.6 04/17/2019 08:56 AM   LDLCALC 84 04/17/2019 08:56 AM    Wt Readings from Last 3 Encounters:  04/30/19 231 lb 12.8 oz (105.1 kg)  04/22/19 229 lb 12.8 oz (104.2 kg)  04/22/19 229 lb (103.9 kg)     Objective:    Vital Signs:  Ht 5\' 4"  (1.626 m)   BMI 39.79 kg/m    69 y.o. female in no acute distress.   ASSESSMENT & PLAN:    1.  Palpitations: -We will renew the propanolol prescription, as her blood pressures under good control, she should only take it for the palpitations when she needs it. -She is having no symptoms concerning for cardiac ischemia or CHF, no cardiac testing is needed  2.  Obesity: -We discussed some of the reasons for weight gain and not being able to lose weight over the winter. -She is encouraged to increase her activity as she is able -She and her husband are trying to eat healthier.  She is encouraged to continue this.  3.  Hypertension: -According to the patient, her blood pressure is under good control. -She is encouraged to get it checked often, continue current therapy   COVID-19 Education: The signs and symptoms of COVID-19 were discussed with the patient and how to seek care for testing (follow up with PCP or arrange E-visit).  The importance of social distancing was discussed today.  Patient Risk:   After full review of this patient's clinical status, I feel that they are at least moderate risk at this time.  Time:   Today, I have spent 23 minutes with  the patient with telehealth technology discussing cardiac and other health issues.     Medication Adjustments/Labs and Tests Ordered: Current medicines are reviewed at length with the patient today.   Concerns regarding medicines are outlined above.  Tests Ordered: No orders of the defined types were placed in this encounter.  Medication Changes: Meds ordered this encounter  Medications  . propranolol (INDERAL) 10 MG tablet    Sig: Take 1 tablet (10 mg total) by mouth 3 (three) times daily as needed.    Dispense:  90 tablet    Refill:  3    Disposition:  Follow up with Minus Breeding, MD    Signed, Rosaria Ferries, PA-C  07/16/2019 11:42 AM    [place]

## 2019-07-17 LAB — CREATININE, URINE, 24 HOUR: Creatinine, 24H Ur: 1.45 g/(24.h) (ref 0.50–2.15)

## 2019-07-17 LAB — PARATHYROID HORMONE, INTACT (NO CA): PTH: 83 pg/mL — ABNORMAL HIGH (ref 14–64)

## 2019-07-17 LAB — CALCIUM, URINE, 24 HOUR: Calcium, 24H Urine: 464 mg/24 h — ABNORMAL HIGH

## 2019-07-19 ENCOUNTER — Other Ambulatory Visit: Payer: Self-pay

## 2019-07-19 ENCOUNTER — Ambulatory Visit
Admission: RE | Admit: 2019-07-19 | Discharge: 2019-07-19 | Disposition: A | Discharge status: Home / Self Care | Payer: Medicare Other | Source: Ambulatory Visit | Attending: Nurse Practitioner | Admitting: Nurse Practitioner

## 2019-07-19 DIAGNOSIS — E2839 Other primary ovarian failure: Secondary | ICD-10-CM

## 2019-07-19 DIAGNOSIS — M8589 Other specified disorders of bone density and structure, multiple sites: Secondary | ICD-10-CM | POA: Diagnosis not present

## 2019-07-19 DIAGNOSIS — Z780 Asymptomatic menopausal state: Secondary | ICD-10-CM | POA: Diagnosis not present

## 2019-08-27 ENCOUNTER — Other Ambulatory Visit: Payer: Self-pay

## 2019-08-29 ENCOUNTER — Other Ambulatory Visit: Payer: Self-pay

## 2019-08-29 ENCOUNTER — Encounter: Payer: Self-pay | Admitting: Internal Medicine

## 2019-08-29 ENCOUNTER — Ambulatory Visit (INDEPENDENT_AMBULATORY_CARE_PROVIDER_SITE_OTHER): Payer: Medicare Other | Admitting: Internal Medicine

## 2019-08-29 VITALS — BP 118/62 | HR 87 | Temp 98.7°F | Ht 64.0 in | Wt 224.6 lb

## 2019-08-29 DIAGNOSIS — E559 Vitamin D deficiency, unspecified: Secondary | ICD-10-CM

## 2019-08-29 DIAGNOSIS — E210 Primary hyperparathyroidism: Secondary | ICD-10-CM | POA: Diagnosis not present

## 2019-08-29 LAB — BASIC METABOLIC PANEL
BUN: 23 mg/dL (ref 6–23)
CO2: 27 mEq/L (ref 19–32)
Calcium: 10.8 mg/dL — ABNORMAL HIGH (ref 8.4–10.5)
Chloride: 101 mEq/L (ref 96–112)
Creatinine, Ser: 1.27 mg/dL — ABNORMAL HIGH (ref 0.40–1.20)
GFR: 41.52 mL/min — ABNORMAL LOW (ref >60.00)
Glucose, Bld: 118 mg/dL — ABNORMAL HIGH (ref 70–99)
Potassium: 4 mEq/L (ref 3.5–5.1)
Sodium: 136 mEq/L (ref 135–145)

## 2019-08-29 LAB — ALBUMIN: Albumin: 4.4 g/dL (ref 3.5–5.2)

## 2019-08-29 LAB — VITAMIN D 25 HYDROXY (VIT D DEFICIENCY, FRACTURES): VITD: 23.25 ng/mL — ABNORMAL LOW (ref 30.00–100.00)

## 2019-08-29 NOTE — Patient Instructions (Signed)
-   Please stay hydrated  °- AVOID CALCIUM SUPPLEMENTS, AVOID LOW CALCIUM DIET °- Maintain normal dietary calcium intake (2-3 servings of dairy a day) ° ° ° ° ° ° ° ° °

## 2019-08-29 NOTE — Progress Notes (Signed)
Name: Jill Garza  MRN/ DOB: [id number], 02-09-1951    Age/ Sex: 69 y.o., female     PCP: Lauree Chandler, NP   Reason for Endocrinology Evaluation: Hypercalcemia     Initial Endocrinology Clinic Visit: 04/30/2019    PATIENT IDENTIFIER: Jill Garza is a 69 y.o., female with a past medical history of T2DM, HTN and Hypercalcemia. She has followed with [facility] since 04/30/2019 for consultative assistance with management of her  Hypercalcemia .   HISTORICAL SUMMARY: The patient was first diagnosed with hypercalcemia in 2016.   Her DXA (07/19/2019) showed a low bone density with a Left  femoral neck T-Score of -1.8 CT scan (2019)- no nephrolithiasis 24- hr urine Ca/Cr ratio 0.049  She is a retired Health visitor SUBJECTIVE:   Today (08/30/2019):  Jill Garza is here for a follow up on hypercalcemia.   Has chronic polyuria and polydipsia No recent dx of nephrolithiasis   Vitamin D 2000 iu daily    ROS:  As per HPI.   HISTORY:  Past Medical History:  Past Medical History:  Diagnosis Date  . Arthritis   . Cancer ([place])    melanoma- right leg  . Depression   . GERD (gastroesophageal reflux disease)   . History of hiatal hernia   . Hypercholesterolemia   . Hypertension   . Sweat, sweating, excessive   . Type 2 diabetes mellitus ([facility])    Past Surgical History:  Past Surgical History:  Procedure Laterality Date  . ABDOMINAL HYSTERECTOMY  1979  . APPENDECTOMY  1964  . BREAST EXCISIONAL BIOPSY Left    benign  . BREAST LUMPECTOMY WITH RADIOACTIVE SEED LOCALIZATION Left 12/31/2014   Procedure: LEFT BREAST LUMPECTOMY WITH RADIOACTIVE SEED LOCALIZATION;  Surgeon: Donnie Mesa, MD;  Location: [facility];  Service: General;  Laterality: Left;  . CARPAL TUNNEL RELEASE Right 1977  . CARPAL TUNNEL RELEASE Left 2007  . CATARACT EXTRACTION Left 04/13/2014   Dr. Herbert Deaner  . CATARACT EXTRACTION Right 06/10/2014   Dr. Herbert Deaner  .  CHOLECYSTECTOMY N/A 07/14/2017   Procedure: LAPAROSCOPIC CHOLECYSTECTOMY;  Surgeon: Ileana Roup, MD;  Location: [facility];  Service: General;  Laterality: N/A;  . COLONOSCOPY  2009  . JOINT REPLACEMENT    . MELANOMA EXCISION    . TONSILLECTOMY  1962  . TOTAL KNEE ARTHROPLASTY Left 2012  . TOTAL KNEE ARTHROPLASTY Right 2008    Social History:  reports that she quit smoking about 33 years ago. Her smoking use included cigarettes. She quit after 15.00 years of use. She has never used smokeless tobacco. She reports current alcohol use. She reports that she does not use drugs. Family History:  Family History  Problem Relation Age of Onset  . Heart attack Father        Died of MI age 64  . Pulmonary embolism Mother        Multiple, B/L   . Hypertension Brother   . Heart disease Brother 31  . Diabetes Brother   . High Cholesterol Brother   . Hypertension Brother   . High Cholesterol Brother   . Hypertension Sister   . Diabetes type II Sister   . High Cholesterol Sister   . Alcohol abuse Brother   . Reye's syndrome Daughter   . Hepatitis Brother   . Stroke Other   . Cancer Maternal Grandmother 45  . Colon cancer Neg Hx   . Esophageal cancer Neg Hx   . Stomach  cancer Neg Hx   . Rectal cancer Neg Hx      HOME MEDICATIONS: Allergies as of 08/29/2019      Reactions   Penicillins    Rash    Ivp Dye [iodinated Diagnostic Agents]    Hives    Percocet [oxycodone-acetaminophen]    Itching       Medication List       Accurate as of August 29, 2019 11:59 PM. If you have any questions, ask your nurse or doctor.        clindamycin 300 MG capsule Commonly known as: CLEOCIN 4 tablets prior to dental work   fenofibrate 145 MG tablet Commonly known as: TRICOR Take 1 tablet by mouth once daily   glipiZIDE 5 MG tablet Commonly known as: GLUCOTROL TAKE 1 TABLET BY MOUTH TWICE DAILY BEFORE MEAL(S)   losartan 100 MG tablet Commonly known as: COZAAR Take 1 tablet (100 mg  total) by mouth daily.   omeprazole 20 MG capsule Commonly known as: PRILOSEC Take 1 capsule by mouth once daily   PARoxetine 30 MG tablet Commonly known as: PAXIL Take 1 tablet by mouth once daily   propranolol 10 MG tablet Commonly known as: INDERAL Take 1 tablet (10 mg total) by mouth 3 (three) times daily as needed.         OBJECTIVE:   PHYSICAL EXAM: VS: BP 118/62 (BP Location: Left Arm, Patient Position: Sitting, Cuff Size: Large)   Pulse 87   Temp 98.7 F (37.1 C)   Ht 5\' 4"  (1.626 m)   Wt 224 lb 9.6 oz (101.9 kg)   SpO2 98%   BMI 38.55 kg/m    EXAM: General: Pt appears well and is in NAD  Neck: General: Supple without adenopathy. Thyroid: Thyroid size normal.  No goiter or nodules appreciated. No thyroid bruit.  Lungs: Clear with good BS bilat with no rales, rhonchi, or wheezes  Heart: Auscultation: RRR.  Abdomen: Normoactive bowel sounds, soft, nontender, without masses or organomegaly palpable  Extremities:  BL LE: No pretibial edema normal ROM and strength.  Skin: Hair: Texture and amount normal with gender appropriate distribution Skin Inspection: No rashes Skin Palpation: Skin temperature, texture, and thickness normal to palpation  Neuro: Cranial nerves: II - XII grossly intact  Motor: Normal strength throughout DTRs: 2+ and symmetric in UE without delay in relaxation phase  Mental Status: Judgment, insight: Intact Orientation: Oriented to time, place, and person Mood and affect: No depression, anxiety, or agitation     DATA REVIEWED:  Results for IZAMAR, DELIS (MRN [id number]) as of 08/30/2019 11:45  Ref. Range 08/29/2019 11:16  Sodium Latest Ref Range: 135 - 145 mEq/L 136  Potassium Latest Ref Range: 3.5 - 5.1 mEq/L 4.0  Chloride Latest Ref Range: 96 - 112 mEq/L 101  CO2 Latest Ref Range: 19 - 32 mEq/L 27  Glucose Latest Ref Range: 70 - 99 mg/dL 118 (H)  BUN Latest Ref Range: 6 - 23 mg/dL 23  Creatinine Latest Ref Range: 0.40 - 1.20  mg/dL 1.27 (H)  Calcium Latest Ref Range: 8.4 - 10.5 mg/dL 10.8 (H)  Albumin Latest Ref Range: 3.5 - 5.2 g/dL 4.4  GFR Latest Ref Range: >60.00 mL/min 41.52 (L)  VITD Latest Ref Range: 30.00 - 100.00 ng/mL 23.25 (L)     ASSESSMENT / PLAN / RECOMMENDATIONS:   1. Primary Hyperparathyroidism:  - Corrected calcium level is normal at 10.48 mg/dL , PTH- pending  - We discussed surgical intervention as an option  in the future - Encouraged hydration  - AVOID CALCIUM SUPPLEMENTS, AVOID LOW CALCIUM DIET - Maintain normal dietary calcium intake (2-3 servings of dairy a day)  2. Vitamin D Insufficiency:   - Will increase to 3000 iu daily     F/U in 6 months     Signed electronically by: Mack Guise, MD  [facility]  [address] [place], [place] [address] Phone: [phone] FAX: [phone]      CC: Lauree Chandler, NP [place] [address] Phone: [phone]  Fax: [phone]   Return to Endocrinology clinic as below: Future Appointments  Date Time Provider [place]  10/21/2019  9:00 AM [facility] None  10/23/2019 10:00 AM Lauree Chandler, NP [facility] None  03/05/2020 10:30 AM Tyshana Nishida, Melanie Crazier, MD [facility] None  04/22/2020 10:00 AM Lauree Chandler, NP [facility] None

## 2019-08-30 DIAGNOSIS — E559 Vitamin D deficiency, unspecified: Secondary | ICD-10-CM | POA: Insufficient documentation

## 2019-08-30 DIAGNOSIS — E210 Primary hyperparathyroidism: Secondary | ICD-10-CM | POA: Insufficient documentation

## 2019-08-30 DIAGNOSIS — E213 Hyperparathyroidism, unspecified: Secondary | ICD-10-CM | POA: Insufficient documentation

## 2019-08-30 LAB — PARATHYROID HORMONE, INTACT (NO CA): PTH: 45 pg/mL (ref 14–64)

## 2019-09-02 DIAGNOSIS — E119 Type 2 diabetes mellitus without complications: Secondary | ICD-10-CM | POA: Diagnosis not present

## 2019-09-02 DIAGNOSIS — H18523 Epithelial (juvenile) corneal dystrophy, bilateral: Secondary | ICD-10-CM | POA: Diagnosis not present

## 2019-09-02 DIAGNOSIS — H43813 Vitreous degeneration, bilateral: Secondary | ICD-10-CM | POA: Diagnosis not present

## 2019-09-02 LAB — HM DIABETES EYE EXAM

## 2019-09-03 ENCOUNTER — Encounter: Payer: Self-pay | Admitting: Internal Medicine

## 2019-09-29 ENCOUNTER — Other Ambulatory Visit: Payer: Self-pay | Admitting: Nurse Practitioner

## 2019-10-07 ENCOUNTER — Other Ambulatory Visit: Payer: Self-pay | Admitting: Nurse Practitioner

## 2019-10-07 DIAGNOSIS — E119 Type 2 diabetes mellitus without complications: Secondary | ICD-10-CM

## 2019-10-21 ENCOUNTER — Other Ambulatory Visit: Payer: Self-pay

## 2019-10-21 ENCOUNTER — Other Ambulatory Visit: Payer: Medicare Other

## 2019-10-21 DIAGNOSIS — E785 Hyperlipidemia, unspecified: Secondary | ICD-10-CM

## 2019-10-21 DIAGNOSIS — E1140 Type 2 diabetes mellitus with diabetic neuropathy, unspecified: Secondary | ICD-10-CM

## 2019-10-22 LAB — CBC WITH DIFFERENTIAL/PLATELET
Absolute Monocytes: 329 {cells}/uL (ref 200–950)
Basophils Absolute: 50 {cells}/uL (ref 0–200)
Basophils Relative: 0.8 %
Eosinophils Absolute: 112 {cells}/uL (ref 15–500)
Eosinophils Relative: 1.8 %
HCT: 41.5 % (ref 35.0–45.0)
Hemoglobin: 13.7 g/dL (ref 11.7–15.5)
Lymphs Abs: 2275 {cells}/uL (ref 850–3900)
MCH: 28.7 pg (ref 27.0–33.0)
MCHC: 33 g/dL (ref 32.0–36.0)
MCV: 86.8 fL (ref 80.0–100.0)
MPV: 9.9 fL (ref 7.5–12.5)
Monocytes Relative: 5.3 %
Neutro Abs: 3435 {cells}/uL (ref 1500–7800)
Neutrophils Relative %: 55.4 %
Platelets: 275 Thousand/uL (ref 140–400)
RBC: 4.78 Million/uL (ref 3.80–5.10)
RDW: 12.6 % (ref 11.0–15.0)
Total Lymphocyte: 36.7 %
WBC: 6.2 Thousand/uL (ref 3.8–10.8)

## 2019-10-22 LAB — LIPID PANEL
Cholesterol: 141 mg/dL (ref <200)
HDL: 46 mg/dL — ABNORMAL LOW (ref >=50)
LDL Cholesterol (Calc): 75 mg/dL (calc)
Non-HDL Cholesterol (Calc): 95 mg/dL (calc) (ref <130)
Total CHOL/HDL Ratio: 3.1 (calc) (ref <5.0)
Triglycerides: 115 mg/dL (ref <150)

## 2019-10-22 LAB — COMPLETE METABOLIC PANEL WITHOUT GFR
AG Ratio: 1.6 (calc) (ref 1.0–2.5)
ALT: 15 U/L (ref 6–29)
AST: 17 U/L (ref 10–35)
Albumin: 4.4 g/dL (ref 3.6–5.1)
Alkaline phosphatase (APISO): 49 U/L (ref 37–153)
BUN/Creatinine Ratio: 14 (calc) (ref 6–22)
BUN: 18 mg/dL (ref 7–25)
CO2: 23 mmol/L (ref 20–32)
Calcium: 10.8 mg/dL — ABNORMAL HIGH (ref 8.6–10.4)
Chloride: 105 mmol/L (ref 98–110)
Creat: 1.32 mg/dL — ABNORMAL HIGH (ref 0.60–0.93)
GFR, Est African American: 47 mL/min/1.73m2 — ABNORMAL LOW
GFR, Est Non African American: 41 mL/min/1.73m2 — ABNORMAL LOW
Globulin: 2.7 g/dL (ref 1.9–3.7)
Glucose, Bld: 120 mg/dL — ABNORMAL HIGH (ref 65–99)
Potassium: 4.3 mmol/L (ref 3.5–5.3)
Sodium: 137 mmol/L (ref 135–146)
Total Bilirubin: 0.7 mg/dL (ref 0.2–1.2)
Total Protein: 7.1 g/dL (ref 6.1–8.1)

## 2019-10-22 LAB — HEMOGLOBIN A1C
Hgb A1c MFr Bld: 6.4 %{Hb} — ABNORMAL HIGH
Mean Plasma Glucose: 137 (calc)
eAG (mmol/L): 7.6 (calc)

## 2019-10-23 ENCOUNTER — Encounter: Payer: Self-pay | Admitting: Nurse Practitioner

## 2019-10-23 ENCOUNTER — Other Ambulatory Visit: Payer: Self-pay

## 2019-10-23 ENCOUNTER — Ambulatory Visit (INDEPENDENT_AMBULATORY_CARE_PROVIDER_SITE_OTHER): Payer: Medicare Other | Admitting: Nurse Practitioner

## 2019-10-23 VITALS — BP 148/90 | HR 82 | Temp 96.8°F | Ht 64.0 in | Wt 222.0 lb

## 2019-10-23 DIAGNOSIS — J3489 Other specified disorders of nose and nasal sinuses: Secondary | ICD-10-CM | POA: Diagnosis not present

## 2019-10-23 DIAGNOSIS — K219 Gastro-esophageal reflux disease without esophagitis: Secondary | ICD-10-CM

## 2019-10-23 DIAGNOSIS — R42 Dizziness and giddiness: Secondary | ICD-10-CM | POA: Diagnosis not present

## 2019-10-23 DIAGNOSIS — E1140 Type 2 diabetes mellitus with diabetic neuropathy, unspecified: Secondary | ICD-10-CM

## 2019-10-23 DIAGNOSIS — M549 Dorsalgia, unspecified: Secondary | ICD-10-CM | POA: Diagnosis not present

## 2019-10-23 DIAGNOSIS — R002 Palpitations: Secondary | ICD-10-CM | POA: Diagnosis not present

## 2019-10-23 DIAGNOSIS — E1122 Type 2 diabetes mellitus with diabetic chronic kidney disease: Secondary | ICD-10-CM

## 2019-10-23 DIAGNOSIS — E785 Hyperlipidemia, unspecified: Secondary | ICD-10-CM | POA: Diagnosis not present

## 2019-10-23 DIAGNOSIS — N183 Chronic kidney disease, stage 3 unspecified: Secondary | ICD-10-CM | POA: Diagnosis not present

## 2019-10-23 DIAGNOSIS — M62838 Other muscle spasm: Secondary | ICD-10-CM | POA: Diagnosis not present

## 2019-10-23 DIAGNOSIS — E210 Primary hyperparathyroidism: Secondary | ICD-10-CM | POA: Diagnosis not present

## 2019-10-23 DIAGNOSIS — I10 Essential (primary) hypertension: Secondary | ICD-10-CM | POA: Diagnosis not present

## 2019-10-23 MED ORDER — OMEPRAZOLE 20 MG PO CPDR: 20.0000 mg | DELAYED_RELEASE_CAPSULE | Freq: Every day | ORAL | 3 refills | Status: DC

## 2019-10-23 MED ORDER — AMLODIPINE BESYLATE 5 MG PO TABS: 5.0000 mg | ORAL_TABLET | Freq: Every day | ORAL | 3 refills | Status: DC

## 2019-10-23 MED ORDER — FENOFIBRATE 145 MG PO TABS: 145.0000 mg | ORAL_TABLET | Freq: Every day | ORAL | 1 refills | Status: DC

## 2019-10-23 NOTE — Patient Instructions (Signed)
TDAP or TD booster- to get at pharmacy

## 2019-10-23 NOTE — Progress Notes (Signed)
Careteam: Patient Care Team: Lauree Chandler, NP as PCP - General (Geriatric Medicine) Minus Breeding, MD as PCP - Cardiology (Cardiology) Mincey, Neena Rhymes, MD as Consulting Physician (Ophthalmology)  PLACE OF SERVICE:  [place] Directive information Does Patient Have a Medical Advance Directive?: Yes, Type of Advance Directive: [place];Living will, Does patient want to make changes to medical advance directive?: No - Patient declined  Allergies  Allergen Reactions   Penicillins     Rash    Ivp Dye [Iodinated Diagnostic Agents]     Hives    Percocet [Oxycodone-Acetaminophen]     Itching     Chief Complaint  Patient presents with   Medical Management of Chronic Issues    6 month follow-up and discuss labs (copy printed)    Dizziness    Ongoing concerns, more constant now   Parathyroid Issues    Discuss parathyroid issues    Sinus Problem    Discuss sinus issues    Immunizations    Discuss need for TD   Quality Metric Gaps    Discuss need for eye exam      HPI: Patient is a 69 y.o. female for routine follow up.   Followed by endocrinology due to her hyperparathyroid- planned to get in touch with surgery however then noted albumin was high which causes calcium to look high.  Reports worsening muscle spasms and cramps.   Reports dizziness in constant- every day has to sit down and hold on to something. Has to wait until it passes, reports double vision for a few seconds.   Has ongoing sinus issues- pressure on left side with bad headache. Can not tolerate netty pot. Issue has just gotten worse over the last month. No itchy eyes, reports post nasal drip with sore throat. She has been taking allegra daily for several months but did not help.   htn- blood pressure elevated today, has been off HCTZ and now bp is up. Had to come off for testing for hyperparathyroid   Palpitations- stable at this time, continues to follow  up with cardiologist.   Vit d- improving level, continues on supplement    DM- had diabetic eye exam in May No low blood sugars. A1c stable.   Hyperlipidemia- LDL 75, ideally to be on statin due to diabetes. Continues on fenofibrate   Review of Systems:  Review of Systems  Constitutional: Negative for chills, fever, malaise/fatigue and weight loss.  HENT: Positive for congestion, ear pain, sinus pain and sore throat.   Eyes: Positive for double vision (lasting a few seconds associated with vertigo).  Respiratory: Negative for cough and shortness of breath.   Cardiovascular: Positive for palpitations (occasional, has not needed PRN). Negative for chest pain.  Gastrointestinal: Negative for abdominal pain, constipation, diarrhea and heartburn.  Genitourinary: Negative for dysuria, frequency and urgency.  Musculoskeletal: Positive for back pain and myalgias (muscle cramps). Negative for joint pain and neck pain.  Neurological: Positive for dizziness. Negative for tingling, sensory change and headaches.    Past Medical History:  Diagnosis Date   Arthritis    Cancer ([facility])    melanoma- right leg   Depression    GERD (gastroesophageal reflux disease)    History of hiatal hernia    Hypercholesterolemia    Hypertension    Sweat, sweating, excessive    Type 2 diabetes mellitus ([facility])    Past Surgical History:  Procedure Laterality Date   ABDOMINAL HYSTERECTOMY  1979  APPENDECTOMY  1964   BREAST EXCISIONAL BIOPSY Left    benign   BREAST LUMPECTOMY WITH RADIOACTIVE SEED LOCALIZATION Left 12/31/2014   Procedure: LEFT BREAST LUMPECTOMY WITH RADIOACTIVE SEED LOCALIZATION;  Surgeon: Donnie Mesa, MD;  Location: [facility];  Service: General;  Laterality: Left;   CARPAL TUNNEL RELEASE Right 1977   CARPAL TUNNEL RELEASE Left 2007   CATARACT EXTRACTION Left 04/13/2014   Dr. Herbert Deaner   CATARACT EXTRACTION Right 06/10/2014   Dr. Herbert Deaner   CHOLECYSTECTOMY N/A  07/14/2017   Procedure: LAPAROSCOPIC CHOLECYSTECTOMY;  Surgeon: Ileana Roup, MD;  Location: [facility];  Service: General;  Laterality: N/A;   COLONOSCOPY  2009   [place] ARTHROPLASTY Left 2012   TOTAL KNEE ARTHROPLASTY Right 2008   Social History:   reports that she quit smoking about 33 years ago. Her smoking use included cigarettes. She quit after 15.00 years of use. She has never used smokeless tobacco. She reports current alcohol use. She reports that she does not use drugs.  Family History  Problem Relation Age of Onset   Heart attack Father        Died of MI age 41   Pulmonary embolism Mother        Multiple, B/L    Hypertension Brother    Heart disease Brother 90   Diabetes Brother    High Cholesterol Brother    Hypertension Brother    High Cholesterol Brother    Hypertension Sister    Diabetes type II Sister    High Cholesterol Sister    Alcohol abuse Brother    Reye's syndrome Daughter    Hepatitis Brother    Stroke Other    Cancer Maternal Grandmother 66   Colon cancer Neg Hx    Esophageal cancer Neg Hx    Stomach cancer Neg Hx    Rectal cancer Neg Hx     Medications: Patient's Medications  New Prescriptions   No medications on file  Previous Medications   CHOLECALCIFEROL (VITAMIN D3) 25 MCG (1000 UT) CAPS    Take 1 capsule by mouth daily.   CHOLECALCIFEROL (VITAMIN D3) 50 MCG (2000 UT) TABS    Take 1 tablet by mouth daily.   CLINDAMYCIN (CLEOCIN) 300 MG CAPSULE    4 tablets prior to dental work   FENOFIBRATE (TRICOR) 145 MG TABLET    Take 1 tablet by mouth once daily   GLIPIZIDE (GLUCOTROL) 5 MG TABLET    TAKE 1 TABLET BY MOUTH TWICE DAILY BEFORE MEAL(S)   LOSARTAN (COZAAR) 100 MG TABLET    Take 1 tablet (100 mg total) by mouth daily.   OMEPRAZOLE (PRILOSEC) 20 MG CAPSULE    Take 1 capsule by mouth once daily   PAROXETINE (PAXIL) 30 MG TABLET    Take 1 tablet  by mouth once daily   PROPRANOLOL (INDERAL) 10 MG TABLET    Take 1 tablet (10 mg total) by mouth 3 (three) times daily as needed.  Modified Medications   No medications on file  Discontinued Medications   No medications on file    Physical Exam:  Vitals:   10/23/19 1014  BP: (!) 148/90  Pulse: 82  Temp: (!) 96.8 F (36 C)  TempSrc: Temporal  SpO2: 98%  Weight: 222 lb (100.7 kg)  Height: 5\' 4"  (1.626 m)   Body mass index is 38.11 kg/m. Wt Readings from Last  3 Encounters:  10/23/19 222 lb (100.7 kg)  08/29/19 224 lb 9.6 oz (101.9 kg)  04/30/19 231 lb 12.8 oz (105.1 kg)    Physical Exam Constitutional:      General: She is not in acute distress.    Appearance: She is well-developed. She is not diaphoretic.  HENT:     Head: Normocephalic and atraumatic.     Right Ear: Ear canal normal. A middle ear effusion is present. Tympanic membrane is not scarred, perforated or erythematous.     Left Ear: Ear canal normal. A middle ear effusion is present. Tympanic membrane is not scarred, perforated or erythematous.     Mouth/Throat:     Pharynx: No oropharyngeal exudate.  Eyes:     Conjunctiva/sclera: Conjunctivae normal.     Pupils: Pupils are equal, round, and reactive to light.  Cardiovascular:     Rate and Rhythm: Normal rate and regular rhythm.     Heart sounds: Normal heart sounds.  Pulmonary:     Effort: Pulmonary effort is normal.     Breath sounds: Normal breath sounds.  Abdominal:     General: Bowel sounds are normal.     Palpations: Abdomen is soft.  Musculoskeletal:        General: No tenderness.     Cervical back: Normal range of motion and neck supple.  Skin:    General: Skin is warm and dry.  Neurological:     Mental Status: She is alert and oriented to person, place, and time.     Labs reviewed: Basic Metabolic Panel: Recent Labs    07/16/19 1030 08/29/19 1116 10/21/19 0901  NA 136 136 137  K 4.2 4.0 4.3  CL 103 101 105  CO2 26 27 23   GLUCOSE  131* 118* 120*  BUN 21 23 18   CREATININE 1.18 1.27* 1.32*  CALCIUM 10.6* 10.8* 10.8*   Liver Function Tests: Recent Labs    04/17/19 0856 07/16/19 1030 08/29/19 1116 10/21/19 0901  AST 20  --   --  17  ALT 21  --   --  15  BILITOT 0.4  --   --  0.7  PROT 7.2  --   --  7.1  ALBUMIN  --  4.3 4.4  --    No results for input(s): LIPASE, AMYLASE in the last 8760 hours. No results for input(s): AMMONIA in the last 8760 hours. CBC: Recent Labs    04/17/19 0856 10/21/19 0901  WBC 5.7 6.2  NEUTROABS 3,067 3,435  HGB 14.2 13.7  HCT 43.1 41.5  MCV 88.0 86.8  PLT 274 275   Lipid Panel: Recent Labs    04/17/19 0856 10/21/19 0901  CHOL 149 141  HDL 41* 46*  LDLCALC 84 75  TRIG 137 115  CHOLHDL 3.6 3.1   TSH: No results for input(s): TSH in the last 8760 hours. A1C: Lab Results  Component Value Date   HGBA1C 6.4 (H) 10/21/2019     Assessment/Plan 1. Type 2 diabetes mellitus with diabetic neuropathy, without long-term current use of insulin (HCC) -controlled on glipizide 5 mg twice daily with meals. No hypoglycemia. Encouraged dietary compliance, routine foot care/monitoring and to keep up with diabetic eye exams through ophthalmology     2. Dizziness Ongoing, more persist symptoms recently.  - Ambulatory referral to ENT  3. Sinus pressure Ongoing, allegra has not been helpful, unable to use netipot, only with increase in sinus pressure on right side.  - Ambulatory referral to ENT for further  evaluation and treatment  4. Palpitations -stable. Has not needed PRN propanolol. Ongoing follow up with cardiology   5. Primary hyperparathyroidism ([place]) -recent PTH in normal range with corrected calcium per endocrinology, calcium is unchanged at this time. Continues with follow up to endocrinology.   6. Hyperlipidemia with target LDL less than 70 LDL 75, goal less than 70 due to diabetes, continues on fenofibrate with dietary modifications  - fenofibrate (TRICOR) 145  MG tablet; Take 1 tablet (145 mg total) by mouth daily.  Dispense: 90 tablet; Refill: 1  7. Gastroesophageal reflux disease without esophagitis Stable on omeprazole - omeprazole (PRILOSEC) 20 MG capsule; Take 1 capsule (20 mg total) by mouth daily.  Dispense: 90 capsule; Refill: 3  8. Muscle spasm -possible related to elevated calcium, being followed by endocrinology for hyperparathyroid. Will get blood work to evaluate further. She is not on a statin. Otherwise labs unremarkable.  - C-reactive Protein - Sedimentation Rate - CK  9. Bilateral back pain, unspecified back location, unspecified chronicity Reports pain in the area of her mid back bilaterally, unable to reproduce- states it feels like theres tenderness "inside" - C-reactive Protein - Sedimentation Rate - CK  10. Essential hypertension, benign Elevated since stopping hydrochlorothiazide.  - amLODipine (NORVASC) 5 MG tablet; Take 1 tablet (5 mg total) by mouth daily.  Dispense: 90 tablet; Refill: 3  11. 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 Avoiding nephrotoxic medication and NSAIDs  Next appt: 3 months, sooner if needed  Mallissa Lorenzen K. [place], [facility] [phone]

## 2019-10-25 LAB — CK: Total CK: 40 U/L (ref 29–143)

## 2019-10-25 LAB — C-REACTIVE PROTEIN: CRP: 9.9 mg/L — ABNORMAL HIGH (ref <8.0)

## 2019-10-25 LAB — MAGNESIUM: Magnesium: 2.1 mg/dL (ref 1.5–2.5)

## 2019-10-25 LAB — TEST AUTHORIZATION

## 2019-10-25 LAB — SEDIMENTATION RATE: Sed Rate: 6 mm/h (ref 0–30)

## 2019-11-11 ENCOUNTER — Other Ambulatory Visit: Payer: Self-pay

## 2019-11-11 ENCOUNTER — Other Ambulatory Visit (INDEPENDENT_AMBULATORY_CARE_PROVIDER_SITE_OTHER): Payer: Self-pay

## 2019-11-11 ENCOUNTER — Encounter (INDEPENDENT_AMBULATORY_CARE_PROVIDER_SITE_OTHER): Payer: Self-pay | Admitting: Otolaryngology

## 2019-11-11 ENCOUNTER — Ambulatory Visit (INDEPENDENT_AMBULATORY_CARE_PROVIDER_SITE_OTHER): Payer: Medicare Other | Admitting: Otolaryngology

## 2019-11-11 VITALS — Temp 94.8°F

## 2019-11-11 DIAGNOSIS — J310 Chronic rhinitis: Secondary | ICD-10-CM

## 2019-11-11 DIAGNOSIS — R42 Dizziness and giddiness: Secondary | ICD-10-CM | POA: Diagnosis not present

## 2019-11-11 DIAGNOSIS — J3489 Other specified disorders of nose and nasal sinuses: Secondary | ICD-10-CM

## 2019-11-11 NOTE — Progress Notes (Addendum)
HPI: Jill Garza is a 69 y.o. female who presents is referred by Sherrie Mustache,, NP for evaluation of dizziness and sinus problems. She has occasional headaches. She describes more pressure and sinus problems on the left side of her nose than the right. However her nose congestion alternates from side to side. But the left side seems to have more pressure and headaches. She describes dizziness when she turns her head abruptly but does not really describe vertigo or spinning sensation. She has noticed longstanding hearing problems more on the left side for several years. She has had no ear pain. She has used antihistamines previously without much benefit. She occasionally uses Flonase but not regularly..  Past Medical History:  Diagnosis Date  . Arthritis   . Cancer ([place])    melanoma- right leg  . Depression   . GERD (gastroesophageal reflux disease)   . History of hiatal hernia   . Hypercholesterolemia   . Hypertension   . Sweat, sweating, excessive   . Type 2 diabetes mellitus ([place])    Past Surgical History:  Procedure Laterality Date  . ABDOMINAL HYSTERECTOMY  1979  . APPENDECTOMY  1964  . BREAST EXCISIONAL BIOPSY Left    benign  . BREAST LUMPECTOMY WITH RADIOACTIVE SEED LOCALIZATION Left 12/31/2014   Procedure: LEFT BREAST LUMPECTOMY WITH RADIOACTIVE SEED LOCALIZATION;  Surgeon: Donnie Mesa, MD;  Location: [facility];  Service: General;  Laterality: Left;  . CARPAL TUNNEL RELEASE Right 1977  . CARPAL TUNNEL RELEASE Left 2007  . CATARACT EXTRACTION Left 04/13/2014   Dr. Herbert Deaner  . CATARACT EXTRACTION Right 06/10/2014   Dr. Herbert Deaner  . CHOLECYSTECTOMY N/A 07/14/2017   Procedure: LAPAROSCOPIC CHOLECYSTECTOMY;  Surgeon: Ileana Roup, MD;  Location: [facility];  Service: General;  Laterality: N/A;  . COLONOSCOPY  2009  . JOINT REPLACEMENT    . MELANOMA EXCISION    . TONSILLECTOMY  1962  . TOTAL KNEE ARTHROPLASTY Left 2012  . TOTAL KNEE ARTHROPLASTY Right 2008    Social History   Socioeconomic History  . Marital status: Married    Spouse name: Not on file  . Number of children: Not on file  . Years of education: Not on file  . Highest education level: Not on file  Occupational History  . Not on file  Tobacco Use  . Smoking status: Former Smoker    Packs/day: 1.00    Years: 15.00    Pack years: 15.00    Types: Cigarettes    Quit date: 05/09/1986    Years since quitting: 33.5  . Smokeless tobacco: Never Used  Vaping Use  . Vaping Use: Never used  Substance and Sexual Activity  . Alcohol use: Yes    Alcohol/week: 0.0 standard drinks    Comment: Vacation only, couple times yearly   . Drug use: No  . Sexual activity: Yes  Other Topics Concern  . Not on file  Social History Narrative  . Not on file   Social Determinants of Health   Financial Resource Strain:   . Difficulty of Paying Living Expenses:   Food Insecurity:   . Worried About Charity fundraiser in the Last Year:   . Arboriculturist in the Last Year:   Transportation Needs:   . Film/video editor (Medical):   Marland Kitchen Lack of Transportation (Non-Medical):   Physical Activity:   . Days of Exercise per Week:   . Minutes of Exercise per Session:   Stress:   .  Feeling of Stress :   Social Connections:   . Frequency of Communication with Friends and Family:   . Frequency of Social Gatherings with Friends and Family:   . Attends Religious Services:   . Active Member of Clubs or Organizations:   . Attends Archivist Meetings:   Marland Kitchen Marital Status:    Family History  Problem Relation Age of Onset  . Heart attack Father        Died of MI age 81  . Pulmonary embolism Mother        Multiple, B/L   . Hypertension Brother   . Heart disease Brother 44  . Diabetes Brother   . High Cholesterol Brother   . Hypertension Brother   . High Cholesterol Brother   . Hypertension Sister   . Diabetes type II Sister   . High Cholesterol Sister   . Alcohol abuse Brother    . Reye's syndrome Daughter   . Hepatitis Brother   . Stroke Other   . Cancer Maternal Grandmother 57  . Colon cancer Neg Hx   . Esophageal cancer Neg Hx   . Stomach cancer Neg Hx   . Rectal cancer Neg Hx    Allergies  Allergen Reactions  . Penicillins     Rash   . Ivp Dye [Iodinated Diagnostic Agents]     Hives   . Percocet [Oxycodone-Acetaminophen]     Itching    Prior to Admission medications   Medication Sig Start Date End Date Taking? Authorizing Provider  amLODipine (NORVASC) 5 MG tablet Take 1 tablet (5 mg total) by mouth daily. 10/23/19  Yes Lauree Chandler, NP  Cholecalciferol (VITAMIN D3) 25 MCG (1000 UT) CAPS Take 1 capsule by mouth daily.   Yes [provider]  Cholecalciferol (VITAMIN D3) 50 MCG (2000 UT) TABS Take 1 tablet by mouth daily.   Yes [provider]  clindamycin (CLEOCIN) 300 MG capsule 4 tablets prior to dental work   Yes [provider]  fenofibrate (TRICOR) 145 MG tablet Take 1 tablet (145 mg total) by mouth daily. 10/23/19  Yes Lauree Chandler, NP  glipiZIDE (GLUCOTROL) 5 MG tablet TAKE 1 TABLET BY MOUTH TWICE DAILY BEFORE MEAL(S) 10/08/19  Yes Lauree Chandler, NP  losartan (COZAAR) 100 MG tablet Take 1 tablet (100 mg total) by mouth daily. 05/02/19  Yes Lauree Chandler, NP  omeprazole (PRILOSEC) 20 MG capsule Take 1 capsule (20 mg total) by mouth daily. 10/23/19  Yes Lauree Chandler, NP  PARoxetine (PAXIL) 30 MG tablet Take 1 tablet by mouth once daily 05/27/19  Yes Eubanks, Carlos American, NP     Positive ROS: Otherwise negative  All other systems have been reviewed and were otherwise negative with the exception of those mentioned in the HPI and as above.  Physical Exam: Constitutional: Alert, well-appearing, no acute distress Ears: External ears without lesions or tenderness. Ear canals are clear bilaterally. Both TMs are clear with good mobility on pneumatic otoscopy. Hearing screening with a 1024 tuning fork  revealed slightly diminished hearing in the left ear compared to the right. Nasal: External nose without lesions. Septum mildly deviated to the left. Of note she has scar tissue between the septum and the inferior turbinate about 2 cm posteriorly from the nostril. The middle meatus regions were clear bilaterally with no polyps no mucopurulent discharge and minimal edema.. Clear nasal passages otherwise. Oral: Lips and gums without lesions. Tongue and palate mucosa without lesions. Posterior oropharynx  clear. Neck: No palpable adenopathy or masses Respiratory: Breathing comfortably  Skin: No facial/neck lesions or rash noted.  Procedures  Assessment: Chronic rhinitis. Scar tissue on the left side from the inferior turbinate to the septum most likely related to previous bad nasal sinus infection as she denies any history of trauma to the nose or packing placed in the nose. Dizziness which is not typical of vertigo or vestibular abnormality.  Plan: Prescribed regular use of Nasacort or Flonase 2 sprays each nostril at night for the next month as well as use of saline irrigation when she notices much mucus buildup in the nose. We will schedule a CT scan of the sinuses to better evaluate the sinuses to see if she has any obstruction of the sinuses as she does have some scar tissue on the left intranasal cavity. We will also schedule VNG testing and audiogram logic testing as she notices some mild hearing loss on the left side. She will follow up following the above test to review the CT scan and inner ear testing.  Radene Journey, MD   CC:

## 2019-11-15 ENCOUNTER — Other Ambulatory Visit: Payer: Self-pay | Admitting: Nurse Practitioner

## 2019-11-15 DIAGNOSIS — Z1231 Encounter for screening mammogram for malignant neoplasm of breast: Secondary | ICD-10-CM

## 2019-11-25 ENCOUNTER — Other Ambulatory Visit: Payer: Self-pay | Admitting: Nurse Practitioner

## 2019-11-28 ENCOUNTER — Other Ambulatory Visit: Payer: Self-pay

## 2019-11-28 ENCOUNTER — Ambulatory Visit
Admission: RE | Admit: 2019-11-28 | Discharge: 2019-11-28 | Disposition: A | Discharge status: Home / Self Care | Payer: Medicare Other | Source: Ambulatory Visit | Attending: Otolaryngology | Admitting: Otolaryngology

## 2019-11-28 DIAGNOSIS — J342 Deviated nasal septum: Secondary | ICD-10-CM | POA: Diagnosis not present

## 2019-11-28 DIAGNOSIS — J321 Chronic frontal sinusitis: Secondary | ICD-10-CM | POA: Diagnosis not present

## 2019-11-28 DIAGNOSIS — J3489 Other specified disorders of nose and nasal sinuses: Secondary | ICD-10-CM

## 2019-12-09 DIAGNOSIS — R42 Dizziness and giddiness: Secondary | ICD-10-CM | POA: Diagnosis not present

## 2019-12-09 DIAGNOSIS — H903 Sensorineural hearing loss, bilateral: Secondary | ICD-10-CM | POA: Diagnosis not present

## 2019-12-16 ENCOUNTER — Other Ambulatory Visit: Payer: Self-pay

## 2019-12-16 ENCOUNTER — Encounter (INDEPENDENT_AMBULATORY_CARE_PROVIDER_SITE_OTHER): Payer: Self-pay | Admitting: Otolaryngology

## 2019-12-16 ENCOUNTER — Ambulatory Visit (INDEPENDENT_AMBULATORY_CARE_PROVIDER_SITE_OTHER): Payer: Medicare Other | Admitting: Otolaryngology

## 2019-12-16 VITALS — Temp 93.7°F

## 2019-12-16 DIAGNOSIS — R42 Dizziness and giddiness: Secondary | ICD-10-CM | POA: Diagnosis not present

## 2019-12-16 DIAGNOSIS — H903 Sensorineural hearing loss, bilateral: Secondary | ICD-10-CM | POA: Diagnosis not present

## 2019-12-16 NOTE — Progress Notes (Signed)
HPI: Pearson Reasons is a 69 y.o. female who returns today for evaluation of recurrent dizziness.  She is also noted decline in her hearing over years.  She underwent VNG testing and audiologic testing last week and presents today to review these test.  The hearing test demonstrated a mild symmetric bilateral sensorineural hearing loss with SRT's of 30 dB.  Tympanograms revealed type A tympanograms bilaterally. Reviewed the VNG testing and this demonstrated normal VNG testing with no evidence of vestibular abnormality or BPPV.Marland Kitchen  Past Medical History:  Diagnosis Date  . Arthritis   . Cancer ([place])    melanoma- right leg  . Depression   . GERD (gastroesophageal reflux disease)   . History of hiatal hernia   . Hypercholesterolemia   . Hypertension   . Sweat, sweating, excessive   . Type 2 diabetes mellitus ([place])    Past Surgical History:  Procedure Laterality Date  . ABDOMINAL HYSTERECTOMY  1979  . APPENDECTOMY  1964  . BREAST EXCISIONAL BIOPSY Left    benign  . BREAST LUMPECTOMY WITH RADIOACTIVE SEED LOCALIZATION Left 12/31/2014   Procedure: LEFT BREAST LUMPECTOMY WITH RADIOACTIVE SEED LOCALIZATION;  Surgeon: Donnie Mesa, MD;  Location: [facility];  Service: General;  Laterality: Left;  . CARPAL TUNNEL RELEASE Right 1977  . CARPAL TUNNEL RELEASE Left 2007  . CATARACT EXTRACTION Left 04/13/2014   Dr. Herbert Deaner  . CATARACT EXTRACTION Right 06/10/2014   Dr. Herbert Deaner  . CHOLECYSTECTOMY N/A 07/14/2017   Procedure: LAPAROSCOPIC CHOLECYSTECTOMY;  Surgeon: Ileana Roup, MD;  Location: [facility];  Service: General;  Laterality: N/A;  . COLONOSCOPY  2009  . JOINT REPLACEMENT    . MELANOMA EXCISION    . TONSILLECTOMY  1962  . TOTAL KNEE ARTHROPLASTY Left 2012  . TOTAL KNEE ARTHROPLASTY Right 2008   Social History   Socioeconomic History  . Marital status: Married    Spouse name: Not on file  . Number of children: Not on file  . Years of education: Not on file  . Highest  education level: Not on file  Occupational History  . Not on file  Tobacco Use  . Smoking status: Former Smoker    Packs/day: 1.00    Years: 15.00    Pack years: 15.00    Types: Cigarettes    Quit date: 05/09/1986    Years since quitting: 33.6  . Smokeless tobacco: Never Used  Vaping Use  . Vaping Use: Never used  Substance and Sexual Activity  . Alcohol use: Yes    Alcohol/week: 0.0 standard drinks    Comment: Vacation only, couple times yearly   . Drug use: No  . Sexual activity: Yes  Other Topics Concern  . Not on file  Social History Narrative  . Not on file   Social Determinants of Health   Financial Resource Strain:   . Difficulty of Paying Living Expenses:   Food Insecurity:   . Worried About Charity fundraiser in the Last Year:   . Arboriculturist in the Last Year:   Transportation Needs:   . Film/video editor (Medical):   Marland Kitchen Lack of Transportation (Non-Medical):   Physical Activity:   . Days of Exercise per Week:   . Minutes of Exercise per Session:   Stress:   . Feeling of Stress :   Social Connections:   . Frequency of Communication with Friends and Family:   . Frequency of Social Gatherings with Friends and Family:   .  Attends Religious Services:   . Active Member of Clubs or Organizations:   . Attends Archivist Meetings:   Marland Kitchen Marital Status:    Family History  Problem Relation Age of Onset  . Heart attack Father        Died of MI age 29  . Pulmonary embolism Mother        Multiple, B/L   . Hypertension Brother   . Heart disease Brother 30  . Diabetes Brother   . High Cholesterol Brother   . Hypertension Brother   . High Cholesterol Brother   . Hypertension Sister   . Diabetes type II Sister   . High Cholesterol Sister   . Alcohol abuse Brother   . Reye's syndrome Daughter   . Hepatitis Brother   . Stroke Other   . Cancer Maternal Grandmother 44  . Colon cancer Neg Hx   . Esophageal cancer Neg Hx   . Stomach cancer Neg Hx    . Rectal cancer Neg Hx    Allergies  Allergen Reactions  . Penicillins     Rash   . Ivp Dye [Iodinated Diagnostic Agents]     Hives   . Percocet [Oxycodone-Acetaminophen]     Itching    Prior to Admission medications   Medication Sig Start Date End Date Taking? Authorizing Provider  amLODipine (NORVASC) 5 MG tablet Take 1 tablet (5 mg total) by mouth daily. 10/23/19  Yes Lauree Chandler, NP  Cholecalciferol (VITAMIN D3) 25 MCG (1000 UT) CAPS Take 1 capsule by mouth daily.   Yes [provider]  Cholecalciferol (VITAMIN D3) 50 MCG (2000 UT) TABS Take 1 tablet by mouth daily.   Yes [provider]  clindamycin (CLEOCIN) 300 MG capsule 4 tablets prior to dental work   Yes [provider]  fenofibrate (TRICOR) 145 MG tablet Take 1 tablet (145 mg total) by mouth daily. 10/23/19  Yes Lauree Chandler, NP  glipiZIDE (GLUCOTROL) 5 MG tablet TAKE 1 TABLET BY MOUTH TWICE DAILY BEFORE MEAL(S) 10/08/19  Yes Lauree Chandler, NP  losartan (COZAAR) 100 MG tablet Take 1 tablet (100 mg total) by mouth daily. 05/02/19  Yes Lauree Chandler, NP  omeprazole (PRILOSEC) 20 MG capsule Take 1 capsule (20 mg total) by mouth daily. 10/23/19  Yes Lauree Chandler, NP  PARoxetine (PAXIL) 30 MG tablet Take 1 tablet by mouth once daily 11/26/19  Yes Eubanks, Carlos American, NP     Positive ROS: Otherwise negative  All other systems have been reviewed and were otherwise negative with the exception of those mentioned in the HPI and as above.  Physical Exam: Constitutional: Alert, well-appearing, no acute distress Ears: External ears without lesions or tenderness. Ear canals are clear bilaterally with intact, clear TMs.  Nasal: External nose without lesions. Clear nasal passages Oral: Lips and gums without lesions. Tongue and palate mucosa without lesions. Posterior oropharynx clear. Neck: No palpable adenopathy or masses Respiratory: Breathing comfortably  Skin: No facial/neck  lesions or rash noted.  Procedures  Assessment: Dizziness questionable etiology.  VNG testing revealed no vestibular abnormalities or evidence of BPPV. Mild bilateral symmetric sensorineural hearing loss  Plan: Reviewed with her that she would be a candidate for hearing aids. Concerning her dizziness there was no inner ear abnormality noted on testing.  If she continues to have balance or dizzy problems consider referral to neurology.  She will follow up here as needed.   Radene Journey, MD

## 2019-12-19 ENCOUNTER — Encounter (INDEPENDENT_AMBULATORY_CARE_PROVIDER_SITE_OTHER): Payer: Self-pay

## 2019-12-22 DIAGNOSIS — Z20828 Contact with and (suspected) exposure to other viral communicable diseases: Secondary | ICD-10-CM | POA: Diagnosis not present

## 2019-12-27 ENCOUNTER — Ambulatory Visit
Admission: RE | Admit: 2019-12-27 | Discharge: 2019-12-27 | Disposition: A | Discharge status: Home / Self Care | Payer: Medicare Other | Source: Ambulatory Visit | Attending: Nurse Practitioner | Admitting: Nurse Practitioner

## 2019-12-27 ENCOUNTER — Other Ambulatory Visit: Payer: Self-pay

## 2019-12-27 DIAGNOSIS — Z1231 Encounter for screening mammogram for malignant neoplasm of breast: Secondary | ICD-10-CM | POA: Diagnosis not present

## 2020-01-01 ENCOUNTER — Other Ambulatory Visit: Payer: Self-pay | Admitting: Nurse Practitioner

## 2020-01-01 DIAGNOSIS — E119 Type 2 diabetes mellitus without complications: Secondary | ICD-10-CM

## 2020-01-06 DIAGNOSIS — L814 Other melanin hyperpigmentation: Secondary | ICD-10-CM | POA: Diagnosis not present

## 2020-01-06 DIAGNOSIS — D225 Melanocytic nevi of trunk: Secondary | ICD-10-CM | POA: Diagnosis not present

## 2020-01-06 DIAGNOSIS — D1801 Hemangioma of skin and subcutaneous tissue: Secondary | ICD-10-CM | POA: Diagnosis not present

## 2020-01-06 DIAGNOSIS — L905 Scar conditions and fibrosis of skin: Secondary | ICD-10-CM | POA: Diagnosis not present

## 2020-01-06 DIAGNOSIS — Z85820 Personal history of malignant melanoma of skin: Secondary | ICD-10-CM | POA: Diagnosis not present

## 2020-01-06 DIAGNOSIS — L821 Other seborrheic keratosis: Secondary | ICD-10-CM | POA: Diagnosis not present

## 2020-01-24 ENCOUNTER — Other Ambulatory Visit: Payer: Self-pay

## 2020-01-24 ENCOUNTER — Telehealth: Payer: Self-pay

## 2020-01-24 ENCOUNTER — Encounter: Payer: Self-pay | Admitting: Nurse Practitioner

## 2020-01-24 ENCOUNTER — Ambulatory Visit (INDEPENDENT_AMBULATORY_CARE_PROVIDER_SITE_OTHER): Payer: Medicare Other | Admitting: Nurse Practitioner

## 2020-01-24 VITALS — BP 138/74 | HR 86 | Temp 96.8°F | Ht 64.0 in | Wt 226.8 lb

## 2020-01-24 DIAGNOSIS — E785 Hyperlipidemia, unspecified: Secondary | ICD-10-CM

## 2020-01-24 DIAGNOSIS — E559 Vitamin D deficiency, unspecified: Secondary | ICD-10-CM | POA: Diagnosis not present

## 2020-01-24 DIAGNOSIS — E1140 Type 2 diabetes mellitus with diabetic neuropathy, unspecified: Secondary | ICD-10-CM | POA: Diagnosis not present

## 2020-01-24 DIAGNOSIS — R42 Dizziness and giddiness: Secondary | ICD-10-CM

## 2020-01-24 DIAGNOSIS — I10 Essential (primary) hypertension: Secondary | ICD-10-CM | POA: Diagnosis not present

## 2020-01-24 DIAGNOSIS — R002 Palpitations: Secondary | ICD-10-CM

## 2020-01-24 DIAGNOSIS — M8580 Other specified disorders of bone density and structure, unspecified site: Secondary | ICD-10-CM | POA: Diagnosis not present

## 2020-01-24 DIAGNOSIS — E210 Primary hyperparathyroidism: Secondary | ICD-10-CM

## 2020-01-24 MED ORDER — ROSUVASTATIN CALCIUM 5 MG PO TABS: 5.0000 mg | ORAL_TABLET | ORAL | 1 refills | Status: DC

## 2020-01-24 NOTE — Telephone Encounter (Signed)
I called patients eye doctor @ [facility] and requested last eye exam. Spoke with Ivin Booty.   Awaiting fax

## 2020-01-24 NOTE — Patient Instructions (Signed)
To start crestor 5 mg by mouth three times weekly  Fasting labs in 6 weeks  Follow up appt in 6 months

## 2020-01-24 NOTE — Progress Notes (Signed)
Careteam: Patient Care Team: Lauree Chandler, NP as PCP - General (Geriatric Medicine) Minus Breeding, MD as PCP - Cardiology (Cardiology) Alanda Slim Neena Rhymes, MD as Consulting Physician (Ophthalmology)  PLACE OF SERVICE:  [facility]  Advanced Directive information    Allergies  Allergen Reactions   Penicillins     Rash    Ivp Dye [Iodinated Diagnostic Agents]     Hives    Percocet [Oxycodone-Acetaminophen]     Itching     Chief Complaint  Patient presents with   Medical Management of Chronic Issues    3 month follow-up    Immunizations    Will wait to late October to receive flu vaccine. Discuss need for TD vaccine    Quality Metric Gaps    Eye exam completed in 09/02/2019, I will call for results      HPI: Patient is a 69 y.o. female for routine follow up  Pt with ongoing dizziness she was referred to ENT without evidence of BPPV or vestibular abnormality. Ongoing dizziness. Contributes it to her eyes.   Hyperlipidemia- not on statin due to a lot of muscle cramps, and had elevated triglycerides therefore was changed to fenofibrate   Hypertension- blood pressure was elevated at last visit but norvasc 5 mg daily added has been better since. Generally no LE edema- did have 1 episode Continues on losartan 100 mg daily  Dm- glipizide 5 mg BID, no low blood sugar   Got Dtap at [facility].  Review of Systems:  Review of Systems  Constitutional: Negative for chills, fever and weight loss.  HENT: Negative for tinnitus.   Respiratory: Negative for cough, sputum production and shortness of breath.   Cardiovascular: Negative for chest pain, palpitations and leg swelling.  Gastrointestinal: Negative for abdominal pain, constipation, diarrhea and heartburn.  Genitourinary: Negative for dysuria, frequency and urgency.  Musculoskeletal: Negative for back pain, falls, joint pain and myalgias.  Skin: Negative.   Neurological: Positive for dizziness. Negative for  weakness and headaches.  Psychiatric/Behavioral: Negative for depression and memory loss. The patient does not have insomnia.     Past Medical History:  Diagnosis Date   Arthritis    Cancer ([place])    melanoma- right leg   Depression    GERD (gastroesophageal reflux disease)    History of hiatal hernia    Hypercholesterolemia    Hypertension    Sweat, sweating, excessive    Type 2 diabetes mellitus ([place])    Past Surgical History:  Procedure Laterality Date   ABDOMINAL HYSTERECTOMY  1979   APPENDECTOMY  1964   BREAST EXCISIONAL BIOPSY Left    benign   BREAST LUMPECTOMY WITH RADIOACTIVE SEED LOCALIZATION Left 12/31/2014   Procedure: LEFT BREAST LUMPECTOMY WITH RADIOACTIVE SEED LOCALIZATION;  Surgeon: Donnie Mesa, MD;  Location: [facility];  Service: General;  Laterality: Left;   CARPAL TUNNEL RELEASE Right [place] Left 2007   CATARACT EXTRACTION Left 04/13/2014   Dr. Herbert Deaner   CATARACT EXTRACTION Right 06/10/2014   Dr. Herbert Deaner   CHOLECYSTECTOMY N/A 07/14/2017   Procedure: LAPAROSCOPIC CHOLECYSTECTOMY;  Surgeon: Ileana Roup, MD;  Location: [facility];  Service: General;  Laterality: N/A;   COLONOSCOPY  2009   [place] ARTHROPLASTY Left 2012   TOTAL KNEE ARTHROPLASTY Right 2008   Social History:   reports that she quit smoking about 33 years  ago. Her smoking use included cigarettes. She has a 15.00 pack-year smoking history. She has never used smokeless tobacco. She reports current alcohol use. She reports that she does not use drugs.  Family History  Problem Relation Age of Onset   Heart attack Father        Died of MI age 82   Pulmonary embolism Mother        Multiple, B/L    Hypertension Brother    Heart disease Brother 84   Diabetes Brother    High Cholesterol Brother    Hypertension Brother    High Cholesterol Brother     Hypertension Sister    Diabetes type II Sister    High Cholesterol Sister    Alcohol abuse Brother    Reye's syndrome Daughter    Hepatitis Brother    Stroke Other    Cancer Maternal Grandmother 66   Colon cancer Neg Hx    Esophageal cancer Neg Hx    Stomach cancer Neg Hx    Rectal cancer Neg Hx     Medications: Patient's Medications  New Prescriptions   No medications on file  Previous Medications   AMLODIPINE (NORVASC) 5 MG TABLET    Take 1 tablet (5 mg total) by mouth daily.   CHOLECALCIFEROL (VITAMIN D3) 25 MCG (1000 UT) CAPS    Take 1 capsule by mouth daily.   CHOLECALCIFEROL (VITAMIN D3) 50 MCG (2000 UT) TABS    Take 1 tablet by mouth daily.   CLINDAMYCIN (CLEOCIN) 300 MG CAPSULE    4 tablets prior to dental work   FENOFIBRATE (TRICOR) 145 MG TABLET    Take 1 tablet (145 mg total) by mouth daily.   GLIPIZIDE (GLUCOTROL) 5 MG TABLET    TAKE 1 TABLET BY MOUTH TWICE DAILY BEFORE MEAL(S)   LOSARTAN (COZAAR) 100 MG TABLET    Take 1 tablet (100 mg total) by mouth daily.   OMEPRAZOLE (PRILOSEC) 20 MG CAPSULE    Take 1 capsule (20 mg total) by mouth daily.   PAROXETINE (PAXIL) 30 MG TABLET    Take 1 tablet by mouth once daily  Modified Medications   No medications on file  Discontinued Medications   No medications on file    Physical Exam:  Vitals:   01/24/20 0936  BP: 138/74  Pulse: 86  Temp: (!) 96.8 F (36 C)  TempSrc: Temporal  SpO2: 96%  Weight: 226 lb 12.8 oz (102.9 kg)  Height: 5\' 4"  (1.626 m)   Body mass index is 38.93 kg/m. Wt Readings from Last 3 Encounters:  01/24/20 226 lb 12.8 oz (102.9 kg)  10/23/19 222 lb (100.7 kg)  08/29/19 224 lb 9.6 oz (101.9 kg)    Physical Exam Constitutional:      General: She is not in acute distress.    Appearance: She is well-developed. She is not diaphoretic.  HENT:     Head: Normocephalic and atraumatic.     Mouth/Throat:     Pharynx: No oropharyngeal exudate.  Eyes:     Conjunctiva/sclera:  Conjunctivae normal.     Pupils: Pupils are equal, round, and reactive to light.  Cardiovascular:     Rate and Rhythm: Normal rate and regular rhythm.     Heart sounds: Normal heart sounds.  Pulmonary:     Effort: Pulmonary effort is normal.     Breath sounds: Normal breath sounds.  Abdominal:     General: Bowel sounds are normal.     Palpations: Abdomen is soft.  Musculoskeletal:        General: No tenderness.     Cervical back: Normal range of motion and neck supple.  Skin:    General: Skin is warm and dry.  Neurological:     Mental Status: She is alert and oriented to person, place, and time.  Psychiatric:        Mood and Affect: Mood normal.        Behavior: Behavior normal.    Labs reviewed: Basic Metabolic Panel: Recent Labs    07/16/19 1030 08/29/19 1116 10/21/19 0901 10/23/19 1108  NA 136 136 137  --   K 4.2 4.0 4.3  --   CL 103 101 105  --   CO2 26 27 23   --   GLUCOSE 131* 118* 120*  --   BUN 21 23 18   --   CREATININE 1.18 1.27* 1.32*  --   CALCIUM 10.6* 10.8* 10.8*  --   MG  --   --   --  2.1   Liver Function Tests: Recent Labs    04/17/19 0856 07/16/19 1030 08/29/19 1116 10/21/19 0901  AST 20  --   --  17  ALT 21  --   --  15  BILITOT 0.4  --   --  0.7  PROT 7.2  --   --  7.1  ALBUMIN  --  4.3 4.4  --    No results for input(s): LIPASE, AMYLASE in the last 8760 hours. No results for input(s): AMMONIA in the last 8760 hours. CBC: Recent Labs    04/17/19 0856 10/21/19 0901  WBC 5.7 6.2  NEUTROABS 3,067 3,435  HGB 14.2 13.7  HCT 43.1 41.5  MCV 88.0 86.8  PLT 274 275   Lipid Panel: Recent Labs    04/17/19 0856 10/21/19 0901  CHOL 149 141  HDL 41* 46*  LDLCALC 84 75  TRIG 137 115  CHOLHDL 3.6 3.1   TSH: No results for input(s): TSH in the last 8760 hours. A1C: Lab Results  Component Value Date   HGBA1C 6.4 (H) 10/21/2019     Assessment/Plan 1. Dizziness Ongoing, but able to make lifestyle modifications.  2. Type 2  diabetes mellitus with diabetic neuropathy, without long-term current use of insulin ([place]) -continue on glipizide with dietary modifications. -Encouraged dietary compliance, routine foot care/monitoring and to keep up with diabetic eye exams through ophthalmology  - COMPLETE METABOLIC PANEL WITH GFR; Future - Lipid Panel; Future - Hemoglobin A1c; Future  3. Hyperlipidemia LDL goal <70 -ideally LDL should be less than 70 and due to diabetes best practice is for her to be on statin. She has had some myalgias in the past from statin but willing to try crestor three times a week. Will follow up labs in 6 weeks.   - COMPLETE METABOLIC PANEL WITH GFR; Future - Lipid Panel; Future - rosuvastatin (CRESTOR) 5 MG tablet; Take 1 tablet (5 mg total) by mouth 3 (three) times a week.  Dispense: 30 tablet; Refill: 1  4. Essential hypertension, benign Controlled on current regimen, improved bp with norvasc, without side effects  5. Osteopenia, unspecified location Continues on vit d, weight bearing activity encouraged  6. Vitamin D insufficiency Continues on vit D supplement.  7. Primary hyperparathyroidism ([place]) Stable followed by endocrine  8. Palpitations Stable at this time.   Next appt: with follow up fasting labs in 6 weeks and to get flu shot at that time Follow up in office in 6 months Janett Billow  Beaulah Corin, [facility] [phone]

## 2020-02-04 DIAGNOSIS — Z23 Encounter for immunization: Secondary | ICD-10-CM | POA: Diagnosis not present

## 2020-03-05 ENCOUNTER — Ambulatory Visit: Payer: Medicare Other | Admitting: Internal Medicine

## 2020-03-06 ENCOUNTER — Other Ambulatory Visit: Payer: Self-pay

## 2020-03-06 ENCOUNTER — Other Ambulatory Visit: Payer: Medicare Other

## 2020-03-06 ENCOUNTER — Ambulatory Visit (INDEPENDENT_AMBULATORY_CARE_PROVIDER_SITE_OTHER): Payer: Medicare Other

## 2020-03-06 DIAGNOSIS — E1140 Type 2 diabetes mellitus with diabetic neuropathy, unspecified: Secondary | ICD-10-CM

## 2020-03-06 DIAGNOSIS — Z23 Encounter for immunization: Secondary | ICD-10-CM | POA: Diagnosis not present

## 2020-03-06 DIAGNOSIS — E785 Hyperlipidemia, unspecified: Secondary | ICD-10-CM | POA: Diagnosis not present

## 2020-03-07 LAB — COMPLETE METABOLIC PANEL WITH GFR
AG Ratio: 1.6 (calc) (ref 1.0–2.5)
ALT: 20 U/L (ref 6–29)
AST: 20 U/L (ref 10–35)
Albumin: 4.2 g/dL (ref 3.6–5.1)
Alkaline phosphatase (APISO): 57 U/L (ref 37–153)
BUN/Creatinine Ratio: 18 (calc) (ref 6–22)
BUN: 22 mg/dL (ref 7–25)
CO2: 24 mmol/L (ref 20–32)
Calcium: 10.5 mg/dL — ABNORMAL HIGH (ref 8.6–10.4)
Chloride: 105 mmol/L (ref 98–110)
Creat: 1.24 mg/dL — ABNORMAL HIGH (ref 0.60–0.93)
GFR, Est African American: 51 mL/min/{1.73_m2} — ABNORMAL LOW (ref >=60)
GFR, Est Non African American: 44 mL/min/{1.73_m2} — ABNORMAL LOW (ref >=60)
Globulin: 2.6 g/dL (calc) (ref 1.9–3.7)
Glucose, Bld: 133 mg/dL — ABNORMAL HIGH (ref 65–99)
Potassium: 4.2 mmol/L (ref 3.5–5.3)
Sodium: 138 mmol/L (ref 135–146)
Total Bilirubin: 0.5 mg/dL (ref 0.2–1.2)
Total Protein: 6.8 g/dL (ref 6.1–8.1)

## 2020-03-07 LAB — LIPID PANEL
Cholesterol: 108 mg/dL (ref <200)
HDL: 42 mg/dL — ABNORMAL LOW (ref >=50)
LDL Cholesterol (Calc): 46 mg/dL (calc)
Non-HDL Cholesterol (Calc): 66 mg/dL (calc) (ref <130)
Total CHOL/HDL Ratio: 2.6 (calc) (ref <5.0)
Triglycerides: 117 mg/dL (ref <150)

## 2020-03-07 LAB — HEMOGLOBIN A1C
Hgb A1c MFr Bld: 6.6 % of total Hgb — ABNORMAL HIGH (ref <5.7)
Mean Plasma Glucose: 143 (calc)
eAG (mmol/L): 7.9 (calc)

## 2020-03-26 ENCOUNTER — Other Ambulatory Visit: Payer: Self-pay

## 2020-03-26 ENCOUNTER — Ambulatory Visit (INDEPENDENT_AMBULATORY_CARE_PROVIDER_SITE_OTHER): Payer: Medicare Other | Admitting: Internal Medicine

## 2020-03-26 ENCOUNTER — Encounter: Payer: Self-pay | Admitting: Internal Medicine

## 2020-03-26 VITALS — BP 126/82 | HR 90 | Ht 64.0 in | Wt 229.0 lb

## 2020-03-26 DIAGNOSIS — E559 Vitamin D deficiency, unspecified: Secondary | ICD-10-CM | POA: Diagnosis not present

## 2020-03-26 DIAGNOSIS — E210 Primary hyperparathyroidism: Secondary | ICD-10-CM | POA: Diagnosis not present

## 2020-03-26 LAB — BASIC METABOLIC PANEL
BUN: 21 mg/dL (ref 6–23)
CO2: 28 mEq/L (ref 19–32)
Calcium: 10.4 mg/dL (ref 8.4–10.5)
Chloride: 105 mEq/L (ref 96–112)
Creatinine, Ser: 1.38 mg/dL — ABNORMAL HIGH (ref 0.40–1.20)
GFR: 38.59 mL/min — ABNORMAL LOW (ref >60.00)
Glucose, Bld: 110 mg/dL — ABNORMAL HIGH (ref 70–99)
Potassium: 4.5 mEq/L (ref 3.5–5.1)
Sodium: 139 mEq/L (ref 135–145)

## 2020-03-26 LAB — VITAMIN D 25 HYDROXY (VIT D DEFICIENCY, FRACTURES): VITD: 36.83 ng/mL (ref 30.00–100.00)

## 2020-03-26 LAB — ALBUMIN: Albumin: 4.2 g/dL (ref 3.5–5.2)

## 2020-03-26 NOTE — Patient Instructions (Addendum)
-   Please stay hydrated  - AVOID CALCIUM SUPPLEMENTS, AVOID LOW CALCIUM DIET - Maintain normal dietary calcium intake (2-3 servings of dairy a day) - Continue Vitamin D 3000 iu daily    24-Hour Urine Collection   You will be collecting your urine for a 24-hour period of time.  Your timer starts with your first urine of the morning (For example - If you first pee at [place], your timer will start at [place])  [place] away your first urine of the morning  Collect your urine every time you pee for the next 24 hours STOP your urine collection 24 hours after you started the collection (For example - You would stop at 9AM the day after you started)

## 2020-03-26 NOTE — Progress Notes (Signed)
Name: Jill Garza [facility]  MRN/ DOB: [id number], 1950-06-18    Age/ Sex: 69 y.o., female     PCP: Jill Chandler, NP   Reason for Endocrinology Evaluation: Hypercalcemia     Initial Endocrinology Clinic Visit: 04/30/2019    PATIENT IDENTIFIER: Jill Garza is a 69 y.o., female with a past medical history of T2DM, HTN and Hypercalcemia. She has followed with [facility] since 04/30/2019 for consultative assistance with management of her  Hypercalcemia .   HISTORICAL SUMMARY: The patient was first diagnosed with hypercalcemia in 2016.   Her DXA (07/19/2019) showed a low bone density with a Left  femoral neck T-Score of -1.8 CT scan (2019)- no nephrolithiasis 24- hr urine Ca/Cr ratio 0.049 with a urinary excretion of 464 mg in 07/2019  She is a retired Health visitor SUBJECTIVE:   Today (03/26/2020):  Jill Garza is here for a follow up on hypercalcemia.   Has chronic polyuria and polydipsia No recent dx of nephrolithiasis  Has recent constipation   Vitamin D 3000 iu daily      HISTORY:  Past Medical History:  Past Medical History:  Diagnosis Date  . Arthritis   . Cancer ([place])    melanoma- right leg  . Depression   . GERD (gastroesophageal reflux disease)   . History of hiatal hernia   . Hypercholesterolemia   . Hypertension   . Sweat, sweating, excessive   . Type 2 diabetes mellitus ([place])    Past Surgical History:  Past Surgical History:  Procedure Laterality Date  . ABDOMINAL HYSTERECTOMY  1979  . APPENDECTOMY  1964  . BREAST EXCISIONAL BIOPSY Left    benign  . BREAST LUMPECTOMY WITH RADIOACTIVE SEED LOCALIZATION Left 12/31/2014   Procedure: LEFT BREAST LUMPECTOMY WITH RADIOACTIVE SEED LOCALIZATION;  Surgeon: Jill Mesa, MD;  Location: [place];  Service: General;  Laterality: Left;  . CARPAL TUNNEL RELEASE Right 1977  . CARPAL TUNNEL RELEASE Left 2007  . CATARACT EXTRACTION Left 04/13/2014   Dr. Herbert Garza  . CATARACT  EXTRACTION Right 06/10/2014   Dr. Herbert Garza  . CHOLECYSTECTOMY N/A 07/14/2017   Procedure: LAPAROSCOPIC CHOLECYSTECTOMY;  Surgeon: Jill Roup, MD;  Location: [place];  Service: General;  Laterality: N/A;  . COLONOSCOPY  2009  . JOINT REPLACEMENT    . MELANOMA EXCISION    . TONSILLECTOMY  1962  . TOTAL KNEE ARTHROPLASTY Left 2012  . TOTAL KNEE ARTHROPLASTY Right 2008    Social History:  reports that she quit smoking about 33 years ago. Her smoking use included cigarettes. She has a 15.00 pack-year smoking history. She has never used smokeless tobacco. She reports current alcohol use. She reports that she does not use drugs. Family History:  Family History  Problem Relation Age of Onset  . Heart attack Father        Died of MI age 36  . Pulmonary embolism Mother        Multiple, B/L   . Hypertension Brother   . Heart disease Brother 38  . Diabetes Brother   . High Cholesterol Brother   . Hypertension Brother   . High Cholesterol Brother   . Hypertension Sister   . Diabetes type II Sister   . High Cholesterol Sister   . Alcohol abuse Brother   . Reye's syndrome Daughter   . Hepatitis Brother   . Stroke Other   . Cancer Maternal Grandmother 43  . Colon cancer Neg Hx   .  Esophageal cancer Neg Hx   . Stomach cancer Neg Hx   . Rectal cancer Neg Hx      HOME MEDICATIONS: Allergies as of 03/26/2020      Reactions   Penicillins    Rash    Ivp Dye [iodinated Diagnostic Agents]    Hives    Percocet [oxycodone-acetaminophen]    Itching       Medication List       Accurate as of March 26, 2020  7:32 AM. If you have any questions, ask your nurse or doctor.        amLODipine 5 MG tablet Commonly known as: NORVASC Take 1 tablet (5 mg total) by mouth daily.   clindamycin 300 MG capsule Commonly known as: CLEOCIN 4 tablets prior to dental work   fenofibrate 145 MG tablet Commonly known as: TRICOR Take 1 tablet (145 mg total) by mouth daily.   glipiZIDE 5 MG  tablet Commonly known as: GLUCOTROL TAKE 1 TABLET BY MOUTH TWICE DAILY BEFORE MEAL(S)   losartan 100 MG tablet Commonly known as: COZAAR Take 1 tablet (100 mg total) by mouth daily.   omeprazole 20 MG capsule Commonly known as: PRILOSEC Take 1 capsule (20 mg total) by mouth daily.   PARoxetine 30 MG tablet Commonly known as: PAXIL Take 1 tablet by mouth once daily   rosuvastatin 5 MG tablet Commonly known as: Crestor Take 1 tablet (5 mg total) by mouth 3 (three) times a week.   Vitamin D3 50 MCG (2000 UT) Tabs Take 1 tablet by mouth daily.   Vitamin D3 25 MCG (1000 UT) Caps Take 1 capsule by mouth daily.         OBJECTIVE:   PHYSICAL EXAM: VS:BP 126/82   Pulse 90   Ht 5\' 4"  (1.626 m)   Wt 229 lb (103.9 kg)   SpO2 98%   BMI 39.31 kg/m   EXAM: General: Pt appears well and is in NAD  Neck: General: Supple without adenopathy. Thyroid: Thyroid size normal.  No goiter or nodules appreciated.   Lungs: Clear with good BS bilat with no rales, rhonchi, or wheezes  Heart: Auscultation: RRR.  Abdomen: Normoactive bowel sounds, soft, nontender, without masses or organomegaly palpable  Extremities:  BL LE: No pretibial edema normal ROM and strength.  Mental Status: Judgment, insight: Intact Orientation: Oriented to time, place, and person Mood and affect: No depression, anxiety, or agitation     DATA REVIEWED:  Results for Jill Garza (MRN [id number]) as of 03/27/2020 12:09  Ref. Range 03/26/2020 08:33  Sodium Latest Ref Range: 135 - 145 mEq/L 139  Potassium Latest Ref Range: 3.5 - 5.1 mEq/L 4.5  Chloride Latest Ref Range: 96 - 112 mEq/L 105  CO2 Latest Ref Range: 19 - 32 mEq/L 28  Glucose Latest Ref Range: 70 - 99 mg/dL 110 (H)  BUN Latest Ref Range: 6 - 23 mg/dL 21  Creatinine Latest Ref Range: 0.40 - 1.20 mg/dL 1.38 (H)  Calcium Latest Ref Range: 8.4 - 10.5 mg/dL 10.4  Albumin Latest Ref Range: 3.5 - 5.2 g/dL 4.2  GFR Latest Ref Range: >60.00 mL/min  38.59 (L)  VITD Latest Ref Range: 30.00 - 100.00 ng/mL 36.83  PTH, Intact Latest Ref Range: 14 - 64 pg/mL 55      ASSESSMENT / PLAN / RECOMMENDATIONS:   1. Primary Hyperparathyroidism:  - Serum and Corrected calcium level is normal at 10.24 mg/dL on today's labs but historucally serum calcium range 10.5-11.3 mg/dL and PTH  45-85 pg/mL  - We discussed surgical intervention based on low GFR  - Will repeat 24-hr urine Ca/Cr ratio    Recommendations  - Encouraged hydration  - AVOID CALCIUM SUPPLEMENTS, AVOID LOW CALCIUM DIET - Maintain normal dietary calcium intake (2-3 servings of dairy a day)  2. Vitamin D Insufficiency:    - Normalized  - Continue Vitamin D3  3000 iu daily     F/U in 6 months     Signed electronically by: Mack Guise, MD  [facility]  [facility] [address] [place], [place] [address] Phone: [phone] FAX: [phone]      CC: Jill Chandler, NP [place] [address] Phone: [phone]  Fax: [phone]   Return to Endocrinology clinic as below: Future Appointments  Date Time Provider [place]  03/26/2020  8:10 AM Irene Collings, Melanie Crazier, MD [facility] None  04/22/2020 10:00 AM Jill Chandler, NP [facility] None  07/24/2020  9:30 AM Jill Chandler, NP [facility] None

## 2020-03-27 LAB — PARATHYROID HORMONE, INTACT (NO CA): PTH: 55 pg/mL (ref 14–64)

## 2020-03-31 ENCOUNTER — Encounter: Payer: Self-pay | Admitting: Internal Medicine

## 2020-03-31 ENCOUNTER — Other Ambulatory Visit: Payer: Self-pay

## 2020-03-31 ENCOUNTER — Other Ambulatory Visit: Payer: Medicare Other

## 2020-03-31 DIAGNOSIS — E210 Primary hyperparathyroidism: Secondary | ICD-10-CM

## 2020-04-01 ENCOUNTER — Encounter: Payer: Self-pay | Admitting: Internal Medicine

## 2020-04-01 DIAGNOSIS — E210 Primary hyperparathyroidism: Secondary | ICD-10-CM

## 2020-04-01 LAB — EXTRA URINE SPECIMEN

## 2020-04-01 LAB — CREATININE, URINE, 24 HOUR: Creatinine, 24H Ur: 1.52 g/(24.h) (ref 0.50–2.15)

## 2020-04-01 LAB — CALCIUM, URINE, 24 HOUR: Calcium, 24H Urine: 397 mg/24 h — ABNORMAL HIGH

## 2020-04-03 ENCOUNTER — Other Ambulatory Visit: Payer: Self-pay | Admitting: Nurse Practitioner

## 2020-04-03 DIAGNOSIS — E119 Type 2 diabetes mellitus without complications: Secondary | ICD-10-CM

## 2020-04-22 ENCOUNTER — Encounter: Payer: Self-pay | Admitting: Nurse Practitioner

## 2020-04-22 ENCOUNTER — Ambulatory Visit (INDEPENDENT_AMBULATORY_CARE_PROVIDER_SITE_OTHER): Payer: Medicare Other | Admitting: Nurse Practitioner

## 2020-04-22 ENCOUNTER — Telehealth: Payer: Self-pay

## 2020-04-22 ENCOUNTER — Other Ambulatory Visit: Payer: Self-pay

## 2020-04-22 DIAGNOSIS — Z0000 Encounter for general adult medical examination without abnormal findings: Secondary | ICD-10-CM

## 2020-04-22 DIAGNOSIS — Z1211 Encounter for screening for malignant neoplasm of colon: Secondary | ICD-10-CM

## 2020-04-22 DIAGNOSIS — Z1212 Encounter for screening for malignant neoplasm of rectum: Secondary | ICD-10-CM

## 2020-04-22 NOTE — Progress Notes (Signed)
Subjective:   Joanna Borawski is a 69 y.o. female who presents for Medicare Annual (Subsequent) preventive examination.  Review of Systems     Cardiac Risk Factors include: sedentary lifestyle;advanced age (>69men, >60 women);family history of premature cardiovascular disease;obesity (BMI >30kg/m2);hypertension;diabetes mellitus;dyslipidemia     Objective:    There were no vitals filed for this visit. There is no height or weight on file to calculate BMI.  Advanced Directives 04/22/2020 10/23/2019 04/22/2019 10/23/2018 04/18/2018 04/18/2018 07/14/2017  Does Patient Have a Medical Advance Directive? Yes Yes Yes No No No -  Type of Advance Directive Out of facility DNR (pink MOST or yellow form);[place];Living will [place];Living will [place];Living will - - - -  Does patient want to make changes to medical advance directive? No - Patient declined No - Patient declined No - Patient declined - - - -  Copy of [place] in Chart? No - copy requested - No - copy requested - - - -  Would patient like information on creating a medical advance directive? - - - No - Patient declined - Yes (MAU/Ambulatory/Procedural Areas - Information given) No - Patient declined    Current Medications (verified) Outpatient Encounter Medications as of 04/22/2020  Medication Sig   amLODipine (NORVASC) 5 MG tablet Take 1 tablet (5 mg total) by mouth daily.   Cholecalciferol (VITAMIN D3) 25 MCG (1000 UT) CAPS Take 1 capsule by mouth daily.   Cholecalciferol (VITAMIN D3) 50 MCG (2000 UT) TABS Take 1 tablet by mouth daily.   clindamycin (CLEOCIN) 300 MG capsule 4 tablets prior to dental work   fenofibrate (TRICOR) 145 MG tablet Take 1 tablet (145 mg total) by mouth daily.   glipiZIDE (GLUCOTROL) 5 MG tablet TAKE 1 TABLET BY MOUTH TWICE DAILY BEFORE MEAL(S) (Patient taking differently: 10 mg daily. TAKE 1 TABLET BY MOUTH TWICE  DAILY BEFORE MEAL(S))   losartan (COZAAR) 100 MG tablet Take 1 tablet (100 mg total) by mouth daily.   omeprazole (PRILOSEC) 20 MG capsule Take 1 capsule (20 mg total) by mouth daily.   PARoxetine (PAXIL) 30 MG tablet Take 1 tablet by mouth once daily   rosuvastatin (CRESTOR) 5 MG tablet Take 1 tablet (5 mg total) by mouth 3 (three) times a week.   No facility-administered encounter medications on file as of 04/22/2020.    Allergies (verified) Penicillins, Ivp dye [iodinated diagnostic agents], and Percocet [oxycodone-acetaminophen]   History: Past Medical History:  Diagnosis Date   Arthritis    Cancer ([place])    melanoma- right leg   Depression    GERD (gastroesophageal reflux disease)    History of hiatal hernia    Hypercholesterolemia    Hyperparathyroidism ([facility])    Hypertension    Sweat, sweating, excessive    Type 2 diabetes mellitus ([place])    Past Surgical History:  Procedure Laterality Date   ABDOMINAL HYSTERECTOMY  1979   APPENDECTOMY  1964   BREAST EXCISIONAL BIOPSY Left    benign   BREAST LUMPECTOMY WITH RADIOACTIVE SEED LOCALIZATION Left 12/31/2014   Procedure: LEFT BREAST LUMPECTOMY WITH RADIOACTIVE SEED LOCALIZATION;  Surgeon: Donnie Mesa, MD;  Location: [place];  Service: General;  Laterality: Left;   CARPAL TUNNEL RELEASE Right [place] Left 2007   CATARACT EXTRACTION Left 04/13/2014   Dr. Herbert Deaner   CATARACT EXTRACTION Right 06/10/2014   Dr. Herbert Deaner   CHOLECYSTECTOMY N/A 07/14/2017  Procedure: LAPAROSCOPIC CHOLECYSTECTOMY;  Surgeon: Ileana Roup, MD;  Location: [place] [facility];  Service: General;  Laterality: N/A;   COLONOSCOPY  2009   JOINT REPLACEMENT     MELANOMA EXCISION     TONSILLECTOMY  1962   TOTAL KNEE ARTHROPLASTY Left 2012   TOTAL KNEE ARTHROPLASTY Right 2008   Family History  Problem Relation Age of Onset   Heart attack Father        Died of MI age 45   Pulmonary embolism Mother         Multiple, B/L    Hypertension Brother    Heart disease Brother 71   Diabetes Brother    High Cholesterol Brother    Hypertension Brother    High Cholesterol Brother    Hypertension Sister    Diabetes type II Sister    High Cholesterol Sister    Alcohol abuse Brother    Reye's syndrome Daughter    Hepatitis Brother    Stroke Other    Cancer Maternal Grandmother 39   Colon cancer Neg Hx    Esophageal cancer Neg Hx    Stomach cancer Neg Hx    Rectal cancer Neg Hx    Social History   Socioeconomic History   Marital status: Married    Spouse name: Not on file   Number of children: Not on file   Years of education: Not on file   Highest education level: Not on file  Occupational History   Not on file  Tobacco Use   Smoking status: Former Smoker    Packs/day: 1.00    Years: 15.00    Pack years: 15.00    Types: Cigarettes    Quit date: 05/09/1986    Years since quitting: 33.9   Smokeless tobacco: Never Used  Vaping Use   Vaping Use: Never used  Substance and Sexual Activity   Alcohol use: Yes    Alcohol/week: 0.0 standard drinks    Comment: Vacation only, couple times yearly    Drug use: No   Sexual activity: Yes  Other Topics Concern   Not on file  Social History Narrative   Not on file   Social Determinants of Health   Financial Resource Strain: Not on file  Food Insecurity: Not on file  Transportation Needs: Not on file  Physical Activity: Not on file  Stress: Not on file  Social Connections: Not on file    Tobacco Counseling Counseling given: Not Answered   Clinical Intake:  Pre-visit preparation completed: Yes  Pain : No/denies pain     BMI - recorded: 39 Nutritional Status: BMI > 30  Obese Diabetes: Yes  How often do you need to have someone help you when you read instructions, pamphlets, or other written materials from your doctor or pharmacy?: 1 - Never  Diabetic?yes         Activities of Daily  Living In your present state of health, do you have any difficulty performing the following activities: 04/22/2020  Hearing? Y  Vision? N  Difficulty concentrating or making decisions? Y  Comment slower response time  Walking or climbing stairs? N  Dressing or bathing? N  Doing errands, shopping? N  Preparing Food and eating ? N  Using the Toilet? N  In the past six months, have you accidently leaked urine? N  Do you have problems with loss of bowel control? N  Managing your Medications? N  Managing your Finances? N  Housekeeping or managing your Housekeeping? N  Some recent data might be hidden    Patient Care Team: Lauree Chandler, NP as PCP - General (Geriatric Medicine) Minus Breeding, MD as PCP - Cardiology (Cardiology) Alanda Slim Neena Rhymes, MD as Consulting Physician (Ophthalmology)  Indicate any recent Medical Services you may have received from other than [facility] providers in the past year (date may be approximate).     Assessment:   This is a routine wellness examination for [place].  Hearing/Vision screen  Hearing Screening   125Hz  250Hz  500Hz  1000Hz  2000Hz  3000Hz  4000Hz  6000Hz  8000Hz   Right ear:           Left ear:           Comments: Patient has problems with hearing. Patient recently had hearing exam in the summer.  Vision Screening Comments: Patient wears glasses. Patient sees Dr. Brooke Dare. Patient had eye exam in May.  Dietary issues and exercise activities discussed: Current Exercise Habits: The patient does not participate in regular exercise at present  Goals      Weight (lb) < 200 lb (90.7 kg)      Starting 03/18/16, I will attempt to get under 200 lbs.       weight loss (pt-stated)      I will attempt to get under 210 lbs      Depression Screen PHQ 2/9 Scores 04/22/2020 01/24/2020 10/23/2019 04/22/2019 10/23/2018 04/18/2018 04/18/2018  PHQ - 2 Score 0 0 0 0 0 0 0    Fall Risk Fall Risk  04/22/2020 01/24/2020 10/23/2019 04/22/2019 10/23/2018   Falls in the past year? 0 0 0 0 0  Comment - - - - -  Number falls in past yr: 0 0 0 0 0  Injury with Fall? 0 0 0 0 0    FALL RISK PREVENTION PERTAINING TO THE HOME:  Any stairs in or around the home? Yes  If so, are there any without handrails? No  Home free of loose throw rugs in walkways, pet beds, electrical cords, etc? Yes  Adequate lighting in your home to reduce risk of falls? Yes   ASSISTIVE DEVICES UTILIZED TO PREVENT FALLS:  Life alert? No  Use of a cane, walker or w/c? No  Grab bars in the bathroom? No  Shower chair or bench in shower? No  Elevated toilet seat or a handicapped toilet? Yes   TIMED UP AND GO:  Was the test performed? No .    Cognitive Function: MMSE - Mini Mental State Exam 04/22/2019 04/13/2017 03/18/2016 01/29/2015  Not completed: Refused - - -  Orientation to time 5 5 5 5   Orientation to Place 5 5 5 5   Registration 3 3 3 3   Attention/ Calculation 5 5 5 5   Recall 3 3 3 1   Language- name 2 objects 2 2 2 2   Language- repeat 1 1 1 1   Language- follow 3 step command 3 3 3 3   Language- read & follow direction 1 1 1 1   Write a sentence 1 1 1 1   Copy design 1 1 1 1   Total score 30 30 30 28      6CIT Screen 04/22/2020  What Year? 0 points  What month? 0 points  What time? 0 points  Count back from 20 0 points  Months in reverse 0 points  Repeat phrase 0 points  Total Score 0    Immunizations Immunization History  Administered Date(s) Administered   Fluad Quad(high Dose 65+) 02/05/2019, 03/06/2020   Influenza Whole 01/26/2013   Influenza, High  Dose Seasonal PF 02/21/2018   Influenza-Unspecified 01/28/2014, 02/18/2016, 03/01/2017   PFIZER SARS-COV-2 Vaccination 05/31/2019, 06/21/2019, 02/04/2020   Pneumococcal Conjugate-13 12/17/2013   Pneumococcal Polysaccharide-23 04/09/2013, 04/18/2018   Tdap 08/17/2009, 10/24/2019   Zoster 01/09/2011   Zoster Recombinat (Shingrix) 04/18/2018, 06/25/2018    TDAP status: Up to  date  Flu Vaccine status: Up to date  Pneumococcal vaccine status: Up to date  Covid-19 vaccine status: Completed vaccines  Qualifies for Shingles Vaccine? Yes   Zostavax completed Yes   Shingrix Completed?: Yes  Screening Tests Health Maintenance  Topic Date Due   COLONOSCOPY  04/14/2020   FOOT EXAM  04/21/2020   OPHTHALMOLOGY EXAM  09/01/2020   HEMOGLOBIN A1C  09/04/2020   MAMMOGRAM  12/26/2021   TETANUS/TDAP  10/23/2029   INFLUENZA VACCINE  Completed   DEXA SCAN  Completed   COVID-19 Vaccine  Completed   Hepatitis C Screening  Completed   PNA vac Low Risk Adult  Completed    Health Maintenance  Health Maintenance Due  Topic Date Due   COLONOSCOPY  04/14/2020   FOOT EXAM  04/21/2020    Colorectal cancer screening: Referral to GI placed today. Pt aware the office will call re: appt.  Mammogram status: Completed 12/27/19. Repeat every year  Bone Density status: Completed march 12. Results reflect: Bone density results: OSTEOPENIA. Repeat every 2 years.  Lung Cancer Screening: (Low Dose CT Chest recommended if Age 69-80 years, 30 pack-year currently smoking OR have quit w/in 15years.) does not qualify.   Lung Cancer Screening Referral: na  Additional Screening:  Hepatitis C Screening: does qualify; Completed 2016  Vision Screening: Recommended annual ophthalmology exams for early detection of glaucoma and other disorders of the eye. Is the patient up to date with their annual eye exam?  Yes  Who is the provider or what is the name of the office in which the patient attends annual eye exams? [facility] If pt is not established with a provider, would they like to be referred to a provider to establish care? No .   Dental Screening: Recommended annual dental exams for proper oral hygiene  Community Resource Referral / Chronic Care Management: CRR required this visit?  No   CCM required this visit?  No      Plan:     I have personally reviewed and  noted the following in the patients chart:    Medical and social history  Use of alcohol, tobacco or illicit drugs   Current medications and supplements  Functional ability and status  Nutritional status  Physical activity  Advanced directives  List of other physicians  Hospitalizations, surgeries, and ER visits in previous 12 months  Vitals  Screenings to include cognitive, depression, and falls  Referrals and appointments  In addition, I have reviewed and discussed with patient certain preventive protocols, quality metrics, and best practice recommendations. A written personalized care plan for preventive services as well as general preventive health recommendations were provided to patient.     Lauree Chandler, NP   04/22/2020    Virtual Visit via Telephone Note  I connected with@ on 04/22/20 at 10:00 AM EST by telephone and verified that I am speaking with the correct person using two identifiers.  Location: Patient: home Provider: [facility]   I discussed the limitations, risks, security and privacy concerns of performing an evaluation and management service by telephone and the availability of in person appointments. I also discussed with the patient that there may be a patient  responsible charge related to this service. The patient expressed understanding and agreed to proceed.   I discussed the assessment and treatment plan with the patient. The patient was provided an opportunity to ask questions and all were answered. The patient agreed with the plan and demonstrated an understanding of the instructions.   The patient was advised to call back or seek an in-person evaluation if the symptoms worsen or if the condition fails to improve as anticipated.  I provided 18 minutes of non-face-to-face time during this encounter.  Carlos American. Harle Battiest Avs printed and mailed

## 2020-04-22 NOTE — Telephone Encounter (Signed)
[name], escandon are scheduled for a virtual visit with your provider today.    Just as we do with appointments in the office, we must obtain your consent to participate.  Your consent will be active for this visit and any virtual visit you may have with one of our providers in the next 365 days.    If you have a MyChart account, I can also send a copy of this consent to you electronically.  All virtual visits are billed to your insurance company just like a traditional visit in the office.  As this is a virtual visit, video technology does not allow for your provider to perform a traditional examination.  This may limit your provider's ability to fully assess your condition.  If your provider identifies any concerns that need to be evaluated in person or the need to arrange testing such as labs, EKG, etc, we will make arrangements to do so.    Although advances in technology are sophisticated, we cannot ensure that it will always work on either your end or our end.  If the connection with a video visit is poor, we may have to switch to a telephone visit.  With either a video or telephone visit, we are not always able to ensure that we have a secure connection.   I need to obtain your verbal consent now.   Are you willing to proceed with your visit today?   Jill Garza has provided verbal consent on 04/22/2020 for a virtual visit (video or telephone).   Carroll Kinds, CMA 04/22/2020  10:07 AM

## 2020-04-22 NOTE — Patient Instructions (Signed)
Jill Garza , Thank you for taking time to come for your Medicare Wellness Visit. I appreciate your ongoing commitment to your health goals. Please review the following plan we discussed and let me know if I can assist you in the future.   Screening recommendations/referrals: Colonoscopy- referral placed Mammogram up to date Bone Density up to date Recommended yearly ophthalmology/optometry visit for glaucoma screening and checkup Recommended yearly dental visit for hygiene and checkup  Vaccinations: Influenza vaccine up to date Pneumococcal vaccine up to date Tdap vaccine up to date Shingles vaccine up to date    Advanced directives: on file, to look over MOST form, we can complete in office  Conditions/risks identified: cardiovascular risk, obesity, hyperlipidemia, hyperlipidemia.  Next appointment: 1 year.   Preventive Care 71 Years and Older, Female Preventive care refers to lifestyle choices and visits with your health care provider that can promote health and wellness. What does preventive care include?  A yearly physical exam. This is also called an annual well check.  Dental exams once or twice a year.  Routine eye exams. Ask your health care provider how often you should have your eyes checked.  Personal lifestyle choices, including:  Daily care of your teeth and gums.  Regular physical activity.  Eating a healthy diet.  Avoiding tobacco and drug use.  Limiting alcohol use.  Practicing safe sex.  Taking low-dose aspirin every day.  Taking vitamin and mineral supplements as recommended by your health care provider. What happens during an annual well check? The services and screenings done by your health care provider during your annual well check will depend on your age, overall health, lifestyle risk factors, and family history of disease. Counseling  Your health care provider may ask you questions about your:  Alcohol use.  Tobacco use.  Drug  use.  Emotional well-being.  Home and relationship well-being.  Sexual activity.  Eating habits.  History of falls.  Memory and ability to understand (cognition).  Work and work Statistician.  Reproductive health. Screening  You may have the following tests or measurements:  Height, weight, and BMI.  Blood pressure.  Lipid and cholesterol levels. These may be checked every 5 years, or more frequently if you are over 71 years old.  Skin check.  Lung cancer screening. You may have this screening every year starting at age 71 if you have a 30-pack-year history of smoking and currently smoke or have quit within the past 15 years.  Fecal occult blood test (FOBT) of the stool. You may have this test every year starting at age 71.  Flexible sigmoidoscopy or colonoscopy. You may have a sigmoidoscopy every 5 years or a colonoscopy every 10 years starting at age 71.  Hepatitis C blood test.  Hepatitis B blood test.  Sexually transmitted disease (STD) testing.  Diabetes screening. This is done by checking your blood sugar (glucose) after you have not eaten for a while (fasting). You may have this done every 1-3 years.  Bone density scan. This is done to screen for osteoporosis. You may have this done starting at age 71.  Mammogram. This may be done every 1-2 years. Talk to your health care provider about how often you should have regular mammograms. Talk with your health care provider about your test results, treatment options, and if necessary, the need for more tests. Vaccines  Your health care provider may recommend certain vaccines, such as:  Influenza vaccine. This is recommended every year.  Tetanus, diphtheria, and acellular pertussis (  Tdap, Td) vaccine. You may need a Td booster every 10 years.  Zoster vaccine. You may need this after age 71.  Pneumococcal 13-valent conjugate (PCV13) vaccine. One dose is recommended after age 71.  Pneumococcal polysaccharide  (PPSV23) vaccine. One dose is recommended after age 71. Talk to your health care provider about which screenings and vaccines you need and how often you need them. This information is not intended to replace advice given to you by your health care provider. Make sure you discuss any questions you have with your health care provider. Document Released: 05/22/2015 Document Revised: 01/13/2016 Document Reviewed: 02/24/2015 Elsevier Interactive Patient Education  2017 [employer] [place] Prevention in the Home Falls can cause injuries. They can happen to people of all ages. There are many things you can do to make your home safe and to help prevent falls. What can I do on the outside of my home?  Regularly fix the edges of walkways and driveways and fix any cracks.  Remove anything that might make you trip as you walk through a door, such as a raised step or threshold.  Trim any bushes or trees on the path to your home.  Use bright outdoor lighting.  Clear any walking paths of anything that might make someone trip, such as rocks or tools.  Regularly check to see if handrails are loose or broken. Make sure that both sides of any steps have handrails.  Any raised decks and porches should have guardrails on the edges.  Have any leaves, snow, or ice cleared regularly.  Use sand or salt on walking paths during winter.  Clean up any spills in your garage right away. This includes oil or grease spills. What can I do in the bathroom?  Use night lights.  Install grab bars by the toilet and in the tub and shower. Do not use towel bars as grab bars.  Use non-skid mats or decals in the tub or shower.  If you need to sit down in the shower, use a plastic, non-slip stool.  Keep the floor dry. Clean up any water that spills on the floor as soon as it happens.  Remove soap buildup in the tub or shower regularly.  Attach bath mats securely with double-sided non-slip rug tape.  Do not have  throw rugs and other things on the floor that can make you trip. What can I do in the bedroom?  Use night lights.  Make sure that you have a light by your bed that is easy to reach.  Do not use any sheets or blankets that are too big for your bed. They should not hang down onto the floor.  Have a firm chair that has side arms. You can use this for support while you get dressed.  Do not have throw rugs and other things on the floor that can make you trip. What can I do in the kitchen?  Clean up any spills right away.  Avoid walking on wet floors.  Keep items that you use a lot in easy-to-reach places.  If you need to reach something above you, use a strong step stool that has a grab bar.  Keep electrical cords out of the way.  Do not use floor polish or wax that makes floors slippery. If you must use wax, use non-skid floor wax.  Do not have throw rugs and other things on the floor that can make you trip. What can I do with my stairs?  Do  not leave any items on the stairs.  Make sure that there are handrails on both sides of the stairs and use them. Fix handrails that are broken or loose. Make sure that handrails are as long as the stairways.  Check any carpeting to make sure that it is firmly attached to the stairs. Fix any carpet that is loose or worn.  Avoid having throw rugs at the top or bottom of the stairs. If you do have throw rugs, attach them to the floor with carpet tape.  Make sure that you have a light switch at the top of the stairs and the bottom of the stairs. If you do not have them, ask someone to add them for you. What else can I do to help prevent falls?  Wear shoes that:  Do not have high heels.  Have rubber bottoms.  Are comfortable and fit you well.  Are closed at the toe. Do not wear sandals.  If you use a stepladder:  Make sure that it is fully opened. Do not climb a closed stepladder.  Make sure that both sides of the stepladder are  locked into place.  Ask someone to hold it for you, if possible.  Clearly mark and make sure that you can see:  Any grab bars or handrails.  First and last steps.  Where the edge of each step is.  Use tools that help you move around (mobility aids) if they are needed. These include:  Canes.  Walkers.  Scooters.  Crutches.  Turn on the lights when you go into a dark area. Replace any light bulbs as soon as they burn out.  Set up your furniture so you have a clear path. Avoid moving your furniture around.  If any of your floors are uneven, fix them.  If there are any pets around you, be aware of where they are.  Review your medicines with your doctor. Some medicines can make you feel dizzy. This can increase your chance of falling. Ask your doctor what other things that you can do to help prevent falls. This information is not intended to replace advice given to you by your health care provider. Make sure you discuss any questions you have with your health care provider. Document Released: 02/19/2009 Document Revised: 10/01/2015 Document Reviewed: 05/30/2014 Elsevier Interactive Patient Education  2017 [employer].

## 2020-04-22 NOTE — Progress Notes (Signed)
This service is provided via telemedicine  No vital signs collected/recorded due to the encounter was a telemedicine visit.   Location of patient (ex: home, work):  Home  Patient consents to a telephone visit: Jill Garza, see encounter dated 04/22/2020  Location of the provider (ex: office, home):  [facility]  Name of any referring provider: N/A  Names of all persons participating in the telemedicine service and their role in the encounter: Sherrie Mustache, Nurse Practitioner, Carroll Kinds, CMA, and patient.   Time spent on call: 10 minutes with medical assistant

## 2020-05-06 ENCOUNTER — Encounter: Payer: Self-pay | Admitting: Internal Medicine

## 2020-05-21 DIAGNOSIS — E210 Primary hyperparathyroidism: Secondary | ICD-10-CM | POA: Diagnosis not present

## 2020-05-21 DIAGNOSIS — M85852 Other specified disorders of bone density and structure, left thigh: Secondary | ICD-10-CM | POA: Diagnosis not present

## 2020-05-27 ENCOUNTER — Other Ambulatory Visit (HOSPITAL_COMMUNITY): Payer: Self-pay | Admitting: Surgery

## 2020-05-27 DIAGNOSIS — E210 Primary hyperparathyroidism: Secondary | ICD-10-CM

## 2020-05-28 ENCOUNTER — Encounter: Payer: Self-pay | Admitting: Family

## 2020-05-28 ENCOUNTER — Other Ambulatory Visit: Payer: Self-pay

## 2020-05-28 ENCOUNTER — Ambulatory Visit (INDEPENDENT_AMBULATORY_CARE_PROVIDER_SITE_OTHER): Payer: Medicare Other | Admitting: Family

## 2020-05-28 VITALS — BP 120/68 | HR 85 | Temp 96.9°F | Resp 18 | Ht 64.0 in | Wt 226.6 lb

## 2020-05-28 DIAGNOSIS — H1031 Unspecified acute conjunctivitis, right eye: Secondary | ICD-10-CM | POA: Diagnosis not present

## 2020-05-28 MED ORDER — GENTAMICIN SULFATE 0.3 % OP SOLN: 1.0000 [drp] | Freq: Three times a day (TID) | OPHTHALMIC | 0 refills | Status: AC

## 2020-05-28 NOTE — Progress Notes (Signed)
Provider: Torrion Witter FNP-C  Lauree Chandler, NP  Patient Care Team: Lauree Chandler, NP as PCP - General (Geriatric Medicine) Minus Breeding, MD as PCP - Cardiology (Cardiology) Alanda Slim Neena Rhymes, MD as Consulting Physician (Ophthalmology)  Extended Emergency Contact Information Primary Emergency Contact: [facility] Address: [address] [place], [place] [address] [name] of [place] Mobile Phone: [phone] Relation: Spouse  Code Status:  Full Code  Goals of care: Advanced Directive information Advanced Directives 05/28/2020  Does Patient Have a Medical Advance Directive? Yes  Type of Advance Directive Living will;Healthcare Power of [place];Out of facility DNR (pink MOST or yellow form)  Does patient want to make changes to medical advance directive? No - Patient declined  Copy of [place] in Chart? No - copy requested  Would patient like information on creating a medical advance directive? -     Chief Complaint  Patient presents with  . Acute Visit    Complains of mucus in right eye and itching x 3-4 days.    HPI:  Pt is a 70 y.o. female seen today for an acute visit for evaluation of right eye drainage and itching x 3-4 days.Has been waking up with yellow crust on right eye.Has had some redness too which has improved with warm compressor.No trauma/injury to eye,fever or chills. No changes in vision. Has chronic headaches and dizziness.    Past Medical History:  Diagnosis Date  . Arthritis   . Cancer ([place])    melanoma- right leg  . Depression   . GERD (gastroesophageal reflux disease)   . History of hiatal hernia   . Hypercholesterolemia   . Hyperparathyroidism ([place])   . Hypertension   . Sweat, sweating, excessive   . Type 2 diabetes mellitus ([place])    Past Surgical History:  Procedure Laterality Date  . ABDOMINAL HYSTERECTOMY  1979  . APPENDECTOMY  1964  . BREAST EXCISIONAL BIOPSY Left    benign  .  BREAST LUMPECTOMY WITH RADIOACTIVE SEED LOCALIZATION Left 12/31/2014   Procedure: LEFT BREAST LUMPECTOMY WITH RADIOACTIVE SEED LOCALIZATION;  Surgeon: Donnie Mesa, MD;  Location: [facility];  Service: General;  Laterality: Left;  . CARPAL TUNNEL RELEASE Right 1977  . CARPAL TUNNEL RELEASE Left 2007  . CATARACT EXTRACTION Left 04/13/2014   Dr. Herbert Deaner  . CATARACT EXTRACTION Right 06/10/2014   Dr. Herbert Deaner  . CHOLECYSTECTOMY N/A 07/14/2017   Procedure: LAPAROSCOPIC CHOLECYSTECTOMY;  Surgeon: Ileana Roup, MD;  Location: [facility];  Service: General;  Laterality: N/A;  . COLONOSCOPY  2009  . JOINT REPLACEMENT    . MELANOMA EXCISION    . TONSILLECTOMY  1962  . TOTAL KNEE ARTHROPLASTY Left 2012  . TOTAL KNEE ARTHROPLASTY Right 2008    Allergies  Allergen Reactions  . Penicillins     Rash   . Ivp Dye [Iodinated Diagnostic Agents]     Hives   . Percocet [Oxycodone-Acetaminophen]     Itching     Outpatient Encounter Medications as of 05/28/2020  Medication Sig  . amLODipine (NORVASC) 5 MG tablet Take 1 tablet (5 mg total) by mouth daily.  . Cholecalciferol (VITAMIN D3) 25 MCG (1000 UT) CAPS Take 1 capsule by mouth daily.  . Cholecalciferol (VITAMIN D3) 50 MCG (2000 UT) TABS Take 1 tablet by mouth daily.  . clindamycin (CLEOCIN) 300 MG capsule 4 tablets prior to dental work  . fenofibrate (TRICOR) 145 MG tablet Take 1 tablet (  145 mg total) by mouth daily.  Marland Kitchen glipiZIDE (GLUCOTROL) 5 MG tablet TAKE 1 TABLET BY MOUTH TWICE DAILY BEFORE MEAL(S)  . losartan (COZAAR) 100 MG tablet Take 1 tablet (100 mg total) by mouth daily.  Marland Kitchen omeprazole (PRILOSEC) 20 MG capsule Take 1 capsule (20 mg total) by mouth daily.  Marland Kitchen PARoxetine (PAXIL) 30 MG tablet Take 1 tablet by mouth once daily  . rosuvastatin (CRESTOR) 5 MG tablet Take 1 tablet (5 mg total) by mouth 3 (three) times a week.   No facility-administered encounter medications on file as of 05/28/2020.    Review of Systems   Constitutional: Negative for chills, fatigue and fever.  HENT: Negative for congestion, postnasal drip, rhinorrhea, sinus pressure, sinus pain, sneezing and sore throat.   Eyes: Positive for pain, discharge, redness and itching.       Wears eye glasses   Respiratory: Negative for cough, chest tightness, shortness of breath and wheezing.   Skin: Negative for color change, pallor and rash.  Neurological: Negative for light-headedness.       Chronic headache and dizziness     Immunization History  Administered Date(s) Administered  . Fluad Quad(high Dose 65+) 02/05/2019, 03/06/2020  . Influenza Whole 01/26/2013  . Influenza, High Dose Seasonal PF 02/21/2018  . Influenza-Unspecified 01/28/2014, 02/18/2016, 03/01/2017  . PFIZER(Purple Top)SARS-COV-2 Vaccination 05/31/2019, 06/21/2019, 02/04/2020  . Pneumococcal Conjugate-13 12/17/2013  . Pneumococcal Polysaccharide-23 04/09/2013, 04/18/2018  . Tdap 08/17/2009, 10/24/2019  . Zoster 01/09/2011  . Zoster Recombinat (Shingrix) 04/18/2018, 06/25/2018   Pertinent  Health Maintenance Due  Topic Date Due  . COLONOSCOPY (Pts 45-52yrs Insurance coverage will need to be confirmed)  04/14/2020  . FOOT EXAM  04/21/2020  . OPHTHALMOLOGY EXAM  09/01/2020  . HEMOGLOBIN A1C  09/04/2020  . MAMMOGRAM  12/26/2021  . INFLUENZA VACCINE  Completed  . DEXA SCAN  Completed  . PNA vac Low Risk Adult  Completed   Fall Risk  05/28/2020 04/22/2020 01/24/2020 10/23/2019 04/22/2019  Falls in the past year? 0 0 0 0 0  Comment - - - - -  Number falls in past yr: 0 0 0 0 0  Injury with Fall? 0 0 0 0 0   Functional Status Survey:    Vitals:   05/28/20 1444  BP: 120/68  Pulse: 85  Resp: 18  Temp: (!) 96.9 F (36.1 C)  SpO2: 97%  Weight: 226 lb 9.6 oz (102.8 kg)  Height: 5\' 4"  (1.626 m)   Body mass index is 38.9 kg/m. Physical Exam Vitals reviewed.  Constitutional:      General: She is not in acute distress.    Appearance: She is obese. She is not  ill-appearing.  HENT:     Head: Normocephalic.     Nose: Nose normal. No congestion or rhinorrhea.     Mouth/Throat:     Mouth: Mucous membranes are moist.     Pharynx: Oropharynx is clear. No oropharyngeal exudate or posterior oropharyngeal erythema.  Eyes:     General:        Left eye: No discharge.     Extraocular Movements: Extraocular movements intact.     Conjunctiva/sclera:     Right eye: Right conjunctiva is injected. No hemorrhage.    Left eye: Left conjunctiva is not injected. No chemosis or hemorrhage. Neurological:     Mental Status: She is alert.     Labs reviewed: Recent Labs    10/21/19 0901 10/23/19 1108 03/06/20 0852 03/26/20 0833  NA 137  --  138 139  K 4.3  --  4.2 4.5  CL 105  --  105 105  CO2 23  --  24 28  GLUCOSE 120*  --  133* 110*  BUN 18  --  22 21  CREATININE 1.32*  --  1.24* 1.38*  CALCIUM 10.8*  --  10.5* 10.4  MG  --  2.1  --   --    Recent Labs    07/16/19 1030 08/29/19 1116 10/21/19 0901 03/06/20 0852 03/26/20 0833  AST  --   --  17 20  --   ALT  --   --  15 20  --   BILITOT  --   --  0.7 0.5  --   PROT  --   --  7.1 6.8  --   ALBUMIN 4.3 4.4  --   --  4.2   Recent Labs    10/21/19 0901  WBC 6.2  NEUTROABS 3,435  HGB 13.7  HCT 41.5  MCV 86.8  PLT 275   Lab Results  Component Value Date   TSH 0.58 10/17/2017   Lab Results  Component Value Date   HGBA1C 6.6 (H) 03/06/2020   Lab Results  Component Value Date   CHOL 108 03/06/2020   HDL 42 (L) 03/06/2020   LDLCALC 46 03/06/2020   TRIG 117 03/06/2020   CHOLHDL 2.6 03/06/2020    Significant Diagnostic Results in last 30 days:  No results found.  Assessment/Plan   Acute bacterial conjunctivitis of right eye Afebrile.right eye redness with yellow crusty drainage. - cleanse right eye warm water and wash cloth three times prior to applying eye drops.  - Notify provider if symptoms worse or fail to improve  - gentamicin (GARAMYCIN) 0.3 % ophthalmic solution;  Place 1 drop into the right eye 3 (three) times daily for 7 days.  Dispense: 15 mL; Refill: 0  Family/ staff Communication: Reviewed plan of care with patient  Labs/tests ordered: None   Next Appointment: As needed if symptoms worsen or fail to improve   Sandrea Hughs, NP

## 2020-05-28 NOTE — Patient Instructions (Signed)
-   cleanse right eye warm water and wash cloth three times prior to applying eye drops.  - Notify provider if symptoms worse or fail to improve

## 2020-05-29 ENCOUNTER — Other Ambulatory Visit: Payer: Self-pay | Admitting: Nurse Practitioner

## 2020-05-29 DIAGNOSIS — E785 Hyperlipidemia, unspecified: Secondary | ICD-10-CM

## 2020-06-02 ENCOUNTER — Ambulatory Visit: Payer: Medicare Other | Admitting: *Deleted

## 2020-06-02 ENCOUNTER — Other Ambulatory Visit: Payer: Self-pay

## 2020-06-02 VITALS — Ht 64.0 in | Wt 226.0 lb

## 2020-06-02 DIAGNOSIS — Z86010 Personal history of colonic polyps: Secondary | ICD-10-CM

## 2020-06-02 MED ORDER — SUTAB 1479-225-188 MG PO TABS: 1.0000 | ORAL_TABLET | Freq: Once | ORAL | 0 refills | Status: AC

## 2020-06-02 MED ORDER — SUTAB 1479-225-188 MG PO TABS: 1.0000 | ORAL_TABLET | Freq: Once | ORAL | 0 refills | Status: DC

## 2020-06-02 NOTE — Progress Notes (Signed)

## 2020-06-05 ENCOUNTER — Other Ambulatory Visit: Payer: Self-pay

## 2020-06-05 ENCOUNTER — Ambulatory Visit (HOSPITAL_COMMUNITY)
Admission: RE | Admit: 2020-06-05 | Discharge: 2020-06-05 | Disposition: A | Discharge status: Home / Self Care | Payer: Medicare Other | Source: Ambulatory Visit | Attending: Surgery | Admitting: Surgery

## 2020-06-05 DIAGNOSIS — E042 Nontoxic multinodular goiter: Secondary | ICD-10-CM | POA: Diagnosis not present

## 2020-06-05 DIAGNOSIS — E210 Primary hyperparathyroidism: Secondary | ICD-10-CM | POA: Insufficient documentation

## 2020-06-05 DIAGNOSIS — E213 Hyperparathyroidism, unspecified: Secondary | ICD-10-CM | POA: Diagnosis not present

## 2020-06-05 MED ORDER — TECHNETIUM TC 99M SESTAMIBI GENERIC - CARDIOLITE
25.0000 | Freq: Once | INTRAVENOUS | Status: AC | PRN
  Administered 2020-06-05: 25 via INTRAVENOUS

## 2020-06-06 ENCOUNTER — Other Ambulatory Visit: Payer: Self-pay | Admitting: Nurse Practitioner

## 2020-06-06 DIAGNOSIS — E785 Hyperlipidemia, unspecified: Secondary | ICD-10-CM

## 2020-06-12 ENCOUNTER — Other Ambulatory Visit: Payer: Self-pay | Admitting: Surgery

## 2020-06-12 DIAGNOSIS — E210 Primary hyperparathyroidism: Secondary | ICD-10-CM

## 2020-06-13 DIAGNOSIS — N390 Urinary tract infection, site not specified: Secondary | ICD-10-CM | POA: Diagnosis not present

## 2020-06-13 DIAGNOSIS — R300 Dysuria: Secondary | ICD-10-CM | POA: Diagnosis not present

## 2020-06-13 DIAGNOSIS — R350 Frequency of micturition: Secondary | ICD-10-CM | POA: Diagnosis not present

## 2020-06-15 ENCOUNTER — Other Ambulatory Visit: Payer: Self-pay | Admitting: Surgery

## 2020-06-15 ENCOUNTER — Telehealth: Payer: Self-pay

## 2020-06-15 ENCOUNTER — Other Ambulatory Visit: Payer: Self-pay

## 2020-06-15 MED ORDER — PREDNISONE 50 MG PO TABS: ORAL_TABLET | ORAL | 0 refills | Status: DC

## 2020-06-15 NOTE — Telephone Encounter (Signed)
Called pt and reviewed allergies.  Contrast allergy is hives.  Reviewed 13 hour prep.  She is scheduled for 06/23/20 @ 8am for CT parathyroid 4 D neck WWO contrast.  escribed prednisone to [facility] on [employer] [address].  Pt voiced understanding.  She is a Marine scientist.

## 2020-06-16 ENCOUNTER — Ambulatory Visit (AMBULATORY_SURGERY_CENTER): Payer: Medicare Other | Admitting: Internal Medicine

## 2020-06-16 ENCOUNTER — Encounter: Payer: Self-pay | Admitting: Internal Medicine

## 2020-06-16 ENCOUNTER — Other Ambulatory Visit: Payer: Self-pay

## 2020-06-16 VITALS — BP 139/70 | HR 72 | Temp 97.3°F | Resp 22 | Ht 64.0 in | Wt 226.0 lb

## 2020-06-16 DIAGNOSIS — Z86010 Personal history of colonic polyps: Secondary | ICD-10-CM

## 2020-06-16 DIAGNOSIS — I10 Essential (primary) hypertension: Secondary | ICD-10-CM | POA: Diagnosis not present

## 2020-06-16 DIAGNOSIS — D123 Benign neoplasm of transverse colon: Secondary | ICD-10-CM | POA: Diagnosis not present

## 2020-06-16 DIAGNOSIS — E119 Type 2 diabetes mellitus without complications: Secondary | ICD-10-CM | POA: Diagnosis not present

## 2020-06-16 DIAGNOSIS — K219 Gastro-esophageal reflux disease without esophagitis: Secondary | ICD-10-CM | POA: Diagnosis not present

## 2020-06-16 DIAGNOSIS — D122 Benign neoplasm of ascending colon: Secondary | ICD-10-CM

## 2020-06-16 MED ORDER — SODIUM CHLORIDE 0.9 % IV SOLN: 500.0000 mL | Freq: Once | INTRAVENOUS | Status: DC

## 2020-06-16 NOTE — Progress Notes (Signed)
Vital signs checked by: CW  The patient states no changes in medical or surgical history since pre-visit screening on 06/02/2020.

## 2020-06-16 NOTE — Progress Notes (Signed)
Report to PACU, RN, vss, BBS= Clear.  

## 2020-06-16 NOTE — Progress Notes (Signed)
Called to room to assist during endoscopic procedure.  Patient ID and intended procedure confirmed with present staff. Received instructions for my participation in the procedure from the performing physician.  

## 2020-06-16 NOTE — Op Note (Signed)
[facility] Patient Name: Jill Garza Procedure Date: 06/16/2020 12:32 PM MRN: [id number] Endoscopist: Docia Chuck. Henrene Pastor , MD Age: 70 Referring MD:  Date of Birth: [birth year] Gender: Female Account #: [id number] Procedure:                Colonoscopy with cold snare polypectomy x 2 Indications:              High risk colon cancer surveillance: Personal                            history of adenoma with villous component, High                            risk colon cancer surveillance: Personal history of                            multiple (3 or more) adenomas Medicines:                Monitored Anesthesia Care Procedure:                Pre-Anesthesia Assessment:                           - Prior to the procedure, a History and Physical                            was performed, and patient medications and                            allergies were reviewed. The patient's tolerance of                            previous anesthesia was also reviewed. The risks                            and benefits of the procedure and the sedation                            options and risks were discussed with the patient.                            All questions were answered, and informed consent                            was obtained. Prior Anticoagulants: The patient has                            taken no previous anticoagulant or antiplatelet                            agents. After reviewing the risks and benefits, the                            patient was deemed in satisfactory condition to  undergo the procedure.                           After obtaining informed consent, the colonoscope                            was passed under direct vision. Throughout the                            procedure, the patient's blood pressure, pulse, and                            oxygen saturations were monitored continuously. The                            Colonoscope was  introduced through the anus and                            advanced to the the cecum, identified by                            appendiceal orifice and ileocecal valve. The                            ileocecal valve, appendiceal orifice, and rectum                            were photographed. The quality of the bowel                            preparation was excellent. The colonoscopy was                            performed without difficulty. The patient tolerated                            the procedure well. The bowel preparation used was                            SUPREP via split dose instruction. Scope In: 1:27:52 PM Scope Out: 1:47:51 PM Scope Withdrawal Time: 0 hours 16 minutes 56 seconds  Total Procedure Duration: 0 hours 19 minutes 59 seconds  Findings:                 Two polyps were found in the transverse colon and                            ascending colon. The polyps were 2 to 3 mm in size.                            These polyps were removed with a cold snare.                            Resection and retrieval were complete.  Multiple diverticula were found in the sigmoid                            colon.                           The exam was otherwise without abnormality on                            direct and retroflexion views. Complications:            No immediate complications. Estimated blood loss:                            None. Estimated Blood Loss:     Estimated blood loss: none. Impression:               - Two 2 to 3 mm polyps in the transverse colon and                            in the ascending colon, removed with a cold snare.                            Resected and retrieved.                           - Diverticulosis in the sigmoid colon.                           - The examination was otherwise normal on direct                            and retroflexion views. Recommendation:           - Repeat colonoscopy in 5 years for  surveillance.                           - Patient has a contact number available for                            emergencies. The signs and symptoms of potential                            delayed complications were discussed with the                            patient. Return to normal activities tomorrow.                            Written discharge instructions were provided to the                            patient.                           - Resume previous diet.                           -  Continue present medications.                           - Await pathology results. Docia Chuck. Henrene Pastor, MD 06/16/2020 2:01:47 PM This report has been signed electronically.

## 2020-06-16 NOTE — Patient Instructions (Signed)
Resume previous diet Continue current medications Await pathology results  YOU HAD AN ENDOSCOPIC PROCEDURE TODAY AT THE [facility]:   Refer to the procedure report that was given to you for any specific questions about what was found during the examination.  If the procedure report does not answer your questions, please call your gastroenterologist to clarify.  If you requested that your care partner not be given the details of your procedure findings, then the procedure report has been included in a sealed envelope for you to review at your convenience later.  YOU SHOULD EXPECT: Some feelings of bloating in the abdomen. Passage of more gas than usual.  Walking can help get rid of the air that was put into your GI tract during the procedure and reduce the bloating. If you had a lower endoscopy (such as a colonoscopy or flexible sigmoidoscopy) you may notice spotting of blood in your stool or on the toilet paper. If you underwent a bowel prep for your procedure, you may not have a normal bowel movement for a few days.  Please Note:  You might notice some irritation and congestion in your nose or some drainage.  This is from the oxygen used during your procedure.  There is no need for concern and it should clear up in a day or so.  SYMPTOMS TO REPORT IMMEDIATELY:  Following lower endoscopy (colonoscopy or flexible sigmoidoscopy):  Excessive amounts of blood in the stool  Significant tenderness or worsening of abdominal pains  Swelling of the abdomen that is new, acute  Fever of 100F or higher  For urgent or emergent issues, a gastroenterologist can be reached at any hour by calling [phone]. Do not use MyChart messaging for urgent concerns.   DIET:  We do recommend a small meal at first, but then you may proceed to your regular diet.  Drink plenty of fluids but you should avoid alcoholic beverages for 24 hours.  ACTIVITY:  You should plan to take it easy for the rest of  today and you should NOT DRIVE or use heavy machinery until tomorrow (because of the sedation medicines used during the test).    FOLLOW UP: Our staff will call the number listed on your records 48-72 hours following your procedure to check on you and address any questions or concerns that you may have regarding the information given to you following your procedure. If we do not reach you, we will leave a message.  We will attempt to reach you two times.  During this call, we will ask if you have developed any symptoms of COVID 19. If you develop any symptoms (ie: fever, flu-like symptoms, shortness of breath, cough etc.) before then, please call [phone].  If you test positive for Covid 19 in the 2 weeks post procedure, please call and report this information to us.    If any biopsies were taken you will be contacted by phone or by letter within the next 1-3 weeks.  Please call us at [phone] if you have not heard about the biopsies in 3 weeks.   SIGNATURES/CONFIDENTIALITY: You and/or your care partner have signed paperwork which will be entered into your electronic medical record.  These signatures attest to the fact that that the information above on your After Visit Summary has been reviewed and is understood.  Full responsibility of the confidentiality of this discharge information lies with you and/or your care-partner.  

## 2020-06-18 ENCOUNTER — Telehealth: Payer: Self-pay

## 2020-06-18 NOTE — Telephone Encounter (Signed)
  Follow up Call-  Call back number 06/16/2020  Post procedure Call Back phone  # [phone]  Permission to leave phone message Yes  Some recent data might be hidden     Patient questions:  Do you have a fever, pain , or abdominal swelling? No. Pain Score  0 *  Have you tolerated food without any problems? Yes.    Have you been able to return to your normal activities? Yes.    Do you have any questions about your discharge instructions: Diet   No. Medications  No. Follow up visit  No.  Do you have questions or concerns about your Care? No.  Actions: * If pain score is 4 or above: No action needed, pain <4. 1. Have you developed a fever since your procedure? no  2.   Have you had an respiratory symptoms (SOB or cough) since your procedure? no  3.   Have you tested positive for COVID 19 since your procedure no  4.   Have you had any family members/close contacts diagnosed with the COVID 19 since your procedure?  no   If yes to any of these questions please route to Joylene John, RN and Joella Prince, RN

## 2020-06-23 ENCOUNTER — Ambulatory Visit
Admission: RE | Admit: 2020-06-23 | Discharge: 2020-06-23 | Disposition: A | Discharge status: Home / Self Care | Payer: Medicare Other | Source: Ambulatory Visit | Attending: Surgery | Admitting: Surgery

## 2020-06-23 ENCOUNTER — Other Ambulatory Visit: Payer: Self-pay

## 2020-06-23 ENCOUNTER — Encounter: Payer: Self-pay | Admitting: Internal Medicine

## 2020-06-23 DIAGNOSIS — E210 Primary hyperparathyroidism: Secondary | ICD-10-CM

## 2020-06-23 DIAGNOSIS — J8410 Pulmonary fibrosis, unspecified: Secondary | ICD-10-CM | POA: Diagnosis not present

## 2020-06-23 MED ORDER — IOPAMIDOL (ISOVUE-370) INJECTION 76%
80.0000 mL | Freq: Once | INTRAVENOUS | Status: AC | PRN
  Administered 2020-06-23: 80 mL via INTRAVENOUS

## 2020-07-09 ENCOUNTER — Ambulatory Visit: Payer: Self-pay | Admitting: Surgery

## 2020-07-13 ENCOUNTER — Other Ambulatory Visit: Payer: Self-pay | Admitting: Nurse Practitioner

## 2020-07-13 DIAGNOSIS — E119 Type 2 diabetes mellitus without complications: Secondary | ICD-10-CM

## 2020-07-13 DIAGNOSIS — I10 Essential (primary) hypertension: Secondary | ICD-10-CM

## 2020-07-24 ENCOUNTER — Ambulatory Visit (INDEPENDENT_AMBULATORY_CARE_PROVIDER_SITE_OTHER): Payer: Medicare Other | Admitting: Nurse Practitioner

## 2020-07-24 ENCOUNTER — Encounter: Payer: Self-pay | Admitting: Nurse Practitioner

## 2020-07-24 ENCOUNTER — Ambulatory Visit: Payer: Medicare Other | Admitting: Nurse Practitioner

## 2020-07-24 ENCOUNTER — Other Ambulatory Visit: Payer: Self-pay

## 2020-07-24 VITALS — BP 120/70 | HR 91 | Temp 97.3°F | Ht 64.0 in | Wt 227.8 lb

## 2020-07-24 DIAGNOSIS — E1140 Type 2 diabetes mellitus with diabetic neuropathy, unspecified: Secondary | ICD-10-CM

## 2020-07-24 DIAGNOSIS — G8929 Other chronic pain: Secondary | ICD-10-CM

## 2020-07-24 DIAGNOSIS — I10 Essential (primary) hypertension: Secondary | ICD-10-CM

## 2020-07-24 DIAGNOSIS — E785 Hyperlipidemia, unspecified: Secondary | ICD-10-CM | POA: Diagnosis not present

## 2020-07-24 DIAGNOSIS — M5442 Lumbago with sciatica, left side: Secondary | ICD-10-CM | POA: Diagnosis not present

## 2020-07-24 NOTE — Progress Notes (Signed)
Careteam: Patient Care Team: Lauree Chandler, NP as PCP - General (Geriatric Medicine) Minus Breeding, MD as PCP - Cardiology (Cardiology) Mincey, Neena Rhymes, MD as Consulting Physician (Ophthalmology)  PLACE OF SERVICE:  [facility] Directive information Does Patient Have a Medical Advance Directive?: Yes, Type of Advance Directive: [place];Living will;Out of facility DNR (pink MOST or yellow form), Does patient want to make changes to medical advance directive?: No - Patient declined  Allergies  Allergen Reactions  . Penicillins     Rash   . Ivp Dye [Iodinated Diagnostic Agents]     Hives   . Percocet [Oxycodone-Acetaminophen]     Itching     Chief Complaint  Patient presents with  . Medical Management of Chronic Issues    6 month follow up. Discuss need for foot exam.     HPI: Patient is a 70 y.o. female for routine follow up  Plan to have parathryoid removed in May Gets red easily.   Hypertension- blood pressure stable on norvasc 5 mg daily   DM- blood sugars 135-148 fasting. Glipizide 5 mg twice daily. Not on metformin due to renal issue.   GERD- controlled on omeprazole.  Had UTI in February and conjunctivitis.    Had colonoscopy in January- follow up in 5 years.   low back pain- getting worse- when she had knee check in 2014 she had her back checked and there was some disc disease. Has sciatica that comes and goes.  Stands in the kitchen to cook and back hurts.    Review of Systems:  Review of Systems  Constitutional: Negative for chills, fever and weight loss.  HENT: Negative for tinnitus.   Respiratory: Negative for cough, sputum production and shortness of breath.   Cardiovascular: Negative for chest pain, palpitations and leg swelling.  Gastrointestinal: Negative for abdominal pain, constipation, diarrhea and heartburn.  Genitourinary: Negative for dysuria, frequency and urgency.  Musculoskeletal: Positive  for joint pain and myalgias. Negative for back pain and falls.  Skin: Negative.   Neurological: Negative for dizziness and headaches.  Psychiatric/Behavioral: Negative for depression and memory loss. The patient does not have insomnia.     Past Medical History:  Diagnosis Date  . Arthritis   . Cancer ([place])    melanoma- right leg  . Depression   . GERD (gastroesophageal reflux disease)   . History of hiatal hernia   . Hypercholesterolemia   . Hyperparathyroidism ([place])   . Hypertension   . Sweat, sweating, excessive   . Type 2 diabetes mellitus ([place])    Past Surgical History:  Procedure Laterality Date  . ABDOMINAL HYSTERECTOMY  1979  . APPENDECTOMY  1964  . BREAST EXCISIONAL BIOPSY Left    benign  . BREAST LUMPECTOMY WITH RADIOACTIVE SEED LOCALIZATION Left 12/31/2014   Procedure: LEFT BREAST LUMPECTOMY WITH RADIOACTIVE SEED LOCALIZATION;  Surgeon: Donnie Mesa, MD;  Location: [facility];  Service: General;  Laterality: Left;  . CARPAL TUNNEL RELEASE Right 1977  . CARPAL TUNNEL RELEASE Left 2007  . CATARACT EXTRACTION Left 04/13/2014   Dr. Herbert Deaner  . CATARACT EXTRACTION Right 06/10/2014   Dr. Herbert Deaner  . CHOLECYSTECTOMY N/A 07/14/2017   Procedure: LAPAROSCOPIC CHOLECYSTECTOMY;  Surgeon: Ileana Roup, MD;  Location: [facility];  Service: General;  Laterality: N/A;  . COLONOSCOPY  2009  . JOINT REPLACEMENT    . MELANOMA EXCISION    . TONSILLECTOMY  1962  . TOTAL KNEE ARTHROPLASTY Left 2012  .  TOTAL KNEE ARTHROPLASTY Right 2008   Social History:   reports that she quit smoking about 34 years ago. Her smoking use included cigarettes. She has a 15.00 pack-year smoking history. She has never used smokeless tobacco. She reports current alcohol use. She reports that she does not use drugs.  Family History  Problem Relation Age of Onset  . Heart attack Father        Died of MI age 54  . Pulmonary embolism Mother        Multiple, B/L   . Hypertension Brother   .  Heart disease Brother 57  . Diabetes Brother   . High Cholesterol Brother   . Hypertension Brother   . High Cholesterol Brother   . Hypertension Sister   . Diabetes type II Sister   . High Cholesterol Sister   . Alcohol abuse Brother   . Reye's syndrome Daughter   . Hepatitis Brother   . Stroke Other   . Cancer Maternal Grandmother 15  . Colon cancer Neg Hx   . Esophageal cancer Neg Hx   . Stomach cancer Neg Hx   . Rectal cancer Neg Hx     Medications: Patient's Medications  New Prescriptions   No medications on file  Previous Medications   AMLODIPINE (NORVASC) 5 MG TABLET    Take 1 tablet (5 mg total) by mouth daily.   CHOLECALCIFEROL (VITAMIN D3) 25 MCG (1000 UT) CAPS    Take 1 capsule by mouth daily.   CHOLECALCIFEROL (VITAMIN D3) 50 MCG (2000 UT) TABS    Take 1 tablet by mouth daily.   CLINDAMYCIN (CLEOCIN) 300 MG CAPSULE    4 tablets prior to dental work   FENOFIBRATE (TRICOR) 145 MG TABLET    Take 1 tablet by mouth once daily   GLIPIZIDE (GLUCOTROL) 5 MG TABLET    TAKE 1 TABLET BY MOUTH TWICE DAILY BEFORE MEAL(S)   LOSARTAN (COZAAR) 100 MG TABLET    Take 1 tablet by mouth once daily   OMEPRAZOLE (PRILOSEC) 20 MG CAPSULE    Take 1 capsule (20 mg total) by mouth daily.   PAROXETINE (PAXIL) 30 MG TABLET    Take 1 tablet by mouth once daily   ROSUVASTATIN (CRESTOR) 5 MG TABLET    TAKE 1 TABLET BY MOUTH 3 TIMES A WEEK  Modified Medications   No medications on file  Discontinued Medications   PREDNISONE (DELTASONE) 50 MG TABLET    Take 50mg  prednisone on 06/22/20 @ 7pm.  Take 50mg  prednisone on 06/23/20 @ 1am.  Take 50mg  prednisone on 06/23/20 @ 7am AND 50mg  benadryl.  *pt prefers to purchase benadryl OTC   SULFAMETHOXAZOLE-TRIMETHOPRIM (BACTRIM DS) 800-160 MG TABLET    Take 1 tablet by mouth 2 (two) times daily.    Physical Exam:  Vitals:   07/24/20 1448  BP: 120/70  Pulse: 91  Temp: (!) 97.3 F (36.3 C)  SpO2: 98%  Weight: 227 lb 12.8 oz (103.3 kg)  Height: 5\' 4"   (1.626 m)   Body mass index is 39.1 kg/m. Wt Readings from Last 3 Encounters:  07/24/20 227 lb 12.8 oz (103.3 kg)  06/16/20 226 lb (102.5 kg)  06/02/20 226 lb (102.5 kg)    Physical Exam Constitutional:      General: She is not in acute distress.    Appearance: She is well-developed. She is not diaphoretic.  HENT:     Head: Normocephalic and atraumatic.     Mouth/Throat:     Pharynx: No  oropharyngeal exudate.  Eyes:     Conjunctiva/sclera: Conjunctivae normal.     Pupils: Pupils are equal, round, and reactive to light.  Cardiovascular:     Rate and Rhythm: Normal rate and regular rhythm.     Heart sounds: Normal heart sounds.  Pulmonary:     Effort: Pulmonary effort is normal.     Breath sounds: Normal breath sounds.  Abdominal:     General: Bowel sounds are normal.     Palpations: Abdomen is soft.  Musculoskeletal:        General: No tenderness.     Cervical back: Normal range of motion and neck supple.       Back:  Skin:    General: Skin is warm and dry.  Neurological:     Mental Status: She is alert and oriented to person, place, and time.     Labs reviewed: Basic Metabolic Panel: Recent Labs    10/21/19 0901 10/23/19 1108 03/06/20 0852 03/26/20 0833  NA 137  --  138 139  K 4.3  --  4.2 4.5  CL 105  --  105 105  CO2 23  --  24 28  GLUCOSE 120*  --  133* 110*  BUN 18  --  22 21  CREATININE 1.32*  --  1.24* 1.38*  CALCIUM 10.8*  --  10.5* 10.4  MG  --  2.1  --   --    Liver Function Tests: Recent Labs    08/29/19 1116 10/21/19 0901 03/06/20 0852 03/26/20 0833  AST  --  17 20  --   ALT  --  15 20  --   BILITOT  --  0.7 0.5  --   PROT  --  7.1 6.8  --   ALBUMIN 4.4  --   --  4.2   No results for input(s): LIPASE, AMYLASE in the last 8760 hours. No results for input(s): AMMONIA in the last 8760 hours. CBC: Recent Labs    10/21/19 0901  WBC 6.2  NEUTROABS 3,435  HGB 13.7  HCT 41.5  MCV 86.8  PLT 275   Lipid Panel: Recent Labs     10/21/19 0901 03/06/20 0852  CHOL 141 108  HDL 46* 42*  LDLCALC 75 46  TRIG 115 117  CHOLHDL 3.1 2.6   TSH: No results for input(s): TSH in the last 8760 hours. A1C: Lab Results  Component Value Date   HGBA1C 6.6 (H) 03/06/2020     Assessment/Plan 1. Type 2 diabetes mellitus with diabetic neuropathy, without long-term current use of insulin (HCC) -continues on glipizide 5 mg BID with meals. Encouraged dietary compliance, routine foot care/monitoring and to keep up with diabetic eye exams through ophthalmology  - Hemoglobin A1c  2. Hyperlipidemia LDL goal <70 -continues on fenofibrate with crestor 5 mg daily, LDL at goal on current regimen.  - COMPLETE METABOLIC PANEL WITH GFR  3. Essential hypertension, benign -well controlled on norvasc 5 mg daily and losartan 100 mg daily with dietary modifications. - COMPLETE METABOLIC PANEL WITH GFR - CBC with Differential/Platelet  4. Morbid obesity ([place]) -BMI of 39 with diabetes, hypertension and hyperlipidemia. Discussed increase in physical activity and healthy dietary changes. Mobility limited due to chronic low back pain.   5. Chronic left-sided low back pain with left-sided sciatica ongoing - Ambulatory referral to Physical Therapy for further evaluation and treatment.   Next appt: 6 months Calden Dorsey K. [place], [facility] [phone]

## 2020-07-25 LAB — CBC WITH DIFFERENTIAL/PLATELET
Absolute Monocytes: 345 cells/uL (ref 200–950)
Basophils Absolute: 38 cells/uL (ref 0–200)
Basophils Relative: 0.5 %
Eosinophils Absolute: 60 cells/uL (ref 15–500)
Eosinophils Relative: 0.8 %
HCT: 41.1 % (ref 35.0–45.0)
Hemoglobin: 13.5 g/dL (ref 11.7–15.5)
Lymphs Abs: 2858 cells/uL (ref 850–3900)
MCH: 28.7 pg (ref 27.0–33.0)
MCHC: 32.8 g/dL (ref 32.0–36.0)
MCV: 87.4 fL (ref 80.0–100.0)
MPV: 9.8 fL (ref 7.5–12.5)
Monocytes Relative: 4.6 %
Neutro Abs: 4200 cells/uL (ref 1500–7800)
Neutrophils Relative %: 56 %
Platelets: 268 10*3/uL (ref 140–400)
RBC: 4.7 10*6/uL (ref 3.80–5.10)
RDW: 12.3 % (ref 11.0–15.0)
Total Lymphocyte: 38.1 %
WBC: 7.5 10*3/uL (ref 3.8–10.8)

## 2020-07-25 LAB — COMPLETE METABOLIC PANEL WITH GFR
AG Ratio: 1.8 (calc) (ref 1.0–2.5)
ALT: 18 U/L (ref 6–29)
AST: 19 U/L (ref 10–35)
Albumin: 4.4 g/dL (ref 3.6–5.1)
Alkaline phosphatase (APISO): 73 U/L (ref 37–153)
BUN/Creatinine Ratio: 19 (calc) (ref 6–22)
BUN: 25 mg/dL (ref 7–25)
CO2: 23 mmol/L (ref 20–32)
Calcium: 10.9 mg/dL — ABNORMAL HIGH (ref 8.6–10.4)
Chloride: 106 mmol/L (ref 98–110)
Creat: 1.3 mg/dL — ABNORMAL HIGH (ref 0.60–0.93)
GFR, Est African American: 48 mL/min/{1.73_m2} — ABNORMAL LOW (ref >=60)
GFR, Est Non African American: 41 mL/min/{1.73_m2} — ABNORMAL LOW (ref >=60)
Globulin: 2.4 g/dL (calc) (ref 1.9–3.7)
Glucose, Bld: 142 mg/dL — ABNORMAL HIGH (ref 65–139)
Potassium: 4.2 mmol/L (ref 3.5–5.3)
Sodium: 140 mmol/L (ref 135–146)
Total Bilirubin: 0.3 mg/dL (ref 0.2–1.2)
Total Protein: 6.8 g/dL (ref 6.1–8.1)

## 2020-07-25 LAB — HEMOGLOBIN A1C
Hgb A1c MFr Bld: 6.7 % of total Hgb — ABNORMAL HIGH (ref <5.7)
Mean Plasma Glucose: 146 mg/dL
eAG (mmol/L): 8.1 mmol/L

## 2020-08-07 DIAGNOSIS — Z23 Encounter for immunization: Secondary | ICD-10-CM | POA: Diagnosis not present

## 2020-08-12 ENCOUNTER — Ambulatory Visit: Payer: Medicare Other | Attending: Nurse Practitioner

## 2020-08-12 ENCOUNTER — Other Ambulatory Visit: Payer: Self-pay

## 2020-08-12 DIAGNOSIS — G8929 Other chronic pain: Secondary | ICD-10-CM | POA: Diagnosis not present

## 2020-08-12 DIAGNOSIS — M25561 Pain in right knee: Secondary | ICD-10-CM | POA: Diagnosis not present

## 2020-08-12 DIAGNOSIS — M5442 Lumbago with sciatica, left side: Secondary | ICD-10-CM | POA: Diagnosis not present

## 2020-08-12 DIAGNOSIS — M6281 Muscle weakness (generalized): Secondary | ICD-10-CM | POA: Diagnosis not present

## 2020-08-12 DIAGNOSIS — R293 Abnormal posture: Secondary | ICD-10-CM | POA: Diagnosis not present

## 2020-08-12 DIAGNOSIS — R252 Cramp and spasm: Secondary | ICD-10-CM | POA: Insufficient documentation

## 2020-08-12 NOTE — Patient Instructions (Signed)
Access Code: [email or web address] URL: [email or web address] Date: 08/12/2020 Prepared by: Sherlynn Stalls  Exercises Supine Lower Trunk Rotation - 1 x daily - 7 x weekly - 2 sets - 10 reps Hooklying Single Knee to Chest Stretch - 1 x daily - 7 x weekly - 4 sets - 30 seconds hold Sit to Stand - 1 x daily - 7 x weekly - 2 sets - 10 reps Supine Piriformis Stretch with Leg Straight - 1 x daily - 7 x weekly - 4 sets - 30 seconds hold Seated Piriformis Stretch - 1 x daily - 7 x weekly - 4 sets - 30 seconds hold

## 2020-08-13 NOTE — Therapy (Signed)
[place]. [place], [place], [address] Phone: [phone]   Fax:  [phone]  Physical Therapy Evaluation  Patient Details  Name: Jill Garza MRN: [id number] Date of Birth: 06-Jan-1951 Referring Provider (PT): Lauree Chandler, NP   Encounter Date: 08/12/2020   PT End of Session - 08/12/20 1444    Visit Number 1    Number of Visits 13    Date for PT Re-Evaluation 10/09/20    Authorization Type Medicare & AARP    PT Start Time 1400    PT Stop Time 1445    PT Time Calculation (min) 45 min    Activity Tolerance Patient tolerated treatment well;Patient limited by pain    Behavior During Therapy [facility] for tasks assessed/performed           Past Medical History:  Diagnosis Date  . Arthritis   . Cancer ([place])    melanoma- right leg  . Depression   . GERD (gastroesophageal reflux disease)   . History of hiatal hernia   . Hypercholesterolemia   . Hyperparathyroidism ([place])   . Hypertension   . Sweat, sweating, excessive   . Type 2 diabetes mellitus ([place])     Past Surgical History:  Procedure Laterality Date  . ABDOMINAL HYSTERECTOMY  1979  . APPENDECTOMY  1964  . BREAST EXCISIONAL BIOPSY Left    benign  . BREAST LUMPECTOMY WITH RADIOACTIVE SEED LOCALIZATION Left 12/31/2014   Procedure: LEFT BREAST LUMPECTOMY WITH RADIOACTIVE SEED LOCALIZATION;  Surgeon: Donnie Mesa, MD;  Location: [place];  Service: General;  Laterality: Left;  . CARPAL TUNNEL RELEASE Right 1977  . CARPAL TUNNEL RELEASE Left 2007  . CATARACT EXTRACTION Left 04/13/2014   Dr. Herbert Deaner  . CATARACT EXTRACTION Right 06/10/2014   Dr. Herbert Deaner  . CHOLECYSTECTOMY N/A 07/14/2017   Procedure: LAPAROSCOPIC CHOLECYSTECTOMY;  Surgeon: Ileana Roup, MD;  Location: [facility];  Service: General;  Laterality: N/A;  . COLONOSCOPY  2009  . JOINT REPLACEMENT    . MELANOMA EXCISION    . TONSILLECTOMY  1962  . TOTAL KNEE ARTHROPLASTY  Left 2012  . TOTAL KNEE ARTHROPLASTY Right 2008    There were no vitals filed for this visit.    Subjective Assessment - 08/12/20 1405    Subjective Long standing history of low back pain into left side (starting in late 30s). Walking is the biggest trigger right now. Pain feels like a knot, grabbing and goes into the knee and lower leg. Also slipped with heel about 2 months ago as well so has had increased medial right knee pain since.    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 Melanoma which was excised.    Limitations Standing;Walking    How long can you stand comfortably? 5-15 minutes depending on the day, alot of pain if standing prolonged for cooking    How long can you walk comfortably? less than 2-3 minutes    Patient Stated Goals to be able to [place] through an airport, walk the dog    Currently in Pain? Yes    Pain Score 2     Pain Location Back    Pain Orientation Lower    Pain Descriptors / Indicators Spasm    Pain Type Chronic pain    Pain Radiating Towards LLE    Pain Onset More than a month ago    Pain Frequency Intermittent  Aggravating Factors  standing and walking    Pain Relieving Factors stretching back, taking weight off of it.              [facility] PT Assessment - 08/12/20 1403      Assessment   Medical Diagnosis M54.42,G89.29 (ICD-10-CM) - Chronic left-sided low back pain with left-sided sciatica    Referring Provider (PT) Lauree Chandler, NP    Hand Dominance Right    Next MD Visit September 2022    Prior Therapy none      Balance Screen   Has the patient fallen in the past 6 months Yes    How many times? 1 slip while holding puppy    Has the patient had a decrease in activity level because of a fear of falling?  No    Is the patient reluctant to leave their home because of a fear of falling?  No      Home Environment   Additional  Comments House , husband and dog. 3 steps to enter +1 + 1. Back steps with deck 4 + 4 steps. 8 x 2 inside home with 1 sided railing on one flight, B HR other.      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 left side bend to joint line. Extension limited 50% tight. FF almost to floor.      Strength   Overall Strength Comments hip flexion left 4/5, right 4+/5. Knee extension 5/5 on right with medial knee pain, 4+/5 on left no pain. B knee flexion 5/5. B DF 5/5      Flexibility   Soft Tissue Assessment /Muscle Length --   tight HS, prirformis B     Special Tests   Other special tests SLR + LLE with DF.      Ambulation/Gait   Gait Pattern Decreased arm swing - right;Decreased arm swing - left;Decreased stance time - left;Trendelenburg;Lateral trunk lean to right;Lateral trunk lean to left;Decreased trunk rotation      Standardized Balance Assessment   Standardized Balance Assessment Timed Up and Go Test      Timed Up and Go Test   Normal TUG (seconds) 7      High Level Balance   High Level Balance Comments 5TSTS 14 seconds                      Objective measurements completed on examination: See above findings.               PT Education - 08/12/20 1813    Education Details Initial PT POC and HEP. Access Code: [email or web address]  URL: [email or web address]  Date: 08/12/2020  Prepared by: Sherlynn Stalls    Exercises  Supine Lower Trunk Rotation - 1 x daily - 7 x weekly - 2 sets - 10 reps  Hooklying Single Knee to Chest Stretch -  1 x daily - 7 x weekly - 4 sets - 30 seconds hold  Sit to Stand - 1 x daily - 7 x weekly - 2 sets - 10 reps  Supine Piriformis Stretch with Leg Straight - 1 x daily - 7 x weekly - 4 sets - 30 seconds hold  Seated Piriformis Stretch - 1 x daily - 7 x weekly - 4 sets - 30 seconds hold    Person(s) Educated Patient    Methods Explanation;Demonstration;Handout    Comprehension Verbalized understanding;Returned demonstration            PT Short Term Goals - 08/12/20 1819      PT SHORT TERM GOAL #1   Title Independent with initial HEP    Time 3    Period Weeks    Status New    Target Date 09/02/20             PT Long Term Goals - 08/12/20 1819      PT LONG TERM GOAL #1   Title Independent with advanced HEP    Time 6    Period Weeks    Status New    Target Date 10/09/20      PT LONG TERM GOAL #2   Title bilateral hip muscle strength at least 4+/5 for all motions with </= 2/10 pain    Time 6    Period Weeks    Status New    Target Date 10/09/20      PT LONG TERM GOAL #3   Title Pt will report ability to stand at least 45 minutes to cook a meal in kitchen with </= 2/10 pain    Time 6    Period Weeks    Status New    Target Date 10/09/20      PT LONG TERM GOAL #4   Title Pt will be able to walk long distances in commnity with </= 2/10 low back and LLE pain    Time 6    Period Weeks    Status New    Target Date 10/09/20                  Plan - 08/12/20 1445    Clinical Impression Statement Pt is  70 yo female with a chistory of chronic LBP wth left sided sciatica. She currently demonstrated back and LE muscle stiffness, decreased muscle strength (LLE > RLE, core), decreased endurance, as well as positive SLR on the LLE. As a result this pain and discomfort has diminished her standing and walking tolerance. She will benefit from  skilled physical therapy to work on decreasing pain, improving strength and functional mobility.    Personal Factors and Comorbidities Comorbidity 3+    Stability/Clinical Decision Making Evolving/Moderate complexity    Clinical Decision Making Moderate    Rehab Potential Good    PT Frequency 2x / week    PT Duration 6 weeks    PT Treatment/Interventions ADLs/Self Care Home Management;Cryotherapy;Electrical Stimulation;Iontophoresis 4mg /ml Dexamethasone;Moist Heat;Neuromuscular re-education;Therapeutic exercise;Therapeutic activities;Patient/family education;Taping;Manual techniques;Energy conservation    PT Next Visit Plan Will not be in the area from 4/21 to 5/1 and is getting parathyroid surgery 09/10/20 - plan to get a few treatment sessions in before this and resume once cleared post op. Reassess HEP and progress LE strength, spine/LE flexbility, core strength and stability as tolerated. Manual and modalities as needed.    PT Home Exercise Plan see pt edu    Consulted and Agree with Plan of  Care Patient           Patient will benefit from skilled therapeutic intervention in order to improve the following deficits and impairments:  Difficulty walking,Increased muscle spasms,Decreased endurance,Pain,Decreased balance,Impaired flexibility,Hypomobility,Decreased strength,Decreased mobility  Visit Diagnosis: Muscle weakness (generalized) - Plan: PT plan of care cert/re-cert  Chronic low back pain with left-sided sciatica, unspecified back pain laterality - Plan: PT plan of care cert/re-cert  Abnormal posture - Plan: PT plan of care cert/re-cert  Cramp and spasm - Plan: PT plan of care cert/re-cert  Right knee pain, unspecified chronicity     Problem List Patient Active Problem List   Diagnosis Date Noted  . Vitamin D insufficiency 08/30/2019  . Primary hyperparathyroidism ([place]) 08/30/2019  . Palpitations 04/19/2018  . Dyslipidemia 04/19/2018  . Cholecystitis 07/14/2017  . Acute  cholecystitis 07/14/2017  . Obesity (BMI 30-39.9) 04/21/2017  . Radial scar of breast 03/18/2016  . Gastroesophageal reflux disease 09/04/2015  . Other seasonal allergic rhinitis 09/04/2015  . Hiatal hernia with gastroesophageal reflux 06/24/2014  . Class 2 obesity due to excess calories with serious comorbidity and body mass index (BMI) of 37.0 to 37.9 in adult 06/24/2014  . Colon polyp 12/17/2013  . Routine general medical examination at a health care facility 12/17/2013  . Pain in the chest 12/17/2013  . Osteopenia 07/16/2013  . Essential hypertension, benign 07/16/2013  . Controlled type 2 diabetes mellitus with chronic kidney disease, without long-term current use of insulin ([place]) 04/09/2013  . Unspecified vitamin D deficiency 04/09/2013  . Depression 04/09/2013    Hall Busing, PT, DPT 08/13/2020, 7:51 AM  [place]. [place], [place], [address] Phone: [phone]   Fax:  [phone]  Name: Jill Garza MRN: [id number] Date of Birth: [birth year]

## 2020-08-25 ENCOUNTER — Other Ambulatory Visit: Payer: Self-pay | Admitting: Nurse Practitioner

## 2020-08-25 ENCOUNTER — Encounter: Payer: Self-pay | Admitting: Physical Therapy

## 2020-08-25 ENCOUNTER — Other Ambulatory Visit: Payer: Self-pay

## 2020-08-25 ENCOUNTER — Ambulatory Visit: Payer: Medicare Other | Admitting: Physical Therapy

## 2020-08-25 DIAGNOSIS — M6281 Muscle weakness (generalized): Secondary | ICD-10-CM

## 2020-08-25 DIAGNOSIS — G8929 Other chronic pain: Secondary | ICD-10-CM | POA: Diagnosis not present

## 2020-08-25 DIAGNOSIS — R252 Cramp and spasm: Secondary | ICD-10-CM

## 2020-08-25 DIAGNOSIS — E785 Hyperlipidemia, unspecified: Secondary | ICD-10-CM

## 2020-08-25 DIAGNOSIS — M25561 Pain in right knee: Secondary | ICD-10-CM

## 2020-08-25 DIAGNOSIS — M5442 Lumbago with sciatica, left side: Secondary | ICD-10-CM

## 2020-08-25 DIAGNOSIS — R293 Abnormal posture: Secondary | ICD-10-CM

## 2020-08-25 NOTE — Therapy (Signed)
[place]. [place], [place], [address] Phone: [phone]   Fax:  [phone]  Physical Therapy Treatment  Patient Details  Name: Jill Garza MRN: [id number] Date of Birth: [birth year] Referring Provider (PT): Lauree Chandler, NP   Encounter Date: 08/25/2020   PT End of Session - 08/25/20 1510    Visit Number 2    Number of Visits 13    Date for PT Re-Evaluation 10/09/20    Authorization Type Medicare & AARP    PT Start Time 1430    PT Stop Time 1511    PT Time Calculation (min) 41 min    Activity Tolerance Patient tolerated treatment well    Behavior During Therapy [facility] for tasks assessed/performed           Past Medical History:  Diagnosis Date  . Arthritis   . Cancer ([place])    melanoma- right leg  . Depression   . GERD (gastroesophageal reflux disease)   . History of hiatal hernia   . Hypercholesterolemia   . Hyperparathyroidism ([place])   . Hypertension   . Sweat, sweating, excessive   . Type 2 diabetes mellitus ([place])     Past Surgical History:  Procedure Laterality Date  . ABDOMINAL HYSTERECTOMY  1979  . APPENDECTOMY  1964  . BREAST EXCISIONAL BIOPSY Left    benign  . BREAST LUMPECTOMY WITH RADIOACTIVE SEED LOCALIZATION Left 12/31/2014   Procedure: LEFT BREAST LUMPECTOMY WITH RADIOACTIVE SEED LOCALIZATION;  Surgeon: Donnie Mesa, MD;  Location: [facility];  Service: General;  Laterality: Left;  . CARPAL TUNNEL RELEASE Right 1977  . CARPAL TUNNEL RELEASE Left 2007  . CATARACT EXTRACTION Left 04/13/2014   Dr. Herbert Deaner  . CATARACT EXTRACTION Right 06/10/2014   Dr. Herbert Deaner  . CHOLECYSTECTOMY N/A 07/14/2017   Procedure: LAPAROSCOPIC CHOLECYSTECTOMY;  Surgeon: Ileana Roup, MD;  Location: [place];  Service: General;  Laterality: N/A;  . COLONOSCOPY  2009  . JOINT REPLACEMENT    . MELANOMA EXCISION    . TONSILLECTOMY  1962  . TOTAL KNEE ARTHROPLASTY Left 2012  . TOTAL KNEE  ARTHROPLASTY Right 2008    There were no vitals filed for this visit.   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 Melanoma which was excised.    Currently in Pain? Yes    Pain Score 2     Pain Location Back                             [facility] Adult PT Treatment/Exercise - 08/25/20 0001      Exercises   Exercises Lumbar      Lumbar Exercises: Stretches   Passive Hamstring Stretch Right;Left;4 reps;10 seconds    Single Knee to Chest Stretch Left;Right;3 reps;10 seconds    Lower Trunk Rotation 4 reps;10 seconds      Lumbar Exercises: Aerobic   Nustep L4 x 6 mn      Lumbar Exercises: Machines for Strengthening   Cybex Knee Extension 5lb 2x12    Cybex Knee Flexion 20lb 2x12    Other Lumbar Machine Exercise Rows & Lats 25lb 2x10      Lumbar Exercises: Standing   Shoulder Extension Strengthening;Both;20 reps;Theraband    Theraband Level (Shoulder Extension) Level 3 (Green)  Lumbar Exercises: Seated   Sit to Stand 10 reps   x2 OHP w/ 3lb dumbbell                   PT Short Term Goals - 08/12/20 1819      PT SHORT TERM GOAL #1   Title Independent with initial HEP    Time 3    Period Weeks    Status New    Target Date 09/02/20             PT Long Term Goals - 08/12/20 1819      PT LONG TERM GOAL #1   Title Independent with advanced HEP    Time 6    Period Weeks    Status New    Target Date 10/09/20      PT LONG TERM GOAL #2   Title bilateral hip muscle strength at least 4+/5 for all motions with </= 2/10 pain    Time 6    Period Weeks    Status New    Target Date 10/09/20      PT LONG TERM GOAL #3   Title Pt will report ability to stand at least 45 minutes to cook a meal in kitchen with </= 2/10 pain    Time 6    Period Weeks    Status New     Target Date 10/09/20      PT LONG TERM GOAL #4   Title Pt will be able to walk long distances in commnity with </= 2/10 low back and LLE pain    Time 6    Period Weeks    Status New    Target Date 10/09/20                 Plan - 08/25/20 1510    Clinical Impression Statement Pt reports that she has LBP with pro long walking. She tolerated an initial progression to TE well. Postural que's to prevent swaying with seated rows. Cues for core engagement needed with standing shoulder extensions. Bilateral LE tightness noted with passive stretching. Cues to fully extend LE with extensions.    Personal Factors and Comorbidities Comorbidity 3+    Stability/Clinical Decision Making Evolving/Moderate complexity    PT Frequency 2x / week    PT Treatment/Interventions ADLs/Self Care Home Management;Cryotherapy;Electrical Stimulation;Iontophoresis 4mg /ml Dexamethasone;Moist Heat;Neuromuscular re-education;Therapeutic exercise;Therapeutic activities;Patient/family education;Taping;Manual techniques;Energy conservation;Cognitive remediation    PT Next Visit Plan Will not be in the area from 4/21 to 5/1 and is getting parathyroid surgery 09/10/20 - plan to get a few treatment sessions in before this and resume once cleared post op.progress LE strength, spine/LE flexbility, core strength and stability as tolerated. Manual and modalities as needed.           Patient will benefit from skilled therapeutic intervention in order to improve the following deficits and impairments:  Difficulty walking,Increased muscle spasms,Decreased endurance,Pain,Decreased balance,Impaired flexibility,Hypomobility,Decreased strength,Decreased mobility  Visit Diagnosis: Chronic low back pain with left-sided sciatica, unspecified back pain laterality  Abnormal posture  Muscle weakness (generalized)  Cramp and spasm  Right knee pain, unspecified chronicity     Problem List Patient Active Problem List   Diagnosis  Date Noted  . Vitamin D insufficiency 08/30/2019  . Primary hyperparathyroidism ([place]) 08/30/2019  . Palpitations 04/19/2018  . Dyslipidemia 04/19/2018  . Cholecystitis 07/14/2017  . Acute cholecystitis 07/14/2017  . Obesity (BMI 30-39.9) 04/21/2017  . Radial scar of breast 03/18/2016  . Gastroesophageal reflux disease 09/04/2015  .  Other seasonal allergic rhinitis 09/04/2015  . Hiatal hernia with gastroesophageal reflux 06/24/2014  . Class 2 obesity due to excess calories with serious comorbidity and body mass index (BMI) of 37.0 to 37.9 in adult 06/24/2014  . Colon polyp 12/17/2013  . Routine general medical examination at a health care facility 12/17/2013  . Pain in the chest 12/17/2013  . Osteopenia 07/16/2013  . Essential hypertension, benign 07/16/2013  . Controlled type 2 diabetes mellitus with chronic kidney disease, without long-term current use of insulin ([place]) 04/09/2013  . Unspecified vitamin D deficiency 04/09/2013  . Depression 04/09/2013    Scot Jun 08/25/2020, 3:14 PM  [place]. [place], [place], [address] Phone: [phone]   Fax:  [phone]  Name: Jill Garza MRN: [id number] Date of Birth: 10/19/1950

## 2020-08-25 NOTE — Telephone Encounter (Signed)
Patient has request refill on medications Paroxetine 30mg , and Rosuvastatin 5mg . Medication Paroxetine was last refilled 05/29/2020 with 90 tablets to be taken once daily. Rosuvastatin was last refilled 05/29/2020 with 30 tablets to be taken three times a week. Unsure if medication is long term. Medication pend and sent to PCP Dewaine Oats Carlos American, NP for approval.

## 2020-08-26 DIAGNOSIS — Z20822 Contact with and (suspected) exposure to covid-19: Secondary | ICD-10-CM | POA: Diagnosis not present

## 2020-09-03 NOTE — Progress Notes (Signed)
DUE TO COVID-19 ONLY ONE VISITOR IS ALLOWED TO COME WITH YOU AND STAY IN THE WAITING ROOM ONLY DURING PRE OP AND PROCEDURE DAY OF SURGERY. THE 1 VISITOR  MAY VISIT WITH YOU AFTER SURGERY IN YOUR PRIVATE ROOM DURING VISITING HOURS ONLY!  YOU NEED TO HAVE A COVID 19 TEST ON____5/2/22 DONE BEFORE SURGERY,  COVID TESTING SITE [address] [place] [address], IT IS ON THE RIGHT GOING OUT [address] APPROXIMATELY  2 MINUTES PAST [employer] ON THE RIGHT. ONCE YOUR COVID TEST IS COMPLETED,  PLEASE BEGIN THE QUARANTINE INSTRUCTIONS AS OUTLINED IN YOUR HANDOUT.                Jill Garza [facility]  09/03/2020   Your procedure is scheduled on:  09/10/20  Report to [facility] Main  Entrance   Report to admitting at    [place] AM     Call this number if you have problems the morning of surgery [phone]    REMEMBER: NO  SOLID FOOD CANDY OR GUM AFTER MIDNIGHT. CLEAR LIQUIDS UNTIL  0530am      . NOTHING BY MOUTH EXCEPT CLEAR LIQUIDS UNTIL      0530am  . PLEASE FINISH ENSURE DRINK PER SURGEON ORDER  WHICH NEEDS TO BE COMPLETED AT  0530am    .      CLEAR LIQUID DIET   Foods Allowed                                                                    Coffee and tea, regular and decaf                            Fruit ices (not with fruit pulp)                                      Iced Popsicles                                    Carbonated beverages, regular and diet                                    Cranberry, grape and apple juices Sports drinks like Gatorade Lightly seasoned clear broth or consume(fat free) Sugar, honey syrup ___________________________________________________________________      BRUSH YOUR TEETH MORNING OF SURGERY AND RINSE YOUR MOUTH OUT, NO CHEWING GUM CANDY OR MINTS.     Take these medicines the morning of surgery with A SIP OF WATER:  Prilosec, paxil, amlodpine  DO NOT TAKE ANY DIABETIC MEDICATIONS DAY OF YOUR SURGERY                                You may not have any metal on your body including hair pins and              piercings  Do not wear jewelry, make-up, lotions,  powders or perfumes, deodorant             Do not wear nail polish on your fingernails.  Do not shave  48 hours prior to surgery.              Men may shave face and neck.   Do not bring valuables to the hospital. [place].  Contacts, dentures or bridgework may not be worn into surgery.  Leave suitcase in the car. After surgery it may be brought to your room.     Patients discharged the day of surgery will not be allowed to drive home. IF YOU ARE HAVING SURGERY AND GOING HOME THE SAME DAY, YOU MUST HAVE AN ADULT TO DRIVE YOU HOME AND BE WITH YOU FOR 24 HOURS. YOU MAY GO HOME BY TAXI OR UBER OR ORTHERWISE, BUT AN ADULT MUST ACCOMPANY YOU HOME AND STAY WITH YOU FOR 24 HOURS.  Name and phone number of your driver:  Special Instructions: N/A              Please read over the following fact sheets you were given: _____________________________________________________________________  [facility] - Preparing for Surgery Before surgery, you can play an important role.  Because skin is not sterile, your skin needs to be as free of germs as possible.  You can reduce the number of germs on your skin by washing with CHG (chlorahexidine gluconate) soap before surgery.  CHG is an antiseptic cleaner which kills germs and bonds with the skin to continue killing germs even after washing. Please DO NOT use if you have an allergy to CHG or antibacterial soaps.  If your skin becomes reddened/irritated stop using the CHG and inform your nurse when you arrive at Short Stay. Do not shave (including legs and underarms) for at least 48 hours prior to the first CHG shower.  You may shave your face/neck. Please follow these instructions carefully:  1.  Shower with CHG Soap the night before surgery and the  morning of Surgery.  2.   If you choose to wash your hair, wash your hair first as usual with your  normal  shampoo.  3.  After you shampoo, rinse your hair and body thoroughly to remove the  shampoo.                           4.  Use CHG as you would any other liquid soap.  You can apply chg directly  to the skin and wash                       Gently with a scrungie or clean washcloth.  5.  Apply the CHG Soap to your body ONLY FROM THE NECK DOWN.   Do not use on face/ open                           Wound or open sores. Avoid contact with eyes, ears mouth and genitals (private parts).                       Wash face,  Genitals (private parts) with your normal soap.             6.  Wash thoroughly, paying special attention to the  area where your surgery  will be performed.  7.  Thoroughly rinse your body with warm water from the neck down.  8.  DO NOT shower/wash with your normal soap after using and rinsing off  the CHG Soap.                9.  Pat yourself dry with a clean towel.            10.  Wear clean pajamas.            11.  Place clean sheets on your bed the night of your first shower and do not  sleep with pets. Day of Surgery : Do not apply any lotions/deodorants the morning of surgery.  Please wear clean clothes to the hospital/surgery center.  FAILURE TO FOLLOW THESE INSTRUCTIONS MAY RESULT IN THE CANCELLATION OF YOUR SURGERY PATIENT SIGNATURE_________________________________  NURSE SIGNATURE__________________________________  ________________________________________________________________________

## 2020-09-05 ENCOUNTER — Encounter (HOSPITAL_COMMUNITY): Payer: Self-pay | Admitting: Surgery

## 2020-09-05 NOTE — H&P (Signed)
General Surgery [facility]  Jill Garza DOB: 1950-05-23 Married / Language: English / Race: White Female   History of Present Illness  The patient is a 70 year old female who presents with primary hyperparathyroidism.  CHIEF COMPLAINT: primary hyperparathyroidism  Patient is referred by Dr. Vivia Ewing for surgical evaluation and management of suspected primary hyperparathyroidism. Patient had been diagnosed as early as 2016 with hypercalcemia. Recently she had undergone further evaluation. Calcium levels in the past 6 months of range from 10.4-10.8. Intact PTH levels ranged from 45-83. A 24-hour urine collection for calcium has ranged from 397-464. Patient had been diagnosed with vitamin D deficiency and with supplementation is now normal with a recent 25-hydroxy vitamin D level is 36.8. Bone density scanning had shown evidence of osteopenia. Patient complains of fatigue. She denies any fractures. She denies any history of nephrolithiasis. Patient is had no prior head or neck surgery. There is no family history of parathyroid disease. Patient has not had any imaging studies performed. Patient is a retired Runner, broadcasting/film/video.   Medication History  Losartan Potassium-HCTZ (Oral) Specific strength unknown - Active. Calcium Carbonate (600MG  Tablet, Oral daily) Active. Aspirin EC (81MG  Tablet DR, Oral) Active. Hyzaar (100-12.5MG  Tablet, Oral) Active. Glucophage (500MG  Tablet, Oral) Active. PriLOSEC (20MG  Capsule DR, Oral) Active. Paxil (30MG  Tablet, Oral) Active. Zocor (10MG  Tablet, Oral) Active. Medications Reconciled    Physical Exam   GENERAL APPEARANCE Development: normal Nutritional status: normal Gross deformities: none  SKIN Rash, lesions, ulcers: none Induration, erythema: none Nodules: none palpable  EYES Conjunctiva and lids: normal Pupils: equal and reactive Iris: normal bilaterally  EARS, NOSE, MOUTH,  THROAT External ears: no lesion or deformity External nose: no lesion or deformity Hearing: grossly normal Due to Covid-19 pandemic, patient is wearing a mask.  NECK Symmetric: yes Trachea: midline Thyroid: no palpable nodules in the thyroid bed  CHEST Respiratory effort: normal Retraction or accessory muscle use: no Breath sounds: normal bilaterally Rales, rhonchi, wheeze: none  CARDIOVASCULAR Auscultation: regular rhythm, normal rate Murmurs: none Pulses: radial pulse 2+ palpable Lower extremity edema: none  MUSCULOSKELETAL Station and gait: normal Digits and nails: no clubbing or cyanosis Muscle strength: grossly normal all extremities Range of motion: grossly normal all extremities Deformity: none  LYMPHATIC Cervical: none palpable Supraclavicular: none palpable  PSYCHIATRIC Oriented to person, place, and time: yes Mood and affect: normal for situation Judgment and insight: appropriate for situation    Assessment & Plan   PRIMARY HYPERPARATHYROIDISM (E21.0) OSTEOPENIA OF NECK OF LEFT FEMUR (M85.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  Patient provided with a copy of "Parathyroid Surgery: Treatment for Your Parathyroid Gland Problem", published by [employer], 12 pages. Book reviewed and explained to patient during visit today.  Patient has biochemical evidence of primary hyperparathyroidism. She does have complications including osteopenia and fatigue. I have recommended proceeding with imaging studies to include a nuclear medicine parathyroid scan and an ultrasound examination of the neck. Together these studies are able to confirm the diagnosis and localize a parathyroid adenoma approximately 80% at the time. If they failed to localize the adenoma, then we will consider a 4D CT  scan of the neck with parathyroid protocol. Our hope is to localize the parathyroid adenoma and make the patient a candidate for minimally invasive outpatient surgery. We discussed parathyroid surgery today briefly and  I provided her with written literature regarding parathyroid surgery to review at home.  The patient was undergoing the above testing. We will contact her with the results when they are available.   ADDENDUM  Telephone call to patient to review the results of her imaging studies. We discussed her ultrasound examination which failed to identify any evidence of parathyroid adenoma. Her nuclear medicine parathyroid scan suggested a right inferior parathyroid adenoma but was not felt to be definitive. A 4D CT scan was ordered in hopes of confirming the location of the adenoma, but the CT scan did not demonstrate any evidence of parathyroid adenoma.  Patient does have biochemical evidence of hyperparathyroidism. The nuclear scan suggested a right inferior gland. I am suggesting to the patient that we move forward with surgery. I would plan a minimally invasive approach to the right inferior parathyroid gland. If that is found at the time of surgery and confirmed with frozen section biopsy, then we will only perform the minimally invasive procedure as an outpatient surgery. If there is not an adenoma at the right inferior position, then we will go ahead at the time of surgery and do a 4 gland exploration and keep the patient overnight for observation. Patient understands the strategy and agrees to proceed.  The risks and benefits of the procedure have been discussed at length with the patient. The patient understands the proposed procedure, potential alternative treatments, and the course of recovery to be expected. All of the patient's questions have been answered at this time. The patient wishes to proceed with surgery.  Armandina Gemma, [facility] Office:  [phone]

## 2020-09-07 ENCOUNTER — Other Ambulatory Visit: Payer: Self-pay

## 2020-09-07 ENCOUNTER — Encounter (HOSPITAL_COMMUNITY): Payer: Self-pay

## 2020-09-07 ENCOUNTER — Other Ambulatory Visit (HOSPITAL_COMMUNITY)
Admission: RE | Admit: 2020-09-07 | Discharge: 2020-09-07 | Disposition: A | Discharge status: Home / Self Care | Payer: Medicare Other | Source: Ambulatory Visit | Attending: Surgery | Admitting: Surgery

## 2020-09-07 ENCOUNTER — Encounter (HOSPITAL_COMMUNITY)
Admission: RE | Admit: 2020-09-07 | Discharge: 2020-09-07 | Disposition: A | Discharge status: Home / Self Care | Payer: Medicare Other | Source: Ambulatory Visit | Attending: Surgery | Admitting: Surgery

## 2020-09-07 DIAGNOSIS — Z01818 Encounter for other preprocedural examination: Secondary | ICD-10-CM | POA: Insufficient documentation

## 2020-09-07 DIAGNOSIS — Z20822 Contact with and (suspected) exposure to covid-19: Secondary | ICD-10-CM | POA: Diagnosis not present

## 2020-09-07 HISTORY — DX: Chronic kidney disease, unspecified: N18.9

## 2020-09-07 LAB — CBC
HCT: 41.3 % (ref 36.0–46.0)
Hemoglobin: 12.9 g/dL (ref 12.0–15.0)
MCH: 28.1 pg (ref 26.0–34.0)
MCHC: 31.2 g/dL (ref 30.0–36.0)
MCV: 90 fL (ref 80.0–100.0)
Platelets: 276 10*3/uL (ref 150–400)
RBC: 4.59 MIL/uL (ref 3.87–5.11)
RDW: 12.7 % (ref 11.5–15.5)
WBC: 5.6 10*3/uL (ref 4.0–10.5)
nRBC: 0 % (ref 0.0–0.2)

## 2020-09-07 LAB — BASIC METABOLIC PANEL
Anion gap: 8 (ref 5–15)
BUN: 24 mg/dL — ABNORMAL HIGH (ref 8–23)
CO2: 24 mmol/L (ref 22–32)
Calcium: 10.6 mg/dL — ABNORMAL HIGH (ref 8.9–10.3)
Chloride: 106 mmol/L (ref 98–111)
Creatinine, Ser: 1.22 mg/dL — ABNORMAL HIGH (ref 0.44–1.00)
GFR, Estimated: 47 mL/min — ABNORMAL LOW (ref >60)
Glucose, Bld: 98 mg/dL (ref 70–99)
Potassium: 4.4 mmol/L (ref 3.5–5.1)
Sodium: 138 mmol/L (ref 135–145)

## 2020-09-07 LAB — GLUCOSE, CAPILLARY: Glucose-Capillary: 106 mg/dL — ABNORMAL HIGH (ref 70–99)

## 2020-09-07 NOTE — Progress Notes (Signed)
Anesthesia Review:  PCP: Dani Gobble LOv 07/24/20 Cardiologist : DR Percival Spanish - Was seen by in 2019 for flutter in in chest not followed by cardiologist since per pt  LOV 04/19/2018  Chest x-ray : EKG : 09/07/20  Echo : Stress test: 2019 Cardiac Cath :  Activity level: can do a flight of stairs without difficulty  Sleep Study/ CPAP : none  Fasting Blood Sugar :      / Checks Blood Sugar -- times a day:   Blood Thinner/ Instructions /Last Dose: ASA / Instructions/ Last Dose :  DM- type 2- does not check glucose at home  HGBA!c- 6.7- 07/24/20

## 2020-09-08 LAB — SARS CORONAVIRUS 2 (TAT 6-24 HRS): SARS Coronavirus 2: NEGATIVE

## 2020-09-10 ENCOUNTER — Other Ambulatory Visit: Payer: Self-pay

## 2020-09-10 ENCOUNTER — Ambulatory Visit (HOSPITAL_COMMUNITY): Payer: Medicare Other | Admitting: Certified Registered Nurse Anesthetist

## 2020-09-10 ENCOUNTER — Ambulatory Visit (HOSPITAL_COMMUNITY)
Admission: RE | Admit: 2020-09-10 | Discharge: 2020-09-10 | Disposition: A | Discharge status: Home / Self Care | Payer: Medicare Other | Attending: Surgery | Admitting: Surgery

## 2020-09-10 ENCOUNTER — Encounter (HOSPITAL_COMMUNITY)
Admission: RE | Disposition: A | Discharge status: Home / Self Care | Payer: Self-pay | Source: Home / Self Care | Attending: Surgery

## 2020-09-10 ENCOUNTER — Ambulatory Visit (HOSPITAL_COMMUNITY): Payer: Medicare Other | Admitting: Physician Assistant

## 2020-09-10 ENCOUNTER — Encounter (HOSPITAL_COMMUNITY): Payer: Self-pay | Admitting: Surgery

## 2020-09-10 DIAGNOSIS — Z7982 Long term (current) use of aspirin: Secondary | ICD-10-CM | POA: Insufficient documentation

## 2020-09-10 DIAGNOSIS — Z6838 Body mass index (BMI) 38.0-38.9, adult: Secondary | ICD-10-CM | POA: Diagnosis not present

## 2020-09-10 DIAGNOSIS — Z87891 Personal history of nicotine dependence: Secondary | ICD-10-CM | POA: Diagnosis not present

## 2020-09-10 DIAGNOSIS — E7800 Pure hypercholesterolemia, unspecified: Secondary | ICD-10-CM | POA: Diagnosis not present

## 2020-09-10 DIAGNOSIS — E669 Obesity, unspecified: Secondary | ICD-10-CM | POA: Insufficient documentation

## 2020-09-10 DIAGNOSIS — E210 Primary hyperparathyroidism: Secondary | ICD-10-CM | POA: Insufficient documentation

## 2020-09-10 DIAGNOSIS — Z7984 Long term (current) use of oral hypoglycemic drugs: Secondary | ICD-10-CM | POA: Insufficient documentation

## 2020-09-10 DIAGNOSIS — Z79899 Other long term (current) drug therapy: Secondary | ICD-10-CM | POA: Insufficient documentation

## 2020-09-10 DIAGNOSIS — D351 Benign neoplasm of parathyroid gland: Secondary | ICD-10-CM | POA: Diagnosis not present

## 2020-09-10 DIAGNOSIS — E213 Hyperparathyroidism, unspecified: Secondary | ICD-10-CM | POA: Diagnosis present

## 2020-09-10 DIAGNOSIS — E559 Vitamin D deficiency, unspecified: Secondary | ICD-10-CM | POA: Diagnosis not present

## 2020-09-10 DIAGNOSIS — K810 Acute cholecystitis: Secondary | ICD-10-CM | POA: Diagnosis not present

## 2020-09-10 HISTORY — PX: PARATHYROIDECTOMY: SHX19

## 2020-09-10 HISTORY — PX: THYROID EXPLORATION: SHX5280

## 2020-09-10 LAB — GLUCOSE, CAPILLARY
Glucose-Capillary: 151 mg/dL — ABNORMAL HIGH (ref 70–99)
Glucose-Capillary: 157 mg/dL — ABNORMAL HIGH (ref 70–99)

## 2020-09-10 SURGERY — PARATHYROIDECTOMY
Anesthesia: General

## 2020-09-10 MED ORDER — MIDAZOLAM HCL 2 MG/2ML IJ SOLN
INTRAMUSCULAR | Status: AC
  Filled 2020-09-10: qty 2

## 2020-09-10 MED ORDER — HYDROMORPHONE HCL 1 MG/ML IJ SOLN: 0.2500 mg | INTRAMUSCULAR | Status: DC | PRN

## 2020-09-10 MED ORDER — LIDOCAINE 2% (20 MG/ML) 5 ML SYRINGE
INTRAMUSCULAR | Status: AC
  Filled 2020-09-10: qty 5

## 2020-09-10 MED ORDER — ROCURONIUM BROMIDE 10 MG/ML (PF) SYRINGE
PREFILLED_SYRINGE | INTRAVENOUS | Status: AC
  Filled 2020-09-10: qty 10

## 2020-09-10 MED ORDER — OXYCODONE HCL 5 MG PO TABS
ORAL_TABLET | ORAL | Status: AC
  Filled 2020-09-10: qty 1

## 2020-09-10 MED ORDER — ROCURONIUM BROMIDE 10 MG/ML (PF) SYRINGE
PREFILLED_SYRINGE | INTRAVENOUS | Status: DC | PRN
  Administered 2020-09-10: 70 mg via INTRAVENOUS

## 2020-09-10 MED ORDER — LIDOCAINE 2% (20 MG/ML) 5 ML SYRINGE
INTRAMUSCULAR | Status: DC | PRN
  Administered 2020-09-10: 60 mg via INTRAVENOUS

## 2020-09-10 MED ORDER — PROPOFOL 10 MG/ML IV BOLUS
INTRAVENOUS | Status: DC | PRN
  Administered 2020-09-10: 130 mg via INTRAVENOUS

## 2020-09-10 MED ORDER — ONDANSETRON HCL 4 MG/2ML IJ SOLN: 4.0000 mg | Freq: Once | INTRAMUSCULAR | Status: DC | PRN

## 2020-09-10 MED ORDER — CIPROFLOXACIN IN D5W 400 MG/200ML IV SOLN
400.0000 mg | INTRAVENOUS | Status: AC
  Administered 2020-09-10: 400 mg via INTRAVENOUS
  Filled 2020-09-10: qty 200

## 2020-09-10 MED ORDER — ORAL CARE MOUTH RINSE: 15.0000 mL | Freq: Once | OROMUCOSAL | Status: AC

## 2020-09-10 MED ORDER — DEXAMETHASONE SODIUM PHOSPHATE 4 MG/ML IJ SOLN
INTRAMUSCULAR | Status: DC | PRN
  Administered 2020-09-10: 5 mg via INTRAVENOUS

## 2020-09-10 MED ORDER — PHENYLEPHRINE 40 MCG/ML (10ML) SYRINGE FOR IV PUSH (FOR BLOOD PRESSURE SUPPORT)
PREFILLED_SYRINGE | INTRAVENOUS | Status: DC | PRN
  Administered 2020-09-10 (×3): 80 ug via INTRAVENOUS
  Administered 2020-09-10: 120 ug via INTRAVENOUS

## 2020-09-10 MED ORDER — FENTANYL CITRATE (PF) 100 MCG/2ML IJ SOLN
INTRAMUSCULAR | Status: AC
  Filled 2020-09-10: qty 2

## 2020-09-10 MED ORDER — MIDAZOLAM HCL 5 MG/5ML IJ SOLN
INTRAMUSCULAR | Status: DC | PRN
  Administered 2020-09-10: 2 mg via INTRAVENOUS

## 2020-09-10 MED ORDER — CHLORHEXIDINE GLUCONATE CLOTH 2 % EX PADS: 6.0000 | MEDICATED_PAD | Freq: Once | CUTANEOUS | Status: DC

## 2020-09-10 MED ORDER — LACTATED RINGERS IV SOLN: INTRAVENOUS | Status: DC

## 2020-09-10 MED ORDER — BUPIVACAINE HCL (PF) 0.25 % IJ SOLN
INTRAMUSCULAR | Status: DC | PRN
  Administered 2020-09-10: 8 mL

## 2020-09-10 MED ORDER — SODIUM CHLORIDE 0.9 % IR SOLN
Status: DC | PRN
  Administered 2020-09-10: 1000 mL

## 2020-09-10 MED ORDER — PROPOFOL 10 MG/ML IV BOLUS
INTRAVENOUS | Status: AC
  Filled 2020-09-10: qty 20

## 2020-09-10 MED ORDER — ONDANSETRON HCL 4 MG/2ML IJ SOLN
INTRAMUSCULAR | Status: AC
  Filled 2020-09-10: qty 2

## 2020-09-10 MED ORDER — ONDANSETRON HCL 4 MG/2ML IJ SOLN
INTRAMUSCULAR | Status: DC | PRN
  Administered 2020-09-10: 4 mg via INTRAVENOUS

## 2020-09-10 MED ORDER — FENTANYL CITRATE (PF) 100 MCG/2ML IJ SOLN
INTRAMUSCULAR | Status: DC | PRN
  Administered 2020-09-10 (×3): 50 ug via INTRAVENOUS

## 2020-09-10 MED ORDER — OXYCODONE HCL 5 MG/5ML PO SOLN: 5.0000 mg | Freq: Once | ORAL | Status: AC | PRN

## 2020-09-10 MED ORDER — SUGAMMADEX SODIUM 200 MG/2ML IV SOLN
INTRAVENOUS | Status: DC | PRN
  Administered 2020-09-10: 200 mg via INTRAVENOUS

## 2020-09-10 MED ORDER — ACETAMINOPHEN 500 MG PO TABS
1000.0000 mg | ORAL_TABLET | Freq: Once | ORAL | Status: AC
  Administered 2020-09-10: 1000 mg via ORAL
  Filled 2020-09-10: qty 2

## 2020-09-10 MED ORDER — CHLORHEXIDINE GLUCONATE 0.12 % MT SOLN
15.0000 mL | Freq: Once | OROMUCOSAL | Status: AC
  Administered 2020-09-10: 15 mL via OROMUCOSAL

## 2020-09-10 MED ORDER — DEXAMETHASONE SODIUM PHOSPHATE 10 MG/ML IJ SOLN
INTRAMUSCULAR | Status: AC
  Filled 2020-09-10: qty 1

## 2020-09-10 MED ORDER — TRAMADOL HCL 50 MG PO TABS: 50.0000 mg | ORAL_TABLET | Freq: Four times a day (QID) | ORAL | 0 refills | Status: DC | PRN

## 2020-09-10 MED ORDER — BUPIVACAINE HCL (PF) 0.25 % IJ SOLN
INTRAMUSCULAR | Status: AC
  Filled 2020-09-10: qty 30

## 2020-09-10 MED ORDER — OXYCODONE HCL 5 MG PO TABS
5.0000 mg | ORAL_TABLET | Freq: Once | ORAL | Status: AC | PRN
  Administered 2020-09-10: 5 mg via ORAL

## 2020-09-10 SURGICAL SUPPLY — 28 items
ATTRACTOMAT 16X20 MAGNETIC DRP (DRAPES) ×2 IMPLANT
CHLORAPREP W/TINT 26 (MISCELLANEOUS) ×2 IMPLANT
CLIP VESOCCLUDE MED 6/CT (CLIP) ×4 IMPLANT
CLIP VESOCCLUDE SM WIDE 6/CT (CLIP) ×4 IMPLANT
COVER SURGICAL LIGHT HANDLE (MISCELLANEOUS) ×2 IMPLANT
COVER WAND RF STERILE (DRAPES) ×2 IMPLANT
DERMABOND ADVANCED (GAUZE/BANDAGES/DRESSINGS) ×1
DERMABOND ADVANCED .7 DNX12 (GAUZE/BANDAGES/DRESSINGS) ×1 IMPLANT
DRAPE LAPAROTOMY T 98X78 PEDS (DRAPES) ×2 IMPLANT
DRAPE UTILITY XL STRL (DRAPES) ×2 IMPLANT
ELECT REM PT RETURN 15FT ADLT (MISCELLANEOUS) ×2 IMPLANT
GAUZE 4X4 16PLY RFD (DISPOSABLE) ×2 IMPLANT
GLOVE SURG ORTHO LTX SZ8 (GLOVE) ×2 IMPLANT
GOWN STRL REUS W/TWL XL LVL3 (GOWN DISPOSABLE) ×6 IMPLANT
HEMOSTAT SURGICEL 2X4 FIBR (HEMOSTASIS) ×2 IMPLANT
ILLUMINATOR WAVEGUIDE N/F (MISCELLANEOUS) ×2 IMPLANT
KIT BASIN OR (CUSTOM PROCEDURE TRAY) ×2 IMPLANT
KIT TURNOVER KIT A (KITS) ×2 IMPLANT
NEEDLE HYPO 25X1 1.5 SAFETY (NEEDLE) ×2 IMPLANT
PACK BASIC VI WITH GOWN DISP (CUSTOM PROCEDURE TRAY) ×2 IMPLANT
PENCIL SMOKE EVACUATOR (MISCELLANEOUS) ×2 IMPLANT
SUT MNCRL AB 4-0 PS2 18 (SUTURE) ×2 IMPLANT
SUT VIC AB 3-0 SH 18 (SUTURE) ×2 IMPLANT
SYR BULB IRRIG 60ML STRL (SYRINGE) ×2 IMPLANT
SYR CONTROL 10ML LL (SYRINGE) ×2 IMPLANT
TOWEL OR 17X26 10 PK STRL BLUE (TOWEL DISPOSABLE) ×2 IMPLANT
TOWEL OR NON WOVEN STRL DISP B (DISPOSABLE) ×2 IMPLANT
TUBING CONNECTING 10 (TUBING) ×2 IMPLANT

## 2020-09-10 NOTE — Anesthesia Preprocedure Evaluation (Addendum)
Anesthesia Evaluation  Patient identified by MRN, date of birth, ID band Patient awake    Reviewed: Allergy & Precautions, NPO status , Patient's Chart, lab work & pertinent test results  Airway Mallampati: III  TM Distance: >3 FB Neck ROM: Full    Dental no notable dental hx. (+) Teeth Intact, Missing, Chipped, Dental Advisory Given,    Pulmonary former smoker,  Quit smoking 1988, 15 pack year history (1ppd) Snores at night, has never had sleep study    Pulmonary exam normal breath sounds clear to auscultation       Cardiovascular hypertension, Pt. on medications Normal cardiovascular exam Rhythm:Regular Rate:Normal  Stress test 2019:  There was no ST segment deviation noted during stress.  5 min 56 sec exercise.  PVC's noted during stress, quadrageminy pattern.  Overall low risk ETT with no electrocardiographic evidence of ischemia. PVC's noted during exercise.     Neuro/Psych PSYCHIATRIC DISORDERS Depression negative neurological ROS     GI/Hepatic Neg liver ROS, hiatal hernia, GERD  Medicated and Controlled,  Endo/Other  diabetes, Well Controlled, Type 2, Oral Hypoglycemic AgentsHyperPTH Obesity BMI 38 Last a1c 6.7  Renal/GU Renal InsufficiencyRenal diseaseCKD, Cr 1.22  negative genitourinary   Musculoskeletal  (+) Arthritis , Osteoarthritis,    Abdominal (+) + obese,   Peds negative pediatric ROS (+)  Hematology negative hematology ROS (+) hct 41.3, plt 276   Anesthesia Other Findings   Reproductive/Obstetrics negative OB ROS                           Anesthesia Physical Anesthesia Plan  ASA: II  Anesthesia Plan: General   Post-op Pain Management:    Induction: Intravenous  PONV Risk Score and Plan: 4 or greater and Ondansetron, Dexamethasone, Midazolam and Treatment may vary due to age or medical condition  Airway Management Planned: Oral ETT  Additional Equipment:  None  Intra-op Plan:   Post-operative Plan: Extubation in OR  Informed Consent: I have reviewed the patients History and Physical, chart, labs and discussed the procedure including the risks, benefits and alternatives for the proposed anesthesia with the patient or authorized representative who has indicated his/her understanding and acceptance.     Dental advisory given  Plan Discussed with: CRNA  Anesthesia Plan Comments:         Anesthesia Quick Evaluation

## 2020-09-10 NOTE — Interval H&P Note (Signed)
History and Physical Interval Note:  09/10/2020 8:17 AM  Jill Garza  has presented today for surgery, with the diagnosis of parathyroid.  The various methods of treatment have been discussed with the patient and family. After consideration of risks, benefits and other options for treatment, the patient has consented to    Procedure(s): PARATHYROIDECTOMY (N/A) POSSIBLE NECK EXPLORATION (N/A) as a surgical intervention.    The patient's history has been reviewed, patient examined, no change in status, stable for surgery.  I have reviewed the patient's chart and labs.  Questions were answered to the patient's satisfaction.    Armandina Gemma, MD [facility] Office: [place]

## 2020-09-10 NOTE — Anesthesia Postprocedure Evaluation (Signed)
Anesthesia Post Note  Patient: Jill Garza [facility]  Procedure(s) Performed: PARATHYROIDECTOMY (N/A ) POSSIBLE NECK EXPLORATION (N/A )     Patient location during evaluation: PACU Anesthesia Type: General Level of consciousness: awake and alert, oriented and patient cooperative Pain management: pain level controlled Vital Signs Assessment: post-procedure vital signs reviewed and stable Respiratory status: spontaneous breathing, nonlabored ventilation and respiratory function stable Cardiovascular status: blood pressure returned to baseline and stable Postop Assessment: no apparent nausea or vomiting Anesthetic complications: no   No complications documented.  Last Vitals:  Vitals:   09/10/20 1030 09/10/20 1050  BP: (!) 144/69 (!) 145/68  Pulse: 67 72  Resp: 17 16  Temp:    SpO2: 91% 94%    Last Pain:  Vitals:   09/10/20 1050  TempSrc:   PainSc: [place]

## 2020-09-10 NOTE — Transfer of Care (Signed)
Immediate Anesthesia Transfer of Care Note  Patient: Jill Garza [facility]  Procedure(s) Performed: PARATHYROIDECTOMY (N/A ) POSSIBLE NECK EXPLORATION (N/A )  Patient Location: PACU  Anesthesia Type:General  Level of Consciousness: awake, alert , oriented and patient cooperative  Airway & Oxygen Therapy: Patient Spontanous Breathing and Patient connected to face mask  Post-op Assessment: Report given to RN and Post -op Vital signs reviewed and stable  Post vital signs: Reviewed and stable  Last Vitals:  Vitals Value Taken Time  BP 153/71 09/10/20 0954  Temp    Pulse 70 09/10/20 0956  Resp 16 09/10/20 0956  SpO2 94 % 09/10/20 0956  Vitals shown include unvalidated device data.  Last Pain:  Vitals:   09/10/20 0647  TempSrc: Oral         Complications: No complications documented.

## 2020-09-10 NOTE — Anesthesia Procedure Notes (Addendum)
Procedure Name: Intubation Date/Time: 09/10/2020 8:37 AM Performed by: Claudia Desanctis, CRNA Pre-anesthesia Checklist: Patient identified, Emergency Drugs available, Suction available and Patient being monitored Patient Re-evaluated:Patient Re-evaluated prior to induction Oxygen Delivery Method: Circle system utilized Preoxygenation: Pre-oxygenation with 100% oxygen Induction Type: IV induction Ventilation: Mask ventilation without difficulty and Oral airway inserted - appropriate to patient size Laryngoscope Size: 2 and Miller Grade View: Grade II Tube type: Oral Tube size: 7.0 mm Number of attempts: 1 Airway Equipment and Method: Stylet Placement Confirmation: ETT inserted through vocal cords under direct vision,  positive ETCO2 and breath sounds checked- equal and bilateral Secured at: 21 cm Tube secured with: Tape Dental Injury: Teeth and Oropharynx as per pre-operative assessment

## 2020-09-10 NOTE — Op Note (Signed)
OPERATIVE REPORT - PARATHYROIDECTOMY  Preoperative diagnosis: Primary hyperparathyroidism  Postop diagnosis: Same  Procedure: Right inferior minimally invasive parathyroidectomy  Surgeon:  Armandina Gemma, MD  Assistant:  Erroll Luna, MD  Anesthesia: General endotracheal  Estimated blood loss: Minimal  Preparation: ChloraPrep  Indications: Patient is referred by Dr. Vivia Ewing for surgical evaluation and management of suspected primary hyperparathyroidism. Patient had been diagnosed as early as 2016 with hypercalcemia. Recently she had undergone further evaluation. Calcium levels in the past 6 months of range from 10.4-10.8. Intact PTH levels ranged from 45-83. A 24-hour urine collection for calcium has ranged from 397-464. Patient had been diagnosed with vitamin D deficiency and with supplementation is now normal with a recent 25-hydroxy vitamin D level is 36.8. Bone density scanning had shown evidence of osteopenia. Patient complains of fatigue. Patient underwent imaging studies including an ultrasound, nuclear medicine parathyroid scan, and 4D CT scan of the neck.  Of these studies, only the sestamibi scan suggested the presence of a right inferior adenoma.  Patient now comes to surgery for neck exploration and parathyroidectomy.  Procedure: The patient was prepared in the pre-operative holding area. The patient was brought to the operating room and placed in a supine position on the operating room table. Following administration of general anesthesia, the patient was positioned and then prepped and draped in the usual strict aseptic fashion. After ascertaining that an adequate level of anesthesia been achieved, a neck incision was made with a #15 blade. Dissection was carried through subcutaneous tissues and platysma. Hemostasis was obtained with the electrocautery. Skin flaps were developed circumferentially and a Weitlander retractor was placed for exposure.  Strap muscles  were incised in the midline. Strap muscles were reflected laterally exposing the thyroid lobe. With gentle blunt dissection the thyroid lobe was mobilized.  Dissection was carried through adipose tissue and an enlarged parathyroid gland was identified at the inferior pole of the right thyroid lobe posteriorly. It was gently mobilized. Vascular structures were divided witth the electrocautery. Care was taken to avoid the recurrent laryngeal nerve and the esophagus. The parathyroid gland was completely excised. It was submitted to pathology where frozen section by Dr. Vicente Males confirmed hypercellular parathyroid tissue consistent with adenoma.  Neck was irrigated with warm saline and good hemostasis was noted. Fibrillar was placed in the operative field. Strap muscles were approximated in the midline with interrupted 3-0 Vicryl sutures. Platysma was closed with interrupted 3-0 Vicryl sutures. Marcaine was infiltrated circumferentially. Skin was closed with a running 4-0 Monocryl subcuticular suture. Wound was washed and dried and Dermabond was applied. Patient was awakened from anesthesia and brought to the recovery room. The patient tolerated the procedure well.   Armandina Gemma, MD [facility] Office: [phone]

## 2020-09-11 ENCOUNTER — Encounter (HOSPITAL_COMMUNITY): Payer: Self-pay | Admitting: Surgery

## 2020-09-11 LAB — SURGICAL PATHOLOGY

## 2020-09-15 ENCOUNTER — Other Ambulatory Visit: Payer: Self-pay

## 2020-09-15 ENCOUNTER — Encounter: Payer: Self-pay | Admitting: Physical Therapy

## 2020-09-15 ENCOUNTER — Ambulatory Visit: Payer: Medicare Other | Attending: Nurse Practitioner | Admitting: Physical Therapy

## 2020-09-15 DIAGNOSIS — M5442 Lumbago with sciatica, left side: Secondary | ICD-10-CM | POA: Insufficient documentation

## 2020-09-15 DIAGNOSIS — R252 Cramp and spasm: Secondary | ICD-10-CM | POA: Insufficient documentation

## 2020-09-15 DIAGNOSIS — G8929 Other chronic pain: Secondary | ICD-10-CM | POA: Insufficient documentation

## 2020-09-15 DIAGNOSIS — M6281 Muscle weakness (generalized): Secondary | ICD-10-CM | POA: Insufficient documentation

## 2020-09-15 DIAGNOSIS — R293 Abnormal posture: Secondary | ICD-10-CM | POA: Insufficient documentation
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
## 2020-09-17 ENCOUNTER — Other Ambulatory Visit: Payer: Self-pay

## 2020-09-17 ENCOUNTER — Ambulatory Visit: Payer: Medicare Other | Admitting: Physical Therapy

## 2020-09-17 ENCOUNTER — Encounter: Payer: Self-pay | Admitting: Physical Therapy

## 2020-09-17 DIAGNOSIS — R293 Abnormal posture: Secondary | ICD-10-CM

## 2020-09-17 DIAGNOSIS — M6281 Muscle weakness (generalized): Secondary | ICD-10-CM | POA: Diagnosis not present

## 2020-09-17 DIAGNOSIS — G8929 Other chronic pain: Secondary | ICD-10-CM

## 2020-09-17 DIAGNOSIS — R252 Cramp and spasm: Secondary | ICD-10-CM | POA: Diagnosis not present

## 2020-09-17 DIAGNOSIS — M5442 Lumbago with sciatica, left side: Secondary | ICD-10-CM | POA: Diagnosis not present

## 2020-09-17 NOTE — Therapy (Signed)
[place] [place], [place], [address] Phone: [phone]   Fax:  [phone]  Physical Therapy Treatment/Discharge  Patient Details  Name: Jill Garza MRN: [id number] Date of Birth: 01-24-1951 Referring Provider (PT): Lauree Chandler, NP   Encounter Date: 09/17/2020   PT End of Session - 09/17/20 1451    Visit Number 4    Number of Visits 13    Date for PT Re-Evaluation 10/09/20    Authorization Type Medicare & AARP    PT Start Time 1430    PT Stop Time 1455    PT Time Calculation (min) 25 min    Activity Tolerance Patient tolerated treatment well    Behavior During Therapy [facility] for tasks assessed/performed           Past Medical History:  Diagnosis Date  . Arthritis   . Cancer ([place])    melanoma- right leg  . Chronic kidney disease   . Depression   . GERD (gastroesophageal reflux disease)    pt denies   . History of hiatal hernia   . Hypercholesterolemia   . Hyperparathyroidism ([place])   . Hypertension   . Sweat, sweating, excessive   . Type 2 diabetes mellitus ([place])     Past Surgical History:  Procedure Laterality Date  . ABDOMINAL HYSTERECTOMY  1979  . APPENDECTOMY  1964  . BREAST EXCISIONAL BIOPSY Left    benign  . BREAST LUMPECTOMY WITH RADIOACTIVE SEED LOCALIZATION Left 12/31/2014   Procedure: LEFT BREAST LUMPECTOMY WITH RADIOACTIVE SEED LOCALIZATION;  Surgeon: Donnie Mesa, MD;  Location: [facility];  Service: General;  Laterality: Left;  . CARPAL TUNNEL RELEASE Right 1977  . CARPAL TUNNEL RELEASE Left 2007  . CATARACT EXTRACTION Left 04/13/2014   Dr. Herbert Deaner  . CATARACT EXTRACTION Right 06/10/2014   Dr. Herbert Deaner  . CHOLECYSTECTOMY N/A 07/14/2017   Procedure: LAPAROSCOPIC CHOLECYSTECTOMY;  Surgeon: Ileana Roup, MD;  Location: [facility];  Service: General;  Laterality: N/A;  . COLONOSCOPY  2009  . JOINT REPLACEMENT    . MELANOMA EXCISION    . PARATHYROIDECTOMY  N/A 09/10/2020   Procedure: PARATHYROIDECTOMY;  Surgeon: Armandina Gemma, MD;  [facility];  Service: General;  Laterality: N/A;  . THYROID EXPLORATION N/A 09/10/2020   Procedure: POSSIBLE NECK EXPLORATION;  Surgeon: Armandina Gemma, MD;  [facility];  Service: General;  Laterality: N/A;  . TONSILLECTOMY  1962  . TOTAL KNEE ARTHROPLASTY Left 2012  . TOTAL KNEE ARTHROPLASTY Right 2008    There were no vitals filed for this visit.   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 Melanoma which was excised.    Currently in Pain? No/denies                             [facility] Adult PT Treatment/Exercise - 09/17/20 0001      Lumbar Exercises: Aerobic   Nustep L5 x 6 mn      Lumbar Exercises: Machines for Strengthening   Cybex Knee Extension 10lb 2x10    Cybex Knee Flexion 25lb 2x10    Other Lumbar Machine Exercise Rows & Lats 25lb 2x10      Lumbar Exercises: Standing   Shoulder Extension Strengthening;Both;20  reps    Shoulder Extension Limitations 5                    PT Short Term Goals - 09/17/20 1454      PT SHORT TERM GOAL #1   Title Independent with initial HEP    Status Achieved             PT Long Term Goals - 09/17/20 1436      PT LONG TERM GOAL #1   Title Independent with advanced HEP    Status Achieved      PT LONG TERM GOAL #3   Title Pt will report ability to stand at least 45 minutes to cook a meal in kitchen with </= 2/10 pain    Status Partially Met                 Plan - 09/17/20 1452    Clinical Impression Statement Pt enters clinic reporting no pain and that she was pleased with her current functional status. Pt stated she did not think she needed therapy anymore. No issues with today's interventions.  Postural cues needed with standing extensions   Personal Factors and Comorbidities Comorbidity 3+    Stability/Clinical Decision Making Evolving/Moderate complexity    Rehab Potential Good    PT Frequency 2x / week    PT Treatment/Interventions ADLs/Self Care Home Management;Cryotherapy;Electrical Stimulation;Iontophoresis 4mg /ml Dexamethasone;Moist Heat;Neuromuscular re-education;Therapeutic exercise;Therapeutic activities;Patient/family education;Taping;Manual techniques;Energy conservation;Cognitive remediation    PT Next Visit Plan LE strength, spine/LE flexbility, core strength and stability as tolerated. Manual and modalities as needed.           Patient will benefit from skilled therapeutic intervention in order to improve the following deficits and impairments:  Difficulty walking,Increased muscle spasms,Decreased endurance,Pain,Decreased balance,Impaired flexibility,Hypomobility,Decreased strength,Decreased mobility  Visit Diagnosis: Abnormal posture  Chronic low back pain with left-sided sciatica, unspecified back pain laterality  Muscle weakness (generalized)     Problem List Patient Active Problem List   Diagnosis Date Noted  . Vitamin D insufficiency 08/30/2019  . Hyperparathyroidism, primary ([place]) 08/30/2019  . Palpitations 04/19/2018  . Dyslipidemia 04/19/2018  . Cholecystitis 07/14/2017  . Acute cholecystitis 07/14/2017  . Obesity (BMI 30-39.9) 04/21/2017  . Radial scar of breast 03/18/2016  . Gastroesophageal reflux disease 09/04/2015  . Other seasonal allergic rhinitis 09/04/2015  . Hiatal hernia with gastroesophageal reflux 06/24/2014  . Class 2 obesity due to excess calories with serious comorbidity and body mass index (BMI) of 37.0 to 37.9 in adult 06/24/2014  . Colon polyp 12/17/2013  . Routine general medical examination at a health care facility 12/17/2013  . Pain in the chest 12/17/2013  . Osteopenia 07/16/2013  . Essential hypertension, benign  07/16/2013  . Controlled type 2 diabetes mellitus with chronic kidney disease, without long-term current use of insulin ([place]) 04/09/2013  . Unspecified vitamin D deficiency 04/09/2013  . Depression 04/09/2013   PHYSICAL THERAPY DISCHARGE SUMMARY  Visits from Start of Care: 4   Plan: Patient agrees to discharge.  Patient goals were partially met. Patient is being discharged due to being pleased with the current functional level.  ?????      Scot Jun, PTA 09/17/2020, 2:54 PM  Sherlynn Stalls, PT, DPT   [place]. [place], [place], [address] Phone: [phone]   Fax:  [phone]  Name: Lavelle Berland MRN: [id number] Date of Birth: 1951-03-22

## 2020-09-22 ENCOUNTER — Ambulatory Visit: Payer: Medicare Other

## 2020-09-24 ENCOUNTER — Encounter: Payer: Self-pay | Admitting: Internal Medicine

## 2020-09-24 ENCOUNTER — Other Ambulatory Visit: Payer: Self-pay

## 2020-09-24 ENCOUNTER — Ambulatory Visit (INDEPENDENT_AMBULATORY_CARE_PROVIDER_SITE_OTHER): Payer: Medicare Other | Admitting: Internal Medicine

## 2020-09-24 ENCOUNTER — Encounter: Payer: Medicare Other | Admitting: Physical Therapy

## 2020-09-24 VITALS — BP 136/78 | HR 89 | Ht 64.0 in | Wt 224.1 lb

## 2020-09-24 DIAGNOSIS — E559 Vitamin D deficiency, unspecified: Secondary | ICD-10-CM

## 2020-09-24 DIAGNOSIS — E210 Primary hyperparathyroidism: Secondary | ICD-10-CM | POA: Diagnosis not present

## 2020-09-24 LAB — COMPREHENSIVE METABOLIC PANEL
ALT: 20 U/L (ref 0–35)
AST: 21 U/L (ref 0–37)
Albumin: 4.4 g/dL (ref 3.5–5.2)
Alkaline Phosphatase: 90 U/L (ref 39–117)
BUN: 23 mg/dL (ref 6–23)
CO2: 26 mEq/L (ref 19–32)
Calcium: 9.7 mg/dL (ref 8.4–10.5)
Chloride: 103 mEq/L (ref 96–112)
Creatinine, Ser: 1.44 mg/dL — ABNORMAL HIGH (ref 0.40–1.20)
GFR: 36.54 mL/min — ABNORMAL LOW (ref >60.00)
Glucose, Bld: 130 mg/dL — ABNORMAL HIGH (ref 70–99)
Potassium: 4.3 mEq/L (ref 3.5–5.1)
Sodium: 137 mEq/L (ref 135–145)
Total Bilirubin: 0.4 mg/dL (ref 0.2–1.2)
Total Protein: 7.4 g/dL (ref 6.0–8.3)

## 2020-09-24 LAB — VITAMIN D 25 HYDROXY (VIT D DEFICIENCY, FRACTURES): VITD: 46.13 ng/mL (ref 30.00–100.00)

## 2020-09-24 NOTE — Progress Notes (Signed)
Name: Jill Garza  MRN/ DOB: [id number], [birth year]    Age/ Sex: 70 y.o., female     PCP: Jill Chandler, NP   Reason for Endocrinology Evaluation: Hypercalcemia     Initial Endocrinology Clinic Visit: 04/30/2019    PATIENT IDENTIFIER: Jill Garza is a 70 y.o., female with a past medical history of T2DM, HTN and Hypercalcemia. She has followed with [facility] since 04/30/2019 for consultative assistance with management of her  Hypercalcemia .   HISTORICAL SUMMARY: The patient was first diagnosed with hypercalcemia in 2016.   Her DXA (07/19/2019) showed a low bone density with a Left  femoral neck T-Score of -1.8 CT scan (2019)- no nephrolithiasis 24- hr urine Ca/Cr ratio 0.049 with a urinary excretion of 464 mg in 07/2019  She is a retired Health visitor   She is S/P right inferior parathyroidectomy 09/2020 SUBJECTIVE:   Today (09/24/2020):  Jill Garza is here for a follow up on hypercalcemia.   Has chronic polyuria and polydipsia  Denies cramps  Denies perioral numbness  Has recent constipation   Vitamin D 3000 iu daily    HISTORY:  Past Medical History:  Past Medical History:  Diagnosis Date  . Arthritis   . Cancer ([place])    melanoma- right leg  . Chronic kidney disease   . Depression   . GERD (gastroesophageal reflux disease)    pt denies   . History of hiatal hernia   . Hypercholesterolemia   . Hyperparathyroidism ([place])   . Hypertension   . Sweat, sweating, excessive   . Type 2 diabetes mellitus ([place])    Past Surgical History:  Past Surgical History:  Procedure Laterality Date  . ABDOMINAL HYSTERECTOMY  1979  . APPENDECTOMY  1964  . BREAST EXCISIONAL BIOPSY Left    benign  . BREAST LUMPECTOMY WITH RADIOACTIVE SEED LOCALIZATION Left 12/31/2014   Procedure: LEFT BREAST LUMPECTOMY WITH RADIOACTIVE SEED LOCALIZATION;  Surgeon: Donnie Mesa, MD;  Location: [facility];  Service: General;  Laterality: Left;  .  CARPAL TUNNEL RELEASE Right 1977  . CARPAL TUNNEL RELEASE Left 2007  . CATARACT EXTRACTION Left 04/13/2014   Dr. Herbert Deaner  . CATARACT EXTRACTION Right 06/10/2014   Dr. Herbert Deaner  . CHOLECYSTECTOMY N/A 07/14/2017   Procedure: LAPAROSCOPIC CHOLECYSTECTOMY;  Surgeon: Ileana Roup, MD;  Location: [facility];  Service: General;  Laterality: N/A;  . COLONOSCOPY  2009  . JOINT REPLACEMENT    . MELANOMA EXCISION    . PARATHYROIDECTOMY N/A 09/10/2020   Procedure: PARATHYROIDECTOMY;  Surgeon: Armandina Gemma, MD;  [facility];  Service: General;  Laterality: N/A;  . THYROID EXPLORATION N/A 09/10/2020   Procedure: POSSIBLE NECK EXPLORATION;  Surgeon: Armandina Gemma, MD;  [facility];  Service: General;  Laterality: N/A;  . TONSILLECTOMY  1962  . TOTAL KNEE ARTHROPLASTY Left 2012  . TOTAL KNEE ARTHROPLASTY Right 2008    Social History:  reports that she quit smoking about 34 years ago. Her smoking use included cigarettes. She has a 15.00 pack-year smoking history. She has never used smokeless tobacco. She reports current alcohol use. She reports that she does not use drugs. Family History:  Family History  Problem Relation Age of Onset  . Heart attack Father        Died of MI age 39  . Pulmonary embolism Mother        Multiple, B/L   . Hypertension Brother   . Heart disease Brother 29  .  Diabetes Brother   . High Cholesterol Brother   . Hypertension Brother   . High Cholesterol Brother   . Hypertension Sister   . Diabetes type II Sister   . High Cholesterol Sister   . Alcohol abuse Brother   . Reye's syndrome Daughter   . Hepatitis Brother   . Stroke Other   . Cancer Maternal Grandmother 23  . Colon cancer Neg Hx   . Esophageal cancer Neg Hx   . Stomach cancer Neg Hx   . Rectal cancer Neg Hx      HOME MEDICATIONS: Allergies as of 09/24/2020      Reactions   Penicillins    Rash    Ivp Dye [iodinated Diagnostic Agents]    Hives    Percocet [oxycodone-acetaminophen]    Itching        Medication List       Accurate as of Sep 24, 2020 10:31 AM. If you have any questions, ask your nurse or doctor.        amLODipine 5 MG tablet Commonly known as: NORVASC Take 1 tablet (5 mg total) by mouth daily.   clindamycin 300 MG capsule Commonly known as: CLEOCIN 4 tablets prior to dental work   fenofibrate 145 MG tablet Commonly known as: TRICOR Take 1 tablet by mouth once daily   glipiZIDE 5 MG tablet Commonly known as: GLUCOTROL TAKE 1 TABLET BY MOUTH TWICE DAILY BEFORE MEAL(S) What changed: See the new instructions.   losartan 100 MG tablet Commonly known as: COZAAR Take 1 tablet by mouth once daily   omeprazole 20 MG capsule Commonly known as: PRILOSEC Take 1 capsule (20 mg total) by mouth daily.   PARoxetine 30 MG tablet Commonly known as: PAXIL Take 1 tablet by mouth once daily   rosuvastatin 5 MG tablet Commonly known as: CRESTOR TAKE 1 TABLET BY MOUTH THREE TIMES A WEEK What changed: See the new instructions.   traMADol 50 MG tablet Commonly known as: ULTRAM Take 1-2 tablets (50-100 mg total) by mouth every 6 (six) hours as needed for moderate pain.   Vitamin D3 50 MCG (2000 UT) Tabs Take 2,000 Units by mouth daily.   Vitamin D3 25 MCG (1000 UT) Caps Take 1,000 Units by mouth daily.         OBJECTIVE:   PHYSICAL EXAM: VS:BP 136/78   Pulse 89   Ht 5\' 4"  (1.626 m)   Wt 224 lb 2 oz (101.7 kg)   SpO2 98%   BMI 38.47 kg/m   EXAM: General: Pt appears well and is in NAD  Neck: General: Supple without adenopathy. Thyroid: Thyroid size normal.  Right inferior neck incision is clear with surrounding swelling   Lungs: Clear with good BS bilat with no rales, rhonchi, or wheezes  Heart: Auscultation: RRR.  Abdomen: Normoactive bowel sounds, soft, nontender, without masses or organomegaly palpable  Extremities:  BL LE: No pretibial edema normal ROM and strength.  Mental Status: Judgment, insight: Intact Orientation: Oriented to time,  place, and person Mood and affect: No depression, anxiety, or agitation     DATA REVIEWED: Results for Jill Garza (MRN [id number]) as of 09/25/2020 11:39  Ref. Range 09/24/2020 10:55  Sodium Latest Ref Range: 135 - 145 mEq/L 137  Potassium Latest Ref Range: 3.5 - 5.1 mEq/L 4.3  Chloride Latest Ref Range: 96 - 112 mEq/L 103  CO2 Latest Ref Range: 19 - 32 mEq/L 26  Glucose Latest Ref Range: 70 - 99 mg/dL 130 (H)  BUN Latest Ref Range: 6 - 23 mg/dL 23  Creatinine Latest Ref Range: 0.40 - 1.20 mg/dL 1.44 (H)  Calcium Latest Ref Range: 8.4 - 10.5 mg/dL 9.7  Alkaline Phosphatase Latest Ref Range: 39 - 117 U/L 90  Albumin Latest Ref Range: 3.5 - 5.2 g/dL 4.4  AST Latest Ref Range: 0 - 37 U/L 21  ALT Latest Ref Range: 0 - 35 U/L 20  Total Protein Latest Ref Range: 6.0 - 8.3 g/dL 7.4  Total Bilirubin Latest Ref Range: 0.2 - 1.2 mg/dL 0.4  GFR Latest Ref Range: >60.00 mL/min 36.54 (L)  VITD Latest Ref Range: 30.00 - 100.00 ng/mL 46.13    ASSESSMENT / PLAN / RECOMMENDATIONS:   1. Primary Hyperparathyroidism:  - She is S/P right inferior parathyroidectomy 09/2020 -There are no postop complications -Repeat BMP shows normal calcium level -Patient needs to maintain normal calcium consumption to protect bone health, she may either continue to consume 2-3 servings of dietary calcium daily or she may start using a multivitamin    2. Vitamin D Insufficiency:    - Normalized  - Continue Vitamin D3  3000 iu daily     F/U as needed    Signed electronically by: Mack Guise, MD  [facility]  [facility] [place], [place] [address] Phone: [phone] FAX: [phone]      CC: Jill Chandler, NP [place] [address] Phone: [phone]  Fax: [phone]   Return to Endocrinology clinic as below: Future Appointments  Date Time Provider [place]  01/26/2021  8:15 AM [facility] LAB  [facility] None  01/29/2021 10:00 AM Jill Chandler, NP [facility] None  04/27/2021  3:15 PM Jill Chandler, NP [facility] None

## 2020-09-24 NOTE — Patient Instructions (Signed)
-   Continue Vitamin D 3000 iu daily

## 2020-09-28 DIAGNOSIS — H18523 Epithelial (juvenile) corneal dystrophy, bilateral: Secondary | ICD-10-CM | POA: Diagnosis not present

## 2020-09-28 DIAGNOSIS — E119 Type 2 diabetes mellitus without complications: Secondary | ICD-10-CM | POA: Diagnosis not present

## 2020-09-28 DIAGNOSIS — H532 Diplopia: Secondary | ICD-10-CM | POA: Diagnosis not present

## 2020-09-29 ENCOUNTER — Encounter: Payer: Medicare Other | Admitting: Physical Therapy

## 2020-09-30 ENCOUNTER — Other Ambulatory Visit: Payer: Self-pay | Admitting: Ophthalmology

## 2020-09-30 DIAGNOSIS — H532 Diplopia: Secondary | ICD-10-CM

## 2020-09-30 DIAGNOSIS — E210 Primary hyperparathyroidism: Secondary | ICD-10-CM | POA: Diagnosis not present

## 2020-10-01 ENCOUNTER — Encounter: Payer: Medicare Other | Admitting: Physical Therapy

## 2020-10-11 ENCOUNTER — Other Ambulatory Visit: Payer: Self-pay | Admitting: Nurse Practitioner

## 2020-10-11 DIAGNOSIS — I10 Essential (primary) hypertension: Secondary | ICD-10-CM

## 2020-10-11 DIAGNOSIS — E119 Type 2 diabetes mellitus without complications: Secondary | ICD-10-CM

## 2020-10-12 ENCOUNTER — Other Ambulatory Visit: Payer: Self-pay | Admitting: Nurse Practitioner

## 2020-10-12 DIAGNOSIS — K219 Gastro-esophageal reflux disease without esophagitis: Secondary | ICD-10-CM

## 2020-10-17 ENCOUNTER — Other Ambulatory Visit: Payer: Self-pay

## 2020-10-17 ENCOUNTER — Ambulatory Visit
Admission: RE | Admit: 2020-10-17 | Discharge: 2020-10-17 | Disposition: A | Discharge status: Home / Self Care | Payer: Medicare Other | Source: Ambulatory Visit | Attending: Ophthalmology | Admitting: Ophthalmology

## 2020-10-17 DIAGNOSIS — H538 Other visual disturbances: Secondary | ICD-10-CM | POA: Diagnosis not present

## 2020-10-17 DIAGNOSIS — H532 Diplopia: Secondary | ICD-10-CM

## 2020-10-17 DIAGNOSIS — Z85820 Personal history of malignant melanoma of skin: Secondary | ICD-10-CM | POA: Diagnosis not present

## 2020-10-17 MED ORDER — GADOBENATE DIMEGLUMINE 529 MG/ML IV SOLN
20.0000 mL | Freq: Once | INTRAVENOUS | Status: AC | PRN
  Administered 2020-10-17: 20 mL via INTRAVENOUS

## 2020-11-13 DIAGNOSIS — H532 Diplopia: Secondary | ICD-10-CM | POA: Diagnosis not present

## 2020-11-13 DIAGNOSIS — H18523 Epithelial (juvenile) corneal dystrophy, bilateral: Secondary | ICD-10-CM | POA: Diagnosis not present

## 2020-11-19 ENCOUNTER — Other Ambulatory Visit: Payer: Self-pay | Admitting: Nurse Practitioner

## 2020-11-19 DIAGNOSIS — I10 Essential (primary) hypertension: Secondary | ICD-10-CM

## 2020-11-19 DIAGNOSIS — E785 Hyperlipidemia, unspecified: Secondary | ICD-10-CM

## 2020-11-23 ENCOUNTER — Other Ambulatory Visit: Payer: Self-pay | Admitting: *Deleted

## 2020-11-23 DIAGNOSIS — I10 Essential (primary) hypertension: Secondary | ICD-10-CM

## 2020-11-23 MED ORDER — LOSARTAN POTASSIUM 100 MG PO TABS: 100.0000 mg | ORAL_TABLET | Freq: Every day | ORAL | 1 refills | Status: DC

## 2020-11-23 NOTE — Telephone Encounter (Signed)
Patient called requesting refill on medication. Stated that the pharmacy does not have any remaining refills.

## 2020-12-21 DIAGNOSIS — Z20822 Contact with and (suspected) exposure to covid-19: Secondary | ICD-10-CM | POA: Diagnosis not present

## 2021-01-05 DIAGNOSIS — L814 Other melanin hyperpigmentation: Secondary | ICD-10-CM | POA: Diagnosis not present

## 2021-01-05 DIAGNOSIS — Z85820 Personal history of malignant melanoma of skin: Secondary | ICD-10-CM | POA: Diagnosis not present

## 2021-01-05 DIAGNOSIS — D225 Melanocytic nevi of trunk: Secondary | ICD-10-CM | POA: Diagnosis not present

## 2021-01-05 DIAGNOSIS — D1801 Hemangioma of skin and subcutaneous tissue: Secondary | ICD-10-CM | POA: Diagnosis not present

## 2021-01-05 DIAGNOSIS — L905 Scar conditions and fibrosis of skin: Secondary | ICD-10-CM | POA: Diagnosis not present

## 2021-01-07 DIAGNOSIS — Z20822 Contact with and (suspected) exposure to covid-19: Secondary | ICD-10-CM | POA: Diagnosis not present

## 2021-01-13 ENCOUNTER — Other Ambulatory Visit: Payer: Self-pay | Admitting: Nurse Practitioner

## 2021-01-13 ENCOUNTER — Other Ambulatory Visit: Payer: Self-pay | Admitting: Adult Health

## 2021-01-13 DIAGNOSIS — I10 Essential (primary) hypertension: Secondary | ICD-10-CM

## 2021-01-13 DIAGNOSIS — K219 Gastro-esophageal reflux disease without esophagitis: Secondary | ICD-10-CM

## 2021-01-13 DIAGNOSIS — E119 Type 2 diabetes mellitus without complications: Secondary | ICD-10-CM

## 2021-01-26 ENCOUNTER — Other Ambulatory Visit: Payer: Self-pay

## 2021-01-26 ENCOUNTER — Other Ambulatory Visit: Payer: Medicare Other

## 2021-01-26 ENCOUNTER — Other Ambulatory Visit: Payer: Self-pay | Admitting: Nurse Practitioner

## 2021-01-26 DIAGNOSIS — E1140 Type 2 diabetes mellitus with diabetic neuropathy, unspecified: Secondary | ICD-10-CM | POA: Diagnosis not present

## 2021-01-26 DIAGNOSIS — I10 Essential (primary) hypertension: Secondary | ICD-10-CM

## 2021-01-26 DIAGNOSIS — E785 Hyperlipidemia, unspecified: Secondary | ICD-10-CM | POA: Diagnosis not present

## 2021-01-26 DIAGNOSIS — Z1231 Encounter for screening mammogram for malignant neoplasm of breast: Secondary | ICD-10-CM

## 2021-01-27 LAB — COMPLETE METABOLIC PANEL WITH GFR
AG Ratio: 1.8 (calc) (ref 1.0–2.5)
ALT: 17 U/L (ref 6–29)
AST: 18 U/L (ref 10–35)
Albumin: 4.4 g/dL (ref 3.6–5.1)
Alkaline phosphatase (APISO): 40 U/L (ref 37–153)
BUN/Creatinine Ratio: 18 (calc) (ref 6–22)
BUN: 25 mg/dL (ref 7–25)
CO2: 23 mmol/L (ref 20–32)
Calcium: 9.8 mg/dL (ref 8.6–10.4)
Chloride: 104 mmol/L (ref 98–110)
Creat: 1.36 mg/dL — ABNORMAL HIGH (ref 0.60–1.00)
Globulin: 2.4 g/dL (calc) (ref 1.9–3.7)
Glucose, Bld: 141 mg/dL — ABNORMAL HIGH (ref 65–99)
Potassium: 4.2 mmol/L (ref 3.5–5.3)
Sodium: 137 mmol/L (ref 135–146)
Total Bilirubin: 0.5 mg/dL (ref 0.2–1.2)
Total Protein: 6.8 g/dL (ref 6.1–8.1)
eGFR: 41 mL/min/{1.73_m2} — ABNORMAL LOW (ref >=60)

## 2021-01-27 LAB — CBC WITH DIFFERENTIAL/PLATELET
Absolute Monocytes: 337 {cells}/uL (ref 200–950)
Basophils Absolute: 33 {cells}/uL (ref 0–200)
Basophils Relative: 0.5 %
Eosinophils Absolute: 73 {cells}/uL (ref 15–500)
Eosinophils Relative: 1.1 %
HCT: 41.9 % (ref 35.0–45.0)
Hemoglobin: 13.5 g/dL (ref 11.7–15.5)
Lymphs Abs: 2303 {cells}/uL (ref 850–3900)
MCH: 28.2 pg (ref 27.0–33.0)
MCHC: 32.2 g/dL (ref 32.0–36.0)
MCV: 87.7 fL (ref 80.0–100.0)
MPV: 9.4 fL (ref 7.5–12.5)
Monocytes Relative: 5.1 %
Neutro Abs: 3854 {cells}/uL (ref 1500–7800)
Neutrophils Relative %: 58.4 %
Platelets: 277 Thousand/uL (ref 140–400)
RBC: 4.78 Million/uL (ref 3.80–5.10)
RDW: 12.6 % (ref 11.0–15.0)
Total Lymphocyte: 34.9 %
WBC: 6.6 Thousand/uL (ref 3.8–10.8)

## 2021-01-27 LAB — HEMOGLOBIN A1C
Hgb A1c MFr Bld: 6.7 %{Hb} — ABNORMAL HIGH
Mean Plasma Glucose: 146 mg/dL
eAG (mmol/L): 8.1 mmol/L

## 2021-01-27 LAB — LIPID PANEL
Cholesterol: 117 mg/dL (ref <200)
HDL: 44 mg/dL — ABNORMAL LOW (ref >=50)
LDL Cholesterol (Calc): 52 mg/dL (calc)
Non-HDL Cholesterol (Calc): 73 mg/dL (calc) (ref <130)
Total CHOL/HDL Ratio: 2.7 (calc) (ref <5.0)
Triglycerides: 125 mg/dL (ref <150)

## 2021-01-29 ENCOUNTER — Encounter: Payer: Self-pay | Admitting: Nurse Practitioner

## 2021-01-29 ENCOUNTER — Other Ambulatory Visit: Payer: Self-pay

## 2021-01-29 ENCOUNTER — Ambulatory Visit (INDEPENDENT_AMBULATORY_CARE_PROVIDER_SITE_OTHER): Payer: Medicare Other | Admitting: Nurse Practitioner

## 2021-01-29 VITALS — BP 116/74 | HR 81 | Temp 97.1°F | Ht 64.0 in | Wt 224.0 lb

## 2021-01-29 DIAGNOSIS — Z23 Encounter for immunization: Secondary | ICD-10-CM

## 2021-01-29 DIAGNOSIS — E785 Hyperlipidemia, unspecified: Secondary | ICD-10-CM | POA: Diagnosis not present

## 2021-01-29 DIAGNOSIS — E1140 Type 2 diabetes mellitus with diabetic neuropathy, unspecified: Secondary | ICD-10-CM | POA: Diagnosis not present

## 2021-01-29 DIAGNOSIS — G8929 Other chronic pain: Secondary | ICD-10-CM | POA: Diagnosis not present

## 2021-01-29 DIAGNOSIS — M5442 Lumbago with sciatica, left side: Secondary | ICD-10-CM

## 2021-01-29 DIAGNOSIS — I10 Essential (primary) hypertension: Secondary | ICD-10-CM

## 2021-01-29 NOTE — Progress Notes (Signed)
Careteam: Patient Care Team: Lauree Chandler, NP as PCP - General (Geriatric Medicine) Minus Breeding, MD as PCP - Cardiology (Cardiology) Alanda Slim Neena Rhymes, MD as Consulting Physician (Ophthalmology)  PLACE OF SERVICE:  [facility]  Advanced Directive information    Allergies  Allergen Reactions   Penicillins     Rash    Ivp Dye [Iodinated Diagnostic Agents]     Hives    Percocet [Oxycodone-Acetaminophen]     Itching     Chief Complaint  Patient presents with   Medical Management of Chronic Issues    6 month follow-up and discuss labs (copy printed). Flu vaccine today. Per patient exam completed on 09/28/2020, Dr.Mincey.      HPI: Patient is a 70 y.o. female for routine follow up.   Doing well. S/p parathyroidectomy. Feels better   DM2- no hypoglycemia noted. Continues on   Htn-controlled on norvasc and losartan.   Physical therapy really improved back pain.   Takes crestor three times weekly. Will have short spurts of cramping to legs at night. Lasting 10 secs.   Review of Systems:  Review of Systems  Constitutional:  Negative for chills, fever and weight loss.  HENT:  Negative for tinnitus.   Respiratory:  Negative for cough, sputum production and shortness of breath.   Cardiovascular:  Negative for chest pain, palpitations and leg swelling.  Gastrointestinal:  Negative for abdominal pain, constipation, diarrhea and heartburn.  Genitourinary:  Negative for dysuria, frequency and urgency.  Musculoskeletal:  Negative for back pain, falls, joint pain and myalgias.  Skin: Negative.   Neurological:  Negative for dizziness and headaches.  Psychiatric/Behavioral:  Negative for depression and memory loss. The patient does not have insomnia.    Past Medical History:  Diagnosis Date   Arthritis    Cancer ([place])    melanoma- right leg   Chronic kidney disease    Depression    GERD (gastroesophageal reflux disease)    pt denies    History of hiatal  hernia    Hypercholesterolemia    Hyperparathyroidism ([place])    Hypertension    Sweat, sweating, excessive    Type 2 diabetes mellitus ([place])    Past Surgical History:  Procedure Laterality Date   ABDOMINAL HYSTERECTOMY  1979   APPENDECTOMY  1964   BREAST EXCISIONAL BIOPSY Left    benign   BREAST LUMPECTOMY WITH RADIOACTIVE SEED LOCALIZATION Left 12/31/2014   Procedure: LEFT BREAST LUMPECTOMY WITH RADIOACTIVE SEED LOCALIZATION;  Surgeon: Donnie Mesa, MD;  Location: [facility];  Service: General;  Laterality: Left;   CARPAL TUNNEL RELEASE Right [place] Left 2007   CATARACT EXTRACTION Left 04/13/2014   Dr. Herbert Deaner   CATARACT EXTRACTION Right 06/10/2014   Dr. Herbert Deaner   CHOLECYSTECTOMY N/A 07/14/2017   Procedure: LAPAROSCOPIC CHOLECYSTECTOMY;  Surgeon: Ileana Roup, MD;  Location: [facility];  Service: General;  Laterality: N/A;   COLONOSCOPY  2009   JOINT REPLACEMENT     MELANOMA EXCISION     PARATHYROIDECTOMY N/A 09/10/2020   Procedure: PARATHYROIDECTOMY;  Surgeon: Armandina Gemma, MD;  [facility];  Service: General;  Laterality: N/A;   THYROID EXPLORATION N/A 09/10/2020   Procedure: POSSIBLE NECK EXPLORATION;  Surgeon: Armandina Gemma, MD;  [facility];  Service: General;  Laterality: N/A;   [place] Left 2012   TOTAL KNEE ARTHROPLASTY Right 2008   Social History:   reports that she quit smoking  about 34 years ago. Her smoking use included cigarettes. She has a 15.00 pack-year smoking history. She has never used smokeless tobacco. She reports current alcohol use. She reports that she does not use drugs.  Family History  Problem Relation Age of Onset   Heart attack Father        Died of MI age 65   Pulmonary embolism Mother        Multiple, B/L    Hypertension Brother    Heart disease Brother 7   Diabetes Brother    High Cholesterol Brother    Hypertension Brother    High Cholesterol Brother     Hypertension Sister    Diabetes type II Sister    High Cholesterol Sister    Alcohol abuse Brother    Reye's syndrome Daughter    Hepatitis Brother    Stroke Other    Cancer Maternal Grandmother 63   Colon cancer Neg Hx    Esophageal cancer Neg Hx    Stomach cancer Neg Hx    Rectal cancer Neg Hx     Medications: Patient's Medications  New Prescriptions   No medications on file  Previous Medications   AMLODIPINE (NORVASC) 5 MG TABLET    Take 1 tablet by mouth once daily   CLINDAMYCIN (CLEOCIN) 300 MG CAPSULE    4 tablets prior to dental work   FENOFIBRATE (TRICOR) 145 MG TABLET    Take 1 tablet by mouth once daily   GLIPIZIDE (GLUCOTROL) 5 MG TABLET    TAKE 1 TABLET BY MOUTH TWICE DAILY BEFORE MEAL(S)   LOSARTAN (COZAAR) 100 MG TABLET    Take 1 tablet (100 mg total) by mouth daily.   OMEPRAZOLE (PRILOSEC) 20 MG CAPSULE    Take 1 capsule by mouth once daily   PAROXETINE (PAXIL) 30 MG TABLET    Take 1 tablet by mouth once daily   ROSUVASTATIN (CRESTOR) 5 MG TABLET    TAKE 1 TABLET BY MOUTH THREE TIMES A WEEK  Modified Medications   No medications on file  Discontinued Medications   CHOLECALCIFEROL (VITAMIN D3) 25 MCG (1000 UT) CAPS    Take 1,000 Units by mouth daily.   CHOLECALCIFEROL (VITAMIN D3) 50 MCG (2000 UT) TABS    Take 2,000 Units by mouth daily.   TRAMADOL (ULTRAM) 50 MG TABLET    Take 1-2 tablets (50-100 mg total) by mouth every 6 (six) hours as needed for moderate pain.    Physical Exam:  Vitals:   01/29/21 0956  BP: 116/74  Pulse: 81  Temp: (!) 97.1 F (36.2 C)  TempSrc: Temporal  SpO2: 96%  Weight: 224 lb (101.6 kg)  Height: 5\' 4"  (1.626 m)   Body mass index is 38.45 kg/m. Wt Readings from Last 3 Encounters:  01/29/21 224 lb (101.6 kg)  09/24/20 224 lb 2 oz (101.7 kg)  09/10/20 221 lb (100.2 kg)    Physical Exam Constitutional:      General: She is not in acute distress.    Appearance: She is well-developed. She is not diaphoretic.  HENT:      Head: Normocephalic and atraumatic.     Mouth/Throat:     Pharynx: No oropharyngeal exudate.  Eyes:     Conjunctiva/sclera: Conjunctivae normal.     Pupils: Pupils are equal, round, and reactive to light.  Cardiovascular:     Rate and Rhythm: Normal rate and regular rhythm.     Heart sounds: Normal heart sounds.  Pulmonary:  Effort: Pulmonary effort is normal.     Breath sounds: Normal breath sounds.  Abdominal:     General: Bowel sounds are normal.     Palpations: Abdomen is soft.  Musculoskeletal:     Cervical back: Normal range of motion and neck supple.     Right lower leg: No edema.     Left lower leg: No edema.  Skin:    General: Skin is warm and dry.  Neurological:     Mental Status: She is alert.  Psychiatric:        Mood and Affect: Mood normal.    Labs reviewed: Basic Metabolic Panel: Recent Labs    09/07/20 1131 09/24/20 1055 01/26/21 0824  NA 138 137 137  K 4.4 4.3 4.2  CL 106 103 104  CO2 24 26 23   GLUCOSE 98 130* 141*  BUN 24* 23 25  CREATININE 1.22* 1.44* 1.36*  CALCIUM 10.6* 9.7 9.8   Liver Function Tests: Recent Labs    03/26/20 0833 07/24/20 1519 09/24/20 1055 01/26/21 0824  AST  --  19 21 18   ALT  --  18 20 17   ALKPHOS  --   --  90  --   BILITOT  --  0.3 0.4 0.5  PROT  --  6.8 7.4 6.8  ALBUMIN 4.2  --  4.4  --    No results for input(s): LIPASE, AMYLASE in the last 8760 hours. No results for input(s): AMMONIA in the last 8760 hours. CBC: Recent Labs    07/24/20 1519 09/07/20 1131 01/26/21 0824  WBC 7.5 5.6 6.6  NEUTROABS 4,200  --  3,854  HGB 13.5 12.9 13.5  HCT 41.1 41.3 41.9  MCV 87.4 90.0 87.7  PLT 268 276 277   Lipid Panel: Recent Labs    03/06/20 0852 01/26/21 0824  CHOL 108 117  HDL 42* 44*  LDLCALC 46 52  TRIG 117 125  CHOLHDL 2.6 2.7   TSH: No results for input(s): TSH in the last 8760 hours. A1C: Lab Results  Component Value Date   HGBA1C 6.7 (H) 01/26/2021     Assessment/Plan 1. Need for  influenza vaccination - Flu Vaccine QUAD High Dose(Fluad)  2. Type 2 diabetes mellitus with diabetic neuropathy, without long-term current use of insulin (HCC) -a1c at goal.  -Encouraged dietary compliance, routine foot care/monitoring and to keep up with diabetic eye exams through ophthalmology  -no low blood sugars.   3. Hyperlipidemia LDL goal <70 -continues on crestor 5 mg three times weekly with dietary modifications. LDL at goal. Discussed cramping as potential side effect to cresor. Would like to continue bc benefits are outweighing side effects.   4. Morbid obesity ([place]) -education provided on healthy weight loss through increase in physical activity and proper nutrition   5. Essential hypertension, benign --stable. Goal bp <140/90. Continue on current regimen with low sodium diet.   6. Chronic left-sided low back pain with left-sided sciatica Improved after PT  Next appt: 6 months,  Jill Garza, [facility] [phone]

## 2021-02-06 DIAGNOSIS — Z20822 Contact with and (suspected) exposure to covid-19: Secondary | ICD-10-CM | POA: Diagnosis not present

## 2021-02-06 DIAGNOSIS — Z23 Encounter for immunization: Secondary | ICD-10-CM | POA: Diagnosis not present

## 2021-02-08 ENCOUNTER — Other Ambulatory Visit: Payer: Self-pay

## 2021-02-08 ENCOUNTER — Ambulatory Visit
Admission: RE | Admit: 2021-02-08 | Discharge: 2021-02-08 | Disposition: A | Discharge status: Home / Self Care | Payer: Medicare Other | Source: Ambulatory Visit

## 2021-02-08 DIAGNOSIS — Z1231 Encounter for screening mammogram for malignant neoplasm of breast: Secondary | ICD-10-CM | POA: Diagnosis not present

## 2021-02-15 DIAGNOSIS — H532 Diplopia: Secondary | ICD-10-CM | POA: Diagnosis not present

## 2021-02-16 ENCOUNTER — Other Ambulatory Visit: Payer: Self-pay | Admitting: Nurse Practitioner

## 2021-03-09 DIAGNOSIS — Z20822 Contact with and (suspected) exposure to covid-19: Secondary | ICD-10-CM | POA: Diagnosis not present

## 2021-04-06 ENCOUNTER — Other Ambulatory Visit: Payer: Self-pay | Admitting: Nurse Practitioner

## 2021-04-06 DIAGNOSIS — E119 Type 2 diabetes mellitus without complications: Secondary | ICD-10-CM

## 2021-04-27 ENCOUNTER — Other Ambulatory Visit: Payer: Self-pay

## 2021-04-27 ENCOUNTER — Encounter: Payer: Medicare Other | Admitting: Nurse Practitioner

## 2021-04-30 ENCOUNTER — Other Ambulatory Visit: Payer: Self-pay | Admitting: Nurse Practitioner

## 2021-04-30 DIAGNOSIS — K219 Gastro-esophageal reflux disease without esophagitis: Secondary | ICD-10-CM

## 2021-04-30 DIAGNOSIS — I10 Essential (primary) hypertension: Secondary | ICD-10-CM

## 2021-05-04 ENCOUNTER — Other Ambulatory Visit: Payer: Self-pay

## 2021-05-04 ENCOUNTER — Encounter: Payer: Medicare Other | Admitting: Nurse Practitioner

## 2021-05-04 ENCOUNTER — Ambulatory Visit (INDEPENDENT_AMBULATORY_CARE_PROVIDER_SITE_OTHER): Payer: Medicare Other | Admitting: Nurse Practitioner

## 2021-05-04 ENCOUNTER — Encounter: Payer: Self-pay | Admitting: Nurse Practitioner

## 2021-05-04 ENCOUNTER — Telehealth: Payer: Self-pay

## 2021-05-04 DIAGNOSIS — Z0000 Encounter for general adult medical examination without abnormal findings: Secondary | ICD-10-CM | POA: Diagnosis not present

## 2021-05-04 NOTE — Telephone Encounter (Signed)
Ms. eilah, common are scheduled for a virtual visit with your provider today.    Just as we do with appointments in the office, we must obtain your consent to participate.  Your consent will be active for this visit and any virtual visit you may have with one of our providers in the next 365 days.    If you have a MyChart account, I can also send a copy of this consent to you electronically.  All virtual visits are billed to your insurance company just like a traditional visit in the office.  As this is a virtual visit, video technology does not allow for your provider to perform a traditional examination.  This may limit your provider's ability to fully assess your condition.  If your provider identifies any concerns that need to be evaluated in person or the need to arrange testing such as labs, EKG, etc, we will make arrangements to do so.    Although advances in technology are sophisticated, we cannot ensure that it will always work on either your end or our end.  If the connection with a video visit is poor, we may have to switch to a telephone visit.  With either a video or telephone visit, we are not always able to ensure that we have a secure connection.   I need to obtain your verbal consent now.   Are you willing to proceed with your visit today?   Keana Dueitt Otterson has provided verbal consent on 05/04/2021 for a virtual visit (video or telephone).   Carroll Kinds, [facility] 05/04/2021  3:29 PM

## 2021-05-04 NOTE — Progress Notes (Signed)
Subjective:   Jill Garza is a 70 y.o. female who presents for Medicare Annual (Subsequent) preventive examination.  Review of Systems     Cardiac Risk Factors include: advanced age (>68men, >55 women);sedentary lifestyle;family history of premature cardiovascular disease;obesity (BMI >30kg/m2);hypertension;diabetes mellitus;dyslipidemia     Objective:    There were no vitals filed for this visit. There is no height or weight on file to calculate BMI.  Advanced Directives 05/04/2021 09/10/2020 07/24/2020 05/28/2020 04/22/2020 10/23/2019 04/22/2019  Does Patient Have a Medical Advance Directive? Yes Yes Yes Yes Yes Yes Yes  Type of Paramedic of [place];Living will [place];Living will [place];Living will;Out of facility DNR (pink MOST or yellow form) Living will;Healthcare Power of [place];Out of facility DNR (pink MOST or yellow form) Out of facility DNR (pink MOST or yellow form);[place];Living will [place];Living will [place];Living will  Does patient want to make changes to medical advance directive? No - Patient declined No - Patient declined No - Patient declined No - Patient declined No - Patient declined No - Patient declined No - Patient declined  Copy of [place] in Chart? No - copy requested No - copy requested No - copy requested No - copy requested No - copy requested - No - copy requested  Would patient like information on creating a medical advance directive? - - - - - - -    Current Medications (verified) Outpatient Encounter Medications as of 05/04/2021  Medication Sig   amLODipine (NORVASC) 5 MG tablet Take 1 tablet by mouth once daily   clindamycin (CLEOCIN) 300 MG capsule 4 tablets prior to dental work   fenofibrate (TRICOR) 145 MG tablet Take 1 tablet by mouth once daily   glipiZIDE (GLUCOTROL) 5 MG tablet TAKE 1  TABLET BY MOUTH TWICE DAILY BEFORE MEAL(S) (Patient taking differently: 5 mg. TAKE 2 TABLETS BY MOUTH  DAILY)   losartan (COZAAR) 100 MG tablet Take 1 tablet (100 mg total) by mouth daily.   omeprazole (PRILOSEC) 20 MG capsule Take 1 capsule by mouth once daily   PARoxetine (PAXIL) 30 MG tablet Take 1 tablet by mouth once daily   rosuvastatin (CRESTOR) 5 MG tablet TAKE 1 TABLET BY MOUTH THREE TIMES A WEEK   No facility-administered encounter medications on file as of 05/04/2021.    Allergies (verified) Penicillins, Ivp dye [iodinated contrast media], and Percocet [oxycodone-acetaminophen]   History: Past Medical History:  Diagnosis Date   Arthritis    Cancer ([place])    melanoma- right leg   Chronic kidney disease    Depression    GERD (gastroesophageal reflux disease)    pt denies    History of hiatal hernia    Hypercholesterolemia    Hyperparathyroidism ([place])    Hypertension    Sweat, sweating, excessive    Type 2 diabetes mellitus ([place])    Past Surgical History:  Procedure Laterality Date   ABDOMINAL HYSTERECTOMY  1979   APPENDECTOMY  1964   BREAST EXCISIONAL BIOPSY Left 08/2014   benign   BREAST LUMPECTOMY WITH RADIOACTIVE SEED LOCALIZATION Left 12/31/2014   Procedure: LEFT BREAST LUMPECTOMY WITH RADIOACTIVE SEED LOCALIZATION;  Surgeon: Donnie Mesa, MD;  Location: [facility];  Service: General;  Laterality: Left;   CARPAL TUNNEL RELEASE Right [place] Left 2007   CATARACT EXTRACTION Left 04/13/2014   Dr. Herbert Deaner   CATARACT EXTRACTION Right 06/10/2014  Dr. Herbert Deaner   CHOLECYSTECTOMY N/A 07/14/2017   Procedure: LAPAROSCOPIC CHOLECYSTECTOMY;  Surgeon: Ileana Roup, MD;  Location[facility] OR;  Service: General;  Laterality: N/A;   COLONOSCOPY  2009   JOINT REPLACEMENT     MELANOMA EXCISION     PARATHYROIDECTOMY N/A 09/10/2020   Procedure: PARATHYROIDECTOMY;  Surgeon: Armandina Gemma, MD;  Location: [facility];  Service: General;   Laterality: N/A;   THYROID EXPLORATION N/A 09/10/2020   Procedure: POSSIBLE NECK EXPLORATION;  Surgeon: Armandina Gemma, MD;  Location: [facility];  Service: General;  Laterality: N/A;   TONSILLECTOMY  1962   TOTAL KNEE ARTHROPLASTY Left 2012   TOTAL KNEE ARTHROPLASTY Right 2008   Family History  Problem Relation Age of Onset   Heart attack Father        Died of MI age 68   Pulmonary embolism Mother        Multiple, B/L    Hypertension Brother    Heart disease Brother 33   Diabetes Brother    High Cholesterol Brother    Hypertension Brother    High Cholesterol Brother    Hypertension Sister    Diabetes type II Sister    High Cholesterol Sister    Alcohol abuse Brother    Reye's syndrome Daughter    Hepatitis Brother    Stroke Other    Cancer Maternal Grandmother 66   Colon cancer Neg Hx    Esophageal cancer Neg Hx    Stomach cancer Neg Hx    Rectal cancer Neg Hx    Social History   Socioeconomic History   Marital status: Married    Spouse name: Not on file   Number of children: Not on file   Years of education: Not on file   Highest education level: Not on file  Occupational History   Not on file  Tobacco Use   Smoking status: Former    Packs/day: 1.00    Years: 15.00    Pack years: 15.00    Types: Cigarettes    Quit date: 05/09/1986    Years since quitting: 35.0   Smokeless tobacco: Never  Vaping Use   Vaping Use: Never used  Substance and Sexual Activity   Alcohol use: Yes    Alcohol/week: 0.0 standard drinks    Comment: rare   Drug use: No   Sexual activity: Yes  Other Topics Concern   Not on file  Social History Narrative   Not on file   Social Determinants of Health   Financial Resource Strain: Not on file  Food Insecurity: Not on file  Transportation Needs: Not on file  Physical Activity: Not on file  Stress: Not on file  Social Connections: Not on file    Tobacco Counseling Counseling given: Not Answered   Clinical Intake:  Pre-visit  preparation completed: Yes  Pain : No/denies pain     BMI - recorded: 38 Nutritional Risks: None Diabetes: No  How often do you need to have someone help you when you read instructions, pamphlets, or other written materials from your doctor or pharmacy?: 1 - Never  Diabetic?yes         Activities of Daily Living In your present state of health, do you have any difficulty performing the following activities: 05/04/2021 09/07/2020  Hearing? Tempie Donning  Vision? N N  Difficulty concentrating or making decisions? N N  Walking or climbing stairs? N N  Dressing or bathing? N N  Doing errands, shopping? N N  Preparing  Food and eating ? N -  Using the Toilet? N -  In the past six months, have you accidently leaked urine? Y -  Do you have problems with loss of bowel control? N -  Managing your Medications? N -  Managing your Finances? N -  Housekeeping or managing your Housekeeping? N -  Some recent data might be hidden    Patient Care Team: Lauree Chandler, NP as PCP - General (Geriatric Medicine) Minus Breeding, MD as PCP - Cardiology (Cardiology) Alanda Slim Neena Rhymes, MD as Consulting Physician (Ophthalmology)  Indicate any recent Medical Services you may have received from other than [facility] providers in the past year (date may be approximate).     Assessment:   This is a routine wellness examination for [place].  Hearing/Vision screen Hearing Screening - Comments:: Patient has some hearing problems,but doesn't wear hearing aids Vision Screening - Comments:: Patient wears glasses. Patient had eye exam May 2022. Patient sees Dr. Alanda Slim.  Dietary issues and exercise activities discussed: Current Exercise Habits: The patient does not participate in regular exercise at present   Goals Addressed   None    Depression Screen PHQ 2/9 Scores 05/04/2021 04/22/2020 01/24/2020 10/23/2019 04/22/2019 10/23/2018 04/18/2018  PHQ - 2 Score 0 0 0 0 0 0 0    Fall Risk Fall Risk   05/04/2021 05/28/2020 04/22/2020 01/24/2020 10/23/2019  Falls in the past year? 1 0 0 0 0  Comment - - - - -  Number falls in past yr: 1 0 0 0 0  Injury with Fall? 0 0 0 0 0  Risk for fall due to : History of fall(s) - - - -  Follow up Falls evaluation completed - - - -    FALL RISK PREVENTION PERTAINING TO THE HOME:  Any stairs in or around the home? Yes  If so, are there any without handrails? Yes  Home free of loose throw rugs in walkways, pet beds, electrical cords, etc? Yes  Adequate lighting in your home to reduce risk of falls? Yes   ASSISTIVE DEVICES UTILIZED TO PREVENT FALLS:  Life alert? No  Use of a cane, walker or w/c? No  Grab bars in the bathroom? No  Shower chair or bench in shower? No  Elevated toilet seat or a handicapped toilet? Yes   TIMED UP AND GO:  Was the test performed? No .    Cognitive Function: MMSE - Mini Mental State Exam 04/22/2019 04/13/2017 03/18/2016 01/29/2015  Not completed: Refused - - -  Orientation to time 5 5 5 5   Orientation to Place 5 5 5 5   Registration 3 3 3 3   Attention/ Calculation 5 5 5 5   Recall 3 3 3 1   Language- name 2 objects 2 2 2 2   Language- repeat 1 1 1 1   Language- follow 3 step command 3 3 3 3   Language- read & follow direction 1 1 1 1   Write a sentence 1 1 1 1   Copy design 1 1 1 1   Total score 30 30 30 28      6CIT Screen 05/04/2021 04/22/2020  What Year? 0 points 0 points  What month? 0 points 0 points  What time? 0 points 0 points  Count back from 20 0 points 0 points  Months in reverse 0 points 0 points  Repeat phrase 2 points 0 points  Total Score 2 0    Immunizations Immunization History  Administered Date(s) Administered   Fluad Quad(high Dose 65+) 02/05/2019,  03/06/2020, 01/29/2021   Influenza Whole 01/26/2013   Influenza, High Dose Seasonal PF 02/21/2018   Influenza-Unspecified 01/28/2014, 02/18/2016, 03/01/2017   PFIZER(Purple Top)SARS-COV-2 Vaccination 05/31/2019, 06/21/2019, 02/04/2020,  08/07/2020   Pneumococcal Conjugate-13 12/17/2013   Pneumococcal Polysaccharide-23 04/09/2013, 04/18/2018   Tdap 08/17/2009, 10/24/2019   Zoster Recombinat (Shingrix) 04/18/2018, 06/25/2018   Zoster, Live 01/09/2011    TDAP status: Up to date  Flu Vaccine status: Up to date  Pneumococcal vaccine status: Up to date  Covid-19 vaccine status: Information provided on how to obtain vaccines.   Qualifies for Shingles Vaccine? Yes   Zostavax completed Yes   Shingrix Completed?: Yes  Screening Tests Health Maintenance  Topic Date Due   OPHTHALMOLOGY EXAM  09/01/2020   COVID-19 Vaccine (5 - Booster for Pfizer series) 10/02/2020   FOOT EXAM  07/24/2021   HEMOGLOBIN A1C  07/26/2021   MAMMOGRAM  02/09/2023   COLONOSCOPY (Pts 45-69yrs Insurance coverage will need to be confirmed)  06/16/2025   TETANUS/TDAP  10/23/2029   Pneumonia Vaccine 48+ Years old  Completed   INFLUENZA VACCINE  Completed   DEXA SCAN  Completed   Hepatitis C Screening  Completed   Zoster Vaccines- Shingrix  Completed   HPV VACCINES  Aged Out    Health Maintenance  Health Maintenance Due  Topic Date Due   OPHTHALMOLOGY EXAM  09/01/2020   COVID-19 Vaccine (5 - Booster for [place] series) 10/02/2020    Colorectal cancer screening: Type of screening: Colonoscopy. Completed 2/22. Repeat every 5 years  Mammogram status: Completed 02/08/21. Repeat every year  Bone Density status: Completed 07/19/19. Results reflect: Bone density results: OSTEOPENIA. Repeat every 2 years.  Lung Cancer Screening: (Low Dose CT Chest recommended if Age 75-80 years, 30 pack-year currently smoking OR have quit w/in 15years.) does not qualify.   Lung Cancer Screening Referral: no  Additional Screening:  Hepatitis C Screening: does qualify; Completed 2016  Vision Screening: Recommended annual ophthalmology exams for early detection of glaucoma and other disorders of the eye. Is the patient up to date with their annual eye exam?  Yes   Who is the provider or what is the name of the office in which the patient attends annual eye exams? [facility] If pt is not established with a provider, would they like to be referred to a provider to establish care? No .   Dental Screening: Recommended annual dental exams for proper oral hygiene  Community Resource Referral / Chronic Care Management: CRR required this visit?  No   CCM required this visit?  No      Plan:     I have personally reviewed and noted the following in the patients chart:   Medical and social history Use of alcohol, tobacco or illicit drugs  Current medications and supplements including opioid prescriptions.  Functional ability and status Nutritional status Physical activity Advanced directives List of other physicians Hospitalizations, surgeries, and ER visits in previous 12 months Vitals Screenings to include cognitive, depression, and falls Referrals and appointments  In addition, I have reviewed and discussed with patient certain preventive protocols, quality metrics, and best practice recommendations. A written personalized care plan for preventive services as well as general preventive health recommendations were provided to patient.     Lauree Chandler, NP   05/04/2021    Virtual Visit via Telephone Note  I connected withNAME@ on 05/04/21 at  4:15 PM EST by telephone and verified that I am speaking with the correct person using two identifiers.  Location: Patient:  home Provider: [facility]   I discussed the limitations, risks, security and privacy concerns of performing an evaluation and management service by telephone and the availability of in person appointments. I also discussed with the patient that there may be a patient responsible charge related to this service. The patient expressed understanding and agreed to proceed.   I discussed the assessment and treatment plan with the patient. The patient was provided an opportunity to ask  questions and all were answered. The patient agreed with the plan and demonstrated an understanding of the instructions.   The patient was advised to call back or seek an in-person evaluation if the symptoms worsen or if the condition fails to improve as anticipated.  I provided 15 minutes of non-face-to-face time during this encounter.  Carlos American. Harle Battiest Avs printed and mailed

## 2021-05-04 NOTE — Progress Notes (Signed)
This service is provided via telemedicine  No vital signs collected/recorded due to the encounter was a telemedicine visit.   Location of patient (ex: home, work):  Home  Patient consents to a telephone visit:  Yes, see encounter dated 05/04/2021  Location of the provider (ex: office, home):  [facility]  Name of any referring provider:  N/A  Names of all persons participating in the telemedicine service and their role in the encounter:  Sherrie Mustache, Nurse Practitioner, Carroll Kinds, CMA, and patient.   Time spent on call:  10 minutes with medical assistant

## 2021-05-04 NOTE — Patient Instructions (Signed)
Jill Garza , Thank you for taking time to come for your Medicare Wellness Visit. I appreciate your ongoing commitment to your health goals. Please review the following plan we discussed and let me know if I can assist you in the future.   Screening recommendations/referrals: Colonoscopy up to date Mammogram up to date Bone Density up to date Recommended yearly ophthalmology/optometry visit for glaucoma screening and checkup Recommended yearly dental visit for hygiene and checkup  Vaccinations: Influenza vaccine up to date Pneumococcal vaccine up to date Tdap vaccine up to date Shingles vaccine up to date    Advanced directives: recommended to bring to office to place on file.   Conditions/risks identified: advanced age, obesity, htn, diabetes, hyperlipidemia.  Next appointment: yearly for awv   Preventive Care 42 Years and Older, Female Preventive care refers to lifestyle choices and visits with your health care provider that can promote health and wellness. What does preventive care include? A yearly physical exam. This is also called an annual well check. Dental exams once or twice a year. Routine eye exams. Ask your health care provider how often you should have your eyes checked. Personal lifestyle choices, including: Daily care of your teeth and gums. Regular physical activity. Eating a healthy diet. Avoiding tobacco and drug use. Limiting alcohol use. Practicing safe sex. Taking low-dose aspirin every day. Taking vitamin and mineral supplements as recommended by your health care provider. What happens during an annual well check? The services and screenings done by your health care provider during your annual well check will depend on your age, overall health, lifestyle risk factors, and family history of disease. Counseling  Your health care provider may ask you questions about your: Alcohol use. Tobacco use. Drug use. Emotional well-being. Home and relationship  well-being. Sexual activity. Eating habits. History of falls. Memory and ability to understand (cognition). Work and work Statistician. Reproductive health. Screening  You may have the following tests or measurements: Height, weight, and BMI. Blood pressure. Lipid and cholesterol levels. These may be checked every 5 years, or more frequently if you are over 24 years old. Skin check. Lung cancer screening. You may have this screening every year starting at age 79 if you have a 30-pack-year history of smoking and currently smoke or have quit within the past 15 years. Fecal occult blood test (FOBT) of the stool. You may have this test every year starting at age 66. Flexible sigmoidoscopy or colonoscopy. You may have a sigmoidoscopy every 5 years or a colonoscopy every 10 years starting at age 61. Hepatitis C blood test. Hepatitis B blood test. Sexually transmitted disease (STD) testing. Diabetes screening. This is done by checking your blood sugar (glucose) after you have not eaten for a while (fasting). You may have this done every 1-3 years. Bone density scan. This is done to screen for osteoporosis. You may have this done starting at age 42. Mammogram. This may be done every 1-2 years. Talk to your health care provider about how often you should have regular mammograms. Talk with your health care provider about your test results, treatment options, and if necessary, the need for more tests. Vaccines  Your health care provider may recommend certain vaccines, such as: Influenza vaccine. This is recommended every year. Tetanus, diphtheria, and acellular pertussis (Tdap, Td) vaccine. You may need a Td booster every 10 years. Zoster vaccine. You may need this after age 40. Pneumococcal 13-valent conjugate (PCV13) vaccine. One dose is recommended after age 3. Pneumococcal polysaccharide (PPSV23) vaccine.  One dose is recommended after age 74. Talk to your health care provider about which  screenings and vaccines you need and how often you need them. This information is not intended to replace advice given to you by your health care provider. Make sure you discuss any questions you have with your health care provider. Document Released: 05/22/2015 Document Revised: 01/13/2016 Document Reviewed: 02/24/2015 Elsevier Interactive Patient Education  2017 [employer] Prevention in the Home Falls can cause injuries. They can happen to people of all ages. There are many things you can do to make your home safe and to help prevent falls. What can I do on the outside of my home? Regularly fix the edges of walkways and driveways and fix any cracks. Remove anything that might make you trip as you walk through a door, such as a raised step or threshold. Trim any bushes or trees on the path to your home. Use bright outdoor lighting. Clear any walking paths of anything that might make someone trip, such as rocks or tools. Regularly check to see if handrails are loose or broken. Make sure that both sides of any steps have handrails. Any raised decks and porches should have guardrails on the edges. Have any leaves, snow, or ice cleared regularly. Use sand or salt on walking paths during winter. Clean up any spills in your garage right away. This includes oil or grease spills. What can I do in the bathroom? Use night lights. Install grab bars by the toilet and in the tub and shower. Do not use towel bars as grab bars. Use non-skid mats or decals in the tub or shower. If you need to sit down in the shower, use a plastic, non-slip stool. Keep the floor dry. Clean up any water that spills on the floor as soon as it happens. Remove soap buildup in the tub or shower regularly. Attach bath mats securely with double-sided non-slip rug tape. Do not have throw rugs and other things on the floor that can make you trip. What can I do in the bedroom? Use night lights. Make sure that you have a  light by your bed that is easy to reach. Do not use any sheets or blankets that are too big for your bed. They should not hang down onto the floor. Have a firm chair that has side arms. You can use this for support while you get dressed. Do not have throw rugs and other things on the floor that can make you trip. What can I do in the kitchen? Clean up any spills right away. Avoid walking on wet floors. Keep items that you use a lot in easy-to-reach places. If you need to reach something above you, use a strong step stool that has a grab bar. Keep electrical cords out of the way. Do not use floor polish or wax that makes floors slippery. If you must use wax, use non-skid floor wax. Do not have throw rugs and other things on the floor that can make you trip. What can I do with my stairs? Do not leave any items on the stairs. Make sure that there are handrails on both sides of the stairs and use them. Fix handrails that are broken or loose. Make sure that handrails are as long as the stairways. Check any carpeting to make sure that it is firmly attached to the stairs. Fix any carpet that is loose or worn. Avoid having throw rugs at the top or bottom of  the stairs. If you do have throw rugs, attach them to the floor with carpet tape. Make sure that you have a light switch at the top of the stairs and the bottom of the stairs. If you do not have them, ask someone to add them for you. What else can I do to help prevent falls? Wear shoes that: Do not have high heels. Have rubber bottoms. Are comfortable and fit you well. Are closed at the toe. Do not wear sandals. If you use a stepladder: Make sure that it is fully opened. Do not climb a closed stepladder. Make sure that both sides of the stepladder are locked into place. Ask someone to hold it for you, if possible. Clearly mark and make sure that you can see: Any grab bars or handrails. First and last steps. Where the edge of each step  is. Use tools that help you move around (mobility aids) if they are needed. These include: Canes. Walkers. Scooters. Crutches. Turn on the lights when you go into a dark area. Replace any light bulbs as soon as they burn out. Set up your furniture so you have a clear path. Avoid moving your furniture around. If any of your floors are uneven, fix them. If there are any pets around you, be aware of where they are. Review your medicines with your doctor. Some medicines can make you feel dizzy. This can increase your chance of falling. Ask your doctor what other things that you can do to help prevent falls. This information is not intended to replace advice given to you by your health care provider. Make sure you discuss any questions you have with your health care provider. Document Released: 02/19/2009 Document Revised: 10/01/2015 Document Reviewed: 05/30/2014 Elsevier Interactive Patient Education  2017 [employer].

## 2021-05-06 ENCOUNTER — Encounter: Payer: Medicare Other | Admitting: Nurse Practitioner

## 2021-06-01 ENCOUNTER — Other Ambulatory Visit: Payer: Self-pay | Admitting: Nurse Practitioner

## 2021-06-01 DIAGNOSIS — E785 Hyperlipidemia, unspecified: Secondary | ICD-10-CM

## 2021-07-10 ENCOUNTER — Other Ambulatory Visit: Payer: Self-pay | Admitting: Family

## 2021-07-10 ENCOUNTER — Other Ambulatory Visit: Payer: Self-pay | Admitting: Nurse Practitioner

## 2021-07-10 DIAGNOSIS — E119 Type 2 diabetes mellitus without complications: Secondary | ICD-10-CM

## 2021-07-10 DIAGNOSIS — I10 Essential (primary) hypertension: Secondary | ICD-10-CM

## 2021-07-30 ENCOUNTER — Other Ambulatory Visit: Payer: Self-pay | Admitting: Nurse Practitioner

## 2021-07-30 DIAGNOSIS — E785 Hyperlipidemia, unspecified: Secondary | ICD-10-CM

## 2021-07-30 DIAGNOSIS — E1140 Type 2 diabetes mellitus with diabetic neuropathy, unspecified: Secondary | ICD-10-CM

## 2021-07-30 DIAGNOSIS — I10 Essential (primary) hypertension: Secondary | ICD-10-CM

## 2021-08-03 ENCOUNTER — Other Ambulatory Visit: Payer: Medicare Other

## 2021-08-03 ENCOUNTER — Other Ambulatory Visit: Payer: Self-pay

## 2021-08-03 DIAGNOSIS — E785 Hyperlipidemia, unspecified: Secondary | ICD-10-CM

## 2021-08-03 DIAGNOSIS — I10 Essential (primary) hypertension: Secondary | ICD-10-CM | POA: Diagnosis not present

## 2021-08-03 DIAGNOSIS — E210 Primary hyperparathyroidism: Secondary | ICD-10-CM | POA: Diagnosis not present

## 2021-08-03 DIAGNOSIS — E1140 Type 2 diabetes mellitus with diabetic neuropathy, unspecified: Secondary | ICD-10-CM | POA: Diagnosis not present

## 2021-08-05 ENCOUNTER — Encounter: Payer: Self-pay | Admitting: Nurse Practitioner

## 2021-08-06 ENCOUNTER — Ambulatory Visit (INDEPENDENT_AMBULATORY_CARE_PROVIDER_SITE_OTHER): Payer: Medicare Other | Admitting: Nurse Practitioner

## 2021-08-06 ENCOUNTER — Encounter: Payer: Self-pay | Admitting: Nurse Practitioner

## 2021-08-06 VITALS — BP 126/70 | HR 74 | Temp 97.1°F | Ht 64.0 in | Wt 214.0 lb

## 2021-08-06 DIAGNOSIS — E210 Primary hyperparathyroidism: Secondary | ICD-10-CM

## 2021-08-06 DIAGNOSIS — E1140 Type 2 diabetes mellitus with diabetic neuropathy, unspecified: Secondary | ICD-10-CM

## 2021-08-06 DIAGNOSIS — N183 Chronic kidney disease, stage 3 unspecified: Secondary | ICD-10-CM

## 2021-08-06 DIAGNOSIS — E785 Hyperlipidemia, unspecified: Secondary | ICD-10-CM

## 2021-08-06 DIAGNOSIS — K219 Gastro-esophageal reflux disease without esophagitis: Secondary | ICD-10-CM | POA: Diagnosis not present

## 2021-08-06 DIAGNOSIS — E1122 Type 2 diabetes mellitus with diabetic chronic kidney disease: Secondary | ICD-10-CM | POA: Diagnosis not present

## 2021-08-06 DIAGNOSIS — I10 Essential (primary) hypertension: Secondary | ICD-10-CM

## 2021-08-06 DIAGNOSIS — F339 Major depressive disorder, recurrent, unspecified: Secondary | ICD-10-CM | POA: Diagnosis not present

## 2021-08-06 NOTE — Patient Instructions (Signed)
Start taking glipizide 5 mg with lunch and dinner (since you do not eat breakfast) ? ? ?

## 2021-08-06 NOTE — Progress Notes (Signed)
? ? ?Careteam: ?Patient Care Team: ?Lauree Chandler, NP as PCP - General (Geriatric Medicine) ?Minus Breeding, MD as PCP - Cardiology (Cardiology) ?Awanda Mink, MD as Consulting Physician (Ophthalmology) ? ?PLACE OF SERVICE:  ?[facility]  ?Advanced Directive information ?Does Patient Have a Medical Advance Directive?: Yes, Type of Advance Directive: [place];Living will, Does patient want to make changes to medical advance directive?: No - Patient declined ? ?Allergies  ?Allergen Reactions  ? Penicillins   ?  Rash   ? Ivp Dye [Iodinated Contrast Media]   ?  Hives   ? Percocet [Oxycodone-Acetaminophen]   ?  Itching   ? ? ?Chief Complaint  ?Patient presents with  ? Medical Management of Chronic Issues  ?  6 month follow-up and discuss labs (active on mychart). Foot exam today. Discuss need for eye exam or post pone if patient refuses.   ? ? ? ?HPI: Patient is a 71 y.o. female follow up.  ? ?HTN- Controlled with Amlodipine and Losartan, no issues or concerns.  ? ?GERD- controlled with Omeprazole 20 mg QD. Avoids spicy foods. No issues or concerns.  ? ?Depression- controlled with Paxil '30mg'$  QD. No issues or concerns ? ?CKD- lab results has worsen, and patient is concerned about what can be done. Does not take NSAIDs, stays well hydrated.  ? ?DTM - has lost 10 lbs over the past 3 months.  ?Has been taking Glipizide 5 mg BID in the evenings.  ?States she has night sweats every night for a long time. ?Does not check CBG at home. Last meal is usually around 6pm or earlier. Skips breakfast but drinks coffee. Consumes lunch and dinner.  ? ?Diet: Decreased carbohydrates, eats more veggies, baked or stir fry chicken, and hydrates with water and ice tea, Proto coffee in the morning.  ? ?Up to date with eye exam, last seen May of 2022. ? ?Review of Systems:  ?Review of Systems  ?Constitutional:  Positive for diaphoresis. Negative for chills and fever.  ?     Wakes up trenched in sweats. Usually  takes both glipizide in the evening .   ?Respiratory:  Positive for cough. Negative for shortness of breath and wheezing.   ?     Chronic post nasal drainage - allergies.    ?Cardiovascular:  Positive for palpitations. Negative for chest pain, orthopnea and leg swelling.  ?     Occasional  tachycardia- seen by cards a few years ago   ?Gastrointestinal:  Negative for abdominal pain, constipation, diarrhea, heartburn, nausea and vomiting.  ?Genitourinary:  Positive for frequency. Negative for dysuria and urgency.  ?     Gets up about 4 times at night to urinate. And small amounts. Goes to bathroom every 1.5 hr during day.   ?Musculoskeletal:  Negative for back pain, falls, joint pain and myalgias.  ?Psychiatric/Behavioral:  Negative for depression. The patient does not have insomnia.   ? ?Past Medical History:  ?Diagnosis Date  ? Arthritis   ? Cancer [facility])   ? melanoma- right leg  ? Chronic kidney disease   ? Depression   ? GERD (gastroesophageal reflux disease)   ? pt denies   ? History of hiatal hernia   ? Hypercholesterolemia   ? Hyperparathyroidism ([place])   ? Hypertension   ? Sweat, sweating, excessive   ? Type 2 diabetes mellitus ([place])   ? ?Past Surgical History:  ?Procedure Laterality Date  ? ABDOMINAL HYSTERECTOMY  1979  ? APPENDECTOMY  1964  ?  BREAST EXCISIONAL BIOPSY Left 08/2014  ? benign  ? BREAST LUMPECTOMY WITH RADIOACTIVE SEED LOCALIZATION Left 12/31/2014  ? Procedure: LEFT BREAST LUMPECTOMY WITH RADIOACTIVE SEED LOCALIZATION;  Surgeon: Donnie Mesa, MD;  Location: [place];  Service: General;  Laterality: Left;  ? CARPAL TUNNEL RELEASE Right 1977  ? CARPAL TUNNEL RELEASE Left 2007  ? CATARACT EXTRACTION Left 04/13/2014  ? Dr. Herbert Deaner  ? CATARACT EXTRACTION Right 06/10/2014  ? Dr. Herbert Deaner  ? CHOLECYSTECTOMY N/A 07/14/2017  ? Procedure: LAPAROSCOPIC CHOLECYSTECTOMY;  Surgeon: Ileana Roup, MD;  Location: [place];  Service: General;  Laterality: N/A;  ? COLONOSCOPY  2009  ? JOINT  REPLACEMENT    ? MELANOMA EXCISION    ? PARATHYROIDECTOMY N/A 09/10/2020  ? Procedure: PARATHYROIDECTOMY;  Surgeon: Armandina Gemma, MD;  Location: [facility];  Service: General;  Laterality: N/A;  ? THYROID EXPLORATION N/A 09/10/2020  ? Procedure: POSSIBLE NECK EXPLORATION;  Surgeon: Armandina Gemma, MD;  Location: [facility];  Service: General;  Laterality: N/A;  ? TONSILLECTOMY  1962  ? TOTAL KNEE ARTHROPLASTY Left 2012  ? TOTAL KNEE ARTHROPLASTY Right 2008  ? ?Social History: ?  reports that she quit smoking about 35 years ago. Her smoking use included cigarettes. She has a 15.00 pack-year smoking history. She has never used smokeless tobacco. She reports current alcohol use. She reports that she does not use drugs. ? ?Family History  ?Problem Relation Age of Onset  ? Pulmonary embolism Mother   ?     Multiple, B/L   ? Heart attack Father   ?     Died of MI age 79  ? Hypertension Sister   ? Diabetes type II Sister   ? High Cholesterol Sister   ? Deep vein thrombosis Sister   ? Hypertension Brother   ? Heart disease Brother 29  ? Diabetes Brother   ? High Cholesterol Brother   ? Hypertension Brother   ? High Cholesterol Brother   ? Alcohol abuse Brother   ? Deep vein thrombosis Brother   ? Atrial fibrillation Brother   ? Hepatitis Brother   ? Cancer Maternal Grandmother 61  ? Reye's syndrome Daughter   ? Stroke Other   ? Colon cancer Neg Hx   ? Esophageal cancer Neg Hx   ? Stomach cancer Neg Hx   ? Rectal cancer Neg Hx   ? ? ?Medications: ?Patient's Medications  ?New Prescriptions  ? No medications on file  ?Previous Medications  ? AMLODIPINE (NORVASC) 5 MG TABLET    Take 1 tablet by mouth once daily  ? CLINDAMYCIN (CLEOCIN) 300 MG CAPSULE    4 tablets prior to dental work  ? FENOFIBRATE (TRICOR) 145 MG TABLET    Take 1 tablet by mouth once daily  ? GLIPIZIDE (GLUCOTROL) 5 MG TABLET    Take 10 mg by mouth daily before breakfast.  ? LOSARTAN (COZAAR) 100 MG TABLET    Take 1 tablet by mouth once daily  ? OMEPRAZOLE (PRILOSEC) 20  MG CAPSULE    Take 1 capsule by mouth once daily  ? PAROXETINE (PAXIL) 30 MG TABLET    Take 1 tablet by mouth once daily  ? ROSUVASTATIN (CRESTOR) 5 MG TABLET    TAKE 1 TABLET BY MOUTH THREE TIMES A WEEK  ?Modified Medications  ? No medications on file  ?Discontinued Medications  ? GLIPIZIDE (GLUCOTROL) 5 MG TABLET    TAKE 1 TABLET BY MOUTH TWICE DAILY BEFORE MEAL(S)  ? ? ?Physical Exam: ? ?  Vitals:  ? 08/06/21 0956  ?BP: 126/70  ?Pulse: 74  ?Temp: (!) 97.1 ?F (36.2 ?C)  ?TempSrc: Temporal  ?Weight: 214 lb (97.1 kg)  ?Height: '5\' 4"'$  (1.626 m)  ? ?Body mass index is 36.73 kg/m?. ?Wt Readings from Last 3 Encounters:  ?08/06/21 214 lb (97.1 kg)  ?01/29/21 224 lb (101.6 kg)  ?09/24/20 224 lb 2 oz (101.7 kg)  ? ? ?Physical Exam ?Constitutional:   ?   Appearance: She is obese. She is not ill-appearing.  ?Cardiovascular:  ?   Rate and Rhythm: Normal rate and regular rhythm.  ?   Pulses: Normal pulses.  ?   Heart sounds: Normal heart sounds.  ?Pulmonary:  ?   Effort: Pulmonary effort is normal.  ?   Breath sounds: Normal breath sounds.  ?Abdominal:  ?   General: Bowel sounds are normal.  ?   Palpations: Abdomen is soft.  ?Neurological:  ?   Mental Status: She is alert and oriented to person, place, and time.  ?Psychiatric:     ?   Mood and Affect: Mood normal.     ?   Behavior: Behavior normal.     ?   Thought Content: Thought content normal.     ?   Judgment: Judgment normal.  ? ? ?Labs reviewed: ?Basic Metabolic Panel: ?Recent Labs  ?  09/24/20 ?1055 01/26/21 ?1165 08/03/21 ?7903  ?NA 137 137 139  ?K 4.3 4.2 4.5  ?CL 103 104 103  ?CO2 '26 23 25  '$ ?GLUCOSE 130* 141* 128*  ?BUN 23 25 26*  ?CREATININE 1.44* 1.36* 1.69*  ?CALCIUM 9.7 9.8 10.1  ? ?Liver Function Tests: ?Recent Labs  ?  09/24/20 ?1055 01/26/21 ?8333 08/03/21 ?8329  ?AST '21 18 18  '$ ?ALT '20 17 15  '$ ?ALKPHOS 90  --   --   ?BILITOT 0.4 0.5 0.5  ?PROT 7.4 6.8 7.3  ?ALBUMIN 4.4  --   --   ? ?No results for input(s): LIPASE, AMYLASE in the last 8760 hours. ?No results for  input(s): AMMONIA in the last 8760 hours. ?CBC: ?Recent Labs  ?  09/07/20 ?1131 01/26/21 ?0824 08/03/21 ?0821  ?WBC 5.6 6.6 5.9  ?NEUTROABS  --  1,916 6,060  ?HGB 12.9 13.5 14.2  ?HCT 41.3 41.9 44.2  ?MCV

## 2021-08-08 LAB — COMPLETE METABOLIC PANEL WITH GFR
AG Ratio: 1.7 (calc) (ref 1.0–2.5)
ALT: 15 U/L (ref 6–29)
AST: 18 U/L (ref 10–35)
Albumin: 4.6 g/dL (ref 3.6–5.1)
Alkaline phosphatase (APISO): 39 U/L (ref 37–153)
BUN/Creatinine Ratio: 15 (calc) (ref 6–22)
BUN: 26 mg/dL — ABNORMAL HIGH (ref 7–25)
CO2: 25 mmol/L (ref 20–32)
Calcium: 10.1 mg/dL (ref 8.6–10.4)
Chloride: 103 mmol/L (ref 98–110)
Creat: 1.69 mg/dL — ABNORMAL HIGH (ref 0.60–1.00)
Globulin: 2.7 g/dL (calc) (ref 1.9–3.7)
Glucose, Bld: 128 mg/dL — ABNORMAL HIGH (ref 65–99)
Potassium: 4.5 mmol/L (ref 3.5–5.3)
Sodium: 139 mmol/L (ref 135–146)
Total Bilirubin: 0.5 mg/dL (ref 0.2–1.2)
Total Protein: 7.3 g/dL (ref 6.1–8.1)
eGFR: 32 mL/min/{1.73_m2} — ABNORMAL LOW (ref >=60)

## 2021-08-08 LAB — CBC WITH DIFFERENTIAL/PLATELET
Absolute Monocytes: 301 cells/uL (ref 200–950)
Basophils Absolute: 59 cells/uL (ref 0–200)
Basophils Relative: 1 %
Eosinophils Absolute: 77 cells/uL (ref 15–500)
Eosinophils Relative: 1.3 %
HCT: 44.2 % (ref 35.0–45.0)
Hemoglobin: 14.2 g/dL (ref 11.7–15.5)
Lymphs Abs: 2000 cells/uL (ref 850–3900)
MCH: 28.3 pg (ref 27.0–33.0)
MCHC: 32.1 g/dL (ref 32.0–36.0)
MCV: 88 fL (ref 80.0–100.0)
MPV: 10 fL (ref 7.5–12.5)
Monocytes Relative: 5.1 %
Neutro Abs: 3463 cells/uL (ref 1500–7800)
Neutrophils Relative %: 58.7 %
Platelets: 281 10*3/uL (ref 140–400)
RBC: 5.02 10*6/uL (ref 3.80–5.10)
RDW: 12.8 % (ref 11.0–15.0)
Total Lymphocyte: 33.9 %
WBC: 5.9 10*3/uL (ref 3.8–10.8)

## 2021-08-08 LAB — LIPID PANEL
Cholesterol: 121 mg/dL (ref <200)
HDL: 44 mg/dL — ABNORMAL LOW (ref >=50)
LDL Cholesterol (Calc): 55 mg/dL (calc)
Non-HDL Cholesterol (Calc): 77 mg/dL (calc) (ref <130)
Total CHOL/HDL Ratio: 2.8 (calc) (ref <5.0)
Triglycerides: 141 mg/dL (ref <150)

## 2021-08-08 LAB — HEMOGLOBIN A1C
Hgb A1c MFr Bld: 6.5 % of total Hgb — ABNORMAL HIGH (ref <5.7)
Mean Plasma Glucose: 140 mg/dL
eAG (mmol/L): 7.7 mmol/L

## 2021-08-08 LAB — TSH: TSH: 2.03 mIU/L (ref 0.40–4.50)

## 2021-08-13 DIAGNOSIS — Z20822 Contact with and (suspected) exposure to covid-19: Secondary | ICD-10-CM | POA: Diagnosis not present

## 2021-08-19 ENCOUNTER — Encounter: Payer: Self-pay | Admitting: Nurse Practitioner

## 2021-08-24 NOTE — Telephone Encounter (Signed)
Kathyrn Lass K  You 55 minutes ago (11:04 AM)  ? ?LM ?Done, spoke with pt & provided # to [facility] for scheduling  ?Thanks, Lattie Haw   ? ?

## 2021-08-27 DIAGNOSIS — Z20822 Contact with and (suspected) exposure to covid-19: Secondary | ICD-10-CM | POA: Diagnosis not present

## 2021-09-06 DIAGNOSIS — Z20822 Contact with and (suspected) exposure to covid-19: Secondary | ICD-10-CM | POA: Diagnosis not present

## 2021-09-11 DIAGNOSIS — Z20822 Contact with and (suspected) exposure to covid-19: Secondary | ICD-10-CM | POA: Diagnosis not present

## 2021-09-16 ENCOUNTER — Other Ambulatory Visit: Payer: Self-pay | Admitting: Nurse Practitioner

## 2021-09-16 DIAGNOSIS — E1140 Type 2 diabetes mellitus with diabetic neuropathy, unspecified: Secondary | ICD-10-CM

## 2021-09-21 DIAGNOSIS — N1832 Chronic kidney disease, stage 3b: Secondary | ICD-10-CM | POA: Diagnosis not present

## 2021-09-21 DIAGNOSIS — E1122 Type 2 diabetes mellitus with diabetic chronic kidney disease: Secondary | ICD-10-CM | POA: Diagnosis not present

## 2021-09-21 DIAGNOSIS — I129 Hypertensive chronic kidney disease with stage 1 through stage 4 chronic kidney disease, or unspecified chronic kidney disease: Secondary | ICD-10-CM | POA: Diagnosis not present

## 2021-09-21 DIAGNOSIS — E213 Hyperparathyroidism, unspecified: Secondary | ICD-10-CM | POA: Diagnosis not present

## 2021-09-21 DIAGNOSIS — N390 Urinary tract infection, site not specified: Secondary | ICD-10-CM | POA: Diagnosis not present

## 2021-09-21 DIAGNOSIS — D649 Anemia, unspecified: Secondary | ICD-10-CM | POA: Diagnosis not present

## 2021-09-23 ENCOUNTER — Other Ambulatory Visit: Payer: Self-pay | Admitting: Nephrology

## 2021-09-23 DIAGNOSIS — D649 Anemia, unspecified: Secondary | ICD-10-CM

## 2021-09-23 DIAGNOSIS — E213 Hyperparathyroidism, unspecified: Secondary | ICD-10-CM

## 2021-09-23 DIAGNOSIS — N1832 Chronic kidney disease, stage 3b: Secondary | ICD-10-CM

## 2021-09-24 ENCOUNTER — Ambulatory Visit
Admission: RE | Admit: 2021-09-24 | Discharge: 2021-09-24 | Disposition: A | Discharge status: Home / Self Care | Payer: Medicare Other | Source: Ambulatory Visit | Attending: Nephrology | Admitting: Nephrology

## 2021-09-24 DIAGNOSIS — N1832 Chronic kidney disease, stage 3b: Secondary | ICD-10-CM | POA: Diagnosis not present

## 2021-09-24 DIAGNOSIS — D649 Anemia, unspecified: Secondary | ICD-10-CM

## 2021-09-24 DIAGNOSIS — E213 Hyperparathyroidism, unspecified: Secondary | ICD-10-CM

## 2021-10-07 ENCOUNTER — Other Ambulatory Visit: Payer: Self-pay | Admitting: Nurse Practitioner

## 2021-10-26 ENCOUNTER — Other Ambulatory Visit: Payer: Self-pay | Admitting: Nurse Practitioner

## 2021-10-26 DIAGNOSIS — N390 Urinary tract infection, site not specified: Secondary | ICD-10-CM | POA: Diagnosis not present

## 2021-10-26 DIAGNOSIS — K219 Gastro-esophageal reflux disease without esophagitis: Secondary | ICD-10-CM

## 2021-11-01 ENCOUNTER — Other Ambulatory Visit: Payer: Self-pay | Admitting: Nurse Practitioner

## 2021-11-01 DIAGNOSIS — E785 Hyperlipidemia, unspecified: Secondary | ICD-10-CM

## 2021-11-01 DIAGNOSIS — I10 Essential (primary) hypertension: Secondary | ICD-10-CM

## 2021-12-06 ENCOUNTER — Other Ambulatory Visit: Payer: Self-pay | Admitting: Nurse Practitioner

## 2021-12-06 DIAGNOSIS — E785 Hyperlipidemia, unspecified: Secondary | ICD-10-CM

## 2021-12-27 ENCOUNTER — Other Ambulatory Visit: Payer: Self-pay | Admitting: Nurse Practitioner

## 2021-12-27 DIAGNOSIS — Z1231 Encounter for screening mammogram for malignant neoplasm of breast: Secondary | ICD-10-CM

## 2022-01-04 DIAGNOSIS — Z85820 Personal history of malignant melanoma of skin: Secondary | ICD-10-CM | POA: Diagnosis not present

## 2022-01-04 DIAGNOSIS — L309 Dermatitis, unspecified: Secondary | ICD-10-CM | POA: Diagnosis not present

## 2022-01-04 DIAGNOSIS — L821 Other seborrheic keratosis: Secondary | ICD-10-CM | POA: Diagnosis not present

## 2022-01-04 DIAGNOSIS — D225 Melanocytic nevi of trunk: Secondary | ICD-10-CM | POA: Diagnosis not present

## 2022-01-04 DIAGNOSIS — L814 Other melanin hyperpigmentation: Secondary | ICD-10-CM | POA: Diagnosis not present

## 2022-01-04 DIAGNOSIS — Z08 Encounter for follow-up examination after completed treatment for malignant neoplasm: Secondary | ICD-10-CM | POA: Diagnosis not present

## 2022-01-28 DIAGNOSIS — Z23 Encounter for immunization: Secondary | ICD-10-CM | POA: Diagnosis not present

## 2022-02-02 ENCOUNTER — Other Ambulatory Visit: Payer: Medicare Other

## 2022-02-02 DIAGNOSIS — E1140 Type 2 diabetes mellitus with diabetic neuropathy, unspecified: Secondary | ICD-10-CM

## 2022-02-03 ENCOUNTER — Telehealth: Payer: Self-pay | Admitting: *Deleted

## 2022-02-03 LAB — HEMOGLOBIN A1C
Hgb A1c MFr Bld: 6.5 % of total Hgb — ABNORMAL HIGH (ref <5.7)
Mean Plasma Glucose: 140 mg/dL
eAG (mmol/L): 7.7 mmol/L

## 2022-02-03 LAB — COMPLETE METABOLIC PANEL WITH GFR
AG Ratio: 1.5 (calc) (ref 1.0–2.5)
ALT: 17 U/L (ref 6–29)
AST: 18 U/L (ref 10–35)
Albumin: 4.3 g/dL (ref 3.6–5.1)
Alkaline phosphatase (APISO): 34 U/L — ABNORMAL LOW (ref 37–153)
BUN/Creatinine Ratio: 18 (calc) (ref 6–22)
BUN: 27 mg/dL — ABNORMAL HIGH (ref 7–25)
CO2: 25 mmol/L (ref 20–32)
Calcium: 9.9 mg/dL (ref 8.6–10.4)
Chloride: 103 mmol/L (ref 98–110)
Creat: 1.51 mg/dL — ABNORMAL HIGH (ref 0.60–1.00)
Globulin: 2.9 g/dL (calc) (ref 1.9–3.7)
Glucose, Bld: 133 mg/dL — ABNORMAL HIGH (ref 65–99)
Potassium: 5.1 mmol/L (ref 3.5–5.3)
Sodium: 139 mmol/L (ref 135–146)
Total Bilirubin: 0.4 mg/dL (ref 0.2–1.2)
Total Protein: 7.2 g/dL (ref 6.1–8.1)
eGFR: 36 mL/min/{1.73_m2} — ABNORMAL LOW (ref >=60)

## 2022-02-03 NOTE — Chronic Care Management (AMB) (Signed)
  Care Coordination   Note   02/03/2022 Name: Jill Garza MRN: [id number] DOB: [birth year]  [name] is a 71 y.o. year old female who sees Dewaine Oats, Carlos American, NP for primary care. I reached out to Trevor Mace Annunziato by phone today to offer care coordination services.  [name] was given information about Care Coordination services today including:   The Care Coordination services include support from the care team which includes your Nurse Coordinator, Clinical Social Worker, or Pharmacist.  The Care Coordination team is here to help remove barriers to the health concerns and goals most important to you. Care Coordination services are voluntary, and the patient may decline or stop services at any time by request to their care team member.   Care Coordination Consent Status: Patient agreed to services and verbal consent obtained.   Follow up plan:  Telephone appointment with care coordination team member scheduled for:  02/18/22  Encounter Outcome:  Pt. Scheduled  Manuel Garcia  Direct Dial: [phone]

## 2022-02-07 ENCOUNTER — Ambulatory Visit: Payer: Medicare Other | Admitting: Nurse Practitioner

## 2022-02-09 ENCOUNTER — Encounter: Payer: Self-pay | Admitting: Nurse Practitioner

## 2022-02-09 ENCOUNTER — Ambulatory Visit
Admission: RE | Admit: 2022-02-09 | Discharge: 2022-02-09 | Disposition: A | Discharge status: Home / Self Care | Payer: Medicare Other | Source: Ambulatory Visit | Attending: Nurse Practitioner | Admitting: Nurse Practitioner

## 2022-02-09 DIAGNOSIS — Z1231 Encounter for screening mammogram for malignant neoplasm of breast: Secondary | ICD-10-CM

## 2022-02-11 ENCOUNTER — Ambulatory Visit (INDEPENDENT_AMBULATORY_CARE_PROVIDER_SITE_OTHER): Payer: Medicare Other | Admitting: Nurse Practitioner

## 2022-02-11 ENCOUNTER — Encounter: Payer: Self-pay | Admitting: Nurse Practitioner

## 2022-02-11 VITALS — BP 134/82 | HR 84 | Temp 95.1°F | Ht 64.0 in | Wt 222.0 lb

## 2022-02-11 DIAGNOSIS — E1140 Type 2 diabetes mellitus with diabetic neuropathy, unspecified: Secondary | ICD-10-CM

## 2022-02-11 DIAGNOSIS — E785 Hyperlipidemia, unspecified: Secondary | ICD-10-CM

## 2022-02-11 DIAGNOSIS — F339 Major depressive disorder, recurrent, unspecified: Secondary | ICD-10-CM

## 2022-02-11 DIAGNOSIS — N1832 Chronic kidney disease, stage 3b: Secondary | ICD-10-CM | POA: Diagnosis not present

## 2022-02-11 DIAGNOSIS — E210 Primary hyperparathyroidism: Secondary | ICD-10-CM

## 2022-02-11 DIAGNOSIS — K219 Gastro-esophageal reflux disease without esophagitis: Secondary | ICD-10-CM

## 2022-02-11 DIAGNOSIS — I10 Essential (primary) hypertension: Secondary | ICD-10-CM

## 2022-02-11 DIAGNOSIS — Z23 Encounter for immunization: Secondary | ICD-10-CM | POA: Diagnosis not present

## 2022-02-11 NOTE — Progress Notes (Signed)
Careteam: Patient Care Team: Lauree Chandler, NP as PCP - General (Geriatric Medicine) Minus Breeding, MD as PCP - Cardiology (Cardiology) Alanda Slim, Neena Rhymes, MD as Consulting Physician (Ophthalmology) Rex Kras, Claudette Stapler, RN as [facility]  PLACE OF SERVICE:  [place] Directive information Does Patient Have a Medical Advance Directive?: Yes, Type of Advance Directive: [place], Does patient want to make changes to medical advance directive?: No - Patient declined  Allergies  Allergen Reactions   Penicillins     Rash    Ivp Dye [Iodinated Contrast [place] [Oxycodone-Acetaminophen]     Itching     Chief Complaint  Patient presents with   Medical Management of Chronic Issues    6 month follow-up and discuss labs. Discuss need for diabetic kidney evaluation, eye exam, and additional covid boosters. NCIR verified. Flu vaccine today.      HPI: Patient is a 71 y.o. female for routine follow up.   HTN- remains on norvasc and cozaar.   Depression- Stable, remains on paxil.   CKD- saw nephrologist in may. Has been avoiding dark colored sodas per nephrologist recommendation.  Due to go back to see her in November. Kidney function is still elevated but stable.   T2DM- A1C at goal. Follows a low carb diet. Eye exam scheduled for 10/10 2023.   GERD- stable.   C/o post nasal drip without headache, watery/itchy eyes, congestion. Discussed an OTC antihistamine but she would not like to add any more medications.   Review of Systems:  Review of Systems  Constitutional:  Negative for fever and weight loss.  HENT:  Negative for congestion, ear pain, hearing loss, sinus pain and sore throat.   Eyes:  Negative for blurred vision and double vision.  Cardiovascular:  Negative for chest pain and leg swelling.  Gastrointestinal:  Positive for heartburn. Negative for abdominal pain and constipation.   Genitourinary:  Negative for dysuria and hematuria.  Musculoskeletal:  Negative for back pain and myalgias.  Skin:  Negative for rash.  Neurological:  Negative for dizziness and headaches.  Psychiatric/Behavioral:  Negative for depression. The patient is not nervous/anxious.     Past Medical History:  Diagnosis Date   Arthritis    Cancer ([place])    melanoma- right leg   Chronic kidney disease    Depression    GERD (gastroesophageal reflux disease)    pt denies    History of hiatal hernia    Hypercholesterolemia    Hyperparathyroidism (HCC)    Hypertension    Sweat, sweating, excessive    Type 2 diabetes mellitus ([place])    Past Surgical History:  Procedure Laterality Date   ABDOMINAL HYSTERECTOMY  1979   APPENDECTOMY  1964   BREAST EXCISIONAL BIOPSY Left 08/2014   benign   BREAST LUMPECTOMY WITH RADIOACTIVE SEED LOCALIZATION Left 12/31/2014   Procedure: LEFT BREAST LUMPECTOMY WITH RADIOACTIVE SEED LOCALIZATION;  Surgeon: Donnie Mesa, MD;  Location: [facility];  Service: General;  Laterality: Left;   CARPAL TUNNEL RELEASE Right [place] Left 2007   CATARACT EXTRACTION Left 04/13/2014   Dr. Herbert Deaner   CATARACT EXTRACTION Right 06/10/2014   Dr. Herbert Deaner   CHOLECYSTECTOMY N/A 07/14/2017   Procedure: LAPAROSCOPIC CHOLECYSTECTOMY;  Surgeon: Ileana Roup, MD;  Location: [facility];  Service: General;  Laterality: N/A;   COLONOSCOPY  2009   JOINT REPLACEMENT  MELANOMA EXCISION     PARATHYROIDECTOMY N/A 09/10/2020   Procedure: PARATHYROIDECTOMY;  Surgeon: Armandina Gemma, MD;  Location: [facility];  Service: General;  Laterality: N/A;   THYROID EXPLORATION N/A 09/10/2020   Procedure: POSSIBLE NECK EXPLORATION;  Surgeon: Armandina Gemma, MD;  Location: [facility];  Service: General;  Laterality: N/A;   TONSILLECTOMY  1962   TOTAL KNEE ARTHROPLASTY Left 2012   TOTAL KNEE ARTHROPLASTY Right 2008   Social History:   reports that she quit smoking about 35  years ago. Her smoking use included cigarettes. She has a 15.00 pack-year smoking history. She has never used smokeless tobacco. She reports current alcohol use. She reports that she does not use drugs.  Family History  Problem Relation Age of Onset   Pulmonary embolism Mother        Multiple, B/L    Heart attack Father        Died of MI age 21   Hypertension Sister    Diabetes type II Sister    High Cholesterol Sister    Deep vein thrombosis Sister    Hypertension Brother    Heart disease Brother 74   Diabetes Brother    High Cholesterol Brother    Hypertension Brother    High Cholesterol Brother    Alcohol abuse Brother    Deep vein thrombosis Brother    Atrial fibrillation Brother    Hepatitis Brother    Cancer Maternal Grandmother 66   Reye's syndrome Daughter    Stroke Other    Colon cancer Neg Hx    Esophageal cancer Neg Hx    Stomach cancer Neg Hx    Rectal cancer Neg Hx     Medications: Patient's Medications  New Prescriptions   No medications on file  Previous Medications   AMLODIPINE (NORVASC) 5 MG TABLET    Take 1 tablet by mouth once daily   CLINDAMYCIN (CLEOCIN) 300 MG CAPSULE    4 tablets prior to dental work   FENOFIBRATE (TRICOR) 145 MG TABLET    TAKE ONE TABLET BY MOUTH ONE TIME DAILY   GLIPIZIDE (GLUCOTROL) 5 MG TABLET    TAKE 1 TABLET BY MOUTH TWICE DAILY BEFORE MEAL(S)   LOSARTAN (COZAAR) 100 MG TABLET    Take 1 tablet by mouth once daily   OMEPRAZOLE (PRILOSEC) 20 MG CAPSULE    Take 1 capsule by mouth once daily   PAROXETINE (PAXIL) 30 MG TABLET    Take 1 tablet by mouth once daily   ROSUVASTATIN (CRESTOR) 5 MG TABLET    TAKE 1 TABLET BY MOUTH THREE TIMES A WEEK  Modified Medications   No medications on file  Discontinued Medications   No medications on file    Physical Exam:  Vitals:   02/09/22 0931  Height: '5\' 4"'$  (1.626 m)   Body mass index is 36.73 kg/m. Wt Readings from Last 3 Encounters:  08/06/21 97.1 kg  01/29/21 101.6 kg   09/24/20 101.7 kg    Physical Exam Constitutional:      General: She is not in acute distress. HENT:     Right Ear: Tympanic membrane, ear canal and external ear normal. There is no impacted cerumen.     Left Ear: Tympanic membrane, ear canal and external ear normal. There is no impacted cerumen.     Mouth/Throat:     Mouth: Mucous membranes are moist.     Pharynx: No oropharyngeal exudate or posterior oropharyngeal erythema.  Eyes:     Conjunctiva/sclera: Conjunctivae  normal.     Pupils: Pupils are equal, round, and reactive to light.  Cardiovascular:     Rate and Rhythm: Normal rate and regular rhythm.     Pulses: Normal pulses.     Heart sounds: Normal heart sounds. No murmur heard. Pulmonary:     Effort: Pulmonary effort is normal. No respiratory distress.     Breath sounds: Normal breath sounds. No wheezing.  Abdominal:     General: Bowel sounds are normal. There is no distension.     Palpations: Abdomen is soft.  Musculoskeletal:        General: No swelling or tenderness. Normal range of motion.     Cervical back: Normal range of motion.  Skin:    General: Skin is warm and dry.     Findings: No rash.  Neurological:     Mental Status: She is alert and oriented to person, place, and time. Mental status is at baseline.  Psychiatric:        Mood and Affect: Mood normal.        Behavior: Behavior normal.    Labs reviewed: Basic Metabolic Panel: Recent Labs    08/03/21 0821 02/02/22 0849  NA 139 139  K 4.5 5.1  CL 103 103  CO2 25 25  GLUCOSE 128* 133*  BUN 26* 27*  CREATININE 1.69* 1.51*  CALCIUM 10.1 9.9  TSH 2.03  --    Liver Function Tests: Recent Labs    08/03/21 0821 02/02/22 0849  AST 18 18  ALT 15 17  BILITOT 0.5 0.4  PROT 7.3 7.2   No results for input(s): "LIPASE", "AMYLASE" in the last 8760 hours. No results for input(s): "AMMONIA" in the last 8760 hours. CBC: Recent Labs    08/03/21 0821  WBC 5.9  NEUTROABS 3,463  HGB 14.2  HCT  44.2  MCV 88.0  PLT 281   Lipid Panel: Recent Labs    08/03/21 0821  CHOL 121  HDL 44*  LDLCALC 55  TRIG 141  CHOLHDL 2.8   TSH: Recent Labs    08/03/21 0821  TSH 2.03   A1C: Lab Results  Component Value Date   HGBA1C 6.5 (H) 02/02/2022     Assessment/Plan 1. Type 2 diabetes mellitus with diabetic neuropathy, without long-term current use of insulin (HCC) - A1c at goal - continue  glipizide - Microalbumin/Creatinine Ratio, Urine  2. Need for influenza vaccination - received in office today - Flu Vaccine QUAD High Dose(Fluad)  3. Essential hypertension, benign - controlled  - continue norvasc, cozaar  4. Hyperlipidemia LDL goal <70 - continue crestor and fenofibrate  - lipid profile at next visit  5. Recurrent major depressive disorder, remission status unspecified ([place]) - controlled - continue paxil and lifestyle modifications.   6. Gastroesophageal reflux disease without esophagitis - continue prilosec  7. Primary hyperparathyroidism ([place]) - s/p parathyroidectomy  - stable   8. Stage 3b Chronic kidney disease  - follows with nephrologist  - continue to avoid nephrotoxic agents such as NSAIDS, hydrate.      Return in about 6 months (around 08/13/2022) for routine follow up, labs prior .  Student- Waunita Schooner, RN I personally was present during the history, physical exam and medical decision-making activities of this service and have verified that the service and findings are accurately documented in the student's note  Terisha Losasso K. [place], [facility] [phone]

## 2022-02-11 NOTE — Patient Instructions (Addendum)
Please sign a record release for ophthalmology 0 on check out.

## 2022-02-15 DIAGNOSIS — E119 Type 2 diabetes mellitus without complications: Secondary | ICD-10-CM | POA: Diagnosis not present

## 2022-02-15 DIAGNOSIS — H16223 Keratoconjunctivitis sicca, not specified as Sjogren's, bilateral: Secondary | ICD-10-CM | POA: Diagnosis not present

## 2022-02-18 ENCOUNTER — Ambulatory Visit: Payer: Self-pay

## 2022-02-18 NOTE — Patient Outreach (Signed)
  Care Coordination   Initial Visit Note   02/18/2022 Name: Jill Garza MRN: [id number] DOB: 1950/09/22  Jill Garza is a 71 y.o. year old female who sees Dewaine Oats, Carlos American, NP for primary care. I spoke with  [name] by phone today.  What matters to the patients health and wellness today?  Patient would like to discuss her BP with her kidney doctor during her next scheduled visit.     Goals Addressed               This Visit's Progress     Patient Stated     I will discuss my BP with my kidney doctor during my next appointment (pt-stated)        Care Coordination Interventions: Evaluation of current treatment plan related to hypertension self management and patient's adherence to plan as established by provider Reviewed medications with patient and discussed importance of compliance Counseled on the importance of exercise goals with target of 150 minutes per week Advised patient, providing education and rationale, to monitor blood pressure daily and record, calling PCP for findings outside established parameters Provided education on prescribed diet low Sodium  Educated patient regarding provider exercise referral program (PREP) Placed outbound call to PCP requesting PREP referral Mailed printed educational materials related hypertension management           SDOH assessments and interventions completed:  No     Care Coordination Interventions Activated:  Yes  Care Coordination Interventions:  Yes, provided   Follow up plan: Follow up call scheduled for 06/09/22'@0930'$  AM    Encounter Outcome:  Pt. Visit Completed

## 2022-02-18 NOTE — Patient Instructions (Signed)
Visit Information  Thank you for taking time to visit with me today. Please don't hesitate to contact me if I can be of assistance to you.   Following are the goals we discussed today:   Goals Addressed               This Visit's Progress     Patient Stated     I will discuss my BP with my kidney doctor during my next appointment (pt-stated)        Care Coordination Interventions: Evaluation of current treatment plan related to hypertension self management and patient's adherence to plan as established by provider Reviewed medications with patient and discussed importance of compliance Counseled on the importance of exercise goals with target of 150 minutes per week Advised patient, providing education and rationale, to monitor blood pressure daily and record, calling PCP for findings outside established parameters Provided education on prescribed diet low Sodium  Educated patient regarding provider exercise referral program (PREP) Placed outbound call to PCP requesting PREP referral Mailed printed educational materials related hypertension management           Our next appointment is by telephone on 06/09/22 at 0930  Please call the care guide team at [phone] if you need to cancel or reschedule your appointment.   If you are experiencing a Mental Health or [place] or need someone to talk to, please call 1-800-273-TALK (toll free, 24 hour hotline) go to [facility] [address] [place] [phone])  Patient verbalizes understanding of instructions and care plan provided today and agrees to view in [place]. Active MyChart status and patient understanding of how to access instructions and care plan via MyChart confirmed with patient.     Barb Merino, RN, BSN, CCM Care Management Coordinator [facility] Direct Phone: [phone]

## 2022-02-22 ENCOUNTER — Telehealth: Payer: Self-pay

## 2022-02-22 ENCOUNTER — Ambulatory Visit: Payer: Self-pay

## 2022-02-22 DIAGNOSIS — E1140 Type 2 diabetes mellitus with diabetic neuropathy, unspecified: Secondary | ICD-10-CM

## 2022-02-22 NOTE — Telephone Encounter (Signed)
Angel little with [facility] called and left a voicemail on the clinical intake voicemail requesting a referral order for the provider exercise program, also known as PREP for patient to participated in. No additional information was provided.  Order is pending for Lauree Chandler, NP to review and complete if in agreement with request   Side note- there is a care coordinator note from [facility] in [place] from today

## 2022-02-22 NOTE — Patient Outreach (Signed)
  Care Coordination   Follow Up Visit Note   02/22/2022 Name: Jill Garza MRN: [id number] DOB: [birth year]  Jill Garza is a 71 y.o. year old female who sees Dewaine Oats, Carlos American, NP for primary care. I  left a voice message for the intake nurse with [facility] regarding a PREP referral.   What matters to the patients health and wellness today?  Patient would like to participate in the provider referral exercise program (PREP).     Goals Addressed               This Visit's Progress     Patient Stated     I will discuss my BP with my kidney doctor during my next appointment (pt-stated)        Care Coordination Interventions: Placed outbound call to PCP provider Sherrie Mustache NP, left a voice message for triage intake requesting a PREP (provider exercise program) be entered via Epic per patient's request Provided the contact number for this RN care coordinator encouraging a call back for questions or if further assistance is needed          SDOH assessments and interventions completed:  No     Care Coordination Interventions Activated:  Yes  Care Coordination Interventions:  Yes, provided   Follow up plan: Follow up call scheduled for 06/09/22'@0930'$  AM    Encounter Outcome:  Pt. Visit Completed

## 2022-02-23 NOTE — Telephone Encounter (Signed)
I left a detailed voicemail for Jill Garza informing her that we received her voicemail yesterday and her request is in process. I advised April to call if she has questions, concerns, or additional request.

## 2022-02-23 NOTE — Telephone Encounter (Signed)
Referral placed.

## 2022-03-14 ENCOUNTER — Other Ambulatory Visit: Payer: Self-pay | Admitting: Nurse Practitioner

## 2022-03-14 IMAGING — CT CT MAXILLOFACIAL W/O CM
1 series · 15 of 30 positions shown, 19 images · non-contrast
Comparison: None.

CLINICAL DATA: Sinonasal obstruction.  Chronic rhinitis.

EXAM:
CT MAXILLOFACIAL WITHOUT CONTRAST
TECHNIQUE: Multidetector CT images of the paranasal sinuses were obtained using
the standard protocol without intravenous contrast.

[Series 4: soft tissue · axial · 0.40mm/px · z∈[+970,+1099]mm · 15 of 139 slices shown, 19 images]
[im 5/139  brain]
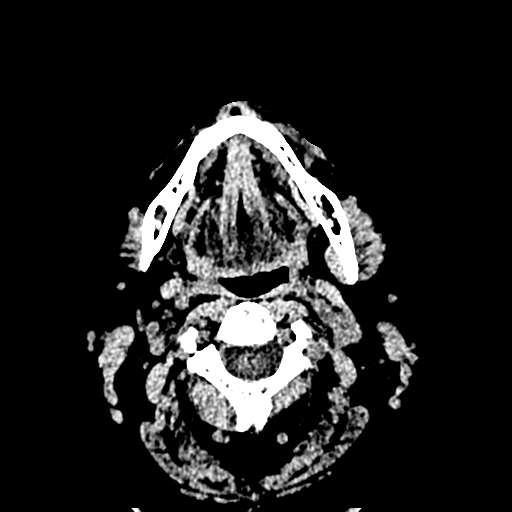
[im 5/139  bone]
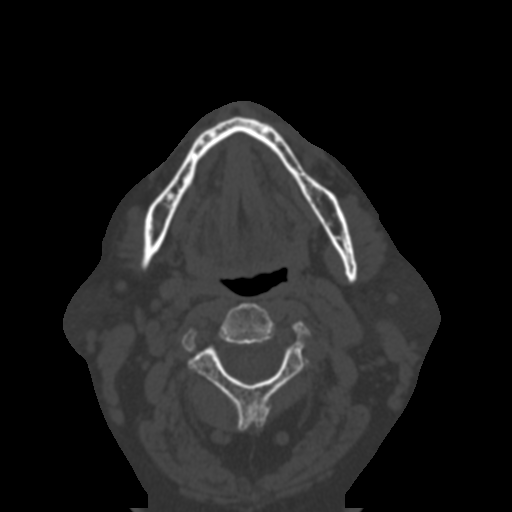
[im 15/139  bone]
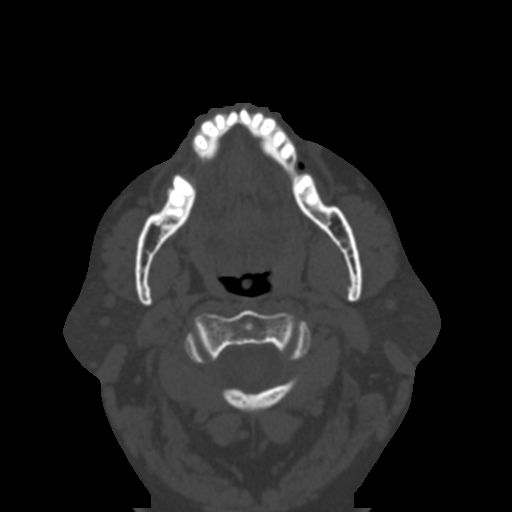
[im 24/139  bone]
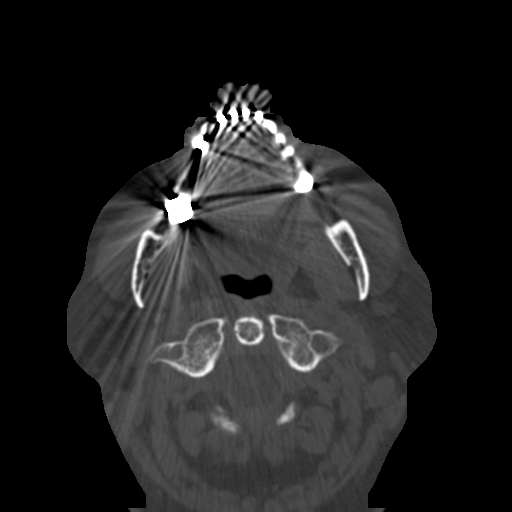
[im 34/139  bone]
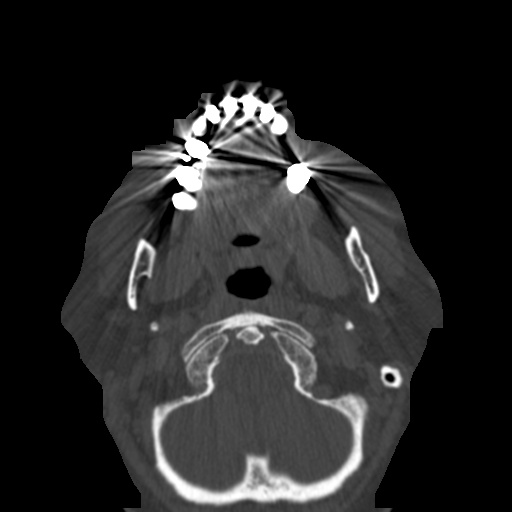
[im 43/139  brain]
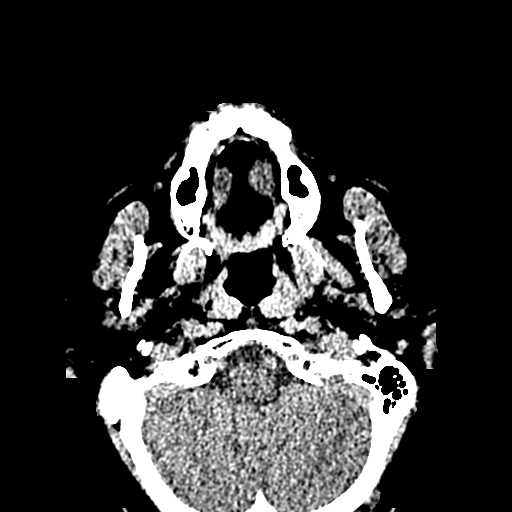
[im 43/139  bone]
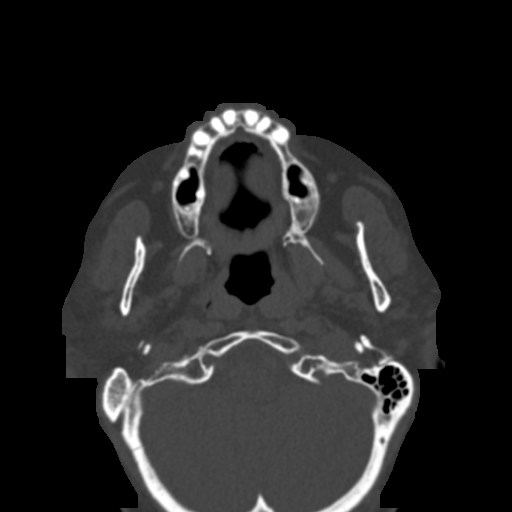
[im 53/139  bone]
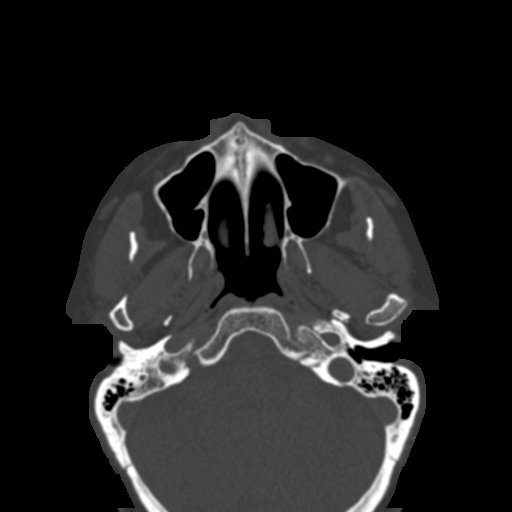
[im 62/139  bone]
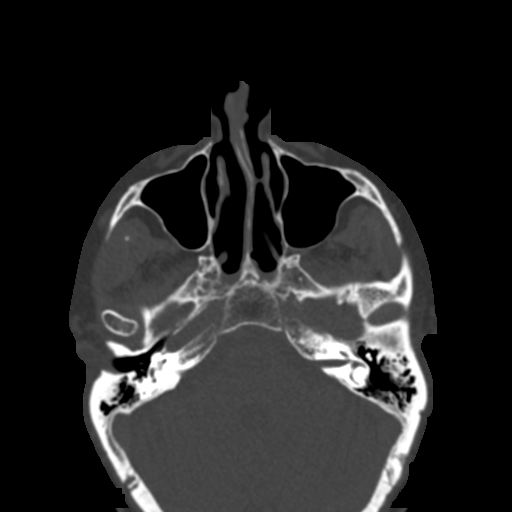
[im 72/139  bone]
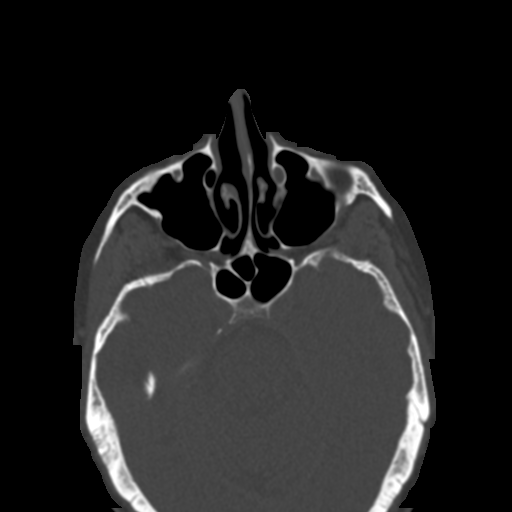
[im 77/139  brain]
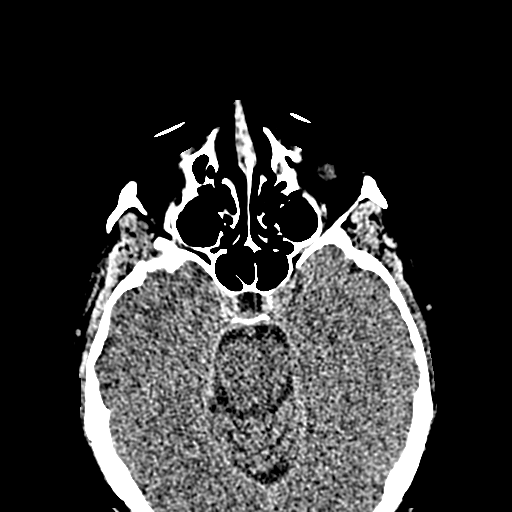
[im 77/139  bone]
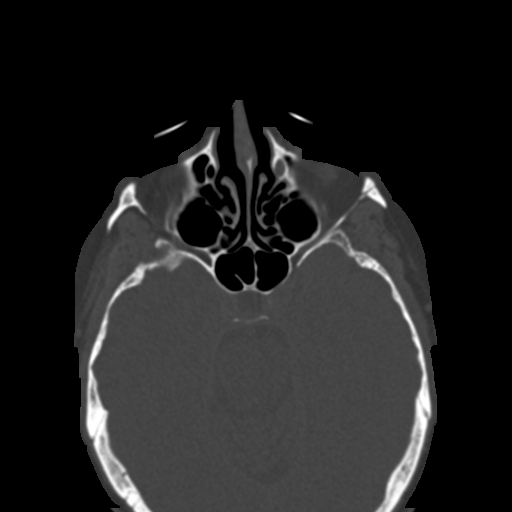
[im 86/139  bone]
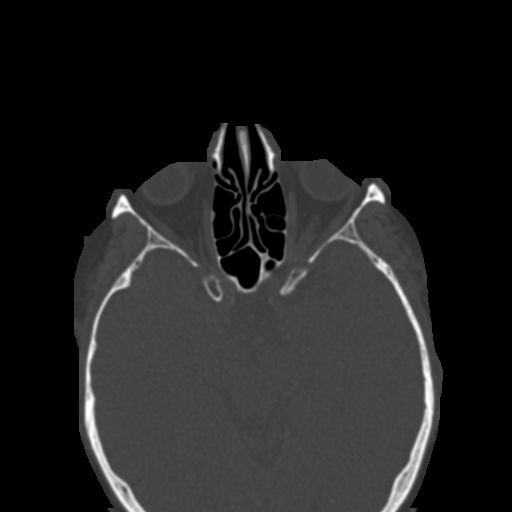
[im 96/139  bone]
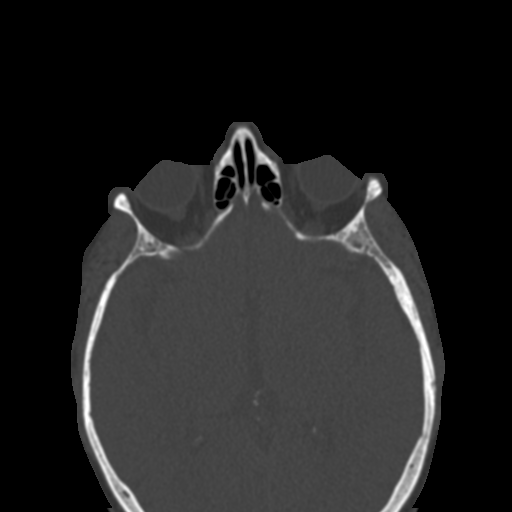
[im 105/139  bone]
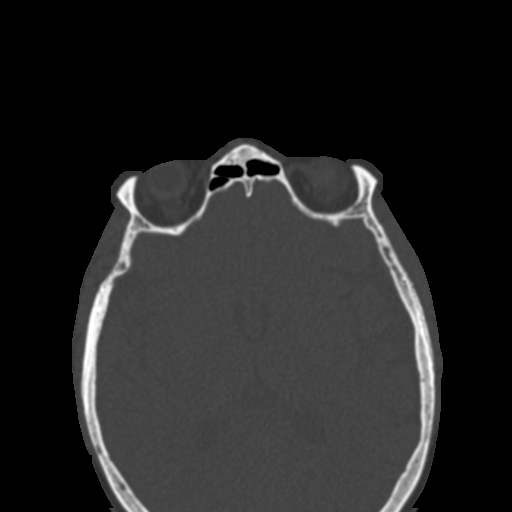
[im 115/139  brain]
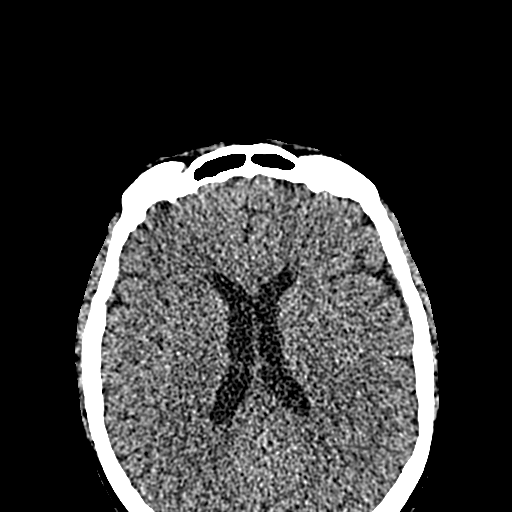
[im 115/139  bone]
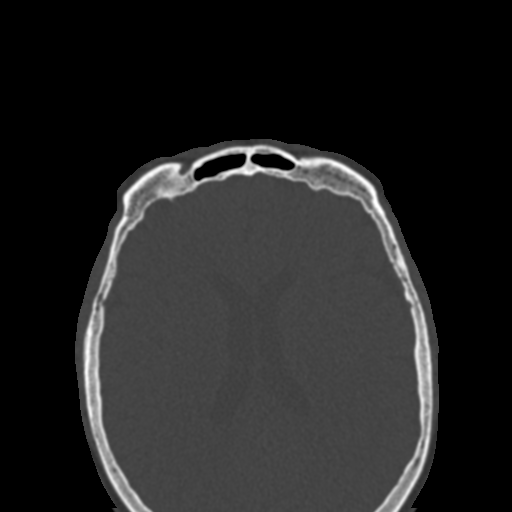
[im 124/139  bone]
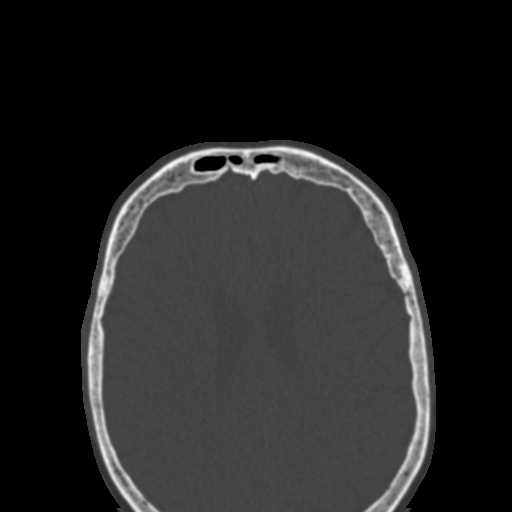
[im 134/139  bone]
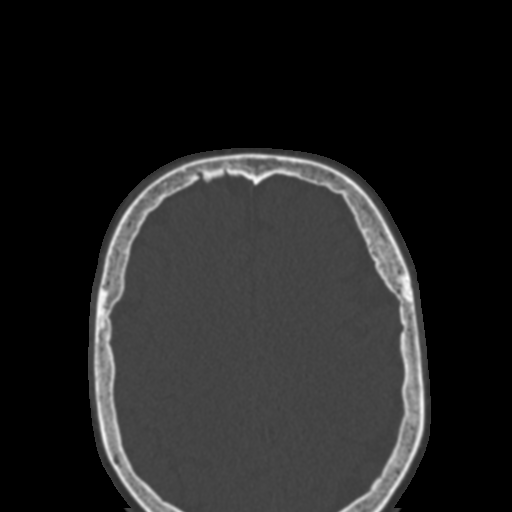

[15 of 30 positions shown; findings below may reference images not displayed]

FINDINGS: Paranasal sinuses:

Frontal: Normally aerated. Patent frontal sinus drainage pathways.

Ethmoid: Normally aerated.

Maxillary: Normally aerated.

Sphenoid: Normally aerated. Patent sphenoethmoidal recesses.

Right ostiomeatal unit: Patent.

Left ostiomeatal unit: Patent.

Nasal passages: Patent. Nasal septum deviates 3 mm to the left and
contacts the left inferior turbinate without obstruction.

Anatomy: No pneumatization superior to anterior ethmoid notches.
Symmetric and intact olfactory grooves and fovea ethmoidalis, Keros
II (4-7mm) Presellar sphenoid pneumatization pattern. No dehiscence
of carotid or optic canals. No onodi cell.

Other: Orbits and intracranial compartment are unremarkable. Visible
mastoid air cells are normally aerated.
IMPRESSION: 1. Normally aerated paranasal sinuses. Patent sinus drainage
pathways.
2. 3 mm leftward nasal septal deviation.

## 2022-03-29 ENCOUNTER — Other Ambulatory Visit: Payer: Self-pay | Admitting: Nurse Practitioner

## 2022-03-29 DIAGNOSIS — I10 Essential (primary) hypertension: Secondary | ICD-10-CM

## 2022-04-10 DIAGNOSIS — N390 Urinary tract infection, site not specified: Secondary | ICD-10-CM | POA: Diagnosis not present

## 2022-04-21 DIAGNOSIS — N390 Urinary tract infection, site not specified: Secondary | ICD-10-CM | POA: Diagnosis not present

## 2022-04-21 DIAGNOSIS — N1832 Chronic kidney disease, stage 3b: Secondary | ICD-10-CM | POA: Diagnosis not present

## 2022-04-21 DIAGNOSIS — E213 Hyperparathyroidism, unspecified: Secondary | ICD-10-CM | POA: Diagnosis not present

## 2022-04-21 DIAGNOSIS — E1122 Type 2 diabetes mellitus with diabetic chronic kidney disease: Secondary | ICD-10-CM | POA: Diagnosis not present

## 2022-04-21 DIAGNOSIS — I129 Hypertensive chronic kidney disease with stage 1 through stage 4 chronic kidney disease, or unspecified chronic kidney disease: Secondary | ICD-10-CM | POA: Diagnosis not present

## 2022-04-21 DIAGNOSIS — E785 Hyperlipidemia, unspecified: Secondary | ICD-10-CM | POA: Diagnosis not present

## 2022-04-26 DIAGNOSIS — J209 Acute bronchitis, unspecified: Secondary | ICD-10-CM | POA: Diagnosis not present

## 2022-05-05 ENCOUNTER — Other Ambulatory Visit: Payer: Self-pay | Admitting: Nurse Practitioner

## 2022-05-05 DIAGNOSIS — K219 Gastro-esophageal reflux disease without esophagitis: Secondary | ICD-10-CM

## 2022-05-05 DIAGNOSIS — I10 Essential (primary) hypertension: Secondary | ICD-10-CM

## 2022-06-07 ENCOUNTER — Ambulatory Visit: Payer: Medicare Other | Admitting: Nurse Practitioner

## 2022-06-07 ENCOUNTER — Encounter: Payer: Self-pay | Admitting: Nurse Practitioner

## 2022-06-07 DIAGNOSIS — Z0000 Encounter for general adult medical examination without abnormal findings: Secondary | ICD-10-CM | POA: Diagnosis not present

## 2022-06-07 NOTE — Progress Notes (Signed)
This service is provided via telemedicine  No vital signs collected/recorded due to the encounter was a telemedicine visit.   Location of patient (ex: home, work):  Home  Patient consents to a telephone visit:  Yes, see encounter dated 02/22/2022  Location of the provider (ex: office, home):  [place]   Name of any referring provider:  N/A  Names of all persons participating in the telemedicine service and their role in the encounter:  Sherrie Mustache, Nurse Practitioner, Carroll Kinds, CMA, and patient.   Time spent on call:  12 minutes with medical assistant

## 2022-06-07 NOTE — Patient Instructions (Signed)
Jill Garza , Thank you for taking time to come for your Medicare Wellness Visit. I appreciate your ongoing commitment to your health goals. Please review the following plan we discussed and let me know if I can assist you in the future.   Screening recommendations/referrals: Colonoscopy up to date Mammogram up to date Bone Density declines  Recommended yearly ophthalmology/optometry visit for glaucoma screening and checkup Recommended yearly dental visit for hygiene and checkup  Vaccinations: Influenza vaccine- due annually in September/October Pneumococcal vaccine up to date Tdap vaccine up to date Shingles vaccine up to date    Advanced directives: on file  Conditions/risks identified: obesity, advanced age.  Next appointment: yearly    Preventive Care 30 Years and Older, Female Preventive care refers to lifestyle choices and visits with your health care provider that can promote health and wellness. What does preventive care include? A yearly physical exam. This is also called an annual well check. Dental exams once or twice a year. Routine eye exams. Ask your health care provider how often you should have your eyes checked. Personal lifestyle choices, including: Daily care of your teeth and gums. Regular physical activity. Eating a healthy diet. Avoiding tobacco and drug use. Limiting alcohol use. Practicing safe sex. Taking low-dose aspirin every day. Taking vitamin and mineral supplements as recommended by your health care provider. What happens during an annual well check? The services and screenings done by your health care provider during your annual well check will depend on your age, overall health, lifestyle risk factors, and family history of disease. Counseling  Your health care provider may ask you questions about your: Alcohol use. Tobacco use. Drug use. Emotional well-being. Home and relationship well-being. Sexual activity. Eating habits. History of  falls. Memory and ability to understand (cognition). Work and work Statistician. Reproductive health. Screening  You may have the following tests or measurements: Height, weight, and BMI. Blood pressure. Lipid and cholesterol levels. These may be checked every 5 years, or more frequently if you are over 47 years old. Skin check. Lung cancer screening. You may have this screening every year starting at age 63 if you have a 30-pack-year history of smoking and currently smoke or have quit within the past 15 years. Fecal occult blood test (FOBT) of the stool. You may have this test every year starting at age 79. Flexible sigmoidoscopy or colonoscopy. You may have a sigmoidoscopy every 5 years or a colonoscopy every 10 years starting at age 53. Hepatitis C blood test. Hepatitis B blood test. Sexually transmitted disease (STD) testing. Diabetes screening. This is done by checking your blood sugar (glucose) after you have not eaten for a while (fasting). You may have this done every 1-3 years. Bone density scan. This is done to screen for osteoporosis. You may have this done starting at age 50. Mammogram. This may be done every 1-2 years. Talk to your health care provider about how often you should have regular mammograms. Talk with your health care provider about your test results, treatment options, and if necessary, the need for more tests. Vaccines  Your health care provider may recommend certain vaccines, such as: Influenza vaccine. This is recommended every year. Tetanus, diphtheria, and acellular pertussis (Tdap, Td) vaccine. You may need a Td booster every 10 years. Zoster vaccine. You may need this after age 22. Pneumococcal 13-valent conjugate (PCV13) vaccine. One dose is recommended after age 40. Pneumococcal polysaccharide (PPSV23) vaccine. One dose is recommended after age 69. Talk to your health care  provider about which screenings and vaccines you need and how often you need  them. This information is not intended to replace advice given to you by your health care provider. Make sure you discuss any questions you have with your health care provider. Document Released: 05/22/2015 Document Revised: 01/13/2016 Document Reviewed: 02/24/2015 Elsevier Interactive Patient Education  2017 [place] Prevention in the Home Falls can cause injuries. They can happen to people of all ages. There are many things you can do to make your home safe and to help prevent falls. What can I do on the outside of my home? Regularly fix the edges of walkways and driveways and fix any cracks. Remove anything that might make you trip as you walk through a door, such as a raised step or threshold. Trim any bushes or trees on the path to your home. Use bright outdoor lighting. Clear any walking paths of anything that might make someone trip, such as rocks or tools. Regularly check to see if handrails are loose or broken. Make sure that both sides of any steps have handrails. Any raised decks and porches should have guardrails on the edges. Have any leaves, snow, or ice cleared regularly. Use sand or salt on walking paths during winter. Clean up any spills in your garage right away. This includes oil or grease spills. What can I do in the bathroom? Use night lights. Install grab bars by the toilet and in the tub and shower. Do not use towel bars as grab bars. Use non-skid mats or decals in the tub or shower. If you need to sit down in the shower, use a plastic, non-slip stool. Keep the floor dry. Clean up any water that spills on the floor as soon as it happens. Remove soap buildup in the tub or shower regularly. Attach bath mats securely with double-sided non-slip rug tape. Do not have throw rugs and other things on the floor that can make you trip. What can I do in the bedroom? Use night lights. Make sure that you have a light by your bed that is easy to reach. Do not use  any sheets or blankets that are too big for your bed. They should not hang down onto the floor. Have a firm chair that has side arms. You can use this for support while you get dressed. Do not have throw rugs and other things on the floor that can make you trip. What can I do in the kitchen? Clean up any spills right away. Avoid walking on wet floors. Keep items that you use a lot in easy-to-reach places. If you need to reach something above you, use a strong step stool that has a grab bar. Keep electrical cords out of the way. Do not use floor polish or wax that makes floors slippery. If you must use wax, use non-skid floor wax. Do not have throw rugs and other things on the floor that can make you trip. What can I do with my stairs? Do not leave any items on the stairs. Make sure that there are handrails on both sides of the stairs and use them. Fix handrails that are broken or loose. Make sure that handrails are as long as the stairways. Check any carpeting to make sure that it is firmly attached to the stairs. Fix any carpet that is loose or worn. Avoid having throw rugs at the top or bottom of the stairs. If you do have throw rugs, attach them to the  floor with carpet tape. Make sure that you have a light switch at the top of the stairs and the bottom of the stairs. If you do not have them, ask someone to add them for you. What else can I do to help prevent falls? Wear shoes that: Do not have high heels. Have rubber bottoms. Are comfortable and fit you well. Are closed at the toe. Do not wear sandals. If you use a stepladder: Make sure that it is fully opened. Do not climb a closed stepladder. Make sure that both sides of the stepladder are locked into place. Ask someone to hold it for you, if possible. Clearly mark and make sure that you can see: Any grab bars or handrails. First and last steps. Where the edge of each step is. Use tools that help you move around (mobility aids)  if they are needed. These include: Canes. Walkers. Scooters. Crutches. Turn on the lights when you go into a dark area. Replace any light bulbs as soon as they burn out. Set up your furniture so you have a clear path. Avoid moving your furniture around. If any of your floors are uneven, fix them. If there are any pets around you, be aware of where they are. Review your medicines with your doctor. Some medicines can make you feel dizzy. This can increase your chance of falling. Ask your doctor what other things that you can do to help prevent falls. This information is not intended to replace advice given to you by your health care provider. Make sure you discuss any questions you have with your health care provider. Document Released: 02/19/2009 Document Revised: 10/01/2015 Document Reviewed: 05/30/2014 Elsevier Interactive Patient Education  2017 [employer].

## 2022-06-07 NOTE — Progress Notes (Signed)
Subjective:   Jill Garza is a 72 y.o. female who presents for Medicare Annual (Subsequent) preventive examination.  Review of Systems           Objective:    There were no vitals filed for this visit. There is no height or weight on file to calculate BMI.     06/07/2022   11:00 AM 02/09/2022    9:24 AM 08/06/2021    9:51 AM 05/04/2021    3:21 PM 09/10/2020    6:51 AM 07/24/2020    2:48 PM 05/28/2020    2:55 PM  Advanced Directives  Does Patient Have a Medical Advance Directive? Yes Yes Yes Yes Yes Yes Yes  Type of Industrial/product designer of [employer] Power of [place];Living will [place];Living will [place];Living will [place];Living will;Out of facility DNR (pink MOST or yellow form) Living will;Healthcare Power of [place];Out of facility DNR (pink MOST or yellow form)  Does patient want to make changes to medical advance directive? No - Patient declined No - Patient declined No - Patient declined No - Patient declined No - Patient declined No - Patient declined No - Patient declined  Copy of [place] in Chart? Yes - validated most recent copy scanned in chart (See row information) Yes - validated most recent copy scanned in chart (See row information) Yes - validated most recent copy scanned in chart (See row information) No - copy requested No - copy requested No - copy requested No - copy requested    Current Medications (verified) Outpatient Encounter Medications as of 06/07/2022  Medication Sig   amLODipine (NORVASC) 5 MG tablet Take 1 tablet by mouth once daily   clindamycin (CLEOCIN) 300 MG capsule 4 tablets prior to dental work   fenofibrate (TRICOR) 145 MG tablet TAKE ONE TABLET BY MOUTH ONE TIME DAILY   glipiZIDE (GLUCOTROL) 5 MG tablet TAKE 1 TABLET BY MOUTH TWICE DAILY BEFORE MEAL(S)   losartan (COZAAR) 100 MG tablet Take 1 tablet  by mouth once daily   omeprazole (PRILOSEC) 20 MG capsule Take 1 capsule by mouth once daily   PARoxetine (PAXIL) 30 MG tablet Take 1 tablet by mouth once daily   rosuvastatin (CRESTOR) 5 MG tablet TAKE 1 TABLET BY MOUTH THREE TIMES A WEEK   No facility-administered encounter medications on file as of 06/07/2022.    Allergies (verified) Penicillins, Ivp dye [iodinated contrast media], and Percocet [oxycodone-acetaminophen]   History: Past Medical History:  Diagnosis Date   Arthritis    Cancer ([employer])    melanoma- right leg   Chronic kidney disease    Depression    GERD (gastroesophageal reflux disease)    pt denies    History of hiatal hernia    Hypercholesterolemia    Hyperparathyroidism (HCC)    Hypertension    Sweat, sweating, excessive    Type 2 diabetes mellitus ([place])    Past Surgical History:  Procedure Laterality Date   ABDOMINAL HYSTERECTOMY  1979   APPENDECTOMY  1964   BREAST EXCISIONAL BIOPSY Left 08/2014   benign   BREAST LUMPECTOMY WITH RADIOACTIVE SEED LOCALIZATION Left 12/31/2014   Procedure: LEFT BREAST LUMPECTOMY WITH RADIOACTIVE SEED LOCALIZATION;  Surgeon: Donnie Mesa, MD;  Location: [facility];  Service: General;  Laterality: Left;   CARPAL TUNNEL RELEASE Right 1977   CARPAL TUNNEL RELEASE Left 2007   CATARACT EXTRACTION Left 04/13/2014   Dr.  Hecker   CATARACT EXTRACTION Right 06/10/2014   Dr. Herbert Deaner   CHOLECYSTECTOMY N/A 07/14/2017   Procedure: LAPAROSCOPIC CHOLECYSTECTOMY;  Surgeon: Ileana Roup, MD;  Location[facility] OR;  Service: General;  Laterality: N/A;   COLONOSCOPY  2009   JOINT REPLACEMENT     MELANOMA EXCISION     PARATHYROIDECTOMY N/A 09/10/2020   Procedure: PARATHYROIDECTOMY;  Surgeon: Armandina Gemma, MD;  Location: [facility];  Service: General;  Laterality: N/A;   THYROID EXPLORATION N/A 09/10/2020   Procedure: POSSIBLE NECK EXPLORATION;  Surgeon: Armandina Gemma, MD;  Location: [facility];  Service: General;  Laterality: N/A;    TONSILLECTOMY  1962   TOTAL KNEE ARTHROPLASTY Left 2012   TOTAL KNEE ARTHROPLASTY Right 2008   Family History  Problem Relation Age of Onset   Pulmonary embolism Mother        Multiple, B/L    Heart attack Father        Died of MI age 33   Hypertension Sister    Diabetes type II Sister    High Cholesterol Sister    Deep vein thrombosis Sister    Hypertension Brother    Heart disease Brother 38   Diabetes Brother    High Cholesterol Brother    Hypertension Brother    High Cholesterol Brother    Alcohol abuse Brother    Deep vein thrombosis Brother    Atrial fibrillation Brother    Hepatitis Brother    Cancer Maternal Grandmother 66   Reye's syndrome Daughter    Stroke Other    Colon cancer Neg Hx    Esophageal cancer Neg Hx    Stomach cancer Neg Hx    Rectal cancer Neg Hx    Social History   Socioeconomic History   Marital status: Married    Spouse name: Not on file   Number of children: Not on file   Years of education: Not on file   Highest education level: Not on file  Occupational History   Not on file  Tobacco Use   Smoking status: Former    Packs/day: 1.00    Years: 15.00    Total pack years: 15.00    Types: Cigarettes    Quit date: 05/09/1986    Years since quitting: 36.1   Smokeless tobacco: Never  Vaping Use   Vaping Use: Never used  Substance and Sexual Activity   Alcohol use: Yes    Alcohol/week: 0.0 standard drinks of alcohol    Comment: rare   Drug use: No   Sexual activity: Yes  Other Topics Concern   Not on file  Social History Narrative   Not on file   Social Determinants of Health   Financial Resource Strain: Low Risk  (04/13/2017)   Overall Financial Resource Strain (CARDIA)    Difficulty of Paying Living Expenses: Not hard at all  Food Insecurity: No Food Insecurity (04/13/2017)   Hunger Vital Sign    Worried About Running Out of Food in the Last Year: Never true    Ran Out of Food in the Last Year: Never true  Transportation  Needs: No Transportation Needs (04/13/2017)   PRAPARE - Hydrologist (Medical): No    Lack of Transportation (Non-Medical): No  Physical Activity: Inactive (04/13/2017)   Exercise Vital Sign    Days of Exercise per Week: 0 days    Minutes of Exercise per Session: 0 min  Stress: No Stress Concern Present (04/18/2018)   [employer]  of Occupational Health - Occupational Stress Questionnaire    Feeling of Stress : Not at all  Social Connections: Somewhat Isolated (04/13/2017)   Social Connection and Isolation Panel [NHANES]    Frequency of Communication with Friends and Family: More than three times a week    Frequency of Social Gatherings with Friends and Family: More than three times a week    Attends Religious Services: Never    Marine scientist or Organizations: No    Attends Archivist Meetings: Never    Marital Status: Married    Tobacco Counseling Counseling given: Not Answered   Clinical Intake:                 Diabetic?yes         Activities of Daily Living     No data to display          Patient Care Team: Lauree Chandler, NP as PCP - General (Geriatric Medicine) Minus Breeding, MD as PCP - Cardiology (Cardiology) [facility], Neena Rhymes, MD as Consulting Physician (Ophthalmology) Rex Kras, Claudette Stapler, RN as [place] any recent [place] you may have received from other than [facility] providers in the past year (date may be approximate).     Assessment:   This is a routine wellness examination for [place].  Hearing/Vision screen Hearing Screening - Comments:: Patient has some hearing issues with left ear. Vision Screening - Comments:: Patient has no vision problems. Patient wears glasses. Patient had yearly eye exam in October. Patient sees Dr. Alanda Slim  Dietary issues and exercise activities discussed:     Goals Addressed   None    Depression  Screen    06/07/2022   10:57 AM 08/06/2021   10:02 AM 05/04/2021    3:22 PM 04/22/2020    9:53 AM 01/24/2020    9:38 AM 10/23/2019   10:13 AM 04/22/2019    9:35 AM  PHQ 2/9 Scores  PHQ - 2 Score 0 0 0 0 0 0 0    Fall Risk    06/07/2022   10:58 AM 08/06/2021   10:02 AM 05/04/2021    3:23 PM 05/28/2020    2:55 PM 04/22/2020    9:55 AM  Fall Risk   Falls in the past year? 0 0 1 0 0  Number falls in past yr: 0 0 1 0 0  Injury with Fall? 0 0 0 0 0  Risk for fall due to : No Fall Risks No Fall Risks History of fall(s)    Follow up Falls evaluation completed Falls evaluation completed Falls evaluation completed      FALL RISK PREVENTION PERTAINING TO THE HOME:  Any stairs in or around the home? Yes  If so, are there any without handrails? No  Home free of loose throw rugs in walkways, pet beds, electrical cords, etc? Yes  Adequate lighting in your home to reduce risk of falls? Yes   ASSISTIVE DEVICES UTILIZED TO PREVENT FALLS:  Life alert? No  Use of a cane, walker or w/c? No  Grab bars in the bathroom? Yes  Shower chair or bench in shower? Yes  Elevated toilet seat or a handicapped toilet? Yes   TIMED UP AND GO:  Was the test performed? No .    Cognitive Function:    04/22/2019    9:36 AM 04/13/2017    9:30 AM 03/18/2016    9:15 AM 01/29/2015    1:50 PM  MMSE -  Mini Mental State Exam  Not completed: Refused     Orientation to time '5 5 5 5  '$ Orientation to Place '5 5 5 5  '$ Registration '3 3 3 3  '$ Attention/ Calculation '5 5 5 5  '$ Recall '3 3 3 1  '$ Language- name 2 objects '2 2 2 2  '$ Language- repeat '1 1 1 1  '$ Language- follow 3 step command '3 3 3 3  '$ Language- read & follow direction '1 1 1 1  '$ Write a sentence '1 1 1 1  '$ Copy design '1 1 1 1  '$ Total score '30 30 30 28        '$ 06/07/2022   11:00 AM 05/04/2021    3:24 PM 04/22/2020   10:00 AM  6CIT Screen  What Year? 0 points 0 points 0 points  What month? 0 points 0 points 0 points  What time? 0 points 0 points 0 points   Count back from 20 0 points 0 points 0 points  Months in reverse 0 points 0 points 0 points  Repeat phrase 0 points 2 points 0 points  Total Score 0 points 2 points 0 points    Immunizations Immunization History  Administered Date(s) Administered   Fluad Quad(high Dose 65+) 02/05/2019, 03/06/2020, 01/29/2021, 02/11/2022   Influenza Whole 01/26/2013   Influenza, High Dose Seasonal PF 02/21/2018   Influenza-Unspecified 01/28/2014, 02/18/2016, 03/01/2017   PFIZER(Purple Top)SARS-COV-2 Vaccination 05/31/2019, 06/21/2019, 02/04/2020, 08/07/2020   Pfizer Covid-19 Vaccine Bivalent Booster 56yr & up 02/06/2021, 01/27/2022   Pneumococcal Conjugate-13 12/17/2013   Pneumococcal Polysaccharide-23 04/09/2013, 04/18/2018   Tdap 08/17/2009, 10/24/2019   Unspecified SARS-COV-2 Vaccination 01/28/2022   Zoster Recombinat (Shingrix) 04/18/2018, 06/25/2018   Zoster, Live 01/09/2011    TDAP status: Up to date  Flu Vaccine status: Up to date  Pneumococcal vaccine status: Up to date  Covid-19 vaccine status: Information provided on how to obtain vaccines.   Qualifies for Shingles Vaccine? Yes   Zostavax completed No   Shingrix Completed?: Yes  Screening Tests Health Maintenance  Topic Date Due   Diabetic kidney evaluation - Urine ACR  04/13/2018   OPHTHALMOLOGY EXAM  09/28/2021   COVID-19 Vaccine (7 - 2023-24 season) 03/25/2022   HEMOGLOBIN A1C  08/03/2022   FOOT EXAM  08/07/2022   Diabetic kidney evaluation - eGFR measurement  02/03/2023   Medicare Annual Wellness (AWV)  06/08/2023   MAMMOGRAM  02/10/2024   COLONOSCOPY (Pts 45-441yrInsurance coverage will need to be confirmed)  06/16/2025   DTaP/Tdap/Td (3 - Td or Tdap) 10/23/2029   Pneumonia Vaccine 6519Years old  Completed   INFLUENZA VACCINE  Completed   DEXA SCAN  Completed   Hepatitis C Screening  Completed   Zoster Vaccines- Shingrix  Completed   HPV VACCINES  Aged Out    Health Maintenance  Health Maintenance Due   Topic Date Due   Diabetic kidney evaluation - Urine ACR  04/13/2018   OPHTHALMOLOGY EXAM  09/28/2021   COVID-19 Vaccine (7 - 2023-24 season) 03/25/2022    Colorectal cancer screening: Type of screening: Colonoscopy. Completed 06/16/2020. Repeat every 5 years  Mammogram status: Completed 02/09/2022. Repeat every year  Bone Density status: Completed 2021. Results reflect: Bone density results: OSTEOPENIA. Repeat every 2 years. Declines bone density at this time  Lung Cancer Screening: (Low Dose CT Chest recommended if Age 74-80ears, 30 pack-year currently smoking OR have quit w/in 15years.) does not qualify.   Lung Cancer Screening Referral: na  Additional Screening:  Hepatitis C Screening: does  qualify; Completed 2016  Vision Screening: Recommended annual ophthalmology exams for early detection of glaucoma and other disorders of the eye. Is the patient up to date with their annual eye exam?  Yes  Who is the provider or what is the name of the office in which the patient attends annual eye exams? [facility] If pt is not established with a provider, would they like to be referred to a provider to establish care? Yes .   Dental Screening: Recommended annual dental exams for proper oral hygiene  Community Resource Referral / Chronic Care Management: CRR required this visit?  No   CCM required this visit?  No      Plan:     I have personally reviewed and noted the following in the patient's chart:   Medical and social history Use of alcohol, tobacco or illicit drugs  Current medications and supplements including opioid prescriptions. Patient is not currently taking opioid prescriptions. Functional ability and status Nutritional status Physical activity Advanced directives List of other physicians Hospitalizations, surgeries, and ER visits in previous 12 months Vitals Screenings to include cognitive, depression, and falls Referrals and appointments  In addition, I have  reviewed and discussed with patient certain preventive protocols, quality metrics, and best practice recommendations. A written personalized care plan for preventive services as well as general preventive health recommendations were provided to patient.     Lauree Chandler, NP   06/07/2022  Virtual Visit via Video Note  I connected with Zylah Elsbernd Velarde on 06/07/22 at 11:00 AM EST by a video enabled telemedicine application and verified that I am speaking with the correct person using two identifiers.  Location: Patient: home Provider: [facility]   I discussed the limitations of evaluation and management by telemedicine and the availability of in person appointments. The patient expressed understanding and agreed to proceed.    I discussed the assessment and treatment plan with the patient. The patient was provided an opportunity to ask questions and all were answered. The patient agreed with the plan and demonstrated an understanding of the instructions.   The patient was advised to call back or seek an in-person evaluation if the symptoms worsen or if the condition fails to improve as anticipated.  I provided 13 minutes of non-face-to-face time during this encounter.  Carlos American. Dewaine Oats, AGNP Avs printed and mailed.

## 2022-06-09 ENCOUNTER — Ambulatory Visit: Payer: Self-pay

## 2022-06-09 NOTE — Patient Instructions (Signed)
Visit Information  Thank you for taking time to visit with me today. Please don't hesitate to contact me if I can be of assistance to you.   Following are the goals we discussed today:   Goals Addressed               This Visit's Progress     Patient Stated     COMPLETED: I will discuss my BP with my kidney doctor during my next appointment (pt-stated)        Care Coordination Interventions: Informed patient of PCP referral for PREP was placed in October  Determined patient did not receive a call from the [place] team, however she is not able to participate at this time due spouse experienced recent stroke Discussed patient will consider for future participation and notify a member of her health care team when she is ready to sign up       Other     To maintain and or improve A1c        Care Coordination Interventions: Review of patient status, including review of consultants reports, relevant laboratory and other test results, and medications completed Mailed printed educational materials related to diabetes management  Reviewed scheduled/upcoming provider appointment including: next PCP follow up appointment scheduled for 08/15/22 '@10'$  AM         To manage CKD        Care Coordination Interventions: Assessed the Patient understanding of chronic kidney disease    Evaluation of current treatment plan related to chronic kidney disease self management and patient's adherence to plan as established by provider      Determined patient completed recent f/u with Nephrologist and was advised kidney function remains stable Counseled on importance to keep all scheduled appointments as directed            Our next appointment is by telephone on 08/17/22 at 10:30 AM  Please call the care guide team at [phone] if you need to cancel or reschedule your appointment.   If you are experiencing a Mental Health or [place] or need someone to talk to, please go to  [facility] [address] [place] [phone])  Patient verbalizes understanding of instructions and care plan provided today and agrees to view in [place]. Active MyChart status and patient understanding of how to access instructions and care plan via MyChart confirmed with patient.     Barb Merino, RN, BSN, CCM Care Management Coordinator [facility] Care Management  Direct Phone: [phone]

## 2022-06-09 NOTE — Patient Outreach (Signed)
  Care Coordination   Follow Up Visit Note   06/09/2022 Name: Jill Garza MRN: [id number] DOB: 11-08-1950  Jill Garza is a 72 y.o. year old female who sees Dewaine Oats, Carlos American, NP for primary care. I spoke with  Jill Garza by phone today.  What matters to the patients health and wellness today?  Patient would like to maintain her current health status.     Goals Addressed               This Visit's Progress     Patient Stated     COMPLETED: I will discuss my BP with my kidney doctor during my next appointment (pt-stated)        Care Coordination Interventions: Informed patient of PCP referral for PREP was placed in October  Determined patient did not receive a call from the [place] team, however she is not able to participate at this time due spouse experienced recent stroke Discussed patient will consider for future participation and notify a member of her health care team when she is ready to sign up       Other     To maintain and or improve A1c        Care Coordination Interventions: Review of patient status, including review of consultants reports, relevant laboratory and other test results, and medications completed Mailed printed educational materials related to diabetes management  Reviewed scheduled/upcoming provider appointment including: next PCP follow up appointment scheduled for 08/15/22 '@10'$  AM         To manage CKD        Care Coordination Interventions: Assessed the Patient understanding of chronic kidney disease    Evaluation of current treatment plan related to chronic kidney disease self management and patient's adherence to plan as established by provider      Determined patient completed recent f/u with Nephrologist and was advised kidney function remains stable Counseled on importance to keep all scheduled appointments as directed            SDOH assessments and interventions completed:  No     Care Coordination  Interventions:  Yes, provided   Follow up plan: Follow up call scheduled for 08/17/22 '@10'$ :30 AM    Encounter Outcome:  Pt. Visit Completed

## 2022-06-13 ENCOUNTER — Other Ambulatory Visit: Payer: Self-pay | Admitting: Nurse Practitioner

## 2022-06-14 ENCOUNTER — Other Ambulatory Visit: Payer: Self-pay | Admitting: Nurse Practitioner

## 2022-06-14 DIAGNOSIS — E785 Hyperlipidemia, unspecified: Secondary | ICD-10-CM

## 2022-08-01 ENCOUNTER — Other Ambulatory Visit (HOSPITAL_BASED_OUTPATIENT_CLINIC_OR_DEPARTMENT_OTHER): Payer: Self-pay

## 2022-08-01 ENCOUNTER — Other Ambulatory Visit: Payer: Self-pay

## 2022-08-09 ENCOUNTER — Other Ambulatory Visit: Payer: Medicare Other

## 2022-08-09 DIAGNOSIS — E1140 Type 2 diabetes mellitus with diabetic neuropathy, unspecified: Secondary | ICD-10-CM

## 2022-08-09 DIAGNOSIS — I10 Essential (primary) hypertension: Secondary | ICD-10-CM

## 2022-08-09 DIAGNOSIS — E785 Hyperlipidemia, unspecified: Secondary | ICD-10-CM | POA: Diagnosis not present

## 2022-08-10 LAB — CBC WITH DIFFERENTIAL/PLATELET
Absolute Monocytes: 319 cells/uL (ref 200–950)
Basophils Absolute: 51 cells/uL (ref 0–200)
Basophils Relative: 0.9 %
Eosinophils Absolute: 103 cells/uL (ref 15–500)
Eosinophils Relative: 1.8 %
HCT: 42.5 % (ref 35.0–45.0)
Hemoglobin: 13.9 g/dL (ref 11.7–15.5)
Lymphs Abs: 2217.3 cells/uL (ref 850–3900)
MCH: 28.3 pg (ref 27.0–33.0)
MCHC: 32.7 g/dL (ref 32.0–36.0)
MCV: 86.6 fL (ref 80.0–100.0)
MPV: 9.7 fL (ref 7.5–12.5)
Monocytes Relative: 5.6 %
Neutro Abs: 3010 cells/uL (ref 1500–7800)
Neutrophils Relative %: 52.8 %
Platelets: 274 10*3/uL (ref 140–400)
RBC: 4.91 10*6/uL (ref 3.80–5.10)
RDW: 12.5 % (ref 11.0–15.0)
Total Lymphocyte: 38.9 %
WBC: 5.7 10*3/uL (ref 3.8–10.8)

## 2022-08-10 LAB — LIPID PANEL
Cholesterol: 119 mg/dL (ref <200)
HDL: 50 mg/dL (ref >=50)
LDL Cholesterol (Calc): 49 mg/dL (calc)
Non-HDL Cholesterol (Calc): 69 mg/dL (calc) (ref <130)
Total CHOL/HDL Ratio: 2.4 (calc) (ref <5.0)
Triglycerides: 118 mg/dL (ref <150)

## 2022-08-10 LAB — COMPLETE METABOLIC PANEL WITH GFR
AG Ratio: 1.7 (calc) (ref 1.0–2.5)
ALT: 16 U/L (ref 6–29)
AST: 20 U/L (ref 10–35)
Albumin: 4.3 g/dL (ref 3.6–5.1)
Alkaline phosphatase (APISO): 44 U/L (ref 37–153)
BUN/Creatinine Ratio: 16 (calc) (ref 6–22)
BUN: 23 mg/dL (ref 7–25)
CO2: 23 mmol/L (ref 20–32)
Calcium: 9.6 mg/dL (ref 8.6–10.4)
Chloride: 104 mmol/L (ref 98–110)
Creat: 1.4 mg/dL — ABNORMAL HIGH (ref 0.60–1.00)
Globulin: 2.6 g/dL (calc) (ref 1.9–3.7)
Glucose, Bld: 133 mg/dL — ABNORMAL HIGH (ref 65–99)
Potassium: 4.5 mmol/L (ref 3.5–5.3)
Sodium: 139 mmol/L (ref 135–146)
Total Bilirubin: 0.3 mg/dL (ref 0.2–1.2)
Total Protein: 6.9 g/dL (ref 6.1–8.1)
eGFR: 40 mL/min/{1.73_m2} — ABNORMAL LOW (ref >=60)

## 2022-08-10 LAB — HEMOGLOBIN A1C
Hgb A1c MFr Bld: 6.9 % of total Hgb — ABNORMAL HIGH (ref <5.7)
Mean Plasma Glucose: 151 mg/dL
eAG (mmol/L): 8.4 mmol/L

## 2022-08-12 ENCOUNTER — Encounter: Payer: Self-pay | Admitting: Nurse Practitioner

## 2022-08-15 ENCOUNTER — Encounter: Payer: Self-pay | Admitting: Nurse Practitioner

## 2022-08-15 ENCOUNTER — Ambulatory Visit (INDEPENDENT_AMBULATORY_CARE_PROVIDER_SITE_OTHER): Payer: Medicare Other | Admitting: Nurse Practitioner

## 2022-08-15 VITALS — BP 120/60 | HR 78 | Temp 97.5°F | Ht 64.0 in | Wt 223.4 lb

## 2022-08-15 DIAGNOSIS — E210 Primary hyperparathyroidism: Secondary | ICD-10-CM

## 2022-08-15 DIAGNOSIS — I10 Essential (primary) hypertension: Secondary | ICD-10-CM

## 2022-08-15 DIAGNOSIS — F339 Major depressive disorder, recurrent, unspecified: Secondary | ICD-10-CM

## 2022-08-15 DIAGNOSIS — N1832 Chronic kidney disease, stage 3b: Secondary | ICD-10-CM | POA: Diagnosis not present

## 2022-08-15 DIAGNOSIS — E785 Hyperlipidemia, unspecified: Secondary | ICD-10-CM

## 2022-08-15 DIAGNOSIS — E1140 Type 2 diabetes mellitus with diabetic neuropathy, unspecified: Secondary | ICD-10-CM

## 2022-08-15 NOTE — Progress Notes (Signed)
Careteam: Patient Care Team: Jill Garza, Jill [place] K, NP as PCP - General (Geriatric Medicine) Jill Garza, James, MD as PCP - Cardiology (Cardiology) Jill Garza, Jill BlossomAndrew Julian, MD as Consulting Physician (Ophthalmology) Jill DukeLittle, Jill LewAngel L, RN as [facility] Care Management  PLACE OF SERVICE:  [facility]  Advanced Directive information    Allergies  Allergen Reactions   Penicillins     Rash    Ivp Dye [Iodinated Contrast Media]     Hives    Percocet [Oxycodone-Acetaminophen]     Itching     Chief Complaint  Patient presents with   Medical Management of Chronic Issues    6 month follow-up and foot exam, Discuss need for additional covid boosters or post pone if patient refuses or is not a candidate. NCIR verified      HPI: Patient is a 72 y.o. female for routine follow up.  Her husband had a stroke in January and he is helping him.   DM- A1c elevated- she does not miss her meds.   Htn- well controlled   Hyperlipidemia- on crestor.  Reports she gets 3-4 UTIs a year.   CKD- followed by nephrology   Review of Systems:  Review of Systems  Constitutional:  Negative for chills, fever and weight loss.  HENT:  Negative for tinnitus.   Respiratory:  Negative for cough, sputum production and shortness of breath.   Cardiovascular:  Negative for chest pain, palpitations and leg swelling.  Gastrointestinal:  Negative for abdominal pain, constipation, diarrhea and heartburn.  Genitourinary:  Negative for dysuria, frequency and urgency.  Musculoskeletal:  Negative for back pain, falls, joint pain and myalgias.  Skin: Negative.   Neurological:  Negative for dizziness and headaches.  Psychiatric/Behavioral:  Negative for depression and memory loss. The patient does not have insomnia.     Past Medical History:  Diagnosis Date   Arthritis    Cancer    melanoma- right leg   Chronic kidney disease    Depression    GERD (gastroesophageal reflux disease)    pt denies     History of hiatal hernia    Hypercholesterolemia    Hyperparathyroidism    Hypertension    Sweat, sweating, excessive    Type 2 diabetes mellitus    Past Surgical History:  Procedure Laterality Date   ABDOMINAL HYSTERECTOMY  1979   APPENDECTOMY  1964   BREAST EXCISIONAL BIOPSY Left 08/2014   benign   BREAST LUMPECTOMY WITH RADIOACTIVE SEED LOCALIZATION Left 12/31/2014   Procedure: LEFT BREAST LUMPECTOMY WITH RADIOACTIVE SEED LOCALIZATION;  Surgeon: Manus RuddMatthew Tsuei, MD;  Location: [facility];  Service: General;  Laterality: Left;   CARPAL TUNNEL RELEASE Right 1977   CARPAL TUNNEL RELEASE Left 2007   CATARACT EXTRACTION Left 04/13/2014   Dr. Elmer PickerHecker   CATARACT EXTRACTION Right 06/10/2014   Dr. Elmer PickerHecker   CHOLECYSTECTOMY N/A 07/14/2017   Procedure: LAPAROSCOPIC CHOLECYSTECTOMY;  Surgeon: Andria MeuseWhite, Christopher M, MD;  Location: [facility];  Service: General;  Laterality: N/A;   COLONOSCOPY  2009   JOINT REPLACEMENT     MELANOMA EXCISION     PARATHYROIDECTOMY N/A 09/10/2020   Procedure: PARATHYROIDECTOMY;  Surgeon: Darnell LevelGerkin, Todd, MD;  Location: [facility];  Service: General;  Laterality: N/A;   THYROID EXPLORATION N/A 09/10/2020   Procedure: POSSIBLE NECK EXPLORATION;  Surgeon: Darnell LevelGerkin, Todd, MD;  Location: [facility];  Service: General;  Laterality: N/A;   TONSILLECTOMY  1962   TOTAL KNEE ARTHROPLASTY Left 2012   TOTAL KNEE ARTHROPLASTY Right  2008   Social History:   reports that she quit smoking about 36 years ago. Her smoking use included cigarettes. She has a 15.00 pack-year smoking history. She has never used smokeless tobacco. She reports current alcohol use. She reports that she does not use drugs.  Family History  Problem Relation Age of Onset   Pulmonary embolism Mother        Multiple, B/Garza    Heart attack Father        Died of MI age 11   Hypertension Sister    Diabetes type II Sister    High Cholesterol Sister    Deep vein thrombosis Sister    Hypertension Brother     Heart disease Brother 17   Diabetes Brother    High Cholesterol Brother    Hypertension Brother    High Cholesterol Brother    Alcohol abuse Brother    Deep vein thrombosis Brother    Atrial fibrillation Brother    Hepatitis Brother    Cancer Maternal Grandmother 81   Reye's syndrome Daughter    Stroke Other    Colon cancer Neg Hx    Esophageal cancer Neg Hx    Stomach cancer Neg Hx    Rectal cancer Neg Hx     Medications: Patient's Medications  New Prescriptions   No medications on file  Previous Medications   AMLODIPINE (NORVASC) 5 MG TABLET    Take 1 tablet by mouth once daily   CLINDAMYCIN (CLEOCIN) 300 MG CAPSULE    4 tablets prior to dental work   FENOFIBRATE (TRICOR) 145 MG TABLET    TAKE ONE TABLET BY MOUTH ONE TIME DAILY   GLIPIZIDE (GLUCOTROL) 5 MG TABLET    TAKE 1 TABLET BY MOUTH TWICE DAILY BEFORE MEAL(S)   LOSARTAN (COZAAR) 100 MG TABLET    Take 1 tablet by mouth once daily   OMEPRAZOLE (PRILOSEC) 20 MG CAPSULE    Take 1 capsule by mouth once daily   PAROXETINE (PAXIL) 30 MG TABLET    Take 1 tablet by mouth once daily   ROSUVASTATIN (CRESTOR) 5 MG TABLET    TAKE 1 TABLET BY MOUTH THREE TIMES A WEEK  Modified Medications   No medications on file  Discontinued Medications   No medications on file    Physical Exam:  Vitals:   08/15/22 0947  BP: 120/60  Pulse: 78  Temp: (!) 97.5 F (36.4 C)  TempSrc: Temporal  SpO2: 96%  Weight: 223 lb 6.4 oz (101.3 kg)  Height: 5\' 4"  (1.626 m)   Body mass index is 38.35 kg/m. Wt Readings from Last 3 Encounters:  08/15/22 223 lb 6.4 oz (101.3 kg)  02/09/22 222 lb (100.7 kg)  08/06/21 214 lb (97.1 kg)    Physical Exam Constitutional:      General: She is not in acute distress.    Appearance: She is well-developed. She is not diaphoretic.  HENT:     Head: Normocephalic and atraumatic.     Mouth/Throat:     Pharynx: No oropharyngeal exudate.  Eyes:     Conjunctiva/sclera: Conjunctivae normal.     Pupils:  Pupils are equal, round, and reactive to light.  Cardiovascular:     Rate and Rhythm: Normal rate and regular rhythm.     Heart sounds: Normal heart sounds.  Pulmonary:     Effort: Pulmonary effort is normal.     Breath sounds: Normal breath sounds.  Abdominal:     General: Bowel sounds are normal.  Palpations: Abdomen is soft.  Musculoskeletal:     Cervical back: Normal range of motion and neck supple.     Right lower leg: No edema.     Left lower leg: No edema.  Skin:    General: Skin is warm and dry.  Neurological:     Mental Status: She is alert and oriented to person, place, and time.  Psychiatric:        Mood and Affect: Mood normal.     Labs reviewed: Basic Metabolic Panel: Recent Labs    02/02/22 0849 08/09/22 0824  NA 139 139  Garza 5.1 4.5  CL 103 104  CO2 25 23  GLUCOSE 133* 133*  BUN 27* 23  CREATININE 1.51* 1.40*  CALCIUM 9.9 9.6   Liver Function Tests: Recent Labs    02/02/22 0849 08/09/22 0824  AST 18 20  ALT 17 16  BILITOT 0.4 0.3  PROT 7.2 6.9   No results for input(s): "LIPASE", "AMYLASE" in the last 8760 hours. No results for input(s): "AMMONIA" in the last 8760 hours. CBC: Recent Labs    08/09/22 0824  WBC 5.7  NEUTROABS 3,010  HGB 13.9  HCT 42.5  MCV 86.6  PLT 274   Lipid Panel: Recent Labs    08/09/22 0824  CHOL 119  HDL 50  LDLCALC 49  TRIG 118  CHOLHDL 2.4   TSH: No results for input(s): "TSH" in the last 8760 hours. A1C: Lab Results  Component Value Date   HGBA1C 6.9 (H) 08/09/2022     Assessment/Plan 1. Type 2 diabetes mellitus with diabetic neuropathy, without long-term current use of insulin -Encouraged dietary compliance, routine foot care/monitoring and to keep up with diabetic eye exams through ophthalmology  -continue current medication regimen - Microalbumin / creatinine urine ratio - Hemoglobin A1c; Future  2. Morbid obesity --education provided on healthy weight loss through increase in physical  activity and proper nutrition   3. Essential hypertension, benign -Blood pressure well controlled, goal bp <140/90 Continue current medications and dietary modifications follow metabolic panel - CBC with Differential/Platelet; Future - COMPLETE METABOLIC PANEL WITH GFR; Future  4. Hyperlipidemia LDL goal <70 -continue on tricor with crestor and dietary modification - COMPLETE METABOLIC PANEL WITH GFR; Future - Lipid panel; Future  5. Primary hyperparathyroidism -s/p parathyroidectomy - COMPLETE METABOLIC PANEL WITH GFR; Future  6. Stage 3b chronic kidney disease -Chronic and stable Encourage proper hydration Follow metabolic panel Avoid nephrotoxic meds (NSAIDS)  7. Recurrent major depressive disorder, remission status unspecified -stable on paxil.   Return in about 6 months (around 02/14/2023) for routine follow up .  Jill Garza. Biagio Borg [facility] [phone]

## 2022-08-17 ENCOUNTER — Ambulatory Visit: Payer: Self-pay

## 2022-08-17 NOTE — Patient Outreach (Signed)
  Care Coordination   08/17/2022 Name: Jill Garza MRN: [id number] DOB: Apr 16, 1951   Care Coordination Outreach Attempts:  An unsuccessful telephone outreach was attempted for a scheduled appointment today.  Follow Up Plan:  Additional outreach attempts will be made to offer the patient care coordination information and services.   Encounter Outcome:  No Answer   Care Coordination Interventions:  No, not indicated    Delsa Sale, RN, BSN, CCM Care Management Coordinator [facility] Care Management Direct Phone: [phone]

## 2022-09-21 ENCOUNTER — Ambulatory Visit: Payer: Self-pay

## 2022-09-21 NOTE — Patient Instructions (Signed)
Visit Information  Thank you for taking time to visit with me today. Please don't hesitate to contact me if I can be of assistance to you.   Following are the goals we discussed today:   Goals Addressed             This Visit's Progress    To maintain and or improve A1c       Care Coordination Interventions: Provided education to patient about basic DM disease process Provided patient with written educational materials related to hypo and hyperglycemia and importance of correct treatment Review of patient status, including review of consultants reports, relevant laboratory and other test results, and medications completed Counseled on Diabetic diet, my plate method, 161 minutes of moderate intensity exercise/week Educated patient about the PREP program, mailed printed PREP brochure, patient will consider for the future        To manage CKD       Care Coordination Interventions: Assessed the Patient understanding of chronic kidney disease    Evaluation of current treatment plan related to chronic kidney disease self management and patient's adherence to plan as established by provider      Last practice recorded BP readings:  BP Readings from Last 3 Encounters:  08/15/22 120/60  02/09/22 134/82  08/06/21 126/70  Most recent eGFR/CrCl:  Lab Results  Component Value Date   EGFR 40 (L) 08/09/2022    No components found for: "CRCL"          Our next appointment is by telephone on 11/21/22 at 09:30 AM  Please call the care guide team at [phone] if you need to cancel or reschedule your appointment.   If you are experiencing a Mental Health or Behavioral Health Crisis or need someone to talk to, please call 1-800-273-TALK (toll free, 24 hour hotline) go to [facility] [address], [place] [phone])  Patient verbalizes understanding of instructions and care plan provided today and agrees to view in MyChart. Active MyChart  status and patient understanding of how to access instructions and care plan via MyChart confirmed with patient.     Delsa Sale, RN, BSN, CCM Care Management Coordinator [facility] Care Management  Direct Phone: [phone]

## 2022-09-21 NOTE — Patient Outreach (Signed)
  Care Coordination   Follow Up Visit Note   09/21/2022 Name: Jill Garza MRN: [id number] DOB: Jan 08, 1951  Jill Garza is a 72 y.o. year old female who sees Janyth Contes, Janene Harvey, NP for primary care. I spoke with  Elisabeth Most Gott by phone today.  What matters to the patients health and wellness today?  Patient would like to lower her A1c. She will review the PREP brochure and consider for the future.     Goals Addressed             This Visit's Progress    To maintain and or improve A1c       Care Coordination Interventions: Provided education to patient about basic DM disease process Provided patient with written educational materials related to hypo and hyperglycemia and importance of correct treatment Review of patient status, including review of consultants reports, relevant laboratory and other test results, and medications completed Counseled on Diabetic diet, my plate method, 409 minutes of moderate intensity exercise/week Educated patient about the PREP program, mailed printed PREP brochure, patient will consider for the future     To manage CKD       Care Coordination Interventions: Assessed the Patient understanding of chronic kidney disease    Evaluation of current treatment plan related to chronic kidney disease self management and patient's adherence to plan as established by provider      Last practice recorded BP readings:  BP Readings from Last 3 Encounters:  08/15/22 120/60  02/09/22 134/82  08/06/21 126/70  Most recent eGFR/CrCl:  Lab Results  Component Value Date   EGFR 40 (L) 08/09/2022    No components found for: "CRCL"    Interventions Today    Flowsheet Row Most Recent Value  Chronic Disease   Chronic disease during today's visit Diabetes  General Interventions   General Interventions Discussed/Reviewed General Interventions Discussed, General Interventions Reviewed, Labs, Doctor Visits  Doctor Visits Discussed/Reviewed Doctor  Visits Discussed, Doctor Visits Reviewed, PCP  Exercise Interventions   Exercise Discussed/Reviewed Exercise Discussed, Exercise Reviewed, Physical Activity  Physical Activity Discussed/Reviewed Physical Activity Discussed, Physical Activity Reviewed, PREP  Education Interventions   Education Provided Provided Education, Provided Printed Education  Provided Verbal Education On Labs, When to see the doctor  Labs Reviewed Kidney Function, Hgb A1c  Nutrition Interventions   Nutrition Discussed/Reviewed Nutrition Discussed, Nutrition Reviewed, Carbohydrate meal planning, Portion sizes          SDOH assessments and interventions completed:  No     Care Coordination Interventions:  Yes, provided   Follow up plan: Follow up call scheduled for 11/21/22 @09 :30 AM    Encounter Outcome:  Pt. Visit Completed

## 2022-09-27 ENCOUNTER — Other Ambulatory Visit: Payer: Self-pay | Admitting: Nurse Practitioner

## 2022-11-21 ENCOUNTER — Ambulatory Visit: Payer: Self-pay

## 2022-11-21 NOTE — Patient Outreach (Signed)
  Care Coordination   Follow Up Visit Note   11/21/2022 Name: Jill Garza MRN: [id number] DOB: 03-24-1951  Jill Garza is a 72 y.o. year old female who sees Janyth Contes, Janene Harvey, NP for primary care. I spoke with  Elisabeth Most Cannedy by phone today.  What matters to the patients health and wellness today?  Patient would like to continue limiting her carbohydrates and increasing her daily water intake.     Goals Addressed             This Visit's Progress    To maintain and or improve A1c       Care Coordination Interventions: Provided education to patient about basic DM disease process Review of patient status, including review of consultants reports, relevant laboratory and other test results, and medications completed Advised patient, providing education and rationale, to check cbg twice daily before meals and record, calling PCP for findings outside established parameters Confirmed patient received the PREP brochure, patient educated on the referral process and she may consider for the future Confirmed patient is spot checking cbg's with good readings, FBS <130, she is limiting her carbohydrates Reviewed next scheduled PCP office visit with Abbey Chatters NP set for 02/14/23, labs and 02/17/23 in person with Shanda Bumps NP Assessed patient's understanding of A1c goal: <7% Lab Results  Component Value Date   HGBA1C 6.9 (H) 08/09/2022       To manage CKD       Care Coordination Interventions: Assessed the Patient understanding of chronic kidney disease    Evaluation of current treatment plan related to chronic kidney disease self management and patient's adherence to plan as established by provider      Reviewed prescribed diet increase daily water intake to 48-64 oz daily unless otherwise directed  Provided education on kidney disease progression    Last practice recorded BP readings:  BP Readings from Last 3 Encounters:  08/15/22 120/60  02/09/22 134/82  08/06/21  126/70   Most recent eGFR/CrCl:  Lab Results  Component Value Date   EGFR 40 (L) 08/09/2022    No components found for: "CRCL"     Interventions Today    Flowsheet Row Most Recent Value  Chronic Disease   Chronic disease during today's visit Diabetes, Chronic Kidney Disease/End Stage Renal Disease (ESRD)  General Interventions   General Interventions Discussed/Reviewed General Interventions Discussed, General Interventions Reviewed, Labs, Doctor Visits, Durable Medical Equipment (DME)  Doctor Visits Discussed/Reviewed PCP, Doctor Visits Reviewed, Doctor Visits Discussed  Durable Medical Equipment (DME) Glucomoter  Exercise Interventions   Exercise Discussed/Reviewed Physical Activity, Exercise Reviewed, Exercise Discussed  Physical Activity Discussed/Reviewed PREP, Physical Activity Reviewed, Physical Activity Discussed  Education Interventions   Education Provided Provided Education  Provided Verbal Education On Blood Sugar Monitoring, Labs, Nutrition, When to see the doctor, Exercise  Labs Reviewed Kidney Function, Hgb A1c  Nutrition Interventions   Nutrition Discussed/Reviewed Nutrition Discussed, Fluid intake, Nutrition Reviewed, Carbohydrate meal planning          SDOH assessments and interventions completed:  No     Care Coordination Interventions:  Yes, provided   Follow up plan: Follow up call scheduled for 02/21/23 @09 :30 AM    Encounter Outcome:  Pt. Visit Completed

## 2022-11-21 NOTE — Patient Instructions (Signed)
Visit Information  Thank you for taking time to visit with me today. Please don't hesitate to contact me if I can be of assistance to you.   Following are the goals we discussed today:   Goals Addressed             This Visit's Progress    To maintain and or improve A1c       Care Coordination Interventions: Provided education to patient about basic DM disease process Review of patient status, including review of consultants reports, relevant laboratory and other test results, and medications completed Advised patient, providing education and rationale, to check cbg twice daily before meals and record, calling PCP for findings outside established parameters Confirmed patient received the PREP brochure, patient educated on the referral process and she may consider for the future Confirmed patient is spot checking cbg's with good readings, FBS <130, she is limiting her carbohydrates Reviewed next scheduled PCP office visit with Abbey Chatters NP set for 02/14/23, labs and 02/17/23 in person with Shanda Bumps NP Assessed patient's understanding of A1c goal: <7% Lab Results  Component Value Date   HGBA1C 6.9 (H) 08/09/2022           To manage CKD       Care Coordination Interventions: Assessed the Patient understanding of chronic kidney disease    Evaluation of current treatment plan related to chronic kidney disease self management and patient's adherence to plan as established by provider      Reviewed prescribed diet increase daily water intake to 48-64 oz daily unless otherwise directed  Provided education on kidney disease progression    Last practice recorded BP readings:  BP Readings from Last 3 Encounters:  08/15/22 120/60  02/09/22 134/82  08/06/21 126/70   Most recent eGFR/CrCl:  Lab Results  Component Value Date   EGFR 40 (L) 08/09/2022    No components found for: "CRCL"          Our next appointment is by telephone on 02/21/23 at 09:30 AM  Please call the  care guide team at [phone] if you need to cancel or reschedule your appointment.   If you are experiencing a Mental Health or Behavioral Health Crisis or need someone to talk to, please call 1-800-273-TALK (toll free, 24 hour hotline)  Patient verbalizes understanding of instructions and care plan provided today and agrees to view in MyChart. Active MyChart status and patient understanding of how to access instructions and care plan via MyChart confirmed with patient.     Delsa Sale, RN, BSN, CCM Care Management Coordinator [facility] Care Management  Direct Phone: [phone]

## 2022-11-30 ENCOUNTER — Other Ambulatory Visit: Payer: Self-pay | Admitting: Nurse Practitioner

## 2022-11-30 DIAGNOSIS — E785 Hyperlipidemia, unspecified: Secondary | ICD-10-CM

## 2022-12-06 ENCOUNTER — Other Ambulatory Visit: Payer: Self-pay | Admitting: Nurse Practitioner

## 2022-12-06 NOTE — Telephone Encounter (Signed)
Patient medication Paxil has warnings. Medication pend and sent to PCP Janyth Contes Janene Harvey, NP

## 2023-01-05 DIAGNOSIS — Z85820 Personal history of malignant melanoma of skin: Secondary | ICD-10-CM | POA: Diagnosis not present

## 2023-01-05 DIAGNOSIS — Z08 Encounter for follow-up examination after completed treatment for malignant neoplasm: Secondary | ICD-10-CM | POA: Diagnosis not present

## 2023-01-05 DIAGNOSIS — D225 Melanocytic nevi of trunk: Secondary | ICD-10-CM | POA: Diagnosis not present

## 2023-01-05 DIAGNOSIS — D2271 Melanocytic nevi of right lower limb, including hip: Secondary | ICD-10-CM | POA: Diagnosis not present

## 2023-01-05 DIAGNOSIS — L821 Other seborrheic keratosis: Secondary | ICD-10-CM | POA: Diagnosis not present

## 2023-01-05 DIAGNOSIS — L309 Dermatitis, unspecified: Secondary | ICD-10-CM | POA: Diagnosis not present

## 2023-01-05 DIAGNOSIS — L814 Other melanin hyperpigmentation: Secondary | ICD-10-CM | POA: Diagnosis not present

## 2023-01-16 ENCOUNTER — Other Ambulatory Visit: Payer: Self-pay | Admitting: Nurse Practitioner

## 2023-01-16 DIAGNOSIS — E785 Hyperlipidemia, unspecified: Secondary | ICD-10-CM

## 2023-02-14 ENCOUNTER — Other Ambulatory Visit: Payer: Medicare Other

## 2023-02-14 ENCOUNTER — Telehealth: Payer: Self-pay

## 2023-02-14 ENCOUNTER — Other Ambulatory Visit: Payer: Self-pay | Admitting: Nurse Practitioner

## 2023-02-14 DIAGNOSIS — E210 Primary hyperparathyroidism: Secondary | ICD-10-CM

## 2023-02-14 DIAGNOSIS — I10 Essential (primary) hypertension: Secondary | ICD-10-CM

## 2023-02-14 DIAGNOSIS — Z1231 Encounter for screening mammogram for malignant neoplasm of breast: Secondary | ICD-10-CM

## 2023-02-14 DIAGNOSIS — E1140 Type 2 diabetes mellitus with diabetic neuropathy, unspecified: Secondary | ICD-10-CM

## 2023-02-14 DIAGNOSIS — E785 Hyperlipidemia, unspecified: Secondary | ICD-10-CM

## 2023-02-14 NOTE — Telephone Encounter (Signed)
Per Sharon Seller, NP patient can be offered a video visit with her, we have openings.

## 2023-02-14 NOTE — Telephone Encounter (Signed)
I called patient back and she states that she refused video visit earlier because 9am wasn't a good time. She is able to do video visit at 11am. However urine specimen was thrown away per patient request earlier when I offered 9am visit and she stated "Ill just go to Urgent Care Instead."  Patient states that she will just wait until visit Friday 02/17/2023 with PCP Janyth Contes, Janene Harvey, NP to give urine specimen and address concerns.

## 2023-02-14 NOTE — Telephone Encounter (Signed)
Please Advise

## 2023-02-14 NOTE — Telephone Encounter (Addendum)
Late message. Patient came into office and told lab tech Beth that she's been having dysuria. Beth told her to leave a urine sample. I called patient at 8:30am  and notified her that she would have to be seen. I offered her video visit with Richarda Blade, NP at 9am  Patient refused and said she was trying to be nice by seeing PCP Janyth Contes, Janene Harvey, NP but since we cannot just place order and test urine she will just go to urgent care and to toss urine specimen out. Message routed to PCP Janyth Contes, Janene Harvey, NP and Team Lead [email or web address] as FYI.

## 2023-02-14 NOTE — Telephone Encounter (Deleted)
Sharon Seller, NP  You19 minutes ago (9:31 AM)    You can her a virtual visit with me

## 2023-02-14 NOTE — Telephone Encounter (Signed)
Noted thank you

## 2023-02-14 NOTE — Telephone Encounter (Signed)
You can her a virtual visit with me

## 2023-02-15 LAB — CBC WITH DIFFERENTIAL/PLATELET
Absolute Monocytes: 354 {cells}/uL (ref 200–950)
Basophils Absolute: 52 {cells}/uL (ref 0–200)
Basophils Relative: 0.9 %
Eosinophils Absolute: 122 {cells}/uL (ref 15–500)
Eosinophils Relative: 2.1 %
HCT: 43.8 % (ref 35.0–45.0)
Hemoglobin: 13.8 g/dL (ref 11.7–15.5)
Lymphs Abs: 2366 {cells}/uL (ref 850–3900)
MCH: 28.5 pg (ref 27.0–33.0)
MCHC: 31.5 g/dL — ABNORMAL LOW (ref 32.0–36.0)
MCV: 90.3 fL (ref 80.0–100.0)
MPV: 9.7 fL (ref 7.5–12.5)
Monocytes Relative: 6.1 %
Neutro Abs: 2906 {cells}/uL (ref 1500–7800)
Neutrophils Relative %: 50.1 %
Platelets: 285 10*3/uL (ref 140–400)
RBC: 4.85 10*6/uL (ref 3.80–5.10)
RDW: 12.4 % (ref 11.0–15.0)
Total Lymphocyte: 40.8 %
WBC: 5.8 10*3/uL (ref 3.8–10.8)

## 2023-02-15 LAB — COMPLETE METABOLIC PANEL WITH GFR
AG Ratio: 1.7 (calc) (ref 1.0–2.5)
ALT: 16 U/L (ref 6–29)
AST: 16 U/L (ref 10–35)
Albumin: 4.3 g/dL (ref 3.6–5.1)
Alkaline phosphatase (APISO): 38 U/L (ref 37–153)
BUN/Creatinine Ratio: 19 (calc) (ref 6–22)
BUN: 24 mg/dL (ref 7–25)
CO2: 26 mmol/L (ref 20–32)
Calcium: 9.7 mg/dL (ref 8.6–10.4)
Chloride: 104 mmol/L (ref 98–110)
Creat: 1.29 mg/dL — ABNORMAL HIGH (ref 0.60–1.00)
Globulin: 2.6 g/dL (ref 1.9–3.7)
Glucose, Bld: 119 mg/dL — ABNORMAL HIGH (ref 65–99)
Potassium: 4.4 mmol/L (ref 3.5–5.3)
Sodium: 138 mmol/L (ref 135–146)
Total Bilirubin: 0.5 mg/dL (ref 0.2–1.2)
Total Protein: 6.9 g/dL (ref 6.1–8.1)
eGFR: 44 mL/min/{1.73_m2} — ABNORMAL LOW (ref >=60)

## 2023-02-15 LAB — LIPID PANEL
Cholesterol: 115 mg/dL (ref <200)
HDL: 42 mg/dL — ABNORMAL LOW (ref >=50)
LDL Cholesterol (Calc): 50 mg/dL
Non-HDL Cholesterol (Calc): 73 mg/dL (ref <130)
Total CHOL/HDL Ratio: 2.7 (calc) (ref <5.0)
Triglycerides: 150 mg/dL — ABNORMAL HIGH (ref <150)

## 2023-02-15 LAB — HEMOGLOBIN A1C
Hgb A1c MFr Bld: 7.1 %{Hb} — ABNORMAL HIGH (ref <5.7)
Mean Plasma Glucose: 157 mg/dL
eAG (mmol/L): 8.7 mmol/L

## 2023-02-17 ENCOUNTER — Ambulatory Visit: Payer: Medicare Other | Admitting: Nurse Practitioner

## 2023-02-17 ENCOUNTER — Ambulatory Visit
Admission: RE | Admit: 2023-02-17 | Discharge: 2023-02-17 | Disposition: A | Discharge status: Home / Self Care | Payer: Medicare Other | Source: Ambulatory Visit | Attending: Nurse Practitioner | Admitting: Nurse Practitioner

## 2023-02-17 ENCOUNTER — Encounter: Payer: Self-pay | Admitting: Nurse Practitioner

## 2023-02-17 VITALS — BP 120/72 | HR 88 | Temp 96.8°F | Ht 64.0 in | Wt 223.6 lb

## 2023-02-17 DIAGNOSIS — R053 Chronic cough: Secondary | ICD-10-CM | POA: Diagnosis not present

## 2023-02-17 DIAGNOSIS — Z23 Encounter for immunization: Secondary | ICD-10-CM

## 2023-02-17 DIAGNOSIS — Z1231 Encounter for screening mammogram for malignant neoplasm of breast: Secondary | ICD-10-CM

## 2023-02-17 DIAGNOSIS — N368 Other specified disorders of urethra: Secondary | ICD-10-CM | POA: Diagnosis not present

## 2023-02-17 DIAGNOSIS — E210 Primary hyperparathyroidism: Secondary | ICD-10-CM

## 2023-02-17 DIAGNOSIS — F339 Major depressive disorder, recurrent, unspecified: Secondary | ICD-10-CM | POA: Diagnosis not present

## 2023-02-17 DIAGNOSIS — E785 Hyperlipidemia, unspecified: Secondary | ICD-10-CM | POA: Diagnosis not present

## 2023-02-17 DIAGNOSIS — E1140 Type 2 diabetes mellitus with diabetic neuropathy, unspecified: Secondary | ICD-10-CM | POA: Diagnosis not present

## 2023-02-17 DIAGNOSIS — I10 Essential (primary) hypertension: Secondary | ICD-10-CM | POA: Diagnosis not present

## 2023-02-17 DIAGNOSIS — N1832 Chronic kidney disease, stage 3b: Secondary | ICD-10-CM

## 2023-02-17 NOTE — Patient Instructions (Signed)
You can get chest xray at [facility] at anytime- this is a walk in If this is normal will send to pulmonary for ongoing evaluation of cough  Try vaginal lubricant as needed for irration.

## 2023-02-17 NOTE — Progress Notes (Signed)
Careteam: Patient Care Team: Sharon Seller, NP as PCP - General (Geriatric Medicine) Rollene Rotunda, MD as PCP - Cardiology (Cardiology) Genia Del, Daisy Blossom, MD as Consulting Physician (Ophthalmology) Clarene Duke Karma Lew, RN as [facility] Care Management Bufford Buttner, MD as Consulting Physician (Nephrology)  PLACE OF SERVICE:  [facility]  Advanced Directive information    Allergies  Allergen Reactions   Penicillins     Rash    Ivp Dye [Iodinated Contrast Media]     Hives    Percocet [Oxycodone-Acetaminophen]     Itching     Chief Complaint  Patient presents with   Medical Management of Chronic Issues    Patient presents today for a 6 month follow-up   Quality Metric Gaps    Eye exam (02/20/23), COVID#7, Foot exam     HPI: Patient is a 72 y.o. female presents for 6 month-follow up.  Labs reviewed. Elevated A1c and triglycerides.  Patient states she had been eating pasta and ice cream often but has tried to cut back recently. She also eats rice cereal and bagels occasionally for breakfast but she normally only eats lunch/dinner. She has not been very active because she's been caring for her husband more. She does try to walk.   Hasn't seen nephrology, states they usually call her to make appointments, but she plans on calling today to see when she'll be seen.   Frequent urination, not painful, feels irritating. No low back pain, fever, or chills. Has been on/off for the last month. Trying to stay hydrated. Denies noticeable vaginal dryness.  States she has been coughing more, not related to food intake, has been going on for a couple months, exacerbated by "cold things". Has occasional coughing spells that get carried away. Reports wheezing often. Former heavy smoker but quit over 30 years ago. GERD controlled.  Mood is stable on paxil. Has ups/down but manages.  Eye exam on Monday.  Mammogram today.  Review of Systems:  Review of Systems   Constitutional:  Negative for chills, fever and weight loss.  HENT:  Positive for congestion.   Respiratory:  Negative for cough and shortness of breath.   Cardiovascular:  Positive for palpitations (occassional). Negative for chest pain and leg swelling.  Gastrointestinal:  Negative for constipation, diarrhea, nausea and vomiting.  Genitourinary:  Positive for frequency and urgency. Negative for dysuria and hematuria.  Musculoskeletal:  Negative for back pain and myalgias.  Neurological:  Negative for dizziness, weakness and headaches.  Psychiatric/Behavioral:  Negative for depression. The patient is not nervous/anxious and does not have insomnia.     Past Medical History:  Diagnosis Date   Arthritis    Cancer (HCC)    melanoma- right leg   Chronic kidney disease    Depression    GERD (gastroesophageal reflux disease)    pt denies    History of hiatal hernia    Hypercholesterolemia    Hyperparathyroidism (HCC)    Hypertension    Sweat, sweating, excessive    Type 2 diabetes mellitus (HCC)    Past Surgical History:  Procedure Laterality Date   ABDOMINAL HYSTERECTOMY  1979   APPENDECTOMY  1964   BREAST EXCISIONAL BIOPSY Left 08/2014   benign   BREAST LUMPECTOMY WITH RADIOACTIVE SEED LOCALIZATION Left 12/31/2014   Procedure: LEFT BREAST LUMPECTOMY WITH RADIOACTIVE SEED LOCALIZATION;  Surgeon: Manus Rudd, MD;  Location: [facility];  Service: General;  Laterality: Left;   CARPAL TUNNEL RELEASE Right 1977  CARPAL TUNNEL RELEASE Left 2007   CATARACT EXTRACTION Left 04/13/2014   Dr. Elmer Picker   CATARACT EXTRACTION Right 06/10/2014   Dr. Elmer Picker   CHOLECYSTECTOMY N/A 07/14/2017   Procedure: LAPAROSCOPIC CHOLECYSTECTOMY;  Surgeon: Andria Meuse, MD;  Location: [facility];  Service: General;  Laterality: N/A;   COLONOSCOPY  2009   JOINT REPLACEMENT     MELANOMA EXCISION     PARATHYROIDECTOMY N/A 09/10/2020   Procedure: PARATHYROIDECTOMY;  Surgeon: Darnell Level,  MD;  Location: [facility];  Service: General;  Laterality: N/A;   THYROID EXPLORATION N/A 09/10/2020   Procedure: POSSIBLE NECK EXPLORATION;  Surgeon: Darnell Level, MD;  Location: [facility];  Service: General;  Laterality: N/A;   TONSILLECTOMY  1962   TOTAL KNEE ARTHROPLASTY Left 2012   TOTAL KNEE ARTHROPLASTY Right 2008   Social History:   reports that she quit smoking about 36 years ago. Her smoking use included cigarettes. She started smoking about 51 years ago. She has a 15 pack-year smoking history. She has never used smokeless tobacco. She reports current alcohol use. She reports that she does not use drugs.  Family History  Problem Relation Age of Onset   Pulmonary embolism Mother        Multiple, B/L    Heart attack Father        Died of MI age 12   Hypertension Sister    Diabetes type II Sister    High Cholesterol Sister    Deep vein thrombosis Sister    Hypertension Brother    Heart disease Brother 53   Diabetes Brother    High Cholesterol Brother    Hypertension Brother    High Cholesterol Brother    Alcohol abuse Brother    Deep vein thrombosis Brother    Atrial fibrillation Brother    Hepatitis Brother    Cancer Maternal Grandmother 42   Reye's syndrome Daughter    Stroke Other    Colon cancer Neg Hx    Esophageal cancer Neg Hx    Stomach cancer Neg Hx    Rectal cancer Neg Hx     Medications: Patient's Medications  New Prescriptions   No medications on file  Previous Medications   AMLODIPINE (NORVASC) 5 MG TABLET    Take 1 tablet by mouth once daily   CLINDAMYCIN (CLEOCIN) 300 MG CAPSULE    4 tablets prior to dental work   FENOFIBRATE (TRICOR) 145 MG TABLET    TAKE ONE TABLET BY MOUTH ONE TIME DAILY   GLIPIZIDE (GLUCOTROL) 5 MG TABLET    TAKE 1 TABLET BY MOUTH TWICE DAILY BEFORE MEAL(S)   LOSARTAN (COZAAR) 100 MG TABLET    Take 1 tablet by mouth once daily   OMEPRAZOLE (PRILOSEC) 20 MG CAPSULE    Take 1 capsule by mouth once daily   PAROXETINE (PAXIL) 30 MG  TABLET    Take 1 tablet by mouth once daily   ROSUVASTATIN (CRESTOR) 5 MG TABLET    TAKE 1 TABLET BY MOUTH THREE TIMES A WEEK  Modified Medications   No medications on file  Discontinued Medications   No medications on file    Physical Exam:  Vitals:   02/17/23 0944  BP: 120/72  Pulse: 88  Temp: (!) 96.8 F (36 C)  SpO2: 97%  Weight: 101.4 kg  Height: 5\' 4"  (1.626 m)   Body mass index is 38.38 kg/m. Wt Readings from Last 3 Encounters:  02/17/23 223 lb 9.6 oz (101.4 kg)  08/15/22 223 lb  6.4 oz (101.3 kg)  02/09/22 222 lb (100.7 kg)    Physical Exam Constitutional:      Appearance: Normal appearance.  Cardiovascular:     Rate and Rhythm: Normal rate and regular rhythm.     Pulses: Normal pulses.     Heart sounds: Normal heart sounds.  Pulmonary:     Effort: Pulmonary effort is normal.     Breath sounds: Normal breath sounds.  Abdominal:     General: Bowel sounds are normal.     Palpations: Abdomen is soft.  Musculoskeletal:     Right lower leg: No edema.     Left lower leg: No edema.  Skin:    General: Skin is warm and dry.  Neurological:     General: No focal deficit present.     Mental Status: She is alert and oriented to person, place, and time. Mental status is at baseline.  Psychiatric:        Mood and Affect: Mood normal.        Behavior: Behavior normal.    Labs reviewed: Basic Metabolic Panel: Recent Labs    08/09/22 0824 02/14/23 0803  NA 139 138  K 4.5 4.4  CL 104 104  CO2 23 26  GLUCOSE 133* 119*  BUN 23 24  CREATININE 1.40* 1.29*  CALCIUM 9.6 9.7   Liver Function Tests: Recent Labs    08/09/22 0824 02/14/23 0803  AST 20 16  ALT 16 16  BILITOT 0.3 0.5  PROT 6.9 6.9   No results for input(s): "LIPASE", "AMYLASE" in the last 8760 hours. No results for input(s): "AMMONIA" in the last 8760 hours. CBC: Recent Labs    08/09/22 0824 02/14/23 0803  WBC 5.7 5.8  NEUTROABS 3,010 2,906  HGB 13.9 13.8  HCT 42.5 43.8  MCV 86.6 90.3   PLT 274 285   Lipid Panel: Recent Labs    08/09/22 0824 02/14/23 0803  CHOL 119 115  HDL 50 42*  LDLCALC 49 50  TRIG 118 150*  CHOLHDL 2.4 2.7   TSH: No results for input(s): "TSH" in the last 8760 hours. A1C: Lab Results  Component Value Date   HGBA1C 7.1 (H) 02/14/2023     Assessment/Plan 1. Type 2 diabetes mellitus with diabetic neuropathy, without long-term current use of insulin (HCC) -Continue glipizide -Encouraged dietary modifications (decrease carbohydrate intake, cut back on sweets) and physical activity - Hemoglobin A1c; Future  2. Essential hypertension, benign -Controlled, at goal, <140/90 -Continue amlodipine and losartan -Encouraged dietary modifications/DASH diet and physical activity - COMPLETE METABOLIC PANEL WITH GFR; Future - CBC with Differential/Platelet; Future  3. Stage 3b chronic kidney disease (HCC) -Stable -Encouraged proper nutrition -Avoid nephrotoxic agents -Encouraged scheduling appointment with nephrology - COMPLETE METABOLIC PANEL WITH GFR; Future  4. Hyperlipidemia LDL goal <70 -Continue rosuvastatin -Encouraged dietary modifications and physical activity - Lipid panel; Future  5. Morbid obesity (HCC) -BMI >35 with diabetes, hypertension, hyperlipidemia, CKD -Encouraged dietary modifications and physical activity  6. Primary hyperparathyroidism (HCC) -s/p parathyroidectomy  7. Recurrent major depressive disorder, remission status unspecified (HCC) -Stable on paxil -Encouraged physical activity to help with mood  8. Chronic cough - DG Chest 2 View; Future If normal will send to pulmonary for further evaluation, she has hx of smoking.   9. Urethral irritation -Try OTC vaginal lubricant, irritation likely from dryness   10. Need for influenza vaccination - Flu Vaccine Trivalent High Dose (Fluad)  Return in about 6 months (around 08/18/2023) for routine follow up .  Rollen Sox, FNP-MSN Student -I  personally was present during the history, physical exam and medical decision-making activities of this service and have verified that the service and findings are accurately documented in the student's note Abbey Chatters, NP

## 2023-02-18 LAB — MICROALBUMIN / CREATININE URINE RATIO
Creatinine, Urine: 266 mg/dL (ref 20–275)
Microalb Creat Ratio: 24 mg/g{creat} (ref <30)
Microalb, Ur: 6.3 mg/dL

## 2023-02-21 ENCOUNTER — Ambulatory Visit: Payer: Self-pay

## 2023-02-21 NOTE — Patient Outreach (Signed)
Care Coordination   Follow Up Visit Note   02/21/2023 Name: Jill Garza MRN: [id number] DOB: [birth year]  Jill Garza is a 72 y.o. year old female who sees Janyth Contes, Janene Harvey, NP for primary care. I spoke with  Jill Garza by phone today.  What matters to the patients health and wellness today?  Patient would like to work on lowering her A1c and Triglycerides.     Goals Addressed             This Visit's Progress    To lower Triglycerides       Care Coordination Interventions: Provider established cholesterol goals reviewed Reviewed importance of limiting foods high in cholesterol Mailed printed educational materials related to Cholesterol management  Lipid Panel     Component Value Date/Time   CHOL 115 02/14/2023 0803   CHOL 177 09/02/2015 0846   TRIG 150 (H) 02/14/2023 0803   HDL 42 (L) 02/14/2023 0803   HDL 50 09/02/2015 0846   CHOLHDL 2.7 02/14/2023 0803   VLDL 33 (H) 10/19/2016 0828   LDLCALC 50 02/14/2023 0803   LABVLDL 30 09/02/2015 0846        To maintain and or improve A1c   On track    Care Coordination Interventions: Provided education to patient about basic DM disease process Provided patient with written educational materials related to hypo and hyperglycemia and importance of correct treatment Review of patient status, including review of consultants reports, relevant laboratory and other test results, and medications completed Counseled on Diabetic diet, my plate method, using the plate method Mailed printed educational materials related to Diabetes Management  Assessed patient's understanding of A1c goal: <7.0 % Lab Results  Component Value Date   HGBA1C 7.1 (H) 02/14/2023      Interventions Today    Flowsheet Row Most Recent Value  Chronic Disease   Chronic disease during today's visit Diabetes, Other  [cough, elevated triglycerides]  General Interventions   General Interventions Discussed/Reviewed General Interventions  Discussed, General Interventions Reviewed, Labs, Doctor Visits, Vaccines  Vaccines COVID-19, Flu  Doctor Visits Discussed/Reviewed Doctor Visits Discussed, Doctor Visits Reviewed, PCP  Education Interventions   Education Provided Provided Education, Provided Printed Education  Provided Verbal Education On Nutrition, Labs, Medication, When to see the doctor  Labs Reviewed Hgb A1c, Lipid Profile  Nutrition Interventions   Nutrition Discussed/Reviewed Nutrition Discussed, Nutrition Reviewed, Carbohydrate meal planning, Portion sizes          SDOH assessments and interventions completed:  No     Care Coordination Interventions:  Yes, provided   Follow up plan: Follow up call scheduled for 04/24/23 @10 :30 AM    Encounter Outcome:  Patient Visit Completed

## 2023-02-21 NOTE — Patient Instructions (Signed)
Visit Information  Thank you for taking time to visit with me today. Please don't hesitate to contact me if I can be of assistance to you.   Following are the goals we discussed today:   Goals Addressed             This Visit's Progress    To lower Triglycerides       Care Coordination Interventions: Provider established cholesterol goals reviewed Reviewed importance of limiting foods high in cholesterol Mailed printed educational materials related to Cholesterol management  Lipid Panel     Component Value Date/Time   CHOL 115 02/14/2023 0803   CHOL 177 09/02/2015 0846   TRIG 150 (H) 02/14/2023 0803   HDL 42 (L) 02/14/2023 0803   HDL 50 09/02/2015 0846   CHOLHDL 2.7 02/14/2023 0803   VLDL 33 (H) 10/19/2016 0828   LDLCALC 50 02/14/2023 0803   LABVLDL 30 09/02/2015 0846         To maintain and or improve A1c   On track    Care Coordination Interventions: Provided education to patient about basic DM disease process Provided patient with written educational materials related to hypo and hyperglycemia and importance of correct treatment Review of patient status, including review of consultants reports, relevant laboratory and other test results, and medications completed Counseled on Diabetic diet, my plate method, using the plate method Mailed printed educational materials related to Diabetes Management  Assessed patient's understanding of A1c goal: <7.0 % Lab Results  Component Value Date   HGBA1C 7.1 (H) 02/14/2023          Our next appointment is by telephone on 04/24/23 at 10:30 AM  Please call the care guide team at [phone] if you need to cancel or reschedule your appointment.   If you are experiencing a Mental Health or Behavioral Health Crisis or need someone to talk to, please call 1-800-273-TALK (toll free, 24 hour hotline)  Patient verbalizes understanding of instructions and care plan provided today and agrees to view in MyChart. Active MyChart  status and patient understanding of how to access instructions and care plan via MyChart confirmed with patient.     Delsa Sale RN BSN CCM [place]  [facility], [facility] Health Nurse Care Coordinator  Direct Dial: [phone] Website: [email or web address]

## 2023-02-24 ENCOUNTER — Other Ambulatory Visit: Payer: Self-pay | Admitting: Nurse Practitioner

## 2023-02-24 DIAGNOSIS — E785 Hyperlipidemia, unspecified: Secondary | ICD-10-CM

## 2023-02-27 DIAGNOSIS — E119 Type 2 diabetes mellitus without complications: Secondary | ICD-10-CM | POA: Diagnosis not present

## 2023-02-27 LAB — HM DIABETES EYE EXAM

## 2023-03-06 ENCOUNTER — Other Ambulatory Visit: Payer: Self-pay | Admitting: Nurse Practitioner

## 2023-03-06 DIAGNOSIS — K219 Gastro-esophageal reflux disease without esophagitis: Secondary | ICD-10-CM

## 2023-03-12 ENCOUNTER — Encounter: Payer: Self-pay | Admitting: Nurse Practitioner

## 2023-03-14 ENCOUNTER — Telehealth: Payer: Medicare Other

## 2023-03-14 NOTE — Telephone Encounter (Signed)
Thank you :)

## 2023-03-14 NOTE — Telephone Encounter (Signed)
An outgoing call was placed yesterday to [phone] per Sharon Seller, NP request, however I had to end call due to the call of action of working in office with a full schedule.  Outgoing call placed today to the radiology reading room, spoke with Liz Beach,  he will notify the radiologist of request,  and an addendum should populate in Epic, once report is re-read.

## 2023-03-14 NOTE — Telephone Encounter (Signed)
-----   Message from Sharon Seller sent at 03/13/2023 12:37 PM EST ----- Regarding: please follow up Can you call the radiologist department and ask for a re-read of her xray- impression says no acute process but under the findings it says tortuous aorta which is not consistent Thank you

## 2023-03-21 ENCOUNTER — Other Ambulatory Visit: Payer: Self-pay | Admitting: Nurse Practitioner

## 2023-03-21 DIAGNOSIS — I10 Essential (primary) hypertension: Secondary | ICD-10-CM

## 2023-03-23 ENCOUNTER — Encounter: Payer: Self-pay | Admitting: Nurse Practitioner

## 2023-03-23 NOTE — Telephone Encounter (Signed)
Care team updated and letter sent for eye exam notes.

## 2023-04-20 ENCOUNTER — Other Ambulatory Visit: Payer: Self-pay | Admitting: Nurse Practitioner

## 2023-04-20 DIAGNOSIS — I10 Essential (primary) hypertension: Secondary | ICD-10-CM

## 2023-04-24 ENCOUNTER — Ambulatory Visit: Payer: Self-pay

## 2023-04-24 NOTE — Patient Instructions (Signed)
Visit Information  Thank you for taking time to visit with me today. Please don't hesitate to contact me if I can be of assistance to you.   Following are the goals we discussed today:   Goals Addressed             This Visit's Progress    COMPLETED: To lower Triglycerides       Care Coordination Interventions: Provider established cholesterol goals reviewed Review of patient status, including review of consultant's reports, relevant laboratory and other test results, and medications completed Lipid Panel     Component Value Date/Time   CHOL 115 02/14/2023 0803   CHOL 177 09/02/2015 0846   TRIG 150 (H) 02/14/2023 0803   HDL 42 (L) 02/14/2023 0803   HDL 50 09/02/2015 0846   CHOLHDL 2.7 02/14/2023 0803   VLDL 33 (H) 10/19/2016 0828   LDLCALC 50 02/14/2023 0803   LABVLDL 30 09/02/2015 0846          COMPLETED: To maintain and or improve A1c       Care Coordination Interventions: Review of patient status, including review of consultants reports, relevant laboratory and other test results, and medications completed Counseled on Diabetic diet, my plate method, using the plate method Assessed patient's understanding of A1c goal: <7.0 % Lab Results  Component Value Date   HGBA1C 7.1 (H) 02/14/2023          COMPLETED: To manage CKD       Care Coordination Interventions: Assessed the Patient understanding of chronic kidney disease    Evaluation of current treatment plan related to chronic kidney disease self management and patient's adherence to plan as established by provider      Reviewed prescribed diet increase daily water intake to 48-64 oz daily unless otherwise directed  Provided education on kidney disease progression    Last practice recorded BP readings:  BP Readings from Last 3 Encounters:  02/17/23 120/72  08/15/22 120/60  02/09/22 134/82   Most recent eGFR/CrCl:  Lab Results  Component Value Date   EGFR 44 (L) 02/14/2023    No components found for:  "CRCL"         If you are experiencing a Mental Health or Behavioral Health Crisis or need someone to talk to, please call 1-800-273-TALK (toll free, 24 hour hotline)  Patient verbalizes understanding of instructions and care plan provided today and agrees to view in MyChart. Active MyChart status and patient understanding of how to access instructions and care plan via MyChart confirmed with patient.     Delsa Sale RN BSN CCM [place]  [facility], [facility] Health Nurse Care Coordinator  Direct Dial: [phone] Website: [email or web address]

## 2023-04-24 NOTE — Patient Outreach (Addendum)
Care Coordination   Follow Up Visit Note   04/24/2023 Name: Jill Garza MRN: [id number] DOB: 12/12/1950  Jill Garza is a 72 y.o. year old female who sees Janyth Contes, Janene Harvey, NP for primary care. I spoke with  Jill Garza by phone today.  What matters to the patients health and wellness today?  Patient would like to continue to stay healthy and manage her chronic health conditions.     Goals Addressed             This Visit's Progress    COMPLETED: To lower Triglycerides       Care Coordination Interventions: Provider established cholesterol goals reviewed Review of patient status, including review of consultant's reports, relevant laboratory and other test results, and medications completed Lipid Panel     Component Value Date/Time   CHOL 115 02/14/2023 0803   CHOL 177 09/02/2015 0846   TRIG 150 (H) 02/14/2023 0803   HDL 42 (L) 02/14/2023 0803   HDL 50 09/02/2015 0846   CHOLHDL 2.7 02/14/2023 0803   VLDL 33 (H) 10/19/2016 0828   LDLCALC 50 02/14/2023 0803   LABVLDL 30 09/02/2015 0846          COMPLETED: To maintain and or improve A1c       Care Coordination Interventions: Review of patient status, including review of consultants reports, relevant laboratory and other test results, and medications completed Counseled on Diabetic diet, my plate method, using the plate method Assessed patient's understanding of A1c goal: <7.0 % Lab Results  Component Value Date   HGBA1C 7.1 (H) 02/14/2023          COMPLETED: To manage CKD       Care Coordination Interventions: Assessed the Patient understanding of chronic kidney disease    Evaluation of current treatment plan related to chronic kidney disease self management and patient's adherence to plan as established by provider      Reviewed prescribed diet increase daily water intake to 48-64 oz daily unless otherwise directed  Provided education on kidney disease progression    Last practice recorded  BP readings:  BP Readings from Last 3 Encounters:  02/17/23 120/72  08/15/22 120/60  02/09/22 134/82   Most recent eGFR/CrCl:  Lab Results  Component Value Date   EGFR 44 (L) 02/14/2023    No components found for: "CRCL"     Interventions Today    Flowsheet Row Most Recent Value  Chronic Disease   Chronic disease during today's visit Other, Diabetes, Chronic Kidney Disease/End Stage Renal Disease (ESRD)  [tortuous aorta,  HDL,  CKD]  General Interventions   General Interventions Discussed/Reviewed General Interventions Discussed, General Interventions Reviewed, Doctor Visits  Doctor Visits Discussed/Reviewed Doctor Visits Discussed, Doctor Visits Reviewed, PCP  Education Interventions   Education Provided Provided Education  Provided Verbal Education On When to see the doctor, Nutrition  Nutrition Interventions   Nutrition Discussed/Reviewed Nutrition Discussed, Nutrition Reviewed, Fluid intake  Pharmacy Interventions   Pharmacy Dicussed/Reviewed Pharmacy Topics Reviewed, Pharmacy Topics Discussed          SDOH assessments and interventions completed:  Yes  SDOH Interventions Today    Flowsheet Row Most Recent Value  SDOH Interventions   Food Insecurity Interventions Intervention Not Indicated  Housing Interventions Intervention Not Indicated  Transportation Interventions Intervention Not Indicated  Utilities Interventions Intervention Not Indicated        Care Coordination Interventions:  Yes, provided   Follow up plan: No further intervention required.   Encounter  Outcome:  Patient Visit Completed

## 2023-05-23 ENCOUNTER — Other Ambulatory Visit: Payer: Self-pay | Admitting: Nurse Practitioner

## 2023-05-23 DIAGNOSIS — E785 Hyperlipidemia, unspecified: Secondary | ICD-10-CM

## 2023-06-02 ENCOUNTER — Other Ambulatory Visit: Payer: Self-pay | Admitting: Nurse Practitioner

## 2023-06-13 ENCOUNTER — Encounter: Payer: Self-pay | Admitting: Nurse Practitioner

## 2023-06-13 ENCOUNTER — Ambulatory Visit: Payer: Medicare Other | Admitting: Nurse Practitioner

## 2023-06-13 DIAGNOSIS — Z0000 Encounter for general adult medical examination without abnormal findings: Secondary | ICD-10-CM

## 2023-06-13 NOTE — Patient Instructions (Signed)
  Jill Garza , Thank you for taking time to come for your Medicare Wellness Visit. I appreciate your ongoing commitment to your health goals. Please review the following plan we discussed and let me know if I can assist you in the future.   This is a list of the screening recommended for you and due dates:  Health Maintenance  Topic Date Due   COVID-19 Vaccine (7 - 2024-25 season) 01/08/2023   Eye exam for diabetics  02/07/2023   Hemoglobin A1C  08/15/2023   Yearly kidney function blood test for diabetes  02/14/2024   Yearly kidney health urinalysis for diabetes  02/17/2024   Complete foot exam   02/17/2024   Medicare Annual Wellness Visit  06/12/2024   Mammogram  02/16/2025   Colon Cancer Screening  06/16/2025   DTaP/Tdap/Td vaccine (3 - Td or Tdap) 10/23/2029   Pneumonia Vaccine  Completed   Flu Shot  Completed   DEXA scan (bone density measurement)  Completed   Hepatitis C Screening  Completed   Zoster (Shingles) Vaccine  Completed   HPV Vaccine  Aged Out

## 2023-06-13 NOTE — Progress Notes (Signed)
  This service is provided via telemedicine  No vital signs collected/recorded due to the encounter was a telemedicine visit.   Location of patient (ex: home, work):  Home  Patient consents to a telephone visit:  Yes  Location of the provider (ex: office, home):  Office [place].   Name of any referring provider:  na  Names of all persons participating in the telemedicine service and their role in the encounter:  Jill Garza, Patient, Nelda Severe, CMA, Abbey Chatters, NP  Time spent on call:  7:16

## 2023-06-13 NOTE — Progress Notes (Signed)
 Subjective:   Jill Garza is a 73 y.o. female who presents for Medicare Annual (Subsequent) preventive examination.  Visit Complete: Virtual I connected with  Jill Garza on 06/13/23 by a video and audio enabled telemedicine application and verified that I am speaking with the correct person using two identifiers.  Patient Location: Home  Provider Location: Office/Clinic  I discussed the limitations of evaluation and management by telemedicine. The patient expressed understanding and agreed to proceed.  Vital Signs: Because this visit was a virtual/telehealth visit, some criteria may be missing or patient reported. Any vitals not documented were not able to be obtained and vitals that have been documented are patient reported.   Cardiac Risk Factors include: advanced age (>44men, >85 women);obesity (BMI >30kg/m2);sedentary lifestyle;hypertension;dyslipidemia;diabetes mellitus     Objective:    There were no vitals filed for this visit. There is no height or weight on file to calculate BMI.     06/13/2023   11:00 AM 06/07/2022   11:00 AM 02/09/2022    9:24 AM 08/06/2021    9:51 AM 05/04/2021    3:21 PM 09/10/2020    6:51 AM 07/24/2020    2:48 PM  Advanced Directives  Does Patient Have a Medical Advance Directive? Yes Yes Yes Yes Yes Yes Yes  Type of Estate Agent of [place];Living will Healthcare Power of [employer] Power of [employer] Power of [place];Living will Healthcare Power of [employer];Living will Healthcare Power of [place];Living will Healthcare Power of [place];Living will;Out of facility DNR (pink MOST or yellow form)  Does patient want to make changes to medical advance directive? No - Patient declined No - Patient declined No - Patient declined No - Patient declined No - Patient declined No - Patient declined No - Patient declined  Copy of Healthcare Power of Attorney in Chart? Yes - validated most recent copy scanned  in chart (See row information) Yes - validated most recent copy scanned in chart (See row information) Yes - validated most recent copy scanned in chart (See row information) Yes - validated most recent copy scanned in chart (See row information) No - copy requested No - copy requested No - copy requested    Current Medications (verified) Outpatient Encounter Medications as of 06/13/2023  Medication Sig   amLODipine  (NORVASC ) 5 MG tablet Take 1 tablet by mouth once daily   clindamycin  (CLEOCIN ) 300 MG capsule 4 tablets prior to dental work   fenofibrate  (TRICOR ) 145 MG tablet TAKE ONE TABLET BY MOUTH ONCE A DAY   glipiZIDE  (GLUCOTROL ) 5 MG tablet TAKE 1 TABLET BY MOUTH TWICE DAILY BEFORE MEAL(S)   losartan  (COZAAR ) 100 MG tablet Take 1 tablet by mouth once daily   omeprazole  (PRILOSEC) 20 MG capsule Take 1 capsule by mouth once daily   PARoxetine  (PAXIL ) 30 MG tablet Take 1 tablet by mouth once daily   rosuvastatin  (CRESTOR ) 5 MG tablet TAKE 1 TABLET BY MOUTH THREE TIMES A WEEK   No facility-administered encounter medications on file as of 06/13/2023.    Allergies (verified) Penicillins, Ivp dye [iodinated contrast media], and Percocet [oxycodone -acetaminophen ]   History: Past Medical History:  Diagnosis Date   Arthritis    Cancer (HCC)    melanoma- right leg   Chronic kidney disease    Depression    GERD (gastroesophageal reflux disease)    pt denies    History of hiatal hernia    Hypercholesterolemia    Hyperparathyroidism (HCC)    Hypertension  Sweat, sweating, excessive    Type 2 diabetes mellitus (HCC)    Past Surgical History:  Procedure Laterality Date   ABDOMINAL HYSTERECTOMY  1979   APPENDECTOMY  1964   BREAST EXCISIONAL BIOPSY Left 08/2014   benign   BREAST LUMPECTOMY WITH RADIOACTIVE SEED LOCALIZATION Left 12/31/2014   Procedure: LEFT BREAST LUMPECTOMY WITH RADIOACTIVE SEED LOCALIZATION;  Surgeon: Donnice Lima, MD;  Location: [facility];  Service:  General;  Laterality: Left;   CARPAL TUNNEL RELEASE Right 1977   CARPAL TUNNEL RELEASE Left 2007   CATARACT EXTRACTION Left 04/13/2014   Dr. Cleatus   CATARACT EXTRACTION Right 06/10/2014   Dr. Cleatus   CHOLECYSTECTOMY N/A 07/14/2017   Procedure: LAPAROSCOPIC CHOLECYSTECTOMY;  Surgeon: Teresa Lonni HERO, MD;  Location: [facility];  Service: General;  Laterality: N/A;   COLONOSCOPY  2009   JOINT REPLACEMENT     MELANOMA EXCISION     PARATHYROIDECTOMY N/A 09/10/2020   Procedure: PARATHYROIDECTOMY;  Surgeon: Eletha Boas, MD;  Location: [facility];  Service: General;  Laterality: N/A;   THYROID  EXPLORATION N/A 09/10/2020   Procedure: POSSIBLE NECK EXPLORATION;  Surgeon: Eletha Boas, MD;  Location: [facility];  Service: General;  Laterality: N/A;   TONSILLECTOMY  1962   TOTAL KNEE ARTHROPLASTY Left 2012   TOTAL KNEE ARTHROPLASTY Right 2008   Family History  Problem Relation Age of Onset   Pulmonary embolism Mother        Multiple, B/L    Heart attack Father        Died of MI age 27   Hypertension Sister    Diabetes type II Sister    High Cholesterol Sister    Deep vein thrombosis Sister    Hypertension Brother    Heart disease Brother 50   Diabetes Brother    High Cholesterol Brother    Hypertension Brother    High Cholesterol Brother    Alcohol abuse Brother    Deep vein thrombosis Brother    Atrial fibrillation Brother    Hepatitis Brother    Cancer Maternal Grandmother 50   Reye's syndrome Daughter    Stroke Other    Colon cancer Neg Hx    Esophageal cancer Neg Hx    Stomach cancer Neg Hx    Rectal cancer Neg Hx    Social History   Socioeconomic History   Marital status: Married    Spouse name: Not on file   Number of children: Not on file   Years of education: Not on file   Highest education level: Master's degree (e.g., MA, MS, MEng, MEd, MSW, MBA)  Occupational History   Not on file  Tobacco Use   Smoking status: Former    Current packs/day: 0.00    Average  packs/day: 1 pack/day for 15.0 years (15.0 ttl pk-yrs)    Types: Cigarettes    Start date: 05/10/1971    Quit date: 05/09/1986    Years since quitting: 37.1   Smokeless tobacco: Never  Vaping Use   Vaping status: Never Used  Substance and Sexual Activity   Alcohol use: Yes    Alcohol/week: 0.0 standard drinks of alcohol    Comment: rare   Drug use: No   Sexual activity: Yes  Other Topics Concern   Not on file  Social History Narrative   Not on file   Social Drivers of Health   Financial Resource Strain: Low Risk  (06/12/2023)   Overall Financial Resource Strain (CARDIA)    Difficulty of  Paying Living Expenses: Not hard at all  Food Insecurity: No Food Insecurity (06/12/2023)   Hunger Vital Sign    Worried About Running Out of Food in the Last Year: Never true    Ran Out of Food in the Last Year: Never true  Transportation Needs: No Transportation Needs (06/12/2023)   PRAPARE - Administrator, Civil Service (Medical): No    Lack of Transportation (Non-Medical): No  Physical Activity: Insufficiently Active (06/12/2023)   Exercise Vital Sign    Days of Exercise per Week: 2 days    Minutes of Exercise per Session: 10 min  Stress: No Stress Concern Present (06/12/2023)   [employer] of Occupational Health - Occupational Stress Questionnaire    Feeling of Stress : Only a little  Social Connections: Socially Isolated (06/12/2023)   Social Connection and Isolation Panel [NHANES]    Frequency of Communication with Friends and Family: Once a week    Frequency of Social Gatherings with Friends and Family: Never    Attends Religious Services: Never    Diplomatic Services Operational Officer: No    Attends Engineer, Structural: Not on file    Marital Status: Married    Tobacco Counseling Counseling given: Not Answered   Clinical Intake:  Pre-visit preparation completed: Yes  Pain : No/denies pain     BMI - recorded: 38 Nutritional Risks: None  How often do  you need to have someone help you when you read instructions, pamphlets, or other written materials from your doctor or pharmacy?: 1 - Never         Activities of Daily Living    06/13/2023   10:55 AM 06/12/2023   12:20 PM  In your present state of health, do you have any difficulty performing the following activities:  Hearing? 0 0  Vision? 0 0  Difficulty concentrating or making decisions? 0 0  Walking or climbing stairs? 0 0  Dressing or bathing? 0 0  Doing errands, shopping? 0 0  Preparing Food and eating ? N N  Using the Toilet? N N  In the past six months, have you accidently leaked urine? Y Y  Do you have problems with loss of bowel control? N N  Managing your Medications? N N  Managing your Finances? N N  Housekeeping or managing your Housekeeping? N N    Patient Care Team: [name], NP as PCP - General (Geriatric Medicine) Lavona Agent, MD as PCP - Cardiology (Cardiology) Regenia Prentice Clack, MD as Consulting Physician (Ophthalmology) Gearline Norris, MD as Consulting Physician (Nephrology) [facility]  Indicate any recent Medical Services you may have received from other than [facility] providers in the past year (date may be approximate).     Assessment:   This is a routine wellness examination for [place].  Hearing/Vision screen No results found.   Goals Addressed   None    Depression Screen    06/13/2023   11:00 AM 02/17/2023    9:47 AM 08/15/2022    9:46 AM 06/07/2022   10:57 AM 08/06/2021   10:02 AM 05/04/2021    3:22 PM 04/22/2020    9:53 AM  PHQ 2/9 Scores  PHQ - 2 Score 0 0 0 0 0 0 0  PHQ- 9 Score  0 0        Fall Risk    06/13/2023   11:00 AM 06/12/2023   12:20 PM 02/17/2023    9:47 AM 06/07/2022   10:58  AM 08/06/2021   10:02 AM  Fall Risk   Falls in the past year? 0 0 0 0 0  Number falls in past yr: 0 0 0 0 0  Injury with Fall? 0 0 0 0 0  Risk for fall due to :   No Fall Risks No Fall Risks No Fall Risks  Follow up    Falls evaluation completed Falls evaluation completed Falls evaluation completed    MEDICARE RISK AT HOME: Medicare Risk at Home Any stairs in or around the home?: (Patient-Rptd) Yes If so, are there any without handrails?: (Patient-Rptd) No Home free of loose throw rugs in walkways, pet beds, electrical cords, etc?: (Patient-Rptd) Yes Adequate lighting in your home to reduce risk of falls?: (Patient-Rptd) Yes Life alert?: (Patient-Rptd) No Use of a cane, walker or w/c?: (Patient-Rptd) No Grab bars in the bathroom?: (Patient-Rptd) No Shower chair or bench in shower?: (Patient-Rptd) No Elevated toilet seat or a handicapped toilet?: (Patient-Rptd) No  TIMED UP AND GO:  Was the test performed?  No    Cognitive Function:    04/22/2019    9:36 AM 04/13/2017    9:30 AM 03/18/2016    9:15 AM 01/29/2015    1:50 PM  MMSE - Mini Mental State Exam  Not completed: Refused     Orientation to time 5 5 5 5   Orientation to Place 5 5 5 5   Registration 3 3 3 3   Attention/ Calculation 5 5 5 5   Recall 3 3 3 1   Language- name 2 objects 2 2 2 2   Language- repeat 1 1 1 1   Language- follow 3 step command 3 3 3 3   Language- read & follow direction 1 1 1 1   Write a sentence 1 1 1 1   Copy design 1 1 1 1   Total score 30 30 30 28         06/13/2023   11:00 AM 06/07/2022   11:00 AM 05/04/2021    3:24 PM 04/22/2020   10:00 AM  6CIT Screen  What Year? 0 points 0 points 0 points 0 points  What month? 0 points 0 points 0 points 0 points  What time? 0 points 0 points 0 points 0 points  Count back from 20 0 points 0 points 0 points 0 points  Months in reverse 0 points 0 points 0 points 0 points  Repeat phrase 0 points 0 points 2 points 0 points  Total Score 0 points 0 points 2 points 0 points    Immunizations Immunization History  Administered Date(s) Administered   Fluad Quad(high Dose 65+) 02/05/2019, 03/06/2020, 01/29/2021, 02/11/2022   Fluad Trivalent(High Dose 65+) 02/17/2023   Influenza  Whole 01/26/2013   Influenza, High Dose Seasonal PF 02/21/2018   Influenza-Unspecified 01/28/2014, 02/18/2016, 03/01/2017   PFIZER(Purple Top)SARS-COV-2 Vaccination 05/31/2019, 06/21/2019, 02/04/2020, 08/07/2020   Pfizer Covid-19 Vaccine Bivalent Booster 53yrs & up 02/06/2021, 01/27/2022   Pneumococcal Conjugate-13 12/17/2013   Pneumococcal Polysaccharide-23 04/09/2013, 04/18/2018   Tdap 08/17/2009, 10/24/2019   Unspecified SARS-COV-2 Vaccination 01/28/2022   Zoster Recombinant(Shingrix) 04/18/2018, 06/25/2018   Zoster, Live 01/09/2011    TDAP status: Up to date  Flu Vaccine status: Up to date  Pneumococcal vaccine status: Up to date  Covid-19 vaccine status: Information provided on how to obtain vaccines.   Qualifies for Shingles Vaccine? Yes   Zostavax completed No   Shingrix Completed?: Yes  Screening Tests Health Maintenance  Topic Date Due   COVID-19 Vaccine (7 - 2024-25 season) 01/08/2023  OPHTHALMOLOGY EXAM  02/07/2023   HEMOGLOBIN A1C  08/15/2023   Diabetic kidney evaluation - eGFR measurement  02/14/2024   Diabetic kidney evaluation - Urine ACR  02/17/2024   FOOT EXAM  02/17/2024   Medicare Annual Wellness (AWV)  06/12/2024   MAMMOGRAM  02/16/2025   Colonoscopy  06/16/2025   DTaP/Tdap/Td (3 - Td or Tdap) 10/23/2029   Pneumonia Vaccine 53+ Years old  Completed   INFLUENZA VACCINE  Completed   DEXA SCAN  Completed   Hepatitis C Screening  Completed   Zoster Vaccines- Shingrix  Completed   HPV VACCINES  Aged Out    Health Maintenance  Health Maintenance Due  Topic Date Due   COVID-19 Vaccine (7 - 2024-25 season) 01/08/2023   OPHTHALMOLOGY EXAM  02/07/2023    Colorectal cancer screening: Type of screening: Colonoscopy. Completed 06/16/2020. Repeat every 5 years  Mammogram status: Completed 02/17/2024. Repeat every year  Bone Density status: Completed 2021. Results reflect: Bone density results: OSTEOPENIA. Repeat every 2 years.  Lung Cancer Screening:  (Low Dose CT Chest recommended if Age 55-80 years, 20 pack-year currently smoking OR have quit w/in 15years.) does not qualify.   Lung Cancer Screening Referral: na  Additional Screening:  Hepatitis C Screening: does qualify; Completed 20216  Vision Screening: Recommended annual ophthalmology exams for early detection of glaucoma and other disorders of the eye. Is the patient up to date with their annual eye exam?  Yes  Who is the provider or what is the name of the office in which the patient attends annual eye exams? [facility] If pt is not established with a provider, would they like to be referred to a provider to establish care? No .   Dental Screening: Recommended annual dental exams for proper oral hygiene  Diabetic Foot Exam: Diabetic Foot Exam: Completed 02/17/2023  Community Resource Referral / Chronic Care Management: CRR required this visit?  No   CCM required this visit?  No     Plan:     I have personally reviewed and noted the following in the patient's chart:   Medical and social history Use of alcohol, tobacco or illicit drugs  Current medications and supplements including opioid prescriptions. Patient is not currently taking opioid prescriptions. Functional ability and status Nutritional status Physical activity Advanced directives List of other physicians Hospitalizations, surgeries, and ER visits in previous 12 months Vitals Screenings to include cognitive, depression, and falls Referrals and appointments  In addition, I have reviewed and discussed with patient certain preventive protocols, quality metrics, and best practice recommendations. A written personalized care plan for preventive services as well as general preventive health recommendations were provided to patient.     Harlene MARLA An, NP   06/13/2023   After Visit Summary: (MyChart) Due to this being a telephonic visit, the after visit summary with patients personalized plan was offered to  patient via MyChart

## 2023-06-20 ENCOUNTER — Other Ambulatory Visit: Payer: Self-pay | Admitting: Nurse Practitioner

## 2023-06-20 DIAGNOSIS — I10 Essential (primary) hypertension: Secondary | ICD-10-CM

## 2023-07-18 ENCOUNTER — Other Ambulatory Visit: Payer: Self-pay | Admitting: Nurse Practitioner

## 2023-07-18 DIAGNOSIS — I10 Essential (primary) hypertension: Secondary | ICD-10-CM

## 2023-08-15 ENCOUNTER — Other Ambulatory Visit: Payer: Medicare Other

## 2023-08-15 DIAGNOSIS — I10 Essential (primary) hypertension: Secondary | ICD-10-CM | POA: Diagnosis not present

## 2023-08-15 DIAGNOSIS — E785 Hyperlipidemia, unspecified: Secondary | ICD-10-CM | POA: Diagnosis not present

## 2023-08-15 DIAGNOSIS — N1832 Chronic kidney disease, stage 3b: Secondary | ICD-10-CM | POA: Diagnosis not present

## 2023-08-15 DIAGNOSIS — E1140 Type 2 diabetes mellitus with diabetic neuropathy, unspecified: Secondary | ICD-10-CM | POA: Diagnosis not present

## 2023-08-15 DIAGNOSIS — R002 Palpitations: Secondary | ICD-10-CM | POA: Diagnosis not present

## 2023-08-18 ENCOUNTER — Encounter: Payer: Self-pay | Admitting: Nurse Practitioner

## 2023-08-18 ENCOUNTER — Ambulatory Visit: Payer: Medicare Other | Admitting: Nurse Practitioner

## 2023-08-18 ENCOUNTER — Ambulatory Visit: Attending: Nurse Practitioner

## 2023-08-18 VITALS — BP 112/66 | HR 83 | Temp 97.9°F | Ht 64.0 in | Wt 225.0 lb

## 2023-08-18 DIAGNOSIS — R002 Palpitations: Secondary | ICD-10-CM

## 2023-08-18 DIAGNOSIS — I10 Essential (primary) hypertension: Secondary | ICD-10-CM | POA: Diagnosis not present

## 2023-08-18 DIAGNOSIS — R1310 Dysphagia, unspecified: Secondary | ICD-10-CM | POA: Diagnosis not present

## 2023-08-18 DIAGNOSIS — F339 Major depressive disorder, recurrent, unspecified: Secondary | ICD-10-CM | POA: Diagnosis not present

## 2023-08-18 DIAGNOSIS — R053 Chronic cough: Secondary | ICD-10-CM

## 2023-08-18 DIAGNOSIS — E1140 Type 2 diabetes mellitus with diabetic neuropathy, unspecified: Secondary | ICD-10-CM

## 2023-08-18 DIAGNOSIS — N1832 Chronic kidney disease, stage 3b: Secondary | ICD-10-CM | POA: Diagnosis not present

## 2023-08-18 DIAGNOSIS — E785 Hyperlipidemia, unspecified: Secondary | ICD-10-CM

## 2023-08-18 NOTE — Progress Notes (Signed)
 Careteam: Patient Care Team: Sharon Seller, NP as PCP - General (Geriatric Medicine) Rollene Rotunda, MD as PCP - Cardiology (Cardiology) Genia Del Daisy Blossom, MD as Consulting Physician (Ophthalmology) Bufford Buttner, MD as Consulting Physician (Nephrology) [facility]  PLACE OF SERVICE:  [facility]  Advanced Directive information    Allergies  Allergen Reactions   Penicillins     Rash    Ivp Dye [Iodinated Contrast Media]     Hives    Percocet [Oxycodone-Acetaminophen]     Itching     Chief Complaint  Patient presents with   Medical Management of Chronic Issues    6 month follow-up. We requested eye exam in November and per patient she had eye exam in October 2024. Flutter in chest from time to time, seen cardiologist for it (was given a beta-blocker to take as needed). Patient is taking Glipizide different from medication list, once daily versus twice daily.    Discussed the use of AI scribe software for clinical note transcription with the patient, who gave verbal consent to proceed.  History of Present Illness   Jill Garza is a 73 year old female with diabetes and kidney issues who presents with concerns about medication management and palpitations.  She is managing her diabetes with glipizide, but she takes both doses simultaneously instead of before breakfast and supper. Her A1c is 7.3%. She has not tried other diabetes medications like Trulicity or Ozempic due to cost concerns, as her insurance does not cover medications. She also takes fenofibrate for cholesterol, which is well controlled. She was taken off metformin due to kidney function issues.  She has a history of kidney issues and has not seen her nephrologist since December 2023 due to scheduling issues, but she has an upcoming appointment. She is advised to stay hydrated and avoid NSAIDs, using Tylenol for pain instead.  She experiences palpitations described as 'fluttering' in  her chest, occurring about five times a month. She recalls an episode in [place] where her heart rate fluctuated significantly. She has a family history of heart issues, including her father who died of a sudden heart attack and siblings with cardiac conditions. She was previously prescribed propranolol but did not take it as the episodes were brief.  She reports shortness of breath, especially noticeable during a recent trip to [place], and a chronic cough. She also experiences choking while eating, which occurs almost every meal. She has a history of smoking, which may contribute to her respiratory symptoms.  She is in the process of moving to a smaller home due to her husband's stroke, which has limited his mobility.       Review of Systems:  Review of Systems  Constitutional:  Negative for chills, fever and weight loss.  HENT:  Negative for tinnitus.   Respiratory:  Positive for cough and shortness of breath. Negative for sputum production.   Cardiovascular:  Positive for palpitations. Negative for chest pain and leg swelling.  Gastrointestinal:  Negative for abdominal pain, constipation, diarrhea and heartburn.  Genitourinary:  Negative for dysuria, frequency and urgency.  Musculoskeletal:  Negative for back pain, falls, joint pain and myalgias.  Skin: Negative.   Neurological:  Negative for dizziness and headaches.  Psychiatric/Behavioral:  Negative for depression and memory loss. The patient does not have insomnia.     Past Medical History:  Diagnosis Date   Arthritis    Cancer (HCC)    melanoma- right leg   Chronic kidney disease  Depression    GERD (gastroesophageal reflux disease)    pt denies    History of hiatal hernia    Hypercholesterolemia    Hyperparathyroidism (HCC)    Hypertension    Sweat, sweating, excessive    Type 2 diabetes mellitus (HCC)    Past Surgical History:  Procedure Laterality Date   ABDOMINAL HYSTERECTOMY  1979   APPENDECTOMY  1964   BREAST  EXCISIONAL BIOPSY Left 08/2014   benign   BREAST LUMPECTOMY WITH RADIOACTIVE SEED LOCALIZATION Left 12/31/2014   Procedure: LEFT BREAST LUMPECTOMY WITH RADIOACTIVE SEED LOCALIZATION;  Surgeon: Manus Rudd, MD;  Location: [facility];  Service: General;  Laterality: Left;   CARPAL TUNNEL RELEASE Right 1977   CARPAL TUNNEL RELEASE Left 2007   CATARACT EXTRACTION Left 04/13/2014   Dr. Elmer Picker   CATARACT EXTRACTION Right 06/10/2014   Dr. Elmer Picker   CHOLECYSTECTOMY N/A 07/14/2017   Procedure: LAPAROSCOPIC CHOLECYSTECTOMY;  Surgeon: Andria Meuse, MD;  Location: [facility];  Service: General;  Laterality: N/A;   COLONOSCOPY  2009   JOINT REPLACEMENT     MELANOMA EXCISION     PARATHYROIDECTOMY N/A 09/10/2020   Procedure: PARATHYROIDECTOMY;  Surgeon: Darnell Level, MD;  Location: [facility];  Service: General;  Laterality: N/A;   THYROID EXPLORATION N/A 09/10/2020   Procedure: POSSIBLE NECK EXPLORATION;  Surgeon: Darnell Level, MD;  Location: [facility];  Service: General;  Laterality: N/A;   TONSILLECTOMY  1962   TOTAL KNEE ARTHROPLASTY Left 2012   TOTAL KNEE ARTHROPLASTY Right 2008   Social History:   reports that she quit smoking about 37 years ago. Her smoking use included cigarettes. She started smoking about 52 years ago. She has a 15 pack-year smoking history. She has never used smokeless tobacco. She reports current alcohol use. She reports that she does not use drugs.  Family History  Problem Relation Age of Onset   Pulmonary embolism Mother        Multiple, B/L    Heart attack Father        Died of MI age 6   Hypertension Sister    Diabetes type II Sister    High Cholesterol Sister    Deep vein thrombosis Sister    Hypertension Brother    Heart disease Brother 65   Diabetes Brother    High Cholesterol Brother    Hypertension Brother    High Cholesterol Brother    Alcohol abuse Brother    Deep vein thrombosis Brother    Atrial fibrillation Brother    Hepatitis  Brother    Cancer Maternal Grandmother 15   Reye's syndrome Daughter    Stroke Other    Colon cancer Neg Hx    Esophageal cancer Neg Hx    Stomach cancer Neg Hx    Rectal cancer Neg Hx     Medications: Patient's Medications  New Prescriptions   No medications on file  Previous Medications   ACETAMINOPHEN (TYLENOL) 500 MG TABLET    Take 500 mg by mouth as needed (For headache).   AMLODIPINE (NORVASC) 5 MG TABLET    Take 1 tablet by mouth once daily   CLINDAMYCIN (CLEOCIN) 300 MG CAPSULE    4 tablets prior to dental work   FENOFIBRATE (TRICOR) 145 MG TABLET    TAKE ONE TABLET BY MOUTH ONCE A DAY   GLIPIZIDE (GLUCOTROL) 5 MG TABLET    TAKE 1 TABLET BY MOUTH TWICE DAILY BEFORE MEAL(S)   LOSARTAN (COZAAR) 100 MG TABLET  Take 1 tablet by mouth once daily   OMEPRAZOLE (PRILOSEC) 20 MG CAPSULE    Take 1 capsule by mouth once daily   PAROXETINE (PAXIL) 30 MG TABLET    Take 1 tablet by mouth once daily   ROSUVASTATIN (CRESTOR) 5 MG TABLET    TAKE 1 TABLET BY MOUTH THREE TIMES A WEEK  Modified Medications   No medications on file  Discontinued Medications   No medications on file    Physical Exam:  Vitals:   08/18/23 0942  BP: 112/66  Pulse: 83  Temp: 97.9 F (36.6 C)  TempSrc: Temporal  SpO2: 98%  Weight: 225 lb (102.1 kg)  Height: 5\' 4"  (1.626 m)   Body mass index is 38.62 kg/m. Wt Readings from Last 3 Encounters:  08/18/23 225 lb (102.1 kg)  02/17/23 223 lb 9.6 oz (101.4 kg)  08/15/22 223 lb 6.4 oz (101.3 kg)    Physical Exam Constitutional:      General: She is not in acute distress.    Appearance: She is well-developed. She is not diaphoretic.  HENT:     Head: Normocephalic and atraumatic.     Mouth/Throat:     Pharynx: No oropharyngeal exudate.  Eyes:     Conjunctiva/sclera: Conjunctivae normal.     Pupils: Pupils are equal, round, and reactive to light.  Cardiovascular:     Rate and Rhythm: Normal rate and regular rhythm.     Heart sounds: Normal heart  sounds.  Pulmonary:     Effort: Pulmonary effort is normal.     Breath sounds: Normal breath sounds.  Abdominal:     General: Bowel sounds are normal.     Palpations: Abdomen is soft.  Musculoskeletal:     Cervical back: Normal range of motion and neck supple.     Right lower leg: No edema.     Left lower leg: No edema.  Skin:    General: Skin is warm and dry.  Neurological:     Mental Status: She is alert.  Psychiatric:        Mood and Affect: Mood normal.    Labs reviewed: Basic Metabolic Panel: Recent Labs    02/14/23 0803 08/15/23 0811  NA 138 138  K 4.4 4.7  CL 104 101  CO2 26 29  GLUCOSE 119* 146*  BUN 24 25  CREATININE 1.29* 1.61*  CALCIUM 9.7 10.1   Liver Function Tests: Recent Labs    02/14/23 0803 08/15/23 0811  AST 16 17  ALT 16 16  BILITOT 0.5 0.4  PROT 6.9 7.1   No results for input(s): "LIPASE", "AMYLASE" in the last 8760 hours. No results for input(s): "AMMONIA" in the last 8760 hours. CBC: Recent Labs    02/14/23 0803 08/15/23 0811  WBC 5.8 5.7  NEUTROABS 2,906 3,021  HGB 13.8 13.9  HCT 43.8 42.0  MCV 90.3 85.5  PLT 285 287   Lipid Panel: Recent Labs    02/14/23 0803 08/15/23 0811  CHOL 115 110  HDL 42* 42*  LDLCALC 50 49  TRIG 150* 108  CHOLHDL 2.7 2.6   TSH: No results for input(s): "TSH" in the last 8760 hours. A1C: Lab Results  Component Value Date   HGBA1C 7.3 (H) 08/15/2023     Assessment/Plan .1. Type 2 diabetes mellitus with diabetic neuropathy, without long-term current use of insulin (HCC) (Primary) -she does not have insurance for medication and pays out of pocket for these. Not on metformin due to CKD. Will have her  take glipizide BID with meals and work on dietary modifications with follow up on A1c in 3 months to re-evaluate.  -Encouraged dietary compliance, routine foot care/monitoring and to keep up with diabetic eye exams through ophthalmology  - Hemoglobin A1c; Future  2. Essential hypertension,  benign -Blood pressure well controlled, goal bp <140/90 Continue current medications and dietary modifications follow metabolic panel - EKG 12-Lead  3. Stage 3b chronic kidney disease (HCC) -Chronic and stable Encourage proper hydration Follow metabolic panel Avoid nephrotoxic meds (NSAIDS) Followed by nephrology and has upcoming appt  4. Morbid obesity (HCC) --education provided on healthy weight loss through increase in physical activity and proper nutrition   5. Hyperlipidemia LDL goal <70 -at goal on fenofibrate   6. Recurrent major depressive disorder, remission status unspecified (HCC) - controlled on paxil  7. Palpitation -ongoing and worsening  - Ambulatory referral to Cardiology for further evaluation  - LONG TERM MONITOR (3-14 DAYS); Future - EKG 12-Lead- new right BBB- cardiology referral for further evaluation and treatment   8. Chronic cough -chest xray completed at last follow up without abnormal lung findings however now with worsening shortness of breath - Ambulatory referral to Pulmonology for further evaluation and treatment   9. Dysphagia, unspecified type -getting choked when she eats frequently, also has cough with eating.  - SLP modified barium swallow; Future to evaluate  Return in about 3 months (around 11/17/2023) for routine follow up, labs prior to visit.:  Solene Hereford K. Biagio Borg [facility] [phone]

## 2023-08-18 NOTE — Progress Notes (Unsigned)
 EP to read.

## 2023-08-19 LAB — COMPLETE METABOLIC PANEL WITHOUT GFR
AG Ratio: 1.7 (calc) (ref 1.0–2.5)
ALT: 16 U/L (ref 6–29)
AST: 17 U/L (ref 10–35)
Albumin: 4.5 g/dL (ref 3.6–5.1)
Alkaline phosphatase (APISO): 36 U/L — ABNORMAL LOW (ref 37–153)
BUN/Creatinine Ratio: 16 (calc) (ref 6–22)
BUN: 25 mg/dL (ref 7–25)
CO2: 29 mmol/L (ref 20–32)
Calcium: 10.1 mg/dL (ref 8.6–10.4)
Chloride: 101 mmol/L (ref 98–110)
Creat: 1.61 mg/dL — ABNORMAL HIGH (ref 0.60–1.00)
Globulin: 2.6 g/dL (ref 1.9–3.7)
Glucose, Bld: 146 mg/dL — ABNORMAL HIGH (ref 65–99)
Potassium: 4.7 mmol/L (ref 3.5–5.3)
Sodium: 138 mmol/L (ref 135–146)
Total Bilirubin: 0.4 mg/dL (ref 0.2–1.2)
Total Protein: 7.1 g/dL (ref 6.1–8.1)

## 2023-08-19 LAB — CBC WITH DIFFERENTIAL/PLATELET
Absolute Lymphocytes: 2177 {cells}/uL (ref 850–3900)
Absolute Monocytes: 348 {cells}/uL (ref 200–950)
Basophils Absolute: 51 {cells}/uL (ref 0–200)
Basophils Relative: 0.9 %
Eosinophils Absolute: 103 {cells}/uL (ref 15–500)
Eosinophils Relative: 1.8 %
HCT: 42 % (ref 35.0–45.0)
Hemoglobin: 13.9 g/dL (ref 11.7–15.5)
MCH: 28.3 pg (ref 27.0–33.0)
MCHC: 33.1 g/dL (ref 32.0–36.0)
MCV: 85.5 fL (ref 80.0–100.0)
MPV: 9.9 fL (ref 7.5–12.5)
Monocytes Relative: 6.1 %
Neutro Abs: 3021 {cells}/uL (ref 1500–7800)
Neutrophils Relative %: 53 %
Platelets: 287 10*3/uL (ref 140–400)
RBC: 4.91 10*6/uL (ref 3.80–5.10)
RDW: 12.2 % (ref 11.0–15.0)
Total Lymphocyte: 38.2 %
WBC: 5.7 10*3/uL (ref 3.8–10.8)

## 2023-08-19 LAB — LIPID PANEL
Cholesterol: 110 mg/dL (ref <200)
HDL: 42 mg/dL — ABNORMAL LOW (ref >=50)
LDL Cholesterol (Calc): 49 mg/dL
Non-HDL Cholesterol (Calc): 68 mg/dL (ref <130)
Total CHOL/HDL Ratio: 2.6 (calc) (ref <5.0)
Triglycerides: 108 mg/dL (ref <150)

## 2023-08-19 LAB — TEST AUTHORIZATION

## 2023-08-19 LAB — HEMOGLOBIN A1C
Hgb A1c MFr Bld: 7.3 %{Hb} — ABNORMAL HIGH (ref <5.7)
Mean Plasma Glucose: 163 mg/dL
eAG (mmol/L): 9 mmol/L

## 2023-08-19 LAB — TSH: TSH: 1.92 m[IU]/L (ref 0.40–4.50)

## 2023-08-21 ENCOUNTER — Encounter: Payer: Self-pay | Admitting: Nurse Practitioner

## 2023-08-22 ENCOUNTER — Other Ambulatory Visit (HOSPITAL_COMMUNITY): Payer: Self-pay | Admitting: *Deleted

## 2023-08-22 DIAGNOSIS — R1310 Dysphagia, unspecified: Secondary | ICD-10-CM

## 2023-08-22 DIAGNOSIS — R059 Cough, unspecified: Secondary | ICD-10-CM

## 2023-08-30 ENCOUNTER — Other Ambulatory Visit: Payer: Self-pay | Admitting: Nurse Practitioner

## 2023-08-30 DIAGNOSIS — E785 Hyperlipidemia, unspecified: Secondary | ICD-10-CM

## 2023-09-05 ENCOUNTER — Ambulatory Visit (HOSPITAL_COMMUNITY)
Admission: RE | Admit: 2023-09-05 | Discharge: 2023-09-05 | Disposition: A | Discharge status: Home / Self Care | Source: Ambulatory Visit | Attending: Nurse Practitioner | Admitting: Nurse Practitioner

## 2023-09-05 DIAGNOSIS — I129 Hypertensive chronic kidney disease with stage 1 through stage 4 chronic kidney disease, or unspecified chronic kidney disease: Secondary | ICD-10-CM | POA: Diagnosis not present

## 2023-09-05 DIAGNOSIS — R1310 Dysphagia, unspecified: Secondary | ICD-10-CM | POA: Diagnosis present

## 2023-09-05 DIAGNOSIS — E213 Hyperparathyroidism, unspecified: Secondary | ICD-10-CM | POA: Diagnosis not present

## 2023-09-05 DIAGNOSIS — R0989 Other specified symptoms and signs involving the circulatory and respiratory systems: Secondary | ICD-10-CM | POA: Insufficient documentation

## 2023-09-05 DIAGNOSIS — E785 Hyperlipidemia, unspecified: Secondary | ICD-10-CM | POA: Diagnosis not present

## 2023-09-05 DIAGNOSIS — R059 Cough, unspecified: Secondary | ICD-10-CM

## 2023-09-05 DIAGNOSIS — E1122 Type 2 diabetes mellitus with diabetic chronic kidney disease: Secondary | ICD-10-CM | POA: Diagnosis not present

## 2023-09-05 DIAGNOSIS — N1832 Chronic kidney disease, stage 3b: Secondary | ICD-10-CM | POA: Diagnosis not present

## 2023-09-05 LAB — BASIC METABOLIC PANEL WITH GFR
BUN: 24 — AB (ref 4–21)
CO2: 26 — AB (ref 13–22)
Chloride: 105 (ref 99–108)
Creatinine: 1.3 — AB (ref 0.5–1.1)
Glucose: 108
Potassium: 3.9 meq/L (ref 3.5–5.1)
Sodium: 142 (ref 137–147)

## 2023-09-05 LAB — LAB REPORT - SCANNED
Albumin, Urine POC: 26.1
Albumin/Creatinine Ratio, Urine, POC: 13

## 2023-09-05 LAB — COMPREHENSIVE METABOLIC PANEL WITH GFR
Albumin: 4.3 (ref 3.5–5.0)
Calcium: 9.6 (ref 8.7–10.7)

## 2023-09-05 NOTE — Progress Notes (Signed)
 Modified Barium Swallow Study  Patient Details  Name: Jill Garza MRN: [id number] Date of Birth: Oct 04, 1950  Today's Date: 09/05/2023  Modified Barium Swallow completed.  Full report located under Chart Review in the Imaging Section.  History of Present Illness Pt is a 73 year old female referred by her PCP for OP MBS due to complaint of frequent choking with meals. States she very often chokes on solid foods, particularly dinner. Denies globus but does report a chronic baseline cough. She is unsure if she requently clears her throat. Cannot identify a texture that is more troublesome.   Clinical Impression Pt demonstrates relatively normal swallowing function. Pt initiates hyoid excursion as liquids reach the posterior surface of the epiglottis thus resulting in penetration of thin liquids to the vestibule with immediate ejection (flash/PAS 2). Pt does not cough. There is trace residue in the valleculae with thin liquids, but not with solids. Esophageal sweep with solids, thin and a pill with thins all appeared WNL.  Pt is recommended to continue regular diet and thin liquids. Advised her to consider f/u with ENT for direct visualization of the laryngeal tissue and possibly with an OP SLP that can observe pt eat a meal and identify any behaviors that could result in pts sensation of choking. Factors that may increase risk of adverse event in presence of aspiration Jill Garza & Jill Garza 2021):    Swallow Evaluation Recommendations Recommendations: PO diet PO Diet Recommendation: Regular;Thin liquids (Level 0) Liquid Administration via: Cup;Straw Medication Administration: Whole meds with liquid Supervision: Patient able to self-feed Swallowing strategies  : Slow rate;Small bites/sips Postural changes: Position pt fully upright for meals Oral care recommendations: Pt independent with oral care Recommended consults: Consider ENT consultation    Jill Bateman, MA CCC-SLP  Acute  Rehabilitation Services Secure Chat Preferred Office [phone]    Jill Garza 09/05/2023,12:26 PM

## 2023-09-16 ENCOUNTER — Other Ambulatory Visit: Payer: Self-pay | Admitting: Nurse Practitioner

## 2023-09-16 DIAGNOSIS — I10 Essential (primary) hypertension: Secondary | ICD-10-CM

## 2023-09-26 DIAGNOSIS — R002 Palpitations: Secondary | ICD-10-CM | POA: Diagnosis not present

## 2023-10-02 ENCOUNTER — Ambulatory Visit: Payer: Self-pay | Admitting: Nurse Practitioner

## 2023-10-02 DIAGNOSIS — R002 Palpitations: Secondary | ICD-10-CM | POA: Diagnosis not present

## 2023-10-15 NOTE — Progress Notes (Signed)
 Jill Garza [place], female    DOB: [birth year]   MRN: [id number]   Brief patient profile:  [id number] RN/ER  quit smoking 1988 s resp sequelae  referred to pulmonary clinic 10/18/2023 by [facility] NP  for cough /doe    since 1st of Jan 2025    History of Present Illness  10/18/2023  Pulmonary/ 1st office eval/Chantella Creech  Chief Complaint  Patient presents with   Consult    Dyspnea , notice this has been going on the last 6 months , when moving around , feels like she has to take a deep breath.  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    Also denies any obvious fluctuation of symptoms with weather or environmental changes or other aggravating or alleviating factors except as outlined above   No unusual exposure hx or h/o childhood pna/ asthma or knowledge of premature birth.  Current Allergies, Complete Past Medical History, Past Surgical History, Family History, and Social History were reviewed in [employer] record.  ROS  The following are not active complaints unless bolded Hoarseness, sore throat, dysphagia/globus sensation  dental problems, itching, sneezing,  nasal congestion or discharge of excess mucus or purulent secretions, ear ache,   fever, chills, sweats, unintended wt loss or wt gain, classically pleuritic or exertional cp,  orthopnea pnd or arm/hand swelling  or leg swelling, presyncope, palpitations, abdominal pain, anorexia, nausea, vomiting,  diarrhea  or change in bowel habits or change in bladder habits, change in stools or change in urine, dysuria, hematuria,  rash, arthralgias, visual complaints, headache, numbness, weakness or ataxia or problems with walking or coordination,  change in mood or  memory.             Outpatient Medications Prior to Visit  Medication Sig Dispense Refill   acetaminophen  (TYLENOL ) 500 MG tablet Take 500 mg by mouth as needed (For headache).     amLODipine  (NORVASC ) 5 MG tablet Take 1 tablet by mouth once daily 90 tablet 0   clindamycin  (CLEOCIN ) 300 MG capsule 4 tablets prior to dental work     fenofibrate  (TRICOR ) 145 MG tablet TAKE ONE TABLET BY MOUTH ONCE A DAY 90 tablet 1   glipiZIDE  (GLUCOTROL ) 5 MG tablet TAKE 1 TABLET BY MOUTH TWICE DAILY BEFORE MEAL(S) 180 tablet 1   losartan  (COZAAR ) 100 MG tablet Take 1 tablet by mouth once daily 90 tablet 1   omeprazole  (PRILOSEC) 20 MG capsule Take 1 capsule by mouth once daily 90 capsule 3   rosuvastatin  (CRESTOR ) 5 MG tablet TAKE 1 TABLET BY MOUTH THREE TIMES A WEEK 90 tablet 0   PARoxetine  (PAXIL ) 30 MG tablet Take 1 tablet by mouth once daily (Patient not taking: Reported on 10/18/2023) 90 tablet 1   No facility-administered medications prior to visit.    Past Medical History:  Diagnosis Date   Arthritis  Cancer (HCC)    melanoma- right leg   Chronic kidney disease    Depression    GERD (gastroesophageal reflux disease)    pt denies    History of hiatal hernia    Hypercholesterolemia    Hyperparathyroidism (HCC)    Hypertension    Sweat, sweating, excessive    Type 2 diabetes mellitus (HCC)       Objective:     BP 112/78   Pulse 88   Ht 5' 4 (1.626 m)   Wt 218 lb (98.9 kg)   SpO2 95% Comment: room air  BMI 37.42 kg/m   SpO2: 95 % (room air) pleasant wmb wf mod obese (by BMI ) with classic voice fatigue    HEENT : Oropharynx  clear      Nasal turbinates nl    NECK :  without  apparent JVD/ palpable Nodes/TM     LUNGS: no acc muscle use,  Nl contour chest which is clear to A and P bilaterally without cough on insp or exp maneuvers   CV:  RRR  no s3 or murmur - ? Slt  increase in P2, and no edema   ABD: obese  soft and nontender   MS:  Gait nl   ext warm without deformities Or obvious joint restrictions  calf tenderness, cyanosis or clubbing    SKIN: warm and dry without lesions    NEURO:  alert, approp, nl sensorium with  no motor or cerebellar deficits apparent.    CXR PA and Lateral:   10/18/2023 :    I personally reviewed images and impression is as follows:     Mod/severe kyphosis/ no acute findings / confirmed by radiology over read  Labs ordered/ reviewed:      Chemistry      Component Value Date/Time   NA 142 09/05/2023 0000   K 3.9 09/05/2023 0000   CL 105 09/05/2023 0000   CO2 26 (A) 09/05/2023 0000   BUN 24 (A) 09/05/2023 0000   CREATININE 1.3 (A) 09/05/2023 0000   CREATININE 1.61 (H) 08/15/2023 0811   GLU 108 09/05/2023 0000      Component Value Date/Time   CALCIUM  9.6 09/05/2023 0000   ALKPHOS 90 09/24/2020 1055   AST 17 08/15/2023 0811   ALT 16 08/15/2023 0811   BILITOT 0.4 08/15/2023 0811   BILITOT 0.2 09/02/2015 0846        Lab Results  Component Value Date   WBC 5.7 08/15/2023   HGB 13.9 08/15/2023   HCT 42.0 08/15/2023   MCV 85.5 08/15/2023   PLT 287 08/15/2023     Lab Results  Component Value Date   DDIMER 0.41 10/18/2023      Lab Results  Component Value Date   TSH 1.92 08/15/2023      BNP  10/18/2023    =  97  IgE   10/18/2023    = 98  with Eos 0.1 on 08/15/23     Lab Results  Component Value Date   ESRSEDRATE 6 10/23/2019          Assessment   DOE (dyspnea on exertion) Quit smoking 1988 s resp sequelae - Onset was p Jan 2025 in setting of cough x summer of 2024 and known HH  - 10/18/2023   Walked on RA  x  3  lap(s) =  approx 750  ft  @ rapid pace, stopped due to fatigue> sob  with lowest 02 sats 97%  - 10/18/2023 max  gerd  rx/ doe lab profile   Symptoms are markedly disproportionate to objective findings and not clear to what extent this is actually a pulmonary  problem but pt does appear to have difficult to sort out respiratory symptoms of unknown origin for which  DDX  = almost all start with A and  include Adherence, Ace Inhibitors, Acid Reflux, Active Sinus Disease, Alpha 1 Antitripsin deficiency, Anxiety masquerading as Airways dz,  ABPA,  Allergy(esp in young), Aspiration (esp in elderly), Adverse effects of meds,  Active smoking or Vaping, A bunch of PE's/clot burden (a few small clots can't cause this syndrome unless there is already severe underlying pulm or vascular dz with poor reserve),  Anemia or thyroid  disorder, plus two Bs  = Bronchiectasis and Beta blocker use..and one C= CHF    Most likely dx' are bolded   ? Acid (or non-acid) GERD > always difficult to exclude as up to 75% of pts in some series report no assoc GI/ Heartburn symptoms> rec max (24h)  acid suppression and diet restrictions/ reviewed and instructions given in writing.   ? Anxiety/despression/ deconditioning  > usually at the bottom of this list of usual suspects but  may interfere with adherence and also interpretation of response or lack thereof to symptom management which can be quite subjective.   ? Allergy/ asthma > Eos 0.1/ IGE 97 > clinically  seems unlikely / probably needs f/u pfts though if not improving   ? Active sinus rhinitis with pnds >  see above labs/ try add hs  1st gen H1 blockers per guidelines     ? A bunch of PEs>   D dimer nl - while a normal  or high normal value (seen commonly in the elderly or chronically ill)  may miss small peripheral pe, the clot burden with sob is moderately high and the d dimer  has a very high neg pred value if used in this setting.    ? Chf >  BNP less than 100/ cxr not suggestive   Rec regular sub max ex up to 30 min daily as tol and f/u in 6-8 weeks, sooner prn    Upper airway  cough syndrome Onset same time as doe = fall 2024 with choking sensation worse with meals/ persistent globus - neg MBS 09/05/23  - 10/18/2023 trial of max gerd rx and 1st gen H1 blockers per guidelines    If not improved consider ENT eval/ Speech therapy with Daphney Eans.  Discussed in detail all the  indications, usual  risks and alternatives  relative to the benefits with patient who agrees to proceed with Rx as outlined.      Each maintenance medication was reviewed in detail including emphasizing most importantly the difference between maintenance and prns and under what circumstances the prns are to be triggered using an action plan format where appropriate.  Total time for H and P, chart review, counseling,  directly observing portions of ambulatory 02 saturation study/ and generating customized AVS unique to this office visit / same day charting = 60 min  new pwt eval with multiple  chronic refractory respiratory  symptoms of uncertain etiology.                       Vernestine Gondola, MD 10/18/2023

## 2023-10-16 ENCOUNTER — Other Ambulatory Visit: Payer: Self-pay | Admitting: Nurse Practitioner

## 2023-10-16 DIAGNOSIS — I10 Essential (primary) hypertension: Secondary | ICD-10-CM

## 2023-10-18 ENCOUNTER — Ambulatory Visit (INDEPENDENT_AMBULATORY_CARE_PROVIDER_SITE_OTHER): Admitting: Internal Medicine

## 2023-10-18 ENCOUNTER — Encounter: Payer: Self-pay | Admitting: Internal Medicine

## 2023-10-18 ENCOUNTER — Ambulatory Visit

## 2023-10-18 VITALS — BP 112/78 | HR 88 | Ht 64.0 in | Wt 218.0 lb

## 2023-10-18 DIAGNOSIS — Z87891 Personal history of nicotine dependence: Secondary | ICD-10-CM

## 2023-10-18 DIAGNOSIS — R059 Cough, unspecified: Secondary | ICD-10-CM | POA: Diagnosis not present

## 2023-10-18 DIAGNOSIS — R0609 Other forms of dyspnea: Secondary | ICD-10-CM

## 2023-10-18 DIAGNOSIS — R058 Other specified cough: Secondary | ICD-10-CM | POA: Diagnosis not present

## 2023-10-18 DIAGNOSIS — I771 Stricture of artery: Secondary | ICD-10-CM | POA: Diagnosis not present

## 2023-10-18 MED ORDER — PANTOPRAZOLE SODIUM 40 MG PO TBEC: 40.0000 mg | DELAYED_RELEASE_TABLET | Freq: Every day | ORAL | 2 refills | Status: DC

## 2023-10-18 MED ORDER — FAMOTIDINE 20 MG PO TABS: ORAL_TABLET | ORAL | 11 refills | Status: DC

## 2023-10-18 MED ORDER — BENZONATATE 200 MG PO CAPS: 200.0000 mg | ORAL_CAPSULE | Freq: Three times a day (TID) | ORAL | 2 refills | Status: AC | PRN

## 2023-10-18 NOTE — Patient Instructions (Addendum)
 Pantoprazole  (protonix ) 40 mg   Take  30-60 min before first meal of the day and Pepcid (famotidine)  20 mg after supper until return to office - this is the best way to tell whether stomach acid is contributing to your problem.   For drainage / throat tickle try take CHLORPHENIRAMINE  4 mg  (Allergy Relief 4mg   at [facility] should be easiest to find in the blue box usually on bottom shelf)  take one every 4 hours as needed - extremely effective and inexpensive over the counter- may cause drowsiness so start with just a dose or two an hour before bedtime and see how you tolerate it before trying in daytime.    GERD (REFLUX)  is an extremely common cause of respiratory symptoms just like yours , many times with no obvious heartburn at all.    It can be treated with medication, but also with lifestyle changes including elevation of the head of your bed (ideally with 6 -8inch blocks under the headboard of your bed),  Smoking cessation, avoidance of late meals, excessive alcohol, and avoid fatty foods, chocolate, peppermint, colas, red wine, and acidic juices such as orange juice.  NO MINT OR MENTHOL PRODUCTS SO NO COUGH DROPS - Luden's is the exception  USE SUGARLESS CANDY INSTEAD (Jolley ranchers or Stover's or Life Savers) or even ice chips will also do - the key is to swallow to prevent all throat clearing. NO OIL BASED VITAMINS - use powdered substitutes.  Avoid fish oil when coughing.   If still coughing try tessalon 200 mg every 4-6 hours with goal of no cough at all     Please remember to go to the  x-ray department  for your tests - we will call you with the results when they are available     Please remember to go to the lab department   for your tests - we will call you with the results when they are available.      Please schedule a follow up office visit in 8  weeks, sooner if needed

## 2023-10-19 DIAGNOSIS — R058 Other specified cough: Secondary | ICD-10-CM | POA: Insufficient documentation

## 2023-10-19 LAB — D-DIMER, QUANTITATIVE: D-Dimer, Quant: 0.41 ug{FEU}/mL (ref <0.50)

## 2023-10-19 LAB — BRAIN NATRIURETIC PEPTIDE: Brain Natriuretic Peptide: 97 pg/mL (ref <100)

## 2023-10-19 NOTE — Assessment & Plan Note (Addendum)
 Quit smoking 1988 s resp sequelae - Onset was p Jan 2025 in setting of cough x summer of 2024 and known HH  - 10/18/2023   Walked on RA  x  3  lap(s) =  approx 750  ft  @ rapid pace, stopped due to fatigue> sob  with lowest 02 sats 97%  - 10/18/2023 max gerd rx/ doe lab profile   Symptoms are markedly disproportionate to objective findings and not clear to what extent this is actually a pulmonary  problem but pt does appear to have difficult to sort out respiratory symptoms of unknown origin for which  DDX  = almost all start with A and  include Adherence, Ace Inhibitors, Acid Reflux, Active Sinus Disease, Alpha 1 Antitripsin deficiency, Anxiety masquerading as Airways dz,  ABPA,  Allergy(esp in young), Aspiration (esp in elderly), Adverse effects of meds,  Active smoking or Vaping, A bunch of PE's/clot burden (a few small clots can't cause this syndrome unless there is already severe underlying pulm or vascular dz with poor reserve),  Anemia or thyroid  disorder, plus two Bs  = Bronchiectasis and Beta blocker use..and one C= CHF    Most likely dx' are bolded   ? Acid (or non-acid) GERD > always difficult to exclude as up to 75% of pts in some series report no assoc GI/ Heartburn symptoms> rec max (24h)  acid suppression and diet restrictions/ reviewed and instructions given in writing.   ? Anxiety/despression/ deconditioning  > usually at the bottom of this list of usual suspects but  may interfere with adherence and also interpretation of response or lack thereof to symptom management which can be quite subjective.   ? Allergy/ asthma > Eos 0.1/ IGE 97 > clinically  seems unlikely / probably needs f/u pfts though if not improving   ? Active sinus rhinitis with pnds >  see above labs/ try add hs  1st gen H1 blockers per guidelines     ? A bunch of PEs>   D dimer nl - while a normal  or high normal value (seen commonly in the elderly or chronically ill)  may miss small peripheral pe, the clot burden  with sob is moderately high and the d dimer  has a very high neg pred value if used in this setting.    ? Chf >  BNP less than 100/ cxr not suggestive   Rec regular sub max ex up to 30 min daily as tol and f/u in 6-8 weeks, sooner prn

## 2023-10-19 NOTE — Assessment & Plan Note (Signed)
 Onset same time as doe = fall 2024 with choking sensation worse with meals/ persistent globus - neg MBS 09/05/23  - 10/18/2023 trial of max gerd rx and 1st gen H1 blockers per guidelines    If not improved consider ENT eval/ Speech therapy with Daphney Eans.  Discussed in detail all the  indications, usual  risks and alternatives  relative to the benefits with patient who agrees to proceed with Rx as outlined.      Each maintenance medication was reviewed in detail including emphasizing most importantly the difference between maintenance and prns and under what circumstances the prns are to be triggered using an action plan format where appropriate.  Total time for H and P, chart review, counseling,  directly observing portions of ambulatory 02 saturation study/ and generating customized AVS unique to this office visit / same day charting = 60 min  new pwt eval with multiple  chronic refractory respiratory  symptoms of uncertain etiology.

## 2023-10-20 ENCOUNTER — Ambulatory Visit: Payer: Self-pay | Admitting: Internal Medicine

## 2023-11-14 ENCOUNTER — Other Ambulatory Visit: Payer: Self-pay | Admitting: Nurse Practitioner

## 2023-11-14 DIAGNOSIS — E785 Hyperlipidemia, unspecified: Secondary | ICD-10-CM

## 2023-11-20 DIAGNOSIS — N3090 Cystitis, unspecified without hematuria: Secondary | ICD-10-CM | POA: Diagnosis not present

## 2023-11-20 DIAGNOSIS — R350 Frequency of micturition: Secondary | ICD-10-CM | POA: Diagnosis not present

## 2023-11-30 ENCOUNTER — Other Ambulatory Visit: Payer: Self-pay | Admitting: Nurse Practitioner

## 2023-11-30 NOTE — Telephone Encounter (Signed)
 Medium risk warning populated when attempting to refill medication  Please review and approval if appropriate   Thanks,  [facility] W/CMA

## 2023-12-04 DIAGNOSIS — E1122 Type 2 diabetes mellitus with diabetic chronic kidney disease: Secondary | ICD-10-CM | POA: Diagnosis not present

## 2023-12-04 DIAGNOSIS — N183 Chronic kidney disease, stage 3 unspecified: Secondary | ICD-10-CM | POA: Diagnosis not present

## 2023-12-04 DIAGNOSIS — R309 Painful micturition, unspecified: Secondary | ICD-10-CM | POA: Diagnosis not present

## 2023-12-12 ENCOUNTER — Other Ambulatory Visit: Payer: Self-pay

## 2023-12-12 ENCOUNTER — Other Ambulatory Visit

## 2023-12-12 DIAGNOSIS — E1140 Type 2 diabetes mellitus with diabetic neuropathy, unspecified: Secondary | ICD-10-CM

## 2023-12-12 NOTE — Telephone Encounter (Signed)
 Spoke with patient and she stated the dentist office prescribed Zithromax instead,and she would talk to [name], NP about it and her upcoming appointment.

## 2023-12-12 NOTE — Telephone Encounter (Signed)
 I have never sent this Rx in, we can talk about it at her next appt.  Does her dentist normally prescribe?

## 2023-12-12 NOTE — Telephone Encounter (Signed)
 Patient was here for lab and stated she needed antibiotic ,for dental procedure.

## 2023-12-13 LAB — HEMOGLOBIN A1C
Hgb A1c MFr Bld: 6.9 % — ABNORMAL HIGH (ref <5.7)
Mean Plasma Glucose: 151 mg/dL
eAG (mmol/L): 8.4 mmol/L

## 2023-12-15 ENCOUNTER — Encounter: Payer: Self-pay | Admitting: Nurse Practitioner

## 2023-12-15 ENCOUNTER — Ambulatory Visit (INDEPENDENT_AMBULATORY_CARE_PROVIDER_SITE_OTHER): Admitting: Nurse Practitioner

## 2023-12-15 VITALS — BP 120/60 | HR 84 | Temp 97.0°F | Resp 17 | Ht 64.0 in | Wt 211.2 lb

## 2023-12-15 DIAGNOSIS — N1832 Chronic kidney disease, stage 3b: Secondary | ICD-10-CM | POA: Insufficient documentation

## 2023-12-15 DIAGNOSIS — R350 Frequency of micturition: Secondary | ICD-10-CM

## 2023-12-15 DIAGNOSIS — R053 Chronic cough: Secondary | ICD-10-CM | POA: Diagnosis not present

## 2023-12-15 DIAGNOSIS — I10 Essential (primary) hypertension: Secondary | ICD-10-CM | POA: Diagnosis not present

## 2023-12-15 DIAGNOSIS — E1140 Type 2 diabetes mellitus with diabetic neuropathy, unspecified: Secondary | ICD-10-CM | POA: Diagnosis not present

## 2023-12-15 DIAGNOSIS — F339 Major depressive disorder, recurrent, unspecified: Secondary | ICD-10-CM

## 2023-12-15 NOTE — Assessment & Plan Note (Signed)
 Blood pressure well controlled, goal bp <140/90 Continue current medications and dietary modifications follow metabolic panel

## 2023-12-15 NOTE — Assessment & Plan Note (Signed)
 Chronic and stable Encourage proper hydration Follow metabolic panel Avoid nephrotoxic meds (NSAIDS) Followed by nephrology

## 2023-12-15 NOTE — Assessment & Plan Note (Signed)
 Stable on paxil

## 2023-12-15 NOTE — Assessment & Plan Note (Signed)
-  has had weight loss through diet and exercise- continue on healthy weight loss through increase in physical activity and proper nutrition

## 2023-12-15 NOTE — Assessment & Plan Note (Signed)
 Now at goal with dietary modifications. Continue with dietary compliance, medication and encouraged routine foot care/monitoring and to keep up with diabetic eye exams through ophthalmology

## 2023-12-15 NOTE — Assessment & Plan Note (Signed)
 Thought to be related to GERD, plans to start protonix  soon.

## 2023-12-15 NOTE — Progress Notes (Signed)
 Careteam: Patient Care Team: [name], NP as PCP - General (Geriatric Medicine) Lavona Agent, MD as PCP - Cardiology (Cardiology) Mincey, Prentice Clack, MD as Consulting Physician (Ophthalmology) Gearline Norris, MD as Consulting Physician (Nephrology) [facility]  PLACE OF SERVICE:  [facility]  Advanced Directive information Does Patient Have a Medical Advance Directive?: Yes, Type of Advance Directive: Healthcare Power of Trooper;Living will, Does patient want to make changes to medical advance directive?: No - Patient declined  Allergies  Allergen Reactions   Penicillins     Rash    Ivp Dye [Iodinated Contrast Media]     Hives    Percocet [Oxycodone -Acetaminophen ]     Itching     Chief Complaint  Patient presents with   Medical Management of Chronic Issues    3 month follow up.     HPI:  Discussed the use of AI scribe software for clinical note transcription with the patient, who gave verbal consent to proceed.  History of Present Illness Jill Garza is a 73 year old female who presents for a three-month follow-up for palpitations and dyspnea on exertion.  She experiences palpitations described as a 'zzz through my chest', causing more worry than discomfort. No chest pains.  She has completed the event monitor and has a cardiology appointment scheduled for the end of the month.  She has dyspnea on exertion, previously evaluated and attributed to GERD. She is taking Pepcid  and plans to switch to Protonix  after finishing her current supply. Her weight has decreased from 225 lbs in April to 211 lbs currently.  Her A1c was previously elevated, but dietary changes and increased exercise have improved it to 6.9. She is taking glipizide  5 mg twice a day and is not on metformin  due to kidney function.  She reports two urinary tract infections in the past month, treated with cephalexin, but symptoms have recurred. Symptoms include frequency,  urgency, and dysuria, with urine cultures not showing significant growth. She has been drinking less due to pain in mouth with recent mouth surgery. She was prescribed azithromycin for dental prophylaxis instead of her usual Cleocin .  She underwent a swallow evaluation due to episodes of feeling choked, but no recommendations were made. She believes controlling her acid reflux may help with these symptoms.    Review of Systems:  Review of Systems  Constitutional:  Negative for chills, fever and weight loss.  HENT:  Negative for tinnitus.   Respiratory:  Negative for cough, sputum production and shortness of breath.   Cardiovascular:  Negative for chest pain, palpitations and leg swelling.  Gastrointestinal:  Negative for abdominal pain, constipation, diarrhea and heartburn.  Genitourinary:  Negative for dysuria, frequency and urgency.  Musculoskeletal:  Negative for back pain, falls, joint pain and myalgias.  Skin: Negative.   Neurological:  Negative for dizziness and headaches.  Psychiatric/Behavioral:  Negative for depression and memory loss. The patient does not have insomnia.     Past Medical History:  Diagnosis Date   Arthritis    Cancer (HCC)    melanoma- right leg   Chronic kidney disease    Depression    GERD (gastroesophageal reflux disease)    pt denies    History of hiatal hernia    Hypercholesterolemia    Hyperparathyroidism (HCC)    Hypertension    Sweat, sweating, excessive    Type 2 diabetes mellitus (HCC)    Past Surgical History:  Procedure Laterality Date   ABDOMINAL HYSTERECTOMY  1979  APPENDECTOMY  1964   BREAST EXCISIONAL BIOPSY Left 08/2014   benign   BREAST LUMPECTOMY WITH RADIOACTIVE SEED LOCALIZATION Left 12/31/2014   Procedure: LEFT BREAST LUMPECTOMY WITH RADIOACTIVE SEED LOCALIZATION;  Surgeon: Donnice Lima, MD;  Location: [facility];  Service: General;  Laterality: Left;   CARPAL TUNNEL RELEASE Right 1977   CARPAL TUNNEL RELEASE  Left 2007   CATARACT EXTRACTION Left 04/13/2014   Dr. Cleatus   CATARACT EXTRACTION Right 06/10/2014   Dr. Cleatus   CHOLECYSTECTOMY N/A 07/14/2017   Procedure: LAPAROSCOPIC CHOLECYSTECTOMY;  Surgeon: Teresa Lonni HERO, MD;  Location: [facility] OR;  Service: General;  Laterality: N/A;   COLONOSCOPY  2009   JOINT REPLACEMENT     MELANOMA EXCISION     PARATHYROIDECTOMY N/A 09/10/2020   Procedure: PARATHYROIDECTOMY;  Surgeon: Eletha Boas, MD;  Location: [facility];  Service: General;  Laterality: N/A;   THYROID  EXPLORATION N/A 09/10/2020   Procedure: POSSIBLE NECK EXPLORATION;  Surgeon: Eletha Boas, MD;  Location: [facility];  Service: General;  Laterality: N/A;   TONSILLECTOMY  1962   TOTAL KNEE ARTHROPLASTY Left 2012   TOTAL KNEE ARTHROPLASTY Right 2008   Social History:   reports that she quit smoking about 37 years ago. Her smoking use included cigarettes. She started smoking about 52 years ago. She has a 15 pack-year smoking history. She has never used smokeless tobacco. She reports current alcohol use. She reports that she does not use drugs.  Family History  Problem Relation Age of Onset   Pulmonary embolism Mother        Multiple, B/L    Heart attack Father        Died of MI age 17   Hypertension Sister    Diabetes type II Sister    High Cholesterol Sister    Deep vein thrombosis Sister    Hypertension Brother    Heart disease Brother 61   Diabetes Brother    High Cholesterol Brother    Hypertension Brother    High Cholesterol Brother    Alcohol abuse Brother    Deep vein thrombosis Brother    Atrial fibrillation Brother    Hepatitis Brother    Cancer Maternal Grandmother 38   Reye's syndrome Daughter    Stroke Other    Colon cancer Neg Hx    Esophageal cancer Neg Hx    Stomach cancer Neg Hx    Rectal cancer Neg Hx     Medications: Patient's Medications  New Prescriptions   No medications on file  Previous Medications   ACETAMINOPHEN  (TYLENOL ) 500 MG TABLET    Take 500  mg by mouth as needed (For headache).   AMLODIPINE  (NORVASC ) 5 MG TABLET    Take 1 tablet by mouth once daily   BENZONATATE  (TESSALON ) 200 MG CAPSULE    Take 1 capsule (200 mg total) by mouth 3 (three) times daily as needed for cough.   CHLORHEXIDINE  (PERIDEX ) 0.12 % SOLUTION    Use as directed 15 mLs in the mouth or throat 2 (two) times daily.   CLINDAMYCIN  (CLEOCIN ) 300 MG CAPSULE    4 tablets prior to dental work   FAMOTIDINE  (PEPCID ) 20 MG TABLET    One after supper   FENOFIBRATE  (TRICOR ) 145 MG TABLET    TAKE ONE TABLET BY MOUTH ONCE A DAY   GLIPIZIDE  (GLUCOTROL ) 5 MG TABLET    TAKE 1 TABLET BY MOUTH TWICE DAILY BEFORE MEAL(S)   LOSARTAN  (COZAAR ) 100 MG TABLET  Take 1 tablet by mouth once daily   PANTOPRAZOLE  (PROTONIX ) 40 MG TABLET    Take 1 tablet (40 mg total) by mouth daily. Take 30-60 min before first meal of the day   PAROXETINE  (PAXIL ) 30 MG TABLET    Take 1 tablet by mouth once daily   ROSUVASTATIN  (CRESTOR ) 5 MG TABLET    TAKE 1 TABLET BY MOUTH THREE TIMES A WEEK  Modified Medications   No medications on file  Discontinued Medications   No medications on file    Physical Exam:  Vitals:   12/15/23 0943  BP: 120/60  Pulse: 84  Resp: 17  Temp: (!) 97 F (36.1 C)  SpO2: 95%  Weight: 211 lb 3.2 oz (95.8 kg)  Height: 5' 4 (1.626 m)   Body mass index is 36.25 kg/m. Wt Readings from Last 3 Encounters:  12/15/23 211 lb 3.2 oz (95.8 kg)  10/18/23 218 lb (98.9 kg)  08/18/23 225 lb (102.1 kg)    Physical Exam Constitutional:      General: She is not in acute distress.    Appearance: She is well-developed. She is not diaphoretic.  HENT:     Head: Normocephalic and atraumatic.     Mouth/Throat:     Pharynx: No oropharyngeal exudate.  Eyes:     Conjunctiva/sclera: Conjunctivae normal.     Pupils: Pupils are equal, round, and reactive to light.  Cardiovascular:     Rate and Rhythm: Normal rate and regular rhythm.     Heart sounds: Normal heart sounds.  Pulmonary:      Effort: Pulmonary effort is normal.     Breath sounds: Normal breath sounds.  Abdominal:     General: Bowel sounds are normal.     Palpations: Abdomen is soft.  Musculoskeletal:     Cervical back: Normal range of motion and neck supple.     Right lower leg: No edema.     Left lower leg: No edema.  Skin:    General: Skin is warm and dry.  Neurological:     Mental Status: She is alert.  Psychiatric:        Mood and Affect: Mood normal.     Labs reviewed: Basic Metabolic Panel: Recent Labs    02/14/23 0803 08/15/23 0811 09/05/23 0000  NA 138 138 142  K 4.4 4.7 3.9  CL 104 101 105  CO2 26 29 26*  GLUCOSE 119* 146*  --   BUN 24 25 24*  CREATININE 1.29* 1.61* 1.3*  CALCIUM  9.7 10.1 9.6  TSH  --  1.92  --    Liver Function Tests: Recent Labs    02/14/23 0803 08/15/23 0811 09/05/23 0000  AST 16 17  --   ALT 16 16  --   BILITOT 0.5 0.4  --   PROT 6.9 7.1  --   ALBUMIN  --   --  4.3   No results for input(s): LIPASE, AMYLASE in the last 8760 hours. No results for input(s): AMMONIA in the last 8760 hours. CBC: Recent Labs    02/14/23 0803 08/15/23 0811  WBC 5.8 5.7  NEUTROABS 2,906 3,021  HGB 13.8 13.9  HCT 43.8 42.0  MCV 90.3 85.5  PLT 285 287   Lipid Panel: Recent Labs    02/14/23 0803 08/15/23 0811  CHOL 115 110  HDL 42* 42*  LDLCALC 50 49  TRIG 150* 108  CHOLHDL 2.7 2.6   TSH: Recent Labs    08/15/23 0811  TSH 1.92  A1C: Lab Results  Component Value Date   HGBA1C 6.9 (H) 12/12/2023     Assessment/Plan  Type 2 diabetes mellitus with diabetic neuropathy, without long-term current use of insulin (HCC) Assessment & Plan: Now at goal with dietary modifications. Continue with dietary compliance, medication and encouraged routine foot care/monitoring and to keep up with diabetic eye exams through ophthalmology     Essential hypertension, benign Assessment & Plan: Blood pressure well controlled, goal bp <140/90 Continue  current medications and dietary modifications follow metabolic panel   Stage 3b chronic kidney disease (HCC) Assessment & Plan: Chronic and stable Encourage proper hydration Follow metabolic panel Avoid nephrotoxic meds (NSAIDS) Followed by nephrology   Morbid obesity (HCC) Assessment & Plan: -has had weight loss through diet and exercise- continue on healthy weight loss through increase in physical activity and proper nutrition     Recurrent major depressive disorder, remission status unspecified (HCC) Assessment & Plan: Stable on paxil    Chronic cough Assessment & Plan: Thought to be related to GERD, plans to start protonix  soon.    Urine frequency -     Urine Culture -     POCT urinalysis dipstick Improved symptoms at this time however has been treated for 2 UTI in the last month with classic symptoms of burning, frequency and urgency. Started to have symptoms yesterday but improved at this time Will follow up urine at this time, may need urology referral if continues.     Return in about 6 months (around 06/16/2024) for routine follow up, labs prior to visit.  Jill Garza K. Caro BODILY [facility] [phone]

## 2023-12-18 DIAGNOSIS — R350 Frequency of micturition: Secondary | ICD-10-CM | POA: Diagnosis not present

## 2023-12-19 LAB — URINE CULTURE
MICRO NUMBER:: 16814979
SPECIMEN QUALITY:: ADEQUATE

## 2023-12-20 ENCOUNTER — Ambulatory Visit: Payer: Self-pay | Admitting: Nurse Practitioner

## 2024-01-02 ENCOUNTER — Other Ambulatory Visit: Payer: Self-pay

## 2024-01-02 DIAGNOSIS — E785 Hyperlipidemia, unspecified: Secondary | ICD-10-CM

## 2024-01-02 MED ORDER — ROSUVASTATIN CALCIUM 5 MG PO TABS: 5.0000 mg | ORAL_TABLET | ORAL | 1 refills | Status: AC

## 2024-01-04 ENCOUNTER — Other Ambulatory Visit: Payer: Self-pay | Admitting: Nurse Practitioner

## 2024-01-04 DIAGNOSIS — Z1231 Encounter for screening mammogram for malignant neoplasm of breast: Secondary | ICD-10-CM

## 2024-01-05 ENCOUNTER — Encounter: Payer: Self-pay | Admitting: Cardiology

## 2024-01-05 ENCOUNTER — Other Ambulatory Visit (HOSPITAL_COMMUNITY): Payer: Self-pay

## 2024-01-05 ENCOUNTER — Ambulatory Visit: Attending: Cardiology | Admitting: Cardiology

## 2024-01-05 VITALS — BP 110/58 | HR 80 | Ht 64.0 in | Wt 209.8 lb

## 2024-01-05 DIAGNOSIS — R072 Precordial pain: Secondary | ICD-10-CM | POA: Insufficient documentation

## 2024-01-05 DIAGNOSIS — D2271 Melanocytic nevi of right lower limb, including hip: Secondary | ICD-10-CM | POA: Diagnosis not present

## 2024-01-05 DIAGNOSIS — L728 Other follicular cysts of the skin and subcutaneous tissue: Secondary | ICD-10-CM | POA: Diagnosis not present

## 2024-01-05 DIAGNOSIS — L821 Other seborrheic keratosis: Secondary | ICD-10-CM | POA: Diagnosis not present

## 2024-01-05 DIAGNOSIS — R002 Palpitations: Secondary | ICD-10-CM | POA: Insufficient documentation

## 2024-01-05 DIAGNOSIS — Z8249 Family history of ischemic heart disease and other diseases of the circulatory system: Secondary | ICD-10-CM | POA: Insufficient documentation

## 2024-01-05 DIAGNOSIS — Z08 Encounter for follow-up examination after completed treatment for malignant neoplasm: Secondary | ICD-10-CM | POA: Diagnosis not present

## 2024-01-05 DIAGNOSIS — S50861A Insect bite (nonvenomous) of right forearm, initial encounter: Secondary | ICD-10-CM | POA: Diagnosis not present

## 2024-01-05 DIAGNOSIS — D225 Melanocytic nevi of trunk: Secondary | ICD-10-CM | POA: Diagnosis not present

## 2024-01-05 DIAGNOSIS — Z85820 Personal history of malignant melanoma of skin: Secondary | ICD-10-CM | POA: Diagnosis not present

## 2024-01-05 DIAGNOSIS — L814 Other melanin hyperpigmentation: Secondary | ICD-10-CM | POA: Diagnosis not present

## 2024-01-05 MED ORDER — DILTIAZEM HCL 30 MG PO TABS
30.0000 mg | ORAL_TABLET | Freq: Three times a day (TID) | ORAL | 3 refills | Status: DC | PRN
  Filled 2024-01-05: qty 90, 30d supply, fill #0

## 2024-01-05 NOTE — Patient Instructions (Signed)
 Medication Instructions:  START Diltiazem  30 mg three times a day as needed  *If you need a refill on your cardiac medications before your next appointment, please call your pharmacy*  Testing/Procedures: Echocardiogram   Your physician has requested that you have an echocardiogram. Echocardiography is a painless test that uses sound waves to create images of your heart. It provides your doctor with information about the size and shape of your heart and how well your heart's chambers and valves are working. This procedure takes approximately one hour. There are no restrictions for this procedure. Please do NOT wear cologne, perfume, aftershave, or lotions (deodorant is allowed). Please arrive 15 minutes prior to your appointment time.  Please note: We ask at that you not bring children with you during ultrasound (echo/ vascular) testing. Due to room size and safety concerns, children are not allowed in the ultrasound rooms during exams. Our front office staff cannot provide observation of children in our lobby area while testing is being conducted. An adult accompanying a patient to their appointment will only be allowed in the ultrasound room at the discretion of the ultrasound technician under special circumstances. We apologize for any inconvenience.   Exercise Nuclear Stress Test   Your physician has requested that you have en exercise stress myoview. For further information please visit [email or web address] Please follow instruction sheet, as given.   Follow-Up: At [facility], you and your health needs are our priority.  As part of our continuing mission to provide you with exceptional heart care, our providers are all part of one team.  This team includes your primary Cardiologist (physician) and Advanced Practice Providers or APPs (Physician Assistants and Nurse Practitioners) who all work together to provide you with the care you need, when you need it.  Your next  appointment:   6 month(s)  Provider:   APP OR DR. PATWARDHAN

## 2024-01-05 NOTE — Progress Notes (Signed)
 Cardiology Office Note:  .   Date:  01/05/2024  ID:  [name], [name] [birth year], MRN [id number] PCP: [name], NP  [facility] Providers Cardiologist:  Newman Lawrence, MD PCP: [name], NP  Chief Complaint  Patient presents with   Chest Pain     Jill Garza is a 73 y.o. female with family history of early CAD, now with chest pain, palpitations  Discussed the use of AI scribe software for clinical note transcription with the patient, who gave verbal consent to proceed.  History of Present Illness Jill Garza is a 73 year old female who presents with concerns about cardiac symptoms.  She experiences sharp chest pain lasting seconds, occurring three to four times while sitting, not during physical activity. She also has fluttering sensations in her chest lasting up to 20 minutes, sometimes causing shortness of breath.  Her family history includes a brother with myocardial infarction and triple bypass at age 17, another brother with atrial fibrillation, and a mother with bilateral pulmonary embolisms.  She had a normal stress test in 2019 and was prescribed propranolol  in 2020 for similar chest pain symptoms. She currently takes Crestor  5 mg for cholesterol management.  She is not very active, primarily doing housework, and has become more of a 'shut-in' since COVID. She does not smoke and drinks alcohol occasionally, with a maximum of five margaritas a year.      Vitals:   01/05/24 1359  BP: (!) 110/58  Pulse: 80  SpO2: 98%      Review of Systems  Cardiovascular:  Positive for chest pain and palpitations. Negative for dyspnea on exertion, leg swelling and syncope.        Studies Reviewed: SABRA        EKG 01/05/2024: Normal sinus rhythm Right bundle branch block When compared with ECG of 07-Sep-2020 11:29, No significant change was found    Monitor 09/2023: NSR with sinus brady (53/min) and sinus tachy (118/min),  ave 77/min. 39 brief episodes of SVT, fastest and longest 13 seconds at an ave rate of 151/min No VT or atrial fib. No prolonged pauses. Rare PVC's and PAC's (<1%)  Symptoms associated with PVC's. Patch Wear Time:  13 days and 3 hours (2025-04-16T12:18:31-0400 to 2025-04-29T16:14:32-0400)      Labs 08/2023: Chol 110, TG 108, HDL 42, LDL 49 HbA1C 6.9% Hb 13.9 Cr 1.3 TSH 1.9   Physical Exam Vitals and nursing note reviewed.  Constitutional:      General: She is not in acute distress. Neck:     Vascular: No JVD.  Cardiovascular:     Rate and Rhythm: Normal rate and regular rhythm.     Heart sounds: Normal heart sounds. No murmur heard. Pulmonary:     Effort: Pulmonary effort is normal.     Breath sounds: Normal breath sounds. No wheezing or rales.  Musculoskeletal:     Right lower leg: No edema.     Left lower leg: No edema.      VISIT DIAGNOSES:   ICD-10-CM   1. Palpitations  R00.2 EKG 12-Lead    2. Family history of early CAD  Z53.49     3. Precordial pain  R07.2 Myocardial Perfusion Imaging    ECHOCARDIOGRAM COMPLETE    Cardiac Stress Test: Informed Consent Details: Physician/Practitioner Attestation; Transcribe to consent form and obtain patient signature       Jill Garza is a 73 y.o. female with  family history of early CAD,  now with chest pain, palpitations Assessment & Plan Paroxysmal supraventricular tachycardia with palpitations Episodes of rapid heartbeat from the atria, likely PSVT, with palpitations and occasional sharp chest pain. No atrial fibrillation. Symptoms occur at rest, not with activity. Vagal maneuvers effective. - Prescribe diltiazem  30 mg short acting as needed for episodes not relieved by vagal maneuvers. - Instruct on performing vagal maneuvers to terminate episodes.  Chest pain: Not typical of angina, but has strong risk factors with family history of early CAD. Recommend echocardiogram, exercise nuclear stress  test. Continue Crestor  5 mg daily, lipids well-controlled.     Informed Consent   Shared Decision Making/Informed Consent The risks [chest pain, shortness of breath, cardiac arrhythmias, dizziness, blood pressure fluctuations, myocardial infarction, stroke/transient ischemic attack, nausea, vomiting, allergic reaction, radiation exposure, metallic taste sensation and life-threatening complications (estimated to be 1 in 10,000)], benefits (risk stratification, diagnosing coronary artery disease, treatment guidance) and alternatives of a nuclear stress test were discussed in detail with [name] and she agrees to proceed.       No orders of the defined types were placed in this encounter.    F/u in 6 months  Signed, Newman JINNY Lawrence, MD

## 2024-01-12 ENCOUNTER — Other Ambulatory Visit: Payer: Self-pay | Admitting: Nurse Practitioner

## 2024-01-12 DIAGNOSIS — I10 Essential (primary) hypertension: Secondary | ICD-10-CM

## 2024-01-16 ENCOUNTER — Other Ambulatory Visit (HOSPITAL_COMMUNITY): Payer: Self-pay

## 2024-01-19 ENCOUNTER — Other Ambulatory Visit: Payer: Self-pay

## 2024-01-19 MED ORDER — SPIKEVAX 50 MCG/0.5ML IM SUSP: 0.5000 mL | INTRAMUSCULAR | 1 refills | Status: DC

## 2024-01-22 ENCOUNTER — Ambulatory Visit

## 2024-02-01 ENCOUNTER — Emergency Department (HOSPITAL_BASED_OUTPATIENT_CLINIC_OR_DEPARTMENT_OTHER)

## 2024-02-01 ENCOUNTER — Other Ambulatory Visit: Payer: Self-pay

## 2024-02-01 ENCOUNTER — Inpatient Hospital Stay (HOSPITAL_BASED_OUTPATIENT_CLINIC_OR_DEPARTMENT_OTHER)
Admission: EM | Admit: 2024-02-01 | Discharge: 2024-02-03 | DRG: 069 | Disposition: A | Discharge status: Home / Self Care | Attending: Family Medicine | Admitting: Family Medicine

## 2024-02-01 ENCOUNTER — Encounter (HOSPITAL_BASED_OUTPATIENT_CLINIC_OR_DEPARTMENT_OTHER): Payer: Self-pay

## 2024-02-01 DIAGNOSIS — Z85820 Personal history of malignant melanoma of skin: Secondary | ICD-10-CM

## 2024-02-01 DIAGNOSIS — Z833 Family history of diabetes mellitus: Secondary | ICD-10-CM | POA: Diagnosis not present

## 2024-02-01 DIAGNOSIS — R42 Dizziness and giddiness: Secondary | ICD-10-CM | POA: Diagnosis not present

## 2024-02-01 DIAGNOSIS — Z91041 Radiographic dye allergy status: Secondary | ICD-10-CM | POA: Diagnosis not present

## 2024-02-01 DIAGNOSIS — I700 Atherosclerosis of aorta: Secondary | ICD-10-CM | POA: Diagnosis not present

## 2024-02-01 DIAGNOSIS — G459 Transient cerebral ischemic attack, unspecified: Principal | ICD-10-CM | POA: Diagnosis present

## 2024-02-01 DIAGNOSIS — Z823 Family history of stroke: Secondary | ICD-10-CM

## 2024-02-01 DIAGNOSIS — R9089 Other abnormal findings on diagnostic imaging of central nervous system: Secondary | ICD-10-CM | POA: Diagnosis not present

## 2024-02-01 DIAGNOSIS — E213 Hyperparathyroidism, unspecified: Secondary | ICD-10-CM | POA: Diagnosis present

## 2024-02-01 DIAGNOSIS — W19XXXA Unspecified fall, initial encounter: Secondary | ICD-10-CM | POA: Diagnosis present

## 2024-02-01 DIAGNOSIS — K219 Gastro-esophageal reflux disease without esophagitis: Secondary | ICD-10-CM | POA: Diagnosis present

## 2024-02-01 DIAGNOSIS — E119 Type 2 diabetes mellitus without complications: Secondary | ICD-10-CM

## 2024-02-01 DIAGNOSIS — Y92009 Unspecified place in unspecified non-institutional (private) residence as the place of occurrence of the external cause: Secondary | ICD-10-CM

## 2024-02-01 DIAGNOSIS — I10 Essential (primary) hypertension: Secondary | ICD-10-CM | POA: Diagnosis present

## 2024-02-01 DIAGNOSIS — E7800 Pure hypercholesterolemia, unspecified: Secondary | ICD-10-CM | POA: Diagnosis present

## 2024-02-01 DIAGNOSIS — Z23 Encounter for immunization: Secondary | ICD-10-CM | POA: Diagnosis not present

## 2024-02-01 DIAGNOSIS — R296 Repeated falls: Secondary | ICD-10-CM | POA: Diagnosis present

## 2024-02-01 DIAGNOSIS — E66811 Obesity, class 1: Secondary | ICD-10-CM | POA: Diagnosis present

## 2024-02-01 DIAGNOSIS — Z880 Allergy status to penicillin: Secondary | ICD-10-CM

## 2024-02-01 DIAGNOSIS — Z6834 Body mass index (BMI) 34.0-34.9, adult: Secondary | ICD-10-CM | POA: Diagnosis not present

## 2024-02-01 DIAGNOSIS — Z7982 Long term (current) use of aspirin: Secondary | ICD-10-CM

## 2024-02-01 DIAGNOSIS — I771 Stricture of artery: Secondary | ICD-10-CM | POA: Diagnosis not present

## 2024-02-01 DIAGNOSIS — Z9841 Cataract extraction status, right eye: Secondary | ICD-10-CM

## 2024-02-01 DIAGNOSIS — Z9181 History of falling: Secondary | ICD-10-CM

## 2024-02-01 DIAGNOSIS — Z79899 Other long term (current) drug therapy: Secondary | ICD-10-CM | POA: Diagnosis not present

## 2024-02-01 DIAGNOSIS — I471 Supraventricular tachycardia, unspecified: Secondary | ICD-10-CM | POA: Diagnosis not present

## 2024-02-01 DIAGNOSIS — I129 Hypertensive chronic kidney disease with stage 1 through stage 4 chronic kidney disease, or unspecified chronic kidney disease: Secondary | ICD-10-CM | POA: Diagnosis present

## 2024-02-01 DIAGNOSIS — Z853 Personal history of malignant neoplasm of breast: Secondary | ICD-10-CM

## 2024-02-01 DIAGNOSIS — R112 Nausea with vomiting, unspecified: Secondary | ICD-10-CM | POA: Diagnosis not present

## 2024-02-01 DIAGNOSIS — I6782 Cerebral ischemia: Secondary | ICD-10-CM | POA: Diagnosis not present

## 2024-02-01 DIAGNOSIS — Z87891 Personal history of nicotine dependence: Secondary | ICD-10-CM

## 2024-02-01 DIAGNOSIS — Z83438 Family history of other disorder of lipoprotein metabolism and other lipidemia: Secondary | ICD-10-CM | POA: Diagnosis not present

## 2024-02-01 DIAGNOSIS — I639 Cerebral infarction, unspecified: Secondary | ICD-10-CM | POA: Diagnosis present

## 2024-02-01 DIAGNOSIS — R1111 Vomiting without nausea: Secondary | ICD-10-CM | POA: Diagnosis not present

## 2024-02-01 DIAGNOSIS — R29818 Other symptoms and signs involving the nervous system: Secondary | ICD-10-CM | POA: Diagnosis not present

## 2024-02-01 DIAGNOSIS — Z96653 Presence of artificial knee joint, bilateral: Secondary | ICD-10-CM | POA: Diagnosis present

## 2024-02-01 DIAGNOSIS — I6381 Other cerebral infarction due to occlusion or stenosis of small artery: Secondary | ICD-10-CM | POA: Diagnosis not present

## 2024-02-01 DIAGNOSIS — R29700 NIHSS score 0: Secondary | ICD-10-CM | POA: Diagnosis not present

## 2024-02-01 DIAGNOSIS — Z7902 Long term (current) use of antithrombotics/antiplatelets: Secondary | ICD-10-CM

## 2024-02-01 DIAGNOSIS — N1832 Chronic kidney disease, stage 3b: Secondary | ICD-10-CM | POA: Diagnosis not present

## 2024-02-01 DIAGNOSIS — F32A Depression, unspecified: Secondary | ICD-10-CM | POA: Diagnosis present

## 2024-02-01 DIAGNOSIS — E1122 Type 2 diabetes mellitus with diabetic chronic kidney disease: Secondary | ICD-10-CM | POA: Diagnosis present

## 2024-02-01 DIAGNOSIS — Z7984 Long term (current) use of oral hypoglycemic drugs: Secondary | ICD-10-CM | POA: Diagnosis not present

## 2024-02-01 DIAGNOSIS — R110 Nausea: Secondary | ICD-10-CM | POA: Diagnosis not present

## 2024-02-01 DIAGNOSIS — F419 Anxiety disorder, unspecified: Secondary | ICD-10-CM | POA: Diagnosis present

## 2024-02-01 DIAGNOSIS — E785 Hyperlipidemia, unspecified: Secondary | ICD-10-CM | POA: Diagnosis present

## 2024-02-01 DIAGNOSIS — Z9842 Cataract extraction status, left eye: Secondary | ICD-10-CM

## 2024-02-01 DIAGNOSIS — E7849 Other hyperlipidemia: Secondary | ICD-10-CM | POA: Diagnosis not present

## 2024-02-01 DIAGNOSIS — Z8249 Family history of ischemic heart disease and other diseases of the circulatory system: Secondary | ICD-10-CM | POA: Diagnosis not present

## 2024-02-01 DIAGNOSIS — R569 Unspecified convulsions: Secondary | ICD-10-CM | POA: Diagnosis not present

## 2024-02-01 DIAGNOSIS — I6389 Other cerebral infarction: Secondary | ICD-10-CM | POA: Diagnosis not present

## 2024-02-01 DIAGNOSIS — I63542 Cerebral infarction due to unspecified occlusion or stenosis of left cerebellar artery: Secondary | ICD-10-CM | POA: Diagnosis not present

## 2024-02-01 DIAGNOSIS — Z743 Need for continuous supervision: Secondary | ICD-10-CM | POA: Diagnosis not present

## 2024-02-01 DIAGNOSIS — F418 Other specified anxiety disorders: Secondary | ICD-10-CM | POA: Diagnosis present

## 2024-02-01 DIAGNOSIS — Z8673 Personal history of transient ischemic attack (TIA), and cerebral infarction without residual deficits: Secondary | ICD-10-CM

## 2024-02-01 DIAGNOSIS — Z885 Allergy status to narcotic agent status: Secondary | ICD-10-CM

## 2024-02-01 DIAGNOSIS — E1151 Type 2 diabetes mellitus with diabetic peripheral angiopathy without gangrene: Secondary | ICD-10-CM | POA: Diagnosis not present

## 2024-02-01 LAB — DIFFERENTIAL
Abs Immature Granulocytes: 0.02 K/uL (ref 0.00–0.07)
Basophils Absolute: 0.1 K/uL (ref 0.0–0.1)
Basophils Relative: 1 %
Eosinophils Absolute: 0.1 K/uL (ref 0.0–0.5)
Eosinophils Relative: 2 %
Immature Granulocytes: 0 %
Lymphocytes Relative: 30 %
Lymphs Abs: 2 K/uL (ref 0.7–4.0)
Monocytes Absolute: 0.3 K/uL (ref 0.1–1.0)
Monocytes Relative: 4 %
Neutro Abs: 4.3 K/uL (ref 1.7–7.7)
Neutrophils Relative %: 63 %

## 2024-02-01 LAB — CBC
HCT: 41.2 % (ref 36.0–46.0)
Hemoglobin: 13.4 g/dL (ref 12.0–15.0)
MCH: 28.3 pg (ref 26.0–34.0)
MCHC: 32.5 g/dL (ref 30.0–36.0)
MCV: 87.1 fL (ref 80.0–100.0)
Platelets: 300 K/uL (ref 150–400)
RBC: 4.73 MIL/uL (ref 3.87–5.11)
RDW: 12.3 % (ref 11.5–15.5)
WBC: 6.7 K/uL (ref 4.0–10.5)
nRBC: 0 % (ref 0.0–0.2)

## 2024-02-01 LAB — COMPREHENSIVE METABOLIC PANEL WITH GFR
ALT: 15 U/L (ref 0–44)
AST: 23 U/L (ref 15–41)
Albumin: 4.4 g/dL (ref 3.5–5.0)
Alkaline Phosphatase: 43 U/L (ref 38–126)
Anion gap: 13 (ref 5–15)
BUN: 23 mg/dL (ref 8–23)
CO2: 21 mmol/L — ABNORMAL LOW (ref 22–32)
Calcium: 10.5 mg/dL — ABNORMAL HIGH (ref 8.9–10.3)
Chloride: 104 mmol/L (ref 98–111)
Creatinine, Ser: 1.35 mg/dL — ABNORMAL HIGH (ref 0.44–1.00)
GFR, Estimated: 41 mL/min — ABNORMAL LOW (ref >60)
Glucose, Bld: 153 mg/dL — ABNORMAL HIGH (ref 70–99)
Potassium: 4.3 mmol/L (ref 3.5–5.1)
Sodium: 139 mmol/L (ref 135–145)
Total Bilirubin: 0.5 mg/dL (ref 0.0–1.2)
Total Protein: 7.6 g/dL (ref 6.5–8.1)

## 2024-02-01 MED ORDER — METHYLPREDNISOLONE SODIUM SUCC 40 MG IJ SOLR
40.0000 mg | Freq: Once | INTRAMUSCULAR | Status: AC
  Administered 2024-02-01: 40 mg via INTRAVENOUS
  Filled 2024-02-01: qty 1

## 2024-02-01 MED ORDER — IOHEXOL 350 MG/ML SOLN
60.0000 mL | Freq: Once | INTRAVENOUS | Status: AC | PRN
  Administered 2024-02-01: 60 mL via INTRAVENOUS

## 2024-02-01 MED ORDER — DIPHENHYDRAMINE HCL 50 MG/ML IJ SOLN: 50.0000 mg | Freq: Once | INTRAMUSCULAR | Status: AC

## 2024-02-01 MED ORDER — ASPIRIN 325 MG PO TBEC
325.0000 mg | DELAYED_RELEASE_TABLET | Freq: Once | ORAL | Status: AC
  Administered 2024-02-01: 325 mg via ORAL
  Filled 2024-02-01: qty 1

## 2024-02-01 MED ORDER — DIPHENHYDRAMINE HCL 25 MG PO CAPS
50.0000 mg | ORAL_CAPSULE | Freq: Once | ORAL | Status: AC
  Administered 2024-02-01: 50 mg via ORAL
  Filled 2024-02-01: qty 2

## 2024-02-01 NOTE — ED Triage Notes (Signed)
 Episode of dizziness with sudden onset of nausea and diaphoresis at 1000 this am and a similar episode last week. All symptoms except mild nausea have resolved. Both episodes resolved spontaneously but the recovery today took longer. Has been experiencing intermittent over the last couple of years. Had previous CT head that was negative.

## 2024-02-01 NOTE — ED Provider Notes (Signed)
 [place] EMERGENCY DEPARTMENT AT [facility] Provider Note   CSN: [id number] Arrival date & time: 02/01/24  1258     Patient presents with: Dizziness   Jill Garza is a 73 y.o. female.  73 year old female presents ED with complaints of nausea, diaphoresis and dizziness at 10 AM this morning.  Patient advises she had a similar incident a week ago where she became very dizzy unstable and diaphoretic and family had to hold her and she fell back and hit her head.  Patient denies any injury or wound to the head at the time.  Patient reports the incident today was similar she was walking to the house and became very dizzy and diaphoretic then laid down for roughly 15 minutes with relief of symptoms.  She called her husband and he advised that she sounded like she had slurred speech on the phone.  Patient advises since she has been to the ED she feels mildly nauseous but no other symptoms.  Patient significant history of hyperlipidemia, hypertension, depression, CKD stage IIIb, type 2 diabetes, GERD.     Prior to Admission medications   Medication Sig Start Date End Date Taking? Authorizing Provider  acetaminophen  (TYLENOL ) 500 MG tablet Take 500 mg by mouth as needed (For headache).   Yes [provider]  amLODipine  (NORVASC ) 5 MG tablet Take 1 tablet by mouth once daily 01/12/24  Yes Eubanks, Jessica K, NP  benzonatate  (TESSALON ) 200 MG capsule Take 1 capsule (200 mg total) by mouth 3 (three) times daily as needed for cough. 10/18/23  Yes Darlean Ozell NOVAK, MD  fenofibrate  (TRICOR ) 145 MG tablet TAKE ONE TABLET BY MOUTH ONCE A DAY 11/14/23  Yes Eubanks, Jessica K, NP  glipiZIDE  (GLUCOTROL ) 5 MG tablet TAKE 1 TABLET BY MOUTH TWICE DAILY BEFORE MEAL(S) 09/18/23  Yes Eubanks, Jessica K, NP  losartan  (COZAAR ) 100 MG tablet Take 1 tablet by mouth once daily 09/18/23  Yes Eubanks, Jessica K, NP  omeprazole  (PRILOSEC) 20 MG capsule Take 20 mg by mouth daily.   Yes [provider]  rosuvastatin  (CRESTOR ) 5 MG tablet Take 1 tablet (5 mg total) by mouth 3 (three) times a week. 01/03/24  Yes Eubanks, Jessica K, NP  triamcinolone cream (KENALOG) 0.1 % Apply 1 Application topically as directed. 01/05/24  Yes [provider]  COVID-19 mRNA vaccine, Moderna, >/= 24yrs, (SPIKEVAX ) injection Inject 0.5 mLs into the muscle as directed. 01/19/24   [name], NP  diltiazem  (CARDIZEM ) 30 MG tablet Take 1 tablet (30 mg total) by mouth 3 (three) times daily as needed. 01/05/24   Patwardhan, Newman PARAS, MD  famotidine  (PEPCID ) 20 MG tablet One after supper Patient not taking: Reported on 02/01/2024 10/18/23   Darlean Ozell NOVAK, MD  pantoprazole  (PROTONIX ) 40 MG tablet Take 1 tablet (40 mg total) by mouth daily. Take 30-60 min before first meal of the day Patient not taking: Reported on 02/01/2024 10/18/23   Darlean Ozell NOVAK, MD    Allergies: Penicillins, Ivp dye [iodinated contrast media], and Percocet [oxycodone -acetaminophen ]    Review of Systems  Musculoskeletal:  Positive for gait problem.  Neurological:  Positive for dizziness and speech difficulty.  All other systems reviewed and are negative.   Updated Vital Signs BP (!) 170/74 (BP Location: Left Arm)   Pulse 72   Temp 97.8 F (36.6 C) (Oral)   Resp (!) 25   Ht 5' 4 (1.626 m)   Wt 90.7 kg   SpO2 96%   BMI  34.33 kg/m   Physical Exam Vitals and nursing note reviewed.  Constitutional:      General: She is not in acute distress.    Appearance: Normal appearance. She is not ill-appearing.  HENT:     Head: Normocephalic and atraumatic.  Eyes:     Extraocular Movements: Extraocular movements intact.     Pupils: Pupils are equal, round, and reactive to light.  Cardiovascular:     Rate and Rhythm: Normal rate.  Pulmonary:     Effort: Pulmonary effort is normal. No respiratory distress.     Breath sounds: Normal breath sounds.  Abdominal:     Tenderness: There is no guarding.  Musculoskeletal:         General: No swelling or tenderness. Normal range of motion.     Cervical back: Normal range of motion. No rigidity or tenderness.  Skin:    General: Skin is warm and dry.     Capillary Refill: Capillary refill takes less than 2 seconds.  Neurological:     General: No focal deficit present.     Mental Status: She is alert and oriented to person, place, and time.  Psychiatric:        Mood and Affect: Mood normal.        Behavior: Behavior normal.     (all labs ordered are listed, but only abnormal results are displayed) Labs Reviewed  COMPREHENSIVE METABOLIC PANEL WITH GFR - Abnormal; Notable for the following components:      Result Value   CO2 21 (*)    Glucose, Bld 153 (*)    Creatinine, Ser 1.35 (*)    Calcium  10.5 (*)    GFR, Estimated 41 (*)    All other components within normal limits  CBC  DIFFERENTIAL  PROTIME-INR  APTT    EKG: None  Radiology: CT Head Wo Contrast Result Date: 02/01/2024 CLINICAL DATA:  Neuro deficit, acute, stroke suspected dizziness EXAM: CT HEAD WITHOUT CONTRAST TECHNIQUE: Contiguous axial images were obtained from the base of the skull through the vertex without intravenous contrast. RADIATION DOSE REDUCTION: This exam was performed according to the departmental dose-optimization program which includes automated exposure control, adjustment of the mA and/or kV according to patient size and/or use of iterative reconstruction technique. COMPARISON:  MRI head October 17, 2020. FINDINGS: Brain: No evidence of acute infarction, hemorrhage, hydrocephalus, extra-axial collection or mass lesion/mass effect. Patchy white matter hypodensities are nonspecific but compatible with chronic microvascular ischemic change. Small remote appearing cerebellar infarcts. Vascular: No hyperdense vessel identified. Skull: No acute fracture. Sinuses/Orbits: Clear sinuses.  No acute orbital findings. IMPRESSION: 1. No evidence of acute intracranial abnormality. 2. 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 Electronically Signed   By: Gilmore GORMAN Molt M.D.   On: 02/01/2024 14:09     Procedures   Medications Ordered in the ED  diphenhydrAMINE  (BENADRYL ) capsule 50 mg (has no administration in time range)    Or  diphenhydrAMINE  (BENADRYL ) injection 50 mg (has no administration in time range)  aspirin  EC tablet 325 mg (325 mg Oral Given 02/01/24 1654)  methylPREDNISolone  sodium succinate (SOLU-MEDROL ) 40 mg/mL injection 40 mg (40 mg Intravenous Given 02/01/24 1652)    73 y.o. female presents to the ED with complaints of nausea, diaphoresis, dizzy, speech difficulty at 10 AM today., this involves an extensive number of treatment options, and is a complaint that carries with it a high risk of complications and morbidity.  The differential diagnosis includes CVA, TIA, intracranial  hemorrhage, intracranial mass, subarachnoid hemorrhage, dysrhythmia, ACS, (Ddx)  On arrival pt is nontoxic, vitals mildly hypertensive all other vitals unremarkable. Exam significant for reports of active nausea and no other active symptoms or neurodeficits.  Additional history obtained from chart review and seen by cardiology for paroxysmal SVT with palpitations.  Patient treated with diltiazem   I ordered medication aspirin , Benadryl , Solu-Medrol  for anticoagulation and prevention of allergic reaction to IV contrast.  Lab Tests:  I Ordered, reviewed, and interpreted labs, which included: CMP, CBC unremarkable, pending PT/INR APTT  Imaging Studies ordered:  I ordered imaging studies which included CT head without contrast, CT angio head and neck, I independently visualized and interpreted imaging which showed cerebellar infarcts  ED Course:   73 year old female presents ED with complaints of dizziness, nausea, diaphoresis at approximately 10 AM today.  Patient was walking through the house today 10 AM when she had dizziness to where she felt she  could not stand and started to have diffuse diaphoresis.  Patient went to lay down and had nausea as well.  Episode lasted approximately 15 minutes and she called her husband after the episode and husband reported that she had some slurred speech.  Symptoms eventually resolved but patient reports some lingering nausea.  Patient refused any antiemetics at time of initial evaluation.   On exam patient is sitting comfortably in bedside chair in no obvious distress and nontoxic-appearing.  Patient had normal neuroexam including normal coordination and gait.  Patient denies any dizziness, visual loss, visual disturbances, headache, neck pain, neck rigidity, hearing disturbances at this time.  No focal deficits or focal weaknesses.  No nystagmus noted on eye exam.  No pain with EOM, normal sclera, pupils equal and reactive bilaterally.  Lungs are clear to auscultation in all fields and no ischemic changes noted on EKG.  CT head noted cerebellar infarct.  Neurology was consulted.  Dr. Matthews with neurology recommended CTA head and neck, aspirin , admit to medicine and transferred to [facility] for MRI with and without contrast and further CVA workup.  She advised that they would follow-up at that time.  Patient has allergy to contrast and was started on allergic prophylactic for CTA head and neck.  Patient pending discharge.  At time of evaluation patient had no new symptoms and was sitting comfortably at bedside.  She was advised of treatment plan and agreed.  I spoke with hospitalist Dr. Drusilla for admission.  Dr. Drusilla agreed with admission and patient care was transferred.   Portions of this note were generated with Scientist, clinical (histocompatibility and immunogenetics). Dictation errors may occur despite best attempts at proofreading.   Final diagnoses:  Dizziness  Nausea  Cerebellar infarct [facility])    ED Discharge Orders     None          Myriam Fonda GORMAN DEVONNA 02/01/24 1904    Mannie Pac T, DO 02/05/24 2330

## 2024-02-01 NOTE — ED Notes (Signed)
 Called lab to run hold blood, spoke with [place].

## 2024-02-02 ENCOUNTER — Inpatient Hospital Stay (HOSPITAL_COMMUNITY)

## 2024-02-02 ENCOUNTER — Encounter (HOSPITAL_COMMUNITY): Payer: Self-pay | Admitting: Family Medicine

## 2024-02-02 ENCOUNTER — Other Ambulatory Visit (HOSPITAL_COMMUNITY)

## 2024-02-02 DIAGNOSIS — E7849 Other hyperlipidemia: Secondary | ICD-10-CM

## 2024-02-02 DIAGNOSIS — R9089 Other abnormal findings on diagnostic imaging of central nervous system: Secondary | ICD-10-CM | POA: Diagnosis not present

## 2024-02-02 DIAGNOSIS — I129 Hypertensive chronic kidney disease with stage 1 through stage 4 chronic kidney disease, or unspecified chronic kidney disease: Secondary | ICD-10-CM | POA: Diagnosis present

## 2024-02-02 DIAGNOSIS — Z96653 Presence of artificial knee joint, bilateral: Secondary | ICD-10-CM | POA: Diagnosis present

## 2024-02-02 DIAGNOSIS — Z9841 Cataract extraction status, right eye: Secondary | ICD-10-CM | POA: Diagnosis not present

## 2024-02-02 DIAGNOSIS — F419 Anxiety disorder, unspecified: Secondary | ICD-10-CM | POA: Diagnosis present

## 2024-02-02 DIAGNOSIS — Z833 Family history of diabetes mellitus: Secondary | ICD-10-CM | POA: Diagnosis not present

## 2024-02-02 DIAGNOSIS — Z853 Personal history of malignant neoplasm of breast: Secondary | ICD-10-CM | POA: Diagnosis not present

## 2024-02-02 DIAGNOSIS — E1151 Type 2 diabetes mellitus with diabetic peripheral angiopathy without gangrene: Secondary | ICD-10-CM | POA: Diagnosis not present

## 2024-02-02 DIAGNOSIS — I10 Essential (primary) hypertension: Secondary | ICD-10-CM

## 2024-02-02 DIAGNOSIS — E7800 Pure hypercholesterolemia, unspecified: Secondary | ICD-10-CM | POA: Diagnosis present

## 2024-02-02 DIAGNOSIS — F32A Depression, unspecified: Secondary | ICD-10-CM | POA: Diagnosis present

## 2024-02-02 DIAGNOSIS — I471 Supraventricular tachycardia, unspecified: Secondary | ICD-10-CM | POA: Diagnosis present

## 2024-02-02 DIAGNOSIS — I6381 Other cerebral infarction due to occlusion or stenosis of small artery: Secondary | ICD-10-CM | POA: Diagnosis not present

## 2024-02-02 DIAGNOSIS — Z7984 Long term (current) use of oral hypoglycemic drugs: Secondary | ICD-10-CM | POA: Diagnosis not present

## 2024-02-02 DIAGNOSIS — Z83438 Family history of other disorder of lipoprotein metabolism and other lipidemia: Secondary | ICD-10-CM | POA: Diagnosis not present

## 2024-02-02 DIAGNOSIS — Z8249 Family history of ischemic heart disease and other diseases of the circulatory system: Secondary | ICD-10-CM | POA: Diagnosis not present

## 2024-02-02 DIAGNOSIS — Z7902 Long term (current) use of antithrombotics/antiplatelets: Secondary | ICD-10-CM | POA: Diagnosis not present

## 2024-02-02 DIAGNOSIS — N1832 Chronic kidney disease, stage 3b: Secondary | ICD-10-CM | POA: Diagnosis present

## 2024-02-02 DIAGNOSIS — Z23 Encounter for immunization: Secondary | ICD-10-CM | POA: Diagnosis present

## 2024-02-02 DIAGNOSIS — R42 Dizziness and giddiness: Principal | ICD-10-CM

## 2024-02-02 DIAGNOSIS — R110 Nausea: Secondary | ICD-10-CM | POA: Diagnosis present

## 2024-02-02 DIAGNOSIS — E119 Type 2 diabetes mellitus without complications: Secondary | ICD-10-CM | POA: Diagnosis not present

## 2024-02-02 DIAGNOSIS — G459 Transient cerebral ischemic attack, unspecified: Secondary | ICD-10-CM | POA: Diagnosis present

## 2024-02-02 DIAGNOSIS — Z6834 Body mass index (BMI) 34.0-34.9, adult: Secondary | ICD-10-CM | POA: Diagnosis not present

## 2024-02-02 DIAGNOSIS — R29700 NIHSS score 0: Secondary | ICD-10-CM | POA: Diagnosis not present

## 2024-02-02 DIAGNOSIS — R569 Unspecified convulsions: Secondary | ICD-10-CM | POA: Diagnosis not present

## 2024-02-02 DIAGNOSIS — Y92009 Unspecified place in unspecified non-institutional (private) residence as the place of occurrence of the external cause: Secondary | ICD-10-CM | POA: Diagnosis not present

## 2024-02-02 DIAGNOSIS — E1122 Type 2 diabetes mellitus with diabetic chronic kidney disease: Secondary | ICD-10-CM | POA: Diagnosis present

## 2024-02-02 DIAGNOSIS — W19XXXA Unspecified fall, initial encounter: Secondary | ICD-10-CM | POA: Diagnosis present

## 2024-02-02 DIAGNOSIS — R1111 Vomiting without nausea: Secondary | ICD-10-CM | POA: Diagnosis not present

## 2024-02-02 DIAGNOSIS — Z7982 Long term (current) use of aspirin: Secondary | ICD-10-CM | POA: Diagnosis not present

## 2024-02-02 DIAGNOSIS — Z79899 Other long term (current) drug therapy: Secondary | ICD-10-CM | POA: Diagnosis not present

## 2024-02-02 DIAGNOSIS — Z85820 Personal history of malignant melanoma of skin: Secondary | ICD-10-CM | POA: Diagnosis not present

## 2024-02-02 DIAGNOSIS — Z743 Need for continuous supervision: Secondary | ICD-10-CM | POA: Diagnosis not present

## 2024-02-02 DIAGNOSIS — I6389 Other cerebral infarction: Secondary | ICD-10-CM | POA: Diagnosis not present

## 2024-02-02 DIAGNOSIS — Z91041 Radiographic dye allergy status: Secondary | ICD-10-CM | POA: Diagnosis not present

## 2024-02-02 DIAGNOSIS — I639 Cerebral infarction, unspecified: Secondary | ICD-10-CM | POA: Diagnosis present

## 2024-02-02 DIAGNOSIS — I6782 Cerebral ischemia: Secondary | ICD-10-CM | POA: Diagnosis not present

## 2024-02-02 DIAGNOSIS — K219 Gastro-esophageal reflux disease without esophagitis: Secondary | ICD-10-CM | POA: Diagnosis present

## 2024-02-02 DIAGNOSIS — Z9842 Cataract extraction status, left eye: Secondary | ICD-10-CM | POA: Diagnosis not present

## 2024-02-02 LAB — GLUCOSE, CAPILLARY
Glucose-Capillary: 212 mg/dL — ABNORMAL HIGH (ref 70–99)
Glucose-Capillary: 102 mg/dL — ABNORMAL HIGH (ref 70–99)
Glucose-Capillary: 85 mg/dL (ref 70–99)
Glucose-Capillary: 92 mg/dL (ref 70–99)
Glucose-Capillary: 105 mg/dL — ABNORMAL HIGH (ref 70–99)

## 2024-02-02 LAB — COMPREHENSIVE METABOLIC PANEL WITH GFR
ALT: 17 U/L (ref 0–44)
AST: 20 U/L (ref 15–41)
Albumin: 3.8 g/dL (ref 3.5–5.0)
Alkaline Phosphatase: 31 U/L — ABNORMAL LOW (ref 38–126)
Anion gap: 11 (ref 5–15)
BUN: 20 mg/dL (ref 8–23)
CO2: 22 mmol/L (ref 22–32)
Calcium: 9.5 mg/dL (ref 8.9–10.3)
Chloride: 104 mmol/L (ref 98–111)
Creatinine, Ser: 1.28 mg/dL — ABNORMAL HIGH (ref 0.44–1.00)
GFR, Estimated: 44 mL/min — ABNORMAL LOW (ref >60)
Glucose, Bld: 120 mg/dL — ABNORMAL HIGH (ref 70–99)
Potassium: 4.2 mmol/L (ref 3.5–5.1)
Sodium: 137 mmol/L (ref 135–145)
Total Bilirubin: 0.7 mg/dL (ref 0.0–1.2)
Total Protein: 6.9 g/dL (ref 6.5–8.1)

## 2024-02-02 LAB — CBC
HCT: 39.7 % (ref 36.0–46.0)
Hemoglobin: 13 g/dL (ref 12.0–15.0)
MCH: 28.4 pg (ref 26.0–34.0)
MCHC: 32.7 g/dL (ref 30.0–36.0)
MCV: 86.9 fL (ref 80.0–100.0)
Platelets: 298 K/uL (ref 150–400)
RBC: 4.57 MIL/uL (ref 3.87–5.11)
RDW: 12.3 % (ref 11.5–15.5)
WBC: 7.4 K/uL (ref 4.0–10.5)
nRBC: 0 % (ref 0.0–0.2)

## 2024-02-02 LAB — LIPID PANEL
Cholesterol: 105 mg/dL (ref 0–200)
HDL: 43 mg/dL (ref >40)
LDL Cholesterol: 51 mg/dL (ref 0–99)
Total CHOL/HDL Ratio: 2.4 ratio
Triglycerides: 56 mg/dL (ref <150)
VLDL: 11 mg/dL (ref 0–40)

## 2024-02-02 LAB — HEMOGLOBIN A1C
Hgb A1c MFr Bld: 6.1 % — ABNORMAL HIGH (ref 4.8–5.6)
Mean Plasma Glucose: 128.37 mg/dL

## 2024-02-02 MED ORDER — ONDANSETRON HCL 4 MG/2ML IJ SOLN: 4.0000 mg | Freq: Four times a day (QID) | INTRAMUSCULAR | Status: DC | PRN

## 2024-02-02 MED ORDER — STROKE: EARLY STAGES OF RECOVERY BOOK
Freq: Once | Status: AC
  Filled 2024-02-02: qty 1

## 2024-02-02 MED ORDER — SODIUM CHLORIDE 0.9 % IV SOLN: INTRAVENOUS | Status: DC

## 2024-02-02 MED ORDER — CLOPIDOGREL BISULFATE 75 MG PO TABS
75.0000 mg | ORAL_TABLET | Freq: Every day | ORAL | Status: DC
  Administered 2024-02-02: 75 mg via ORAL
  Filled 2024-02-02: qty 1

## 2024-02-02 MED ORDER — ASPIRIN 81 MG PO TBEC
81.0000 mg | DELAYED_RELEASE_TABLET | Freq: Every day | ORAL | Status: DC
  Administered 2024-02-02 – 2024-02-03 (×2): 81 mg via ORAL
  Filled 2024-02-02 (×2): qty 1

## 2024-02-02 MED ORDER — GLIPIZIDE 5 MG PO TABS
5.0000 mg | ORAL_TABLET | Freq: Two times a day (BID) | ORAL | Status: DC
  Administered 2024-02-02 – 2024-02-03 (×3): 5 mg via ORAL
  Filled 2024-02-02 (×4): qty 1

## 2024-02-02 MED ORDER — INFLUENZA VAC SPLIT HIGH-DOSE 0.5 ML IM SUSY
0.5000 mL | PREFILLED_SYRINGE | INTRAMUSCULAR | Status: AC
  Administered 2024-02-02: 0.5 mL via INTRAMUSCULAR
  Filled 2024-02-02: qty 0.5

## 2024-02-02 MED ORDER — ENOXAPARIN SODIUM 40 MG/0.4ML IJ SOSY: 40.0000 mg | PREFILLED_SYRINGE | INTRAMUSCULAR | Status: DC

## 2024-02-02 MED ORDER — METHYLPREDNISOLONE SODIUM SUCC 40 MG IJ SOLR: 40.0000 mg | Freq: Once | INTRAMUSCULAR | Status: DC

## 2024-02-02 MED ORDER — DIPHENHYDRAMINE HCL 25 MG PO CAPS: 50.0000 mg | ORAL_CAPSULE | Freq: Once | ORAL | Status: DC

## 2024-02-02 MED ORDER — SODIUM CHLORIDE 0.9 % IV SOLN: 250.0000 mL | INTRAVENOUS | Status: AC | PRN

## 2024-02-02 MED ORDER — SODIUM CHLORIDE 0.9% FLUSH: 3.0000 mL | INTRAVENOUS | Status: DC | PRN

## 2024-02-02 MED ORDER — DIPHENHYDRAMINE HCL 50 MG/ML IJ SOLN: 50.0000 mg | Freq: Once | INTRAMUSCULAR | Status: DC

## 2024-02-02 MED ORDER — SODIUM CHLORIDE 0.9% FLUSH
3.0000 mL | Freq: Two times a day (BID) | INTRAVENOUS | Status: DC
  Administered 2024-02-02 – 2024-02-03 (×3): 3 mL via INTRAVENOUS

## 2024-02-02 MED ORDER — LACTATED RINGERS IV SOLN: INTRAVENOUS | Status: AC

## 2024-02-02 MED ORDER — ENOXAPARIN SODIUM 40 MG/0.4ML IJ SOSY
40.0000 mg | PREFILLED_SYRINGE | INTRAMUSCULAR | Status: DC
Start: 2024-02-02 — End: 2024-02-03
  Administered 2024-02-02 – 2024-02-03 (×2): 40 mg via SUBCUTANEOUS
  Filled 2024-02-02 (×2): qty 0.4

## 2024-02-02 MED ORDER — SODIUM CHLORIDE 0.9% FLUSH: 3.0000 mL | Freq: Two times a day (BID) | INTRAVENOUS | Status: DC

## 2024-02-02 MED ORDER — FENOFIBRATE 160 MG PO TABS
160.0000 mg | ORAL_TABLET | Freq: Every day | ORAL | Status: DC
  Administered 2024-02-02: 160 mg via ORAL
  Filled 2024-02-02: qty 1

## 2024-02-02 MED ORDER — GADOBUTROL 1 MMOL/ML IV SOLN
10.0000 mL | Freq: Once | INTRAVENOUS | Status: AC | PRN
Start: 2024-02-02 — End: 2024-02-02
  Administered 2024-02-02: 10 mL via INTRAVENOUS

## 2024-02-02 MED ORDER — CLOPIDOGREL BISULFATE 75 MG PO TABS
75.0000 mg | ORAL_TABLET | Freq: Every day | ORAL | Status: DC
  Administered 2024-02-03: 75 mg via ORAL
  Filled 2024-02-02: qty 1

## 2024-02-02 MED ORDER — ONDANSETRON HCL 4 MG PO TABS: 4.0000 mg | ORAL_TABLET | Freq: Four times a day (QID) | ORAL | Status: DC | PRN

## 2024-02-02 MED ORDER — ROSUVASTATIN CALCIUM 5 MG PO TABS
5.0000 mg | ORAL_TABLET | ORAL | Status: DC
  Administered 2024-02-02: 5 mg via ORAL
  Filled 2024-02-02: qty 1

## 2024-02-02 MED ORDER — PANTOPRAZOLE SODIUM 40 MG PO TBEC
40.0000 mg | DELAYED_RELEASE_TABLET | Freq: Every day | ORAL | Status: DC
  Administered 2024-02-02 – 2024-02-03 (×2): 40 mg via ORAL
  Filled 2024-02-02 (×2): qty 1

## 2024-02-02 MED ORDER — DILTIAZEM HCL 30 MG PO TABS: 30.0000 mg | ORAL_TABLET | Freq: Three times a day (TID) | ORAL | Status: DC | PRN

## 2024-02-02 MED ORDER — INSULIN ASPART 100 UNIT/ML IJ SOLN: 0.0000 [IU] | Freq: Three times a day (TID) | INTRAMUSCULAR | Status: DC | PRN

## 2024-02-02 NOTE — TOC Transition Note (Signed)
 Transition of Care [facility]) - Discharge Note   Patient Details  Name: Jill Garza MRN: [id number] Date of Birth: [birth year]  [facility] CM/SW Contact:  Andrez JULIANNA George, RN Phone Number: 02/02/2024, 1:33 PM   Clinical Narrative:    Episode of dizziness with sudden onset of nausea and diaphoresis.  Pt is from home with her spouse. They are together most of the time.  Both drive.  She manages her own medications and denies issues.   No follow up per therapies. Pt discharging home with self care.  Pt has transportation home.   Final next level of care: Home/Self Care Barriers to Discharge: No Barriers Identified   Patient Goals and CMS Choice            Discharge Placement                       Discharge Plan and Services Additional resources added to the After Visit Summary for                                       Social Drivers of Health (SDOH) Interventions SDOH Screenings   Food Insecurity: No Food Insecurity (02/02/2024)  Housing: Low Risk  (02/02/2024)  Transportation Needs: No Transportation Needs (02/02/2024)  Utilities: Not At Risk (02/02/2024)  Alcohol Screen: Low Risk  (12/14/2023)  Depression (PHQ2-9): Low Risk  (12/15/2023)  Financial Resource Strain: Low Risk  (12/14/2023)  Physical Activity: Insufficiently Active (12/14/2023)  Social Connections: Moderately Isolated (02/02/2024)  Stress: Stress Concern Present (12/14/2023)  Tobacco Use: Medium Risk (02/02/2024)     Readmission Risk Interventions     No data to display

## 2024-02-02 NOTE — H&P (Signed)
 History and Physical    Jill Garza [facility] FMW:[id number] DOB: January 22, 1951 DOA: 02/01/2024  PCP: [name], NP   Patient coming from: Home   Chief Complaint:  Chief Complaint  Patient presents with   Dizziness   ED TRIAGE note:Episode of dizziness with sudden onset of nausea and diaphoresis at 1000 this am and a similar episode last week. All symptoms except mild nausea have resolved. Both episodes resolved spontaneously but the recovery today took longer. Has been experiencing intermittent over the last couple of years. Had previous CT head that was negative.   HPI:  Jill Garza is a 73 y.o. female with medical history significant of DM type II, CKD 3B,, GERD, depression, anxiety, hiatal hernia, hyperparathyroidism, essential hypertension history of breast cancer status post lumpectomy with radioactive seed localization 2016, history of hysterectomy, history of melanoma excision, cholecystectomy and cataract extraction of the bilateral eyes presented to emergency department with complaining of diaphoresis, dizziness around 10 AM.  Family reported that they have to hold her still patient had a fall and hit her head.  Patient denies any injury or wound to the head at the time.  Patient reported the incidence today was similar she was walking to the house and suddenly became very dizzy diaphoretic and laid down for 15-minute which improved her symptoms.  Patient called her husband and he advised that patient sounded like having slurred speech over phone.  Patient also reported she has some nausea. Patient denies any headache, tingling, tremor, sensory change, speech change, focal weakness, generalized weakness seizure or loss of consciousness.  Of note, per cardiology note 01/05/2024 patient has recent diagnosis of paroxysmal supraventricular tachycardia.  Patient reported episodes of rapid heartbeat, workup showing PSVT with palpitation with occasional sharp chest pain.  No episodes  of atrial fibrillation.  Symptoms occurred at rest with no activity.  Vagal maneuver effective.  Patient has been started on Cardizem  3 mg as needed for episodes not resolving with vagal maneuver.  At presentation to ED patient is hemodynamically stable. Lab work, CMP showing low bicarb 21, creatinine 1.35 and GFR 41 renal function baseline.  Elevated calcium  10.5 otherwise unremarkable. CBC unremarkable. EKG showing exhalatory junctional rhythm, right bundle branch block heart rate 79  CT head no acute intracranial abnormality.  Small remote appearing cerebral infraction and chronic microvascular ischemic disease.  CT angio head and neck no large vessel occlusion.  EDP reached out to Dr. Matthews in the daytime recommended recommended CTA head and neck, aspirin , admit to medicine and transferred to [facility] for MRI with and without contrast and CVA workup.  In the ED patient treated with aspirin  325 mg.   Before the CTA head and neck patient has been treated with Benadryl  and Solu-Medrol  given history of contrast allergy.  Patient has been [facility] for further evaluation management of dizziness-rule out CVA.  Significant labs in the ED: Lab Orders         CBC         Differential         Comprehensive metabolic panel         Glucose, capillary         Comprehensive metabolic panel         CBC         Lipid panel         Hemoglobin A1c       Review of Systems:  Review of Systems  Eyes:  Negative for  blurred vision.  Cardiovascular:  Negative for chest pain.  Neurological:  Positive for dizziness. Negative for tingling, tremors, sensory change, speech change, focal weakness, seizures, loss of consciousness, weakness and headaches.  Psychiatric/Behavioral:  The patient is not nervous/anxious.     Past Medical History:  Diagnosis Date   Arthritis    Cancer (HCC)    melanoma- right leg   Chronic kidney disease    Depression    GERD  (gastroesophageal reflux disease)    pt denies    History of hiatal hernia    Hypercholesterolemia    Hyperparathyroidism    Hypertension    Sweat, sweating, excessive    Type 2 diabetes mellitus (HCC)     Past Surgical History:  Procedure Laterality Date   ABDOMINAL HYSTERECTOMY  1979   APPENDECTOMY  1964   BREAST EXCISIONAL BIOPSY Left 08/2014   benign   BREAST LUMPECTOMY WITH RADIOACTIVE SEED LOCALIZATION Left 12/31/2014   Procedure: LEFT BREAST LUMPECTOMY WITH RADIOACTIVE SEED LOCALIZATION;  Surgeon: Donnice Lima, MD;  Location: [facility];  Service: General;  Laterality: Left;   CARPAL TUNNEL RELEASE Right 1977   CARPAL TUNNEL RELEASE Left 2007   CATARACT EXTRACTION Left 04/13/2014   Dr. Cleatus   CATARACT EXTRACTION Right 06/10/2014   Dr. Cleatus   CHOLECYSTECTOMY N/A 07/14/2017   Procedure: LAPAROSCOPIC CHOLECYSTECTOMY;  Surgeon: Teresa Lonni HERO, MD;  Location: [facility] OR;  Service: General;  Laterality: N/A;   COLONOSCOPY  2009   JOINT REPLACEMENT     MELANOMA EXCISION     PARATHYROIDECTOMY N/A 09/10/2020   Procedure: PARATHYROIDECTOMY;  Surgeon: Eletha Boas, MD;  Location: [facility];  Service: General;  Laterality: N/A;   THYROID  EXPLORATION N/A 09/10/2020   Procedure: POSSIBLE NECK EXPLORATION;  Surgeon: Eletha Boas, MD;  Location: [facility];  Service: General;  Laterality: N/A;   TONSILLECTOMY  1962   TOTAL KNEE ARTHROPLASTY Left 2012   TOTAL KNEE ARTHROPLASTY Right 2008     reports that she quit smoking about 37 years ago. Her smoking use included cigarettes. She started smoking about 52 years ago. She has a 15 pack-year smoking history. She has never used smokeless tobacco. She reports current alcohol use. She reports that she does not use drugs.  Allergies  Allergen Reactions   Penicillins     Rash    Ivp Dye [Iodinated Contrast Media]     Hives    Percocet [Oxycodone -Acetaminophen ]     Itching     Family History  Problem Relation Age of  Onset   Pulmonary embolism Mother        Multiple, B/L    Heart attack Father        Died of MI age 55   Hypertension Sister    Diabetes type II Sister    High Cholesterol Sister    Deep vein thrombosis Sister    Hypertension Brother    Heart disease Brother 86   Diabetes Brother    High Cholesterol Brother    Hypertension Brother    High Cholesterol Brother    Alcohol abuse Brother    Deep vein thrombosis Brother    Atrial fibrillation Brother    Hepatitis Brother    Cancer Maternal Grandmother 47   Reye's syndrome Daughter    Stroke Other    Colon cancer Neg Hx    Esophageal cancer Neg Hx    Stomach cancer Neg Hx    Rectal cancer Neg Hx     Prior to Admission  medications   Medication Sig Start Date End Date Taking? Authorizing Provider  acetaminophen  (TYLENOL ) 500 MG tablet Take 500 mg by mouth as needed (For headache).   Yes [provider]  amLODipine  (NORVASC ) 5 MG tablet Take 1 tablet by mouth once daily 01/12/24  Yes Eubanks, Jessica K, NP  benzonatate  (TESSALON ) 200 MG capsule Take 1 capsule (200 mg total) by mouth 3 (three) times daily as needed for cough. 10/18/23  Yes Darlean Ozell NOVAK, MD  fenofibrate  (TRICOR ) 145 MG tablet TAKE ONE TABLET BY MOUTH ONCE A DAY 11/14/23  Yes Eubanks, Jessica K, NP  glipiZIDE  (GLUCOTROL ) 5 MG tablet TAKE 1 TABLET BY MOUTH TWICE DAILY BEFORE MEAL(S) 09/18/23  Yes Eubanks, Jessica K, NP  losartan  (COZAAR ) 100 MG tablet Take 1 tablet by mouth once daily 09/18/23  Yes Eubanks, Jessica K, NP  omeprazole  (PRILOSEC) 20 MG capsule Take 20 mg by mouth daily.   Yes [provider]  rosuvastatin  (CRESTOR ) 5 MG tablet Take 1 tablet (5 mg total) by mouth 3 (three) times a week. 01/03/24  Yes Eubanks, Jessica K, NP  triamcinolone cream (KENALOG) 0.1 % Apply 1 Application topically as directed. 01/05/24  Yes [provider]  COVID-19 mRNA vaccine, Moderna, >/= 40yrs, (SPIKEVAX ) injection Inject 0.5 mLs into the muscle as directed.  01/19/24   [name], NP  diltiazem  (CARDIZEM ) 30 MG tablet Take 1 tablet (30 mg total) by mouth 3 (three) times daily as needed. 01/05/24   Patwardhan, Newman PARAS, MD  famotidine  (PEPCID ) 20 MG tablet One after supper Patient not taking: Reported on 02/01/2024 10/18/23   Darlean Ozell NOVAK, MD  pantoprazole  (PROTONIX ) 40 MG tablet Take 1 tablet (40 mg total) by mouth daily. Take 30-60 min before first meal of the day Patient not taking: Reported on 02/01/2024 10/18/23   Darlean Ozell NOVAK, MD     Physical Exam: Vitals:   02/01/24 2042 02/01/24 2100 02/02/24 0000 02/02/24 0149  BP:  138/72 136/83 (!) 127/90  Pulse:  92 96 96  Resp:  17 (!) 26 18  Temp: (!) 97.1 F (36.2 C)   98.1 F (36.7 C)  TempSrc: Oral   Oral  SpO2:  96% 94% 96%  Weight:      Height:    5' 4 (1.626 m)    Physical Exam Vitals and nursing note reviewed.  Constitutional:      General: She is not in acute distress.    Appearance: She is not ill-appearing.  HENT:     Mouth/Throat:     Mouth: Mucous membranes are moist.  Eyes:     Pupils: Pupils are equal, round, and reactive to light.  Cardiovascular:     Rate and Rhythm: Normal rate and regular rhythm.     Pulses: Normal pulses.     Heart sounds: Normal heart sounds.  Pulmonary:     Effort: Pulmonary effort is normal.     Breath sounds: Normal breath sounds.  Abdominal:     General: Bowel sounds are normal.  Musculoskeletal:     Cervical back: Neck supple.  Skin:    General: Skin is warm.     Capillary Refill: Capillary refill takes less than 2 seconds.  Neurological:     Mental Status: She is alert and oriented to person, place, and time.     Cranial Nerves: No cranial nerve deficit.     Sensory: No sensory deficit.     Motor: No weakness.  Psychiatric:  Mood and Affect: Mood normal.      Labs on Admission: I have personally reviewed following labs and imaging studies  CBC: Recent Labs  Lab 02/01/24 1329  WBC 6.7  NEUTROABS 4.3   HGB 13.4  HCT 41.2  MCV 87.1  PLT 300   Basic Metabolic Panel: Recent Labs  Lab 02/01/24 1329  NA 139  K 4.3  CL 104  CO2 21*  GLUCOSE 153*  BUN 23  CREATININE 1.35*  CALCIUM  10.5*   GFR: Estimated Creatinine Clearance: 39.3 mL/min (A) (by C-G formula based on SCr of 1.35 mg/dL (H)). Liver Function Tests: Recent Labs  Lab 02/01/24 1329  AST 23  ALT 15  ALKPHOS 43  BILITOT 0.5  PROT 7.6  ALBUMIN 4.4   No results for input(s): LIPASE, AMYLASE in the last 168 hours. No results for input(s): AMMONIA in the last 168 hours. Coagulation Profile: No results for input(s): INR, PROTIME in the last 168 hours. Cardiac Enzymes: No results for input(s): CKTOTAL, CKMB, CKMBINDEX, TROPONINI, TROPONINIHS in the last 168 hours. BNP (last 3 results) Recent Labs    10/18/23 1049  BNP 97   HbA1C: No results for input(s): HGBA1C in the last 72 hours. CBG: Recent Labs  Lab 02/02/24 0149  GLUCAP 212*   Lipid Profile: No results for input(s): CHOL, HDL, LDLCALC, TRIG, CHOLHDL, LDLDIRECT in the last 72 hours. Thyroid  Function Tests: No results for input(s): TSH, T4TOTAL, FREET4, T3FREE, THYROIDAB in the last 72 hours. Anemia Panel: No results for input(s): VITAMINB12, FOLATE, FERRITIN, TIBC, IRON, RETICCTPCT in the last 72 hours. Urine analysis:    Component Value Date/Time   COLORURINE YELLOW 07/14/2017 0733   APPEARANCEUR CLEAR 07/14/2017 0733   LABSPEC >1.030 (H) 07/14/2017 0733   PHURINE 5.5 07/14/2017 0733   GLUCOSEU NEGATIVE 07/14/2017 0733   HGBUR NEGATIVE 07/14/2017 0733   BILIRUBINUR small 04/18/2018 1006   KETONESUR NEGATIVE 07/14/2017 0733   PROTEINUR Positive (A) 04/18/2018 1006   PROTEINUR NEGATIVE 07/14/2017 0733   UROBILINOGEN 0.2 04/18/2018 1006   NITRITE negative 04/18/2018 1006   NITRITE NEGATIVE 07/14/2017 0733   LEUKOCYTESUR Large (3+) (A) 04/18/2018 1006    Radiological Exams on Admission:  I have personally reviewed images CT ANGIO HEAD NECK W WO CM Result Date: 02/01/2024 CLINICAL DATA:  Initial evaluation for acute stroke/TIA. EXAM: CT ANGIOGRAPHY HEAD AND NECK WITH AND WITHOUT CONTRAST TECHNIQUE: Multidetector CT imaging of the head and neck was performed using the standard protocol during bolus administration of intravenous contrast. Multiplanar CT image reconstructions and MIPs were obtained to evaluate the vascular anatomy. Carotid stenosis measurements (when applicable) are obtained utilizing NASCET criteria, using the distal internal carotid diameter as the denominator. RADIATION DOSE REDUCTION: This exam was performed according to the departmental dose-optimization program which includes automated exposure control, adjustment of the mA and/or kV according to patient size and/or use of iterative reconstruction technique. CONTRAST:  60mL OMNIPAQUE  IOHEXOL  350 MG/ML SOLN COMPARISON:  CT from earlier the same day. FINDINGS: CTA NECK FINDINGS Aortic arch: Visualized arch within normal limits for caliber. Mild aortic atherosclerosis. Bovine branching pattern. No stenosis about the origin the great vessels. Right carotid system: Right common and internal carotid arteries are tortuous but patent without stenosis or dissection. Left carotid system: Left common and internal carotid arteries are tortuous but patent without stenosis or dissection. Vertebral arteries: Left vertebral artery dominant. No stenosis or dissection. Skeleton: No worrisome osseous lesions. Other neck: No other acute finding. Upper chest: No other acute finding.  Review of the MIP images confirms the above findings CTA HEAD FINDINGS Anterior circulation: Both internal carotid arteries widely patent to the termini without stenosis. A1 segments widely patent. Normal anterior communicating artery complex. Both anterior cerebral arteries widely patent to their distal aspects without stenosis. No M1 stenosis or occlusion. Normal MCA  bifurcations. Distal MCA branches well perfused and symmetric. Posterior circulation: Both V4 segments patent without stenosis. Left vertebral artery dominant. Left PICA patent. Right PICA not seen. Basilar patent without stenosis. Superior cerebellar and posterior cerebral arteries patent bilaterally. Venous sinuses: Patent allowing for timing the contrast bolus. Anatomic variants: As above.  No aneurysm. Review of the MIP images confirms the above findings IMPRESSION: 1. Negative CTA of the head and neck. No large vessel occlusion or other emergent finding. No hemodynamically significant or correctable stenosis. 2. Diffuse tortuosity of the major arterial vasculature of the head and neck, suggesting chronic underlying hypertension. Aortic Atherosclerosis (ICD10-I70.0). Electronically Signed   By: Morene Hoard M.D.   On: 02/01/2024 21:47   CT Head Wo Contrast Result Date: 02/01/2024 CLINICAL DATA:  Neuro deficit, acute, stroke suspected dizziness EXAM: CT HEAD WITHOUT CONTRAST TECHNIQUE: Contiguous axial images were obtained from the base of the skull through the vertex without intravenous contrast. RADIATION DOSE REDUCTION: This exam was performed according to the departmental dose-optimization program which includes automated exposure control, adjustment of the mA and/or kV according to patient size and/or use of iterative reconstruction technique. COMPARISON:  MRI head October 17, 2020. FINDINGS: Brain: No evidence of acute infarction, hemorrhage, hydrocephalus, extra-axial collection or mass lesion/mass effect. Patchy white matter hypodensities are nonspecific but compatible with chronic microvascular ischemic change. Small remote appearing cerebellar infarcts. Vascular: No hyperdense vessel identified. Skull: No acute fracture. Sinuses/Orbits: Clear sinuses.  No acute orbital findings. IMPRESSION: 1. No evidence of acute intracranial abnormality. 2. Small remote appearing cerebellar infarcts and  chronic microvascular ischemic disease. Electronically Signed   By: Gilmore GORMAN Molt M.D.   On: 02/01/2024 14:09     EKG: My personal interpretation of EKG shows: Accelerated junctional rhythm heart rate 79    Assessment/Plan: Principal Problem:   Dizziness Active Problems:   Non-insulin  dependent type 2 diabetes mellitus (HCC)   Anxiety with depression   Essential hypertension   Hyperlipidemia   Hyperparathyroidism   CKD stage 3b, GFR 30-44 ml/min (HCC)   PSVT (paroxysmal supraventricular tachycardia)    Assessment and Plan: Dizziness/vertigo/Pre-syncope -Presented to emergency department complaining of dizziness/vertigo which is causing mechanical fall without loss of consciousness.  Patient denies any seizure.  Have similar episodes previously.  Patient reported feeling warmth, nausea, diaphoresis and dizziness that cause fall. -At presentation to ED patient is hemodynamically stable. Lab work, CMP showing low bicarb 21, creatinine 1.35 and GFR 41 renal function baseline.  Elevated calcium  10.5 otherwise unremarkable. CBC unremarkable. EKG showing exhalatory junctional rhythm, right bundle branch block heart rate 79. -CT head no acute intracranial abnormality.  Small remote appearing cerebral infraction and chronic microvascular ischemic disease. - CT angio head and neck no large vessel occlusion. -EDP reached out to Dr. Matthews in the daytime recommended recommended CTA head and neck, aspirin , admit to medicine and transferred to [facility] for MRI with and without contrast and CVA workup. -In the ED patient treated with aspirin  325 mg.   Before the CTA head and neck patient has been treated with Benadryl  and Solu-Medrol  given history of contrast allergy. -Etiology of dizziness/vertigo/presyncope unknown ruling out CVA.  Also continue to monitor cardiac monitor to  rule out any arrhythmia provoking presyncope. - Obtaining MRI brain with or without contrast (pretreating with Solu-Medrol   and Benadryl )  Continue neurocheck every 4 hours.  Check A1c lipid panel.  Obtaining echocardiogram. Continue aspirin  81 mg daily.  Continue Crestor  and fenofibrate .  Continue permissive hypertension until rule out acute CVA. -Continue cardiac monitoring   Essential hypertension -Holding home blood pressure regimen until goals of acute CVA.  Hyperlipidemia -Continue Crestor  and fenofibrate   History of PSVT Continue Cardizem  30 mg every 8 hour as needed for episodes of PSVT.  Non-insulin -dependent DM type II -Continue glipizide  and sliding scale insulin  as needed with mealtime  Anxiety and depression -At home patient is not any antianxiety medication.  GERD - Continue Protonix   CKD 3B -Creatinine 1.35 and GFR 41.  Renal function at baseline.  Continue to monitor.  Given patient has CT angio head and neck with contrast exposure and now going to have MRI with and without contrast starting LR 75 cc/h to prevent AKI.   DVT prophylaxis:  Lovenox  Code Status:  Full Code Diet: Heart healthy carb modified diet Family Communication:   Family was present at bedside, at the time of interview. Opportunity was given to ask question and all questions were answered satisfactorily.  Disposition Plan: Pending MRI brain and echocardiogram Consults: Neurology Admission status:   Inpatient, Telemetry bed  Severity of Illness: The appropriate patient status for this patient is INPATIENT. Inpatient status is judged to be reasonable and necessary in order to provide the required intensity of service to ensure the patient's safety. The patient's presenting symptoms, physical exam findings, and initial radiographic and laboratory data in the context of their chronic comorbidities is felt to place them at high risk for further clinical deterioration. Furthermore, it is not anticipated that the patient will be medically stable for discharge from the hospital within 2 midnights of admission.   * I certify  that at the point of admission it is my clinical judgment that the patient will require inpatient hospital care spanning beyond 2 midnights from the point of admission due to high intensity of service, high risk for further deterioration and high frequency of surveillance required.DEWAINE    Ludwig Tugwell, MD [facility]  How to contact the [facility] Attending or Consulting provider 7A - 7P or covering provider during after hours 7P -7A, for this patient.  Check the care team in [facility] and look for a) attending/consulting [facility] provider listed and b) the [facility] team listed Log into [email or web address] and use [facility]'s universal password to access. If you do not have the password, please contact the hospital operator. Locate the [facility] provider you are looking for under [facility] and page to a number that you can be directly reached. If you still have difficulty reaching the provider, please page the [facility] (Director on Call) for the Hospitalists listed on amion for assistance.  02/02/2024, 3:32 AM

## 2024-02-02 NOTE — Evaluation (Signed)
 Physical Therapy Evaluation Patient Details Name: Jill Garza MRN: [id number] DOB: 03-27-1951 Today's Date: 02/02/2024  History of Present Illness  73 y.o. female presents to [facility] 02/01/24 after falling and hitting her head 2/2 episode of dizziness. Episode concerning for TIA, CT showed remote appearing cerebellar infarcts. PMHx:  HTN, DM2, GERD, HLD, CKD 3B, breast cancer s/p lumpectomy with radioactive seed localization 2016, history of hysterectomy, history of melanoma excision, cholecystectomy and cataract extraction of the bilateral eyes   Clinical Impression  PTA pt was independent for mobility with no AD. Pt has had two recent falls that pt attributes to feeling dizzy. Pt presents at mobility baseline with ability to ambulate 435ft independently with no AD and negotiate two steps with no handrails. Pt scored a 23/24 on the DGI indicating that pt is not at an increased risk of falling. Pt has assist available upon d/c home and feels comfortable leaving whenever medically stable. No further acute or post-acute PT needs with acute PT signing off. Please re-consult if new needs arise.       If plan is discharge home, recommend the following: Assist for transportation   Can travel by private vehicle   Yes     Equipment Recommendations None recommended by PT     Functional Status Assessment Patient has not had a recent decline in their functional status     Precautions / Restrictions Precautions Precautions: Fall Recall of Precautions/Restrictions: Intact Restrictions Weight Bearing Restrictions Per Provider Order: No      Mobility  Bed Mobility  General bed mobility comments: in recliner    Transfers Overall transfer level: Independent Equipment used: None     Ambulation/Gait Ambulation/Gait assistance: Independent Gait Distance (Feet): 400 Feet Assistive device: None Gait Pattern/deviations: WFL(Within Functional Limits)   Gait velocity interpretation: >2.62  ft/sec, indicative of community ambulatory     Stairs Stairs: Yes Stairs assistance: Independent Stair Management: No rails, Forwards, Alternating pattern Number of Stairs: 2 General stair comments: alternating pattern with no UE support    Modified Rankin (Stroke Patients Only) Modified Rankin (Stroke Patients Only) Pre-Morbid Rankin Score: No symptoms Modified Rankin: No symptoms     Balance Overall balance assessment: Independent   Standardized Balance Assessment Standardized Balance Assessment : Dynamic Gait Index   Dynamic Gait Index Level Surface: Normal Change in Gait Speed: Normal Gait with Horizontal Head Turns: Normal Gait with Vertical Head Turns: Mild Impairment Gait and Pivot Turn: Normal Step Over Obstacle: Normal Step Around Obstacles: Normal Steps: Normal Total Score: 23       Pertinent Vitals/Pain Pain Assessment Pain Assessment: No/denies pain    Home Living Family/patient expects to be discharged to:: Private residence Living Arrangements: Spouse/significant other Available Help at Discharge: Family Type of Home: House Home Access: Stairs to enter Entrance Stairs-Rails: Right Entrance Stairs-Number of Steps: 1-2 STE Alternate Level Stairs-Number of Steps: FF (does not need to access 2nd level) Home Layout: Two level;Full bath on main level;Able to live on main level with bedroom/bathroom Home Equipment: Rolling Walker (2 wheels);Cane - single point;Tub bench;Grab bars - tub/shower;Grab bars - toilet      Prior Function Prior Level of Function : Independent/Modified Independent      Mobility Comments: No DME PTA ADLs Comments: indep with ADLs, driving, and household maintenance     Extremity/Trunk Assessment   Upper Extremity Assessment Upper Extremity Assessment: Defer to OT evaluation    Lower Extremity Assessment Lower Extremity Assessment: Overall WFL for tasks assessed    Cervical /  Trunk Assessment Cervical / Trunk  Assessment: Normal  Communication   Communication Communication: No apparent difficulties    Cognition Arousal: Alert Behavior During Therapy: WFL for tasks assessed/performed   PT - Cognitive impairments: No apparent impairments    Following commands: Intact       Cueing Cueing Techniques: Verbal cues     General Comments General comments (skin integrity, edema, etc.): VSS on RA     PT Assessment Patient does not need any further PT services         PT Goals (Current goals can be found in the Care Plan section)  Acute Rehab PT Goals PT Goal Formulation: All assessment and education complete, DC therapy     AM-PAC PT 6 Clicks Mobility  Outcome Measure Help needed turning from your back to your side while in a flat bed without using bedrails?: None Help needed moving from lying on your back to sitting on the side of a flat bed without using bedrails?: None Help needed moving to and from a bed to a chair (including a wheelchair)?: None Help needed standing up from a chair using your arms (e.g., wheelchair or bedside chair)?: None Help needed to walk in hospital room?: None Help needed climbing 3-5 steps with a railing? : None 6 Click Score: 24    End of Session Equipment Utilized During Treatment: Gait belt Activity Tolerance: Patient tolerated treatment well Patient left: in chair;with call bell/phone within reach Nurse Communication: Mobility status PT Visit Diagnosis: Other abnormalities of gait and mobility (R26.89)    Time: 8890-8877 PT Time Calculation (min) (ACUTE ONLY): 13 min   Charges:   PT Evaluation $PT Eval Low Complexity: 1 Low   PT General Charges $$ ACUTE PT VISIT: 1 Visit        Kate ORN, PT, DPT Secure Chat Preferred  Rehab Office [phone]   Kate BRAVO Jill Garza 02/02/2024, 12:25 PM

## 2024-02-02 NOTE — Consult Note (Addendum)
 NEUROLOGY CONSULT NOTE   Date of service: February 02, 2024 Patient Name: Jill Garza MRN:  [id number] DOB:  07-19-1950 Chief Complaint: dizziness, nausea Requesting Provider: Sundil, Subrina, MD  History of Present Illness  Jill Garza is a 73 y.o. female with hx of HTN, DM2, GERD, HLD who presents with episode of dizziness, diaphoresis that started at 1000AM on 02/01/24. Hada  similar episode last week that lasted 15 mins. Pt had a fall and hit her head back then but was fine.   She called her husband with the episode today and her speech was slurred and she was nauseous.  Patient initially presented to [facility] where she had CT Head with small remote appearing cerebellar infarcts. Case was discussed with Dr. Zollie with the neurology team and she felt this was a TIA and patient was transferred to [facility] for stroke/TIA evaluation.  Endorses that vertigo was sudden onset, was walking around her house, had some plumbing issues and had the plumber around. Vertigo lasted 15 mins, nauseous for an hour and a half. Husband felt speech was off on the phone.  She denies any prior hx of stroke, thou CT shows prior cerebellar infarcts, grandmother had stroke, quit smoking in 1980s. Does not use any recreational substances, reports most recent HbA1c outpatient was in [facility].  LKW: 01/31/24 Modified rankin score: 0-Completely asymptomatic and back to baseline post- stroke IV Thrombolysis: not offered, no deficits. EVT: not offered, no deficits.   NIHSS components Score: Comment  1a Level of Conscious 0[]  1[]  2[]  3[]      1b LOC Questions 0[]  1[]  2[]       1c LOC Commands 0[]  1[]  2[]       2 Best Gaze 0[]  1[]  2[]       3 Visual 0[]  1[]  2[]  3[]      4 Facial Palsy 0[]  1[]  2[]  3[]      5a Motor Arm - left 0[]  1[]  2[]  3[]  4[]  UN[]    5b Motor Arm - Right 0[]  1[]  2[]  3[]  4[]  UN[]    6a Motor Leg - Left 0[]  1[]  2[]  3[]  4[]  UN[]    6b Motor Leg - Right 0[]  1[]  2[]  3[]  4[]  UN[]    7 Limb Ataxia  0[]  1[]  2[]  UN[]      8 Sensory 0[]  1[]  2[]  UN[]      9 Best Language 0[]  1[]  2[]  3[]      10 Dysarthria 0[]  1[]  2[]  UN[]      11 Extinct. and Inattention 0[]  1[]  2[]       TOTAL: 0      ROS  Comprehensive ROS performed and pertinent positives documented in HPI   Past History   Past Medical History:  Diagnosis Date   Arthritis    Cancer (HCC)    melanoma- right leg   Chronic kidney disease    Depression    GERD (gastroesophageal reflux disease)    pt denies    History of hiatal hernia    Hypercholesterolemia    Hyperparathyroidism    Hypertension    Sweat, sweating, excessive    Type 2 diabetes mellitus (HCC)     Past Surgical History:  Procedure Laterality Date   ABDOMINAL HYSTERECTOMY  1979   APPENDECTOMY  1964   BREAST EXCISIONAL BIOPSY Left 08/2014   benign   BREAST LUMPECTOMY WITH RADIOACTIVE SEED LOCALIZATION Left 12/31/2014   Procedure: LEFT BREAST LUMPECTOMY WITH RADIOACTIVE SEED LOCALIZATION;  Surgeon: Donnice Lima, MD;  Location: [facility];  Service: General;  Laterality: Left;  CARPAL TUNNEL RELEASE Right 1977   CARPAL TUNNEL RELEASE Left 2007   CATARACT EXTRACTION Left 04/13/2014   Dr. Cleatus   CATARACT EXTRACTION Right 06/10/2014   Dr. Cleatus   CHOLECYSTECTOMY N/A 07/14/2017   Procedure: LAPAROSCOPIC CHOLECYSTECTOMY;  Surgeon: Teresa Lonni HERO, MD;  Location[facility];  Service: General;  Laterality: N/A;   COLONOSCOPY  2009   JOINT REPLACEMENT     MELANOMA EXCISION     PARATHYROIDECTOMY N/A 09/10/2020   Procedure: PARATHYROIDECTOMY;  Surgeon: Eletha Boas, MD;  Location: [facility];  Service: General;  Laterality: N/A;   THYROID  EXPLORATION N/A 09/10/2020   Procedure: POSSIBLE NECK EXPLORATION;  Surgeon: Eletha Boas, MD;  Location: [facility];  Service: General;  Laterality: N/A;   TONSILLECTOMY  1962   TOTAL KNEE ARTHROPLASTY Left 2012   TOTAL KNEE ARTHROPLASTY Right 2008    Family History: Family History  Problem Relation Age of Onset    Pulmonary embolism Mother        Multiple, B/L    Heart attack Father        Died of MI age 65   Hypertension Sister    Diabetes type II Sister    High Cholesterol Sister    Deep vein thrombosis Sister    Hypertension Brother    Heart disease Brother 35   Diabetes Brother    High Cholesterol Brother    Hypertension Brother    High Cholesterol Brother    Alcohol abuse Brother    Deep vein thrombosis Brother    Atrial fibrillation Brother    Hepatitis Brother    Cancer Maternal Grandmother 66   Reye's syndrome Daughter    Stroke Other    Colon cancer Neg Hx    Esophageal cancer Neg Hx    Stomach cancer Neg Hx    Rectal cancer Neg Hx     Social History  reports that she quit smoking about 37 years ago. Her smoking use included cigarettes. She started smoking about 52 years ago. She has a 15 pack-year smoking history. She has never used smokeless tobacco. She reports current alcohol use. She reports that she does not use drugs.  Allergies  Allergen Reactions   Penicillins     Rash    Ivp Dye [Iodinated Contrast Media]     Hives    Percocet [Oxycodone -Acetaminophen ]     Itching     Medications   Current Facility-Administered Medications:    [START ON 02/03/2024]  stroke: early stages of recovery book, , Does not apply, Once, Sundil, Subrina, MD   0.9 %  sodium chloride  infusion, 250 mL, Intravenous, PRN, Sundil, Subrina, MD   aspirin  EC tablet 81 mg, 81 mg, Oral, Daily, Sundil, Subrina, MD   diltiazem  (CARDIZEM ) tablet 30 mg, 30 mg, Oral, TID PRN, Sundil, Subrina, MD   diphenhydrAMINE  (BENADRYL ) capsule 50 mg, 50 mg, Oral, Once **OR** diphenhydrAMINE  (BENADRYL ) injection 50 mg, 50 mg, Intravenous, Once, Sundil, Subrina, MD   enoxaparin  (LOVENOX ) injection 40 mg, 40 mg, Subcutaneous, Q24H, Sundil, Subrina, MD   fenofibrate  tablet 160 mg, 160 mg, Oral, QHS, Sundil, Subrina, MD   glipiZIDE  (GLUCOTROL ) tablet 5 mg, 5 mg, Oral, BID AC, Sundil, Subrina, MD   Influenza vac  split trivalent PF (FLUZONE HIGH-DOSE) injection 0.5 mL, 0.5 mL, Intramuscular, Tomorrow-1000, Lama, Sabas RAMAN, MD   insulin  aspart (novoLOG ) injection 0-6 Units, 0-6 Units, Subcutaneous, TID WC PRN, Sundil, Subrina, MD   lactated ringers  infusion, , Intravenous, Continuous, Sundil, Subrina, MD, Last Rate: 75  mL/hr at 02/02/24 0340, New Bag at 02/02/24 0340   methylPREDNISolone  sodium succinate (SOLU-MEDROL ) 40 mg/mL injection 40 mg, 40 mg, Intravenous, Once, Sundil, Subrina, MD   ondansetron  (ZOFRAN ) tablet 4 mg, 4 mg, Oral, Q6H PRN **OR** ondansetron  (ZOFRAN ) injection 4 mg, 4 mg, Intravenous, Q6H PRN, Sundil, Subrina, MD   pantoprazole  (PROTONIX ) EC tablet 40 mg, 40 mg, Oral, Daily, Sundil, Subrina, MD   rosuvastatin  (CRESTOR ) tablet 5 mg, 5 mg, Oral, Once per day on Monday Wednesday Friday, Sundil, Subrina, MD   sodium chloride  flush (NS) 0.9 % injection 3 mL, 3 mL, Intravenous, Q12H, Sundil, Subrina, MD   sodium chloride  flush (NS) 0.9 % injection 3 mL, 3 mL, Intravenous, Q12H, Sundil, Subrina, MD   sodium chloride  flush (NS) 0.9 % injection 3 mL, 3 mL, Intravenous, PRN, Sundil, Subrina, MD  Vitals   Vitals:   02/01/24 2100 02/02/24 0000 02/02/24 0149 02/02/24 0348  BP: 138/72 136/83 (!) 127/90 119/78  Pulse: 92 96 96 88  Resp: 17 (!) 26 18 16   Temp:   98.1 F (36.7 C) 98 F (36.7 C)  TempSrc:   Oral Oral  SpO2: 96% 94% 96% 93%  Weight:      Height:   5' 4 (1.626 m)     Body mass index is 34.33 kg/m.   Physical Exam   General: Laying comfortably in bed; in no acute distress.  HENT: Normal oropharynx and mucosa. Normal external appearance of ears and nose.  Neck: Supple, no pain or tenderness  CV: No JVD. No peripheral edema.  Pulmonary: Symmetric Chest rise. Normal respiratory effort.  Abdomen: Soft to touch, non-tender.  Ext: No cyanosis, edema, or deformity  Skin: No rash. Normal palpation of skin.   Musculoskeletal: Normal digits and nails by inspection. No clubbing.    Neurologic Examination  Mental status/Cognition: Alert, oriented to self, place, month and year, good attention.  Speech/language: Fluent, comprehension intact, object naming intact, repetition intact.  Cranial nerves:   CN II Pupils equal and reactive to light, no VF deficits    CN III,IV,VI EOM intact, no gaze preference or deviation, no nystagmus    CN V normal sensation in V1, V2, and V3 segments bilaterally    CN VII no asymmetry, no nasolabial fold flattening    CN VIII normal hearing to speech    CN IX & X normal palatal elevation, no uvular deviation    CN XI 5/5 head turn and 5/5 shoulder shrug bilaterally    CN XII midline tongue protrusion    Motor:  Muscle bulk: normal, tone normal, pronator drift none tremor none Mvmt Root Nerve  Muscle Right Left Comments  SA C5/6 Ax Deltoid 5 5   EF C5/6 Mc Biceps 5 5   EE C6/7/8 Rad Triceps 5 5   WF C6/7 Med FCR     WE C7/8 PIN ECU     F Ab C8/T1 U ADM/FDI 5 5   HF L1/2/3 Fem Illopsoas 5 5   KE L2/3/4 Fem Quad 5 5   DF L4/5 D Peron Tib Ant 5 5   PF S1/2 Tibial Grc/Sol 5 5    Sensation:  Light touch Intact throughout   Pin prick    Temperature    Vibration   Proprioception    Coordination/Complex Motor:  - Finger to Nose intact BL - Heel to shin intact BL - Rapid alternating movement are normal - Gait: deferred.  Labs/Imaging/Neurodiagnostic studies   CBC:  Recent Labs  Lab  02/01/24 1329  WBC 6.7  NEUTROABS 4.3  HGB 13.4  HCT 41.2  MCV 87.1  PLT 300   Basic Metabolic Panel:  Lab Results  Component Value Date   NA 139 02/01/2024   K 4.3 02/01/2024   CO2 21 (L) 02/01/2024   GLUCOSE 153 (H) 02/01/2024   BUN 23 02/01/2024   CREATININE 1.35 (H) 02/01/2024   CALCIUM  10.5 (H) 02/01/2024   GFRNONAA 41 (L) 02/01/2024   GFRAA 48 (L) 07/24/2020   Lipid Panel:  Lab Results  Component Value Date   LDLCALC 49 08/15/2023   HgbA1c:  Lab Results  Component Value Date   HGBA1C 6.9 (H) 12/12/2023   Urine  Drug Screen: No results found for: LABOPIA, COCAINSCRNUR, LABBENZ, AMPHETMU, THCU, LABBARB  Alcohol Level No results found for: ETH INR No results found for: INR APTT No results found for: APTT AED levels: No results found for: PHENYTOIN, ZONISAMIDE, LAMOTRIGINE, LEVETIRACETA  CT Head without contrast(Personally reviewed): CTH was negative for a large hypodensity concerning for a large territory infarct or hyperdensity concerning for an ICH. Prior cerebellar infarcts that she is not aware of.  CT angio Head and Neck with contrast(Personally reviewed): No LVO.  MRI Brain(Personally reviewed): Pending  ASSESSMENT   Jill Garza is a 73 y.o. female with hx of HTN, DM2, GERD, HLD who presents with episode of dizziness, diaphoresis that started at 1000AM on 02/01/24. Hada  similar episode last week that lasted 15 mins. Pt had a fall and hit her head back then but was fine.   Had another today, she called her husband with the episode today and her speech was slurred and she was nauseous. Lasted about an hour and a half.  Episode concerning for a TIA. CT shows remote appearing cerebellar infarcts.  RECOMMENDATIONS  - Frequent Neuro checks per stroke unit protocol - Recommend brain imaging with MRI Brain without contrast - Recommend obtaining TTE  - Recommend obtaining Lipid panel with LDL - Please start statin if LDL > 70 - Recommend HbA1c to evaluate for diabetes and how well it is controlled. - Antithrombotic - Aspiri 81mg  dialy along with plavix  75mg  daily x 21days, followed by Aspirin  81mg  daily alone. - Recommend DVT ppx - SBP goal - permissive hypertension first 24 h < 220/110. Held home meds.  - Recommend Telemetry monitoring for arrythmia - Recommend bedside swallow screen prior to PO intake. - Stroke education booklet - Recommend PT/OT/SLP consult  ______________________________________________________________________  Plan discussed with Dr.  Sundil with the Hospitalist team.  I personally spent a total of 75 minutes in the care of the patient today including preparing to see the patient, getting/reviewing separately obtained history, performing a medically appropriate exam/evaluation, counseling and educating, placing orders, documenting clinical information in the EHR, independently interpreting results, and communicating results.   Signed, Jillene Wehrenberg, MD [facility]

## 2024-02-02 NOTE — Progress Notes (Addendum)
 STROKE TEAM PROGRESS NOTE    INTERIM HISTORY/SUBJECTIVE Patient presented with transient episode of headache nausea and some speech difficulties this time lasting an hour and a half.  In the past she has had similar but much more transient episodes. Patient remains hemodynamically stable and afebrile.  She reports that her symptoms have resolved. MRI scan shows a very questionable diffusion weak hyperintensity in the left frontal subcortical white matter without corresponding dark signal on ADC.  CT angiograms show no significant analysis stenosis or occlusion. OBJECTIVE  CBC    Component Value Date/Time   WBC 7.4 02/02/2024 0543   RBC 4.57 02/02/2024 0543   HGB 13.0 02/02/2024 0543   HGB 13.4 01/23/2015 0821   HCT 39.7 02/02/2024 0543   HCT 40.6 01/23/2015 0821   PLT 298 02/02/2024 0543   PLT 260 01/23/2015 0821   MCV 86.9 02/02/2024 0543   MCV 89 01/23/2015 0821   MCH 28.4 02/02/2024 0543   MCHC 32.7 02/02/2024 0543   RDW 12.3 02/02/2024 0543   RDW 13.6 01/23/2015 0821   LYMPHSABS 2.0 02/01/2024 1329   LYMPHSABS 2.6 01/23/2015 0821   MONOABS 0.3 02/01/2024 1329   EOSABS 0.1 02/01/2024 1329   EOSABS 0.1 01/23/2015 0821   BASOSABS 0.1 02/01/2024 1329   BASOSABS 0.0 01/23/2015 0821    BMET    Component Value Date/Time   NA 137 02/02/2024 0543   NA 142 09/05/2023 0000   K 4.2 02/02/2024 0543   CL 104 02/02/2024 0543   CO2 22 02/02/2024 0543   GLUCOSE 120 (H) 02/02/2024 0543   BUN 20 02/02/2024 0543   BUN 24 (A) 09/05/2023 0000   CREATININE 1.28 (H) 02/02/2024 0543   CREATININE 1.61 (H) 08/15/2023 0811   CALCIUM  9.5 02/02/2024 0543   EGFR 44 (L) 02/14/2023 0803   GFRNONAA 44 (L) 02/02/2024 0543   GFRNONAA 41 (L) 07/24/2020 1519    IMAGING past 24 hours MR BRAIN W WO CONTRAST Result Date: 02/02/2024 CLINICAL DATA:  Provided history: Ruling out stroke and other intracranial abnormality. EXAM: MRI HEAD WITHOUT AND WITH CONTRAST TECHNIQUE: Multiplanar, multiecho pulse  sequences of the brain and surrounding structures were obtained without and with intravenous contrast. CONTRAST:  10mL GADAVIST  GADOBUTROL  1 MMOL/ML IV SOLN COMPARISON:  Non-contrast head CT and CT angiogram head/neck 02/01/2024. MRI brain and orbits 10/17/2020. FINDINGS: Brain: No age-advanced or lobar predominant cerebral atrophy. 4 mm focus of diffusion-weighted signal hyperintensity within the left frontal lobe centrum semiovale (series 3, image 37). There is corresponding intermediate signal at this site on the [facility] map and this is most consistent with a subacute infarct. Chronic lacunar infarct within the right corona radiata, new from the prior MRI (series 7, image 22). Moderate multifocal T2 FLAIR hyperintense signal abnormality elsewhere within the cerebral white matter, nonspecific but compatible with chronic small vessel ischemic disease. Small chronic infarcts within the bilateral cerebellar hemispheres, new from the prior MRI. Partial empty sella turcica. No evidence of an intracranial mass. No extra-axial fluid collection. No midline shift. No pathologic intracranial enhancement identified. Vascular: Maintained flow voids within the proximal large arterial vessels. Skull and upper cervical spine: No focal worrisome marrow lesion. Sinuses/Orbits: No mass or acute finding within the imaged orbits. Prior bilateral ocular lens replacement. Trace mucosal thickening within the left ethmoid and right sphenoid sinuses. Other: Trace fluid within right mastoid air cells. IMPRESSION: 1. 4 mm probable subacute infarct within the left frontal lobe white matter. 2. Small chronic infarcts within the right corona radiata  and bilateral cerebellar hemispheres, new from the prior MRI of 10/17/2020. 3. Background moderate cerebral white matter chronic small vessel ischemic disease. 4. Trace right mastoid effusion. Electronically Signed   By: Rockey Childs D.O.   On: 02/02/2024 11:27   CT ANGIO HEAD NECK W WO CM Result  Date: 02/01/2024 CLINICAL DATA:  Initial evaluation for acute stroke/TIA. EXAM: CT ANGIOGRAPHY HEAD AND NECK WITH AND WITHOUT CONTRAST TECHNIQUE: Multidetector CT imaging of the head and neck was performed using the standard protocol during bolus administration of intravenous contrast. Multiplanar CT image reconstructions and MIPs were obtained to evaluate the vascular anatomy. Carotid stenosis measurements (when applicable) are obtained utilizing NASCET criteria, using the distal internal carotid diameter as the denominator. RADIATION DOSE REDUCTION: This exam was performed according to the departmental dose-optimization program which includes automated exposure control, adjustment of the mA and/or kV according to patient size and/or use of iterative reconstruction technique. CONTRAST:  60mL OMNIPAQUE  IOHEXOL  350 MG/ML SOLN COMPARISON:  CT from earlier the same day. FINDINGS: CTA NECK FINDINGS Aortic arch: Visualized arch within normal limits for caliber. Mild aortic atherosclerosis. Bovine branching pattern. No stenosis about the origin the great vessels. Right carotid system: Right common and internal carotid arteries are tortuous but patent without stenosis or dissection. Left carotid system: Left common and internal carotid arteries are tortuous but patent without stenosis or dissection. Vertebral arteries: Left vertebral artery dominant. No stenosis or dissection. Skeleton: No worrisome osseous lesions. Other neck: No other acute finding. Upper chest: No other acute finding. Review of the MIP images confirms the above findings CTA HEAD FINDINGS Anterior circulation: Both internal carotid arteries widely patent to the termini without stenosis. A1 segments widely patent. Normal anterior communicating artery complex. Both anterior cerebral arteries widely patent to their distal aspects without stenosis. No M1 stenosis or occlusion. Normal MCA bifurcations. Distal MCA branches well perfused and symmetric.  Posterior circulation: Both V4 segments patent without stenosis. Left vertebral artery dominant. Left PICA patent. Right PICA not seen. Basilar patent without stenosis. Superior cerebellar and posterior cerebral arteries patent bilaterally. Venous sinuses: Patent allowing for timing the contrast bolus. Anatomic variants: As above.  No aneurysm. Review of the MIP images confirms the above findings IMPRESSION: 1. Negative CTA of the head and neck. No large vessel occlusion or other emergent finding. No hemodynamically significant or correctable stenosis. 2. Diffuse tortuosity of the major arterial vasculature of the head and neck, suggesting chronic underlying hypertension. Aortic Atherosclerosis (ICD10-I70.0). Electronically Signed   By: Morene Hoard M.D.   On: 02/01/2024 21:47   CT Head Wo Contrast Result Date: 02/01/2024 CLINICAL DATA:  Neuro deficit, acute, stroke suspected dizziness EXAM: CT HEAD WITHOUT CONTRAST TECHNIQUE: Contiguous axial images were obtained from the base of the skull through the vertex without intravenous contrast. RADIATION DOSE REDUCTION: This exam was performed according to the departmental dose-optimization program which includes automated exposure control, adjustment of the mA and/or kV according to patient size and/or use of iterative reconstruction technique. COMPARISON:  MRI head October 17, 2020. FINDINGS: Brain: No evidence of acute infarction, hemorrhage, hydrocephalus, extra-axial collection or mass lesion/mass effect. Patchy white matter hypodensities are nonspecific but compatible with chronic microvascular ischemic change. Small remote appearing cerebellar infarcts. Vascular: No hyperdense vessel identified. Skull: No acute fracture. Sinuses/Orbits: Clear sinuses.  No acute orbital findings. IMPRESSION: 1. No evidence of acute intracranial abnormality. 2. 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 Electronically Signed   By:  Gilmore GORMAN Joshua CHRISTELLA.D.  On: 02/01/2024 14:09    Vitals:   02/02/24 0149 02/02/24 0348 02/02/24 0741 02/02/24 1238  BP: (!) 127/90 119/78 138/67 (!) 140/66  Pulse: 96 88 86 78  Resp: 18 16 18 18   Temp: 98.1 F (36.7 C) 98 F (36.7 C) 98.1 F (36.7 C) 97.7 F (36.5 C)  TempSrc: Oral Oral Oral Oral  SpO2: 96% 93% 93% 93%  Weight:      Height: 5' 4 (1.626 m)        PHYSICAL EXAM General:  Alert, well-nourished, well-developed patient in no acute distress Psych:  Mood and affect appropriate for situation CV: Regular rate and rhythm on monitor Respiratory:  Regular, unlabored respirations on room air   NEURO:  Mental Status: AA&Ox3, patient is able to give clear and coherent history Speech/Language: speech is without dysarthria or aphasia.    Cranial Nerves:  II: PERRL. Visual fields full.  III, IV, VI: EOMI. Eyelids elevate symmetrically.  V: Sensation is intact to light touch and symmetrical to face.  VII: Face is symmetrical resting and smiling VIII: hearing intact to voice. IX, X: Phonation is normal.  KP:Dynloizm shrug 5/5. XII: tongue is midline without fasciculations. Motor: 5/5 strength to all muscle groups tested.  Tone: is normal and bulk is normal Sensation- Intact to light touch bilaterally.  Coordination: FTN intact bilaterally, HKS: no ataxia in BLE. Gait- deferred  Most Recent NIH 0  ASSESSMENT/PLAN  [name] is a 73 y.o. female with history of hypertension, hyperlipidemia, diabetes and GERD admitted for an acute episode of dizziness and diaphoresis.  She reports having a similar episode last week which lasted about 15 minutes and resolved spontaneously.  MRI reveals small subacute infarct in left frontal lobe white matter which does not explain her symptoms.  Posterior TIA is a more likely etiology.  NIH on Admission 0  Left frontal lobe white matter infarct, likely due to small vessel disease and possible concurrent posterior TIA CT head  No acute abnormality.  CTA head & neck no LVO or hemodynamically significant stenosis, diffuse tortuosity of major arterial vasculature MRI subacute infarct within left frontal lobe white matter, moderate white matter chronic small vessel ischemic disease 2D Echo pending LDL 51 HgbA1c 6.1 VTE prophylaxis -Lovenox  No antithrombotic prior to admission, now on aspirin  81 mg daily and clopidogrel  75 mg daily for 3 weeks and then aspirin  alone. Therapy recommendations:  No follow up needed  Disposition: Pending, likely home  Hypertension Home meds: Amlodipine  5 mg daily, losartan  100 mg daily Stable Blood Pressure Goal: BP less than 220/110   Hyperlipidemia Home meds: Fenofibrate  145 mg daily, increased to 160, rosuvastatin  5 mg daily LDL 51, goal < 70 High intensity statin not indicated as LDL below goal Continue statin at discharge  Diabetes type II Controlled Home meds: Glipizide  5 mg twice daily HgbA1c 6.1, goal < 7.0 CBGs SSI Recommend close follow-up with PCP for better DM control  Other Stroke Risk Factors Obesity, Body mass index is 34.33 kg/m., BMI >/= 30 associated with increased stroke risk, recommend weight loss, diet and exercise as appropriate   Other Active Problems None  Hospital day # 0  Patient seen by NP with MD, MD to edit note as needed. Cortney E Everitt Clint Kill , MSN, AGACNP-BC [facility] See Amion for schedule and pager information 02/02/2024 1:59 PM   I have personally obtained history,examined this patient, reviewed notes, independently viewed imaging studies, participated in medical decision making and plan of care.ROS  completed by me personally and pertinent positives fully documented  I have made any additions or clarifications directly to the above note. Agree with note above.  Patient presented with transient headache nausea and speech difficulties possibly a TIA and MRI scan shows questionable left frontal white matter subacute infarct  versus artifact.  Recommend aspirin  and Plavix  for 3 weeks followed by aspirin  alone and aggressive risk factor modification.  Long discussion patient answered questions.  Discussed with Dr. Samtani.  Follow-up as outpatient stroke clinic with nurse practitioner in 2 months.   I personally spent a total of 50 minutes in the care of the patient today including getting/reviewing separately obtained history, performing a medically appropriate exam/evaluation, counseling and educating, placing orders, referring and communicating with other health care professionals, documenting clinical information in the EHR, independently interpreting results, and coordinating care.         Eather Popp, MD Medical Director [facility] Pager: [phone] 02/02/2024 2:21 PM   To contact Stroke Continuity provider, please refer to [email or web address]. After hours, contact General Neurology

## 2024-02-02 NOTE — Progress Notes (Signed)
 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 Elmira Previous frequent falls earlier in September 9/25 present [facility] episodic slurred speech nausea and neurology felt that this was a TIA Vertigo was sudden in onset lasted 15 minutes Workup revealed   Labs on admit Sodium 139 potassium 4.3 bicarb 21 BUN/creatinine 23/1.3 WBC 6.7 hemoglobin 13.4 platelet 300 A1c 6.1  Imaging CT head showed no evidence of acute intracranial abnormality and small remote cerebellar infarcts CT angio negative for LVO diffuse tortuosity of major arterial vasculature head and neck suggesting chronic hypertension   Assessment  & Plan :    TIA Questionable TIA at best-GDMT per neurology Aspirin  Plavix  3 weeks with aspirin  alone subsequent PSVT Follows Dr. Elmira cardiology has an event monitor in place and will follow-up as an outpatient with him we will CC him at discharge Was not taking Cardizem ?  Will have to see and discuss further with her BMI 34 class I obesity Prediabetes A1c 6.1 Resume glipizide  5 twice daily sliding scale continues CBG ranging 92-1 20 Outpatient weight loss strategies as per PCP Smoker total 1988 with dyspnea on exertion follows with Dr. Nyra Continue further workup as per Dr. Belma component of restrictive physiology No wheeze rales rhonchi therefore stop Solu-Medrol  CKD 1 Monitor      Data Reviewed:   Sodium 137 potassium 4.2 chloride 104 BUN/creatinine 20/1.2 LFTs within normal range Total cholesterol 105 LDL 51 triglyceride 56 WBC 7.4 hemoglobin 13 platelet 298  DVT prophylaxis: Lovenox   Status is: Inpatient Remains inpatient appropriate  because: Await further workup follow-up echo etc.     Current Dispo: Home likely in a.m.     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 Has never had strokelike symptoms before   Objective + exam Vitals:   02/02/24 0000 02/02/24 0149 02/02/24 0348 02/02/24 0741  BP: 136/83 (!) 127/90 119/78 138/67  Pulse: 96 96 88 86  Resp: (!) 26 18 16 18   Temp:  98.1 F (36.7 C) 98 F (36.7 C) 98.1 F (36.7 C)  TempSrc:  Oral Oral Oral  SpO2: 94% 96% 93% 93%  Weight:      Height:  5' 4 (1.626 m)     Filed Weights   02/01/24 1315  Weight: 90.7 kg     Examination: EOMI NCAT no focal deficit no icterus no pallor No facial droop smile symmetric shoulder shrug bilaterally equal strength 5/5 upper limbs and lower limbs Sensory deferred Reflexes 2/3 Abdomen obese nontender Chest is clear S1-S2 no murmur-telemetry First-degree AV block sinus predominantly   Scheduled Meds:  [START ON 02/03/2024]  stroke: early stages of recovery book   Does not apply Once   aspirin  EC  81 mg Oral Daily   clopidogrel   75 mg Oral Daily   diphenhydrAMINE   50 mg Oral Once   Or   diphenhydrAMINE   50 mg Intravenous Once   enoxaparin  (LOVENOX )  injection  40 mg Subcutaneous Q24H   fenofibrate   160 mg Oral QHS   glipiZIDE   5 mg Oral BID AC   methylPREDNISolone  (SOLU-MEDROL ) injection  40 mg Intravenous Once   pantoprazole   40 mg Oral Daily   rosuvastatin   5 mg Oral Once per day on Monday Wednesday Friday   sodium chloride  flush  3 mL Intravenous Q12H   sodium chloride  flush  3 mL Intravenous Q12H   Continuous Infusions:  sodium chloride      lactated ringers  75 mL/hr at 02/02/24 0340    Time 23  Colen Grimes, MD  [facility]

## 2024-02-02 NOTE — Evaluation (Addendum)
 Occupational Therapy Evaluation Patient Details Name: Jill Garza MRN: [id number] DOB: Apr 12, 1951 Today's Date: 02/02/2024   History of Present Illness   73 y.o. female presents to [facility] 02/01/24 after falling and hitting her head 2/2 episode of dizziness. Episode concerning for TIA, CT showed remote appearing cerebellar infarcts. MRI pending. PMHx:  HTN, DM2, GERD, HLD, CKD 3B, breast cancer s/p lumpectomy with radioactive seed localization 2016, history of hysterectomy, history of melanoma excision, cholecystectomy and cataract extraction of the bilateral eyes     Clinical Impressions Pt greeted in supine, agreeable for OT visit. AOX4. PTA, pt was indep with ADLs, iADls, driving, and household maintenance. She is a retired Char[employer] fundraiser. She presents with intact strength, sensation, coordination. Orthostatic vitals (-), pt asymptomatic with head turns without eliciting dizziness. Functionally, she was indep with UB/LB ADLs and functional transfers/mobility without AD.  Pt is functioning at a level that is adequate for discharge from an OT standpoint. Acute OT to sign-off.     If plan is discharge home, recommend the following:   Assistance with cooking/housework (PRN assist with household maintenance)     Functional Status Assessment         Equipment Recommendations   None recommended by OT     Recommendations for Other Services         Precautions/Restrictions   Precautions Precautions: Fall Recall of Precautions/Restrictions: Intact Restrictions Weight Bearing Restrictions Per Provider Order: No     Mobility Bed Mobility Overal bed mobility: Independent                  Transfers Overall transfer level: Modified independent Equipment used: None               General transfer comment: Used bed rail to come into standing.      Balance Overall balance assessment: Independent                                         ADL either  performed or assessed with clinical judgement   ADL Overall ADL's : Modified independent                                       General ADL Comments: performed LE dressing long sitting in bed, oral hygiene & toileting indep standing at sink/in bathroom     Vision Baseline Vision/History: 4 Cataracts Ability to See in Adequate Light: 0 Adequate Patient Visual Report: No change from baseline Vision Assessment?: Wears glasses for reading;Wears glasses for driving;No apparent visual deficits     Perception         Praxis         Pertinent Vitals/Pain Pain Assessment Pain Assessment: No/denies pain     Extremity/Trunk Assessment Upper Extremity Assessment Upper Extremity Assessment: Overall WFL for tasks assessed   Lower Extremity Assessment Lower Extremity Assessment: Defer to PT evaluation       Communication Communication Communication: No apparent difficulties   Cognition Arousal: Alert Behavior During Therapy: WFL for tasks assessed/performed Cognition: No apparent impairments                               Following commands: Intact       Cueing  General Comments   Cueing Techniques:  Verbal cues      Exercises     Shoulder Instructions      Home Living Family/patient expects to be discharged to:: Private residence Living Arrangements: Spouse/significant other Available Help at Discharge: Family Type of Home: House Home Access: Stairs to enter [employer] of Steps: 1-2 STE Entrance Stairs-Rails: Right Home Layout: Two level;Full bath on main level;Able to live on main level with bedroom/bathroom Alternate Level Stairs-Number of Steps: FF (does not need to access 2nd level)   Bathroom Shower/Tub: Producer, television/film/video: Handicapped height     Home Equipment: Agricultural consultant (2 wheels);Cane - single point;Tub bench;Grab bars - tub/shower;Grab bars - toilet          Prior  Functioning/Environment Prior Level of Function : Independent/Modified Independent             Mobility Comments: No DME PTA ADLs Comments: indep with ADLs, driving, and household maintenance    OT Problem List:     OT Treatment/Interventions:        OT Goals(Current goals can be found in the care plan section)   Acute Rehab OT Goals Patient Stated Goal: to go to [place]  next week   OT Frequency:       Co-evaluation              AM-PAC OT 6 Clicks Daily Activity     Outcome Measure Help from another person eating meals?: None Help from another person taking care of personal grooming?: None Help from another person toileting, which includes using toliet, bedpan, or urinal?: None Help from another person bathing (including washing, rinsing, drying)?: None Help from another person to put on and taking off regular upper body clothing?: None Help from another person to put on and taking off regular lower body clothing?: None 6 Click Score: 24   End of Session Equipment Utilized During Treatment: Gait belt Nurse Communication: Mobility status  Activity Tolerance: Patient tolerated treatment well Patient left:  (left in w/c with transport to go for MRI)  OT Visit Diagnosis: Unsteadiness on feet (R26.81);Dizziness and giddiness (R42)                Time: 9149-9080 OT Time Calculation (min): 29 min Charges:  OT General Charges $OT Visit: 1 Visit OT Evaluation $OT Eval Low Complexity: 1 Low OT Treatments $Self Care/Home Management : 8-22 mins  Jill Garza D., MSOT, OTR/L Acute Rehabilitation Services [phone] Secure Chat Preferred   Jill Garza 02/02/2024, 10:37 AM

## 2024-02-02 NOTE — Plan of Care (Signed)
 Discussed the need to go down for MRI and ECHO today. Eating without problems. Ambulating in halls with therapy early this am.    Problem: Education: Goal: Knowledge of General Education information will improve Description: Including pain rating scale, medication(s)/side effects and non-pharmacologic comfort measures Outcome: Progressing   Problem: Activity: Goal: Risk for activity intolerance will decrease Outcome: Progressing   Problem: Ischemic Stroke/TIA Tissue Perfusion: Goal: Complications of ischemic stroke/TIA will be minimized Outcome: Progressing   Problem: Coping: Goal: Will verbalize positive feelings about self Outcome: Progressing   Problem: Safety: Goal: Ability to remain free from injury will improve Outcome: Progressing   Problem: Skin Integrity: Goal: Risk for impaired skin integrity will decrease Outcome: Progressing

## 2024-02-03 ENCOUNTER — Inpatient Hospital Stay (HOSPITAL_COMMUNITY)

## 2024-02-03 ENCOUNTER — Other Ambulatory Visit (HOSPITAL_COMMUNITY): Payer: Self-pay

## 2024-02-03 ENCOUNTER — Other Ambulatory Visit: Payer: Self-pay | Admitting: Neurology

## 2024-02-03 DIAGNOSIS — I6381 Other cerebral infarction due to occlusion or stenosis of small artery: Secondary | ICD-10-CM | POA: Diagnosis not present

## 2024-02-03 DIAGNOSIS — R9089 Other abnormal findings on diagnostic imaging of central nervous system: Secondary | ICD-10-CM

## 2024-02-03 DIAGNOSIS — R42 Dizziness and giddiness: Secondary | ICD-10-CM | POA: Diagnosis not present

## 2024-02-03 DIAGNOSIS — E1151 Type 2 diabetes mellitus with diabetic peripheral angiopathy without gangrene: Secondary | ICD-10-CM | POA: Diagnosis not present

## 2024-02-03 DIAGNOSIS — I6389 Other cerebral infarction: Secondary | ICD-10-CM | POA: Diagnosis not present

## 2024-02-03 DIAGNOSIS — R29700 NIHSS score 0: Secondary | ICD-10-CM | POA: Diagnosis not present

## 2024-02-03 DIAGNOSIS — Z7984 Long term (current) use of oral hypoglycemic drugs: Secondary | ICD-10-CM | POA: Diagnosis not present

## 2024-02-03 DIAGNOSIS — R569 Unspecified convulsions: Secondary | ICD-10-CM

## 2024-02-03 DIAGNOSIS — I639 Cerebral infarction, unspecified: Secondary | ICD-10-CM

## 2024-02-03 LAB — ECHOCARDIOGRAM COMPLETE
AR max vel: 3.3 cm2
AV Area VTI: 3.09 cm2
AV Area mean vel: 2.86 cm2
AV Mean grad: 7 mmHg
AV Peak grad: 12.4 mmHg
Ao pk vel: 1.76 m/s
Area-P 1/2: 3.65 cm2
Height: 64 in
S' Lateral: 3.1 cm
Weight: 3200 [oz_av]

## 2024-02-03 LAB — GLUCOSE, CAPILLARY: Glucose-Capillary: 172 mg/dL — ABNORMAL HIGH (ref 70–99)

## 2024-02-03 MED ORDER — ASPIRIN 81 MG PO TBEC
81.0000 mg | DELAYED_RELEASE_TABLET | Freq: Every day | ORAL | 12 refills | Status: AC
  Filled 2024-02-03: qty 30, 30d supply, fill #0

## 2024-02-03 MED ORDER — DILTIAZEM HCL 30 MG PO TABS
30.0000 mg | ORAL_TABLET | Freq: Three times a day (TID) | ORAL | 3 refills | Status: AC | PRN
  Filled 2024-02-03: qty 90, 30d supply, fill #0

## 2024-02-03 MED ORDER — CLOPIDOGREL BISULFATE 75 MG PO TABS
75.0000 mg | ORAL_TABLET | Freq: Every day | ORAL | 0 refills | Status: DC
  Filled 2024-02-03: qty 20, 20d supply, fill #0

## 2024-02-03 MED ORDER — PANTOPRAZOLE SODIUM 40 MG PO TBEC
40.0000 mg | DELAYED_RELEASE_TABLET | Freq: Every day | ORAL | 2 refills | Status: DC
  Filled 2024-02-03: qty 30, 30d supply, fill #0

## 2024-02-03 MED ORDER — PERFLUTREN LIPID MICROSPHERE
1.0000 mL | INTRAVENOUS | Status: AC | PRN
  Administered 2024-02-03: 3 mL via INTRAVENOUS

## 2024-02-03 NOTE — Progress Notes (Addendum)
 STROKE TEAM PROGRESS NOTE    INTERIM HISTORY/SUBJECTIVE Patient In bed NAD. EEG just completed. Patient annoyed that ECHO has not been done.  OBJECTIVE  CBC    Component Value Date/Time   WBC 7.4 02/02/2024 0543   RBC 4.57 02/02/2024 0543   HGB 13.0 02/02/2024 0543   HGB 13.4 01/23/2015 0821   HCT 39.7 02/02/2024 0543   HCT 40.6 01/23/2015 0821   PLT 298 02/02/2024 0543   PLT 260 01/23/2015 0821   MCV 86.9 02/02/2024 0543   MCV 89 01/23/2015 0821   MCH 28.4 02/02/2024 0543   MCHC 32.7 02/02/2024 0543   RDW 12.3 02/02/2024 0543   RDW 13.6 01/23/2015 0821   LYMPHSABS 2.0 02/01/2024 1329   LYMPHSABS 2.6 01/23/2015 0821   MONOABS 0.3 02/01/2024 1329   EOSABS 0.1 02/01/2024 1329   EOSABS 0.1 01/23/2015 0821   BASOSABS 0.1 02/01/2024 1329   BASOSABS 0.0 01/23/2015 0821    BMET    Component Value Date/Time   NA 137 02/02/2024 0543   NA 142 09/05/2023 0000   K 4.2 02/02/2024 0543   CL 104 02/02/2024 0543   CO2 22 02/02/2024 0543   GLUCOSE 120 (H) 02/02/2024 0543   BUN 20 02/02/2024 0543   BUN 24 (A) 09/05/2023 0000   CREATININE 1.28 (H) 02/02/2024 0543   CREATININE 1.61 (H) 08/15/2023 0811   CALCIUM  9.5 02/02/2024 0543   EGFR 44 (L) 02/14/2023 0803   GFRNONAA 44 (L) 02/02/2024 0543   GFRNONAA 41 (L) 07/24/2020 1519    IMAGING past 24 hours ECHOCARDIOGRAM COMPLETE Result Date: 02/03/2024    ECHOCARDIOGRAM REPORT   Patient Name:   Jill Garza Date of Exam: 02/03/2024 Medical Rec #:  [id number]          Height:       64.0 in Accession #:    [id number]         Weight:       200.0 lb Date of Birth:  11-Jan-1951          BSA:          1.956 m Patient Age:    73 years           BP:           164/67 mmHg Patient Gender: F                  HR:           63 bpm. Exam Location:  Inpatient Procedure: 2D Echo, Cardiac Doppler, Color Doppler, Strain Analysis and            Intracardiac Opacification Agent (Both Spectral and Color Flow            Doppler were utilized during  procedure). Indications:    Stroke I63.9  History:        Patient has no prior history of Echocardiogram examinations.                 Risk Factors:Hypertension, Diabetes, Dyslipidemia and Family                 History of Coronary Artery Disease. Hx of CKD and PSVT.  Sonographer:    Aida Pizza RCS Referring Phys: [phone] [facility]  Sonographer Comments: Global longitudinal strain was attempted. IMPRESSIONS  1. Left ventricular ejection fraction, by estimation, is 60 to 65%. The left ventricle has normal function. The left ventricle has no regional wall motion abnormalities.  2. Right ventricular  systolic function is normal. The right ventricular size is normal. There is normal pulmonary artery systolic pressure. The estimated right ventricular systolic pressure is 18.1 mmHg.  3. Left atrial size was mildly dilated.  4. The mitral valve is normal in structure. No evidence of mitral valve regurgitation. No evidence of mitral stenosis.  5. The aortic valve is calcified. There is mild calcification of the aortic valve. There is mild thickening of the aortic valve. Aortic valve regurgitation is mild. Aortic valve sclerosis is present, with no evidence of aortic valve stenosis. Aortic valve mean gradient measures 7.0 mmHg. Aortic valve Vmax measures 1.76 m/s.  6. The inferior vena cava is normal in size with greater than 50% respiratory variability, suggesting right atrial pressure of 3 mmHg. Conclusion(s)/Recommendation(s): No intracardiac source of embolism detected on this transthoracic study. Consider a transesophageal echocardiogram to exclude cardiac source of embolism if clinically indicated. FINDINGS  Left Ventricle: Left ventricular ejection fraction, by estimation, is 60 to 65%. The left ventricle has normal function. The left ventricle has no regional wall motion abnormalities. Definity contrast agent was given IV to delineate the left ventricular  endocardial borders. Global longitudinal strain  performed but not reported based on interpreter judgement due to suboptimal tracking. The left ventricular internal cavity size was normal in size. There is no left ventricular hypertrophy. Right Ventricle: The right ventricular size is normal. No increase in right ventricular wall thickness. Right ventricular systolic function is normal. There is normal pulmonary artery systolic pressure. The tricuspid regurgitant velocity is 1.94 m/s, and  with an assumed right atrial pressure of 3 mmHg, the estimated right ventricular systolic pressure is 18.1 mmHg. Left Atrium: Left atrial size was mildly dilated. Right Atrium: Right atrial size was normal in size. Pericardium: There is no evidence of pericardial effusion. Mitral Valve: The mitral valve is normal in structure. No evidence of mitral valve regurgitation. No evidence of mitral valve stenosis. Tricuspid Valve: The tricuspid valve is normal in structure. Tricuspid valve regurgitation is trivial. No evidence of tricuspid stenosis. Aortic Valve: The aortic valve is calcified. There is mild calcification of the aortic valve. There is mild thickening of the aortic valve. Aortic valve regurgitation is mild. Aortic valve sclerosis is present, with no evidence of aortic valve stenosis. Aortic valve mean gradient measures 7.0 mmHg. Aortic valve peak gradient measures 12.4 mmHg. Aortic valve area, by VTI measures 3.09 cm. Pulmonic Valve: The pulmonic valve was normal in structure. Pulmonic valve regurgitation is not visualized. No evidence of pulmonic stenosis. Aorta: The aortic root is normal in size and structure. Venous: The inferior vena cava is normal in size with greater than 50% respiratory variability, suggesting right atrial pressure of 3 mmHg. IAS/Shunts: No atrial level shunt detected by color flow Doppler.  LEFT VENTRICLE PLAX 2D LVIDd:         4.50 cm   Diastology LVIDs:         3.10 cm   LV e' medial:    8.92 cm/s LV PW:         1.00 cm   LV E/e' medial:  9.3  LV IVS:        1.10 cm   LV e' lateral:   10.70 cm/s LVOT diam:     2.00 cm   LV E/e' lateral: 7.7 LV SV:         123 LV SV Index:   63        2D Longitudinal Strain LVOT Area:  3.14 cm  2D Strain GLS Avg:     -11.4 %  RIGHT VENTRICLE RV S prime:     11.90 cm/s TAPSE (M-mode): 2.6 cm LEFT ATRIUM             Index        RIGHT ATRIUM           Index LA diam:        3.50 cm 1.79 cm/m   RA Area:     16.50 cm LA Vol (A2C):   70.3 ml 35.93 ml/m  RA Volume:   38.30 ml  19.58 ml/m LA Vol (A4C):   63.5 ml 32.46 ml/m LA Biplane Vol: 69.2 ml 35.37 ml/m  AORTIC VALVE AV Area (Vmax):    3.30 cm AV Area (Vmean):   2.86 cm AV Area (VTI):     3.09 cm AV Vmax:           176.00 cm/s AV Vmean:          124.000 cm/s AV VTI:            0.396 m AV Peak Grad:      12.4 mmHg AV Mean Grad:      7.0 mmHg LVOT Vmax:         185.00 cm/s LVOT Vmean:        113.000 cm/s LVOT VTI:          0.390 m LVOT/AV VTI ratio: 0.98  AORTA Ao Root diam: 3.90 cm MITRAL VALVE               TRICUSPID VALVE MV Area (PHT): 3.65 cm    TR Peak grad:   15.1 mmHg MV Decel Time: 208 msec    TR Vmax:        194.00 cm/s MV E velocity: 82.70 cm/s MV A velocity: 96.70 cm/s  SHUNTS MV E/A ratio:  0.86        Systemic VTI:  0.39 m                            Systemic Diam: 2.00 cm Oneil Parchment MD Electronically signed by Oneil Parchment MD Signature Date/Time: 02/03/2024/1:01:46 PM    Final     Vitals:   02/02/24 2110 02/03/24 0543 02/03/24 0900 02/03/24 1148  BP: (!) 140/67 (!) 166/57 (!) 164/67 (!) 145/60  Pulse: 69 (!) 55 63   Resp: 18 18 18 18   Temp: 98.1 F (36.7 C) 98 F (36.7 C) 97.7 F (36.5 C) 98.1 F (36.7 C)  TempSrc: Oral Oral Oral Oral  SpO2: 96% 96% 93% 95%  Weight:      Height:         PHYSICAL EXAM  General:  Alert, well-nourished, well-developed patient in no acute distress Psych:  Mood and affect appropriate for situation CV: Regular rate and rhythm on monitor Respiratory:  Regular, unlabored respirations on room  air   NEURO:  Mental Status: AA&Ox3, patient is able to give clear and coherent history Speech/Language: speech is without dysarthria or aphasia.    Cranial Nerves:  II: PERRL. Visual fields full.  III, IV, VI: EOMI. Eyelids elevate symmetrically.  V: Sensation is intact to light touch and symmetrical to face.  VII: Face is symmetrical resting and smiling VIII: hearing intact to voice. IX, X: Phonation is normal.  KP:Dynloizm shrug 5/5. XII: tongue is midline without fasciculations. Motor: 5/5 strength to all muscle groups tested.  Tone:  is normal and bulk is normal Sensation- Intact to light touch bilaterally.  Coordination: FTN intact bilaterally, HKS: no ataxia in BLE. Gait- deferred  Most Recent NIH 0  ASSESSMENT/PLAN  [name] is a 73 y.o. female with history of hypertension, hyperlipidemia, diabetes and GERD admitted for an acute episode of dizziness and diaphoresis.  She reports having a similar episode last week which lasted about 15 minutes and resolved spontaneously.  MRI reveals small subacute infarct in left frontal lobe white matter which does not explain her symptoms.  Posterior TIA is a more likely etiology.  NIH on Admission 0  Left frontal SO DWI change - subacute small vessel lacunar infarct vs. T2 shine-through CT head No acute abnormality.  CTA head & neck no LVO or hemodynamically significant stenosis, diffuse tortuosity of major arterial vasculature MRI subacute infarct within left frontal lobe white matter, moderate white matter chronic small vessel ischemic disease 2D Echo EF 60 to 65% EEG normal LDL 51 HgbA1c 6.1 VTE prophylaxis -Lovenox  No antithrombotic prior to admission, now on aspirin  81 mg daily and clopidogrel  75 mg daily for 3 weeks and then aspirin  alone. Therapy recommendations:  No follow up needed  Disposition: home  Hypertension Home meds: Amlodipine  5 mg daily, losartan  100 mg daily Stable Orthostatic vitals no  orthostatic hypotension Long-term BP goal normotensive  Hyperlipidemia Home meds: Fenofibrate  145 mg daily, rosuvastatin  5 mg daily LDL 51, goal < 70 High intensity statin not indicated as LDL below goal Continue statin and fenofibrate  at discharge  Diabetes type II Controlled Home meds: Glipizide  5 mg twice daily HgbA1c 6.1, goal < 7.0 CBGs SSI Recommend close follow-up with PCP for better DM control  Other Stroke Risk Factors Obesity, Body mass index is 34.33 kg/m., BMI >/= 30 associated with increased stroke risk, recommend weight loss, diet and exercise as appropriate   Other Active Problems CKD 3A, creatinine 1.35--1.28  Hospital day # 1  Neurology will sign off. Please call with questions. Pt will follow up with stroke clinic NP at [facility] in about 4 weeks. Thanks for the consult.  Ary Cummins, MD PhD Stroke Neurology 02/03/2024 9:59 PM    To contact Stroke Continuity provider, please refer to [email or web address]. After hours, contact General Neurology

## 2024-02-03 NOTE — Progress Notes (Signed)
 EEG complete - results pending

## 2024-02-03 NOTE — Progress Notes (Signed)
 Pt being d/c.  PIVs removed Discharge information given.  Pt verb understanding and had no further questions  Wheelchaired pt out to vehicle.

## 2024-02-03 NOTE — Procedures (Signed)
 Patient Name: Shuronda Santino  MRN: [id number]  Epilepsy Attending: Arlin MALVA Krebs  Referring Physician/Provider: Jerri Pfeiffer, MD  Date: 02/03/2024 Duration: 22.26 mins  Patient history: 73 y.o. female with history of hypertension, hyperlipidemia, diabetes and GERD admitted for an acute episode of dizziness and diaphoresis.  EEG to evaluate for seizure  Level of alertness: Awake  AEDs during EEG study: None  Technical aspects: This EEG study was done with scalp electrodes positioned according to the 10-20 International system of electrode placement. Electrical activity was reviewed with band pass filter of 1-70Hz , sensitivity of 7 uV/mm, display speed of 29mm/sec with a 60Hz  notched filter applied as appropriate. EEG data were recorded continuously and digitally stored.  Video monitoring was available and reviewed as appropriate.  Description: The posterior dominant rhythm consists of 9Hz  activity of moderate voltage (25-35 uV) seen predominantly in posterior head regions, symmetric and reactive to eye opening and eye closing. Physiologic photic driving was seen during photic stimulation.  Hyperventilation was not performed.     IMPRESSION: This study is within normal limits. No seizures or epileptiform discharges were seen throughout the recording.  A normal interictal EEG does not exclude the diagnosis of epilepsy.   Asya Derryberry O Danette Weinfeld

## 2024-02-03 NOTE — Discharge Summary (Signed)
 Physician Discharge Summary  Jill Garza [facility] FMW:[id number] DOB: 08-08-1950 DOA: 02/01/2024  PCP: Jill Harlene POUR, NP  Admit date: 02/01/2024 Discharge date: 02/03/2024  Time spent: 40 minutes  Recommendations for Outpatient Follow-up:  Needs outpatient reevaluation of labs in 1 to 2 weeks at PCP office CC Dr Jill Garza for outpatient follow-up in about 6 weeks for stroke CC Dr. Elmira for further workup of SVT ILR etc.  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 Elmira Previous frequent falls earlier in September 9/25 present [facility] episodic slurred speech nausea and neurology felt that this was a TIA Vertigo was sudden in onset lasted 15 minutes Workup revealed     Labs on admit Sodium 139 potassium 4.3 bicarb 21 BUN/creatinine 23/1.3 WBC 6.7 hemoglobin 13.4 platelet 300 A1c 6.1   Imaging CT head showed no evidence of acute intracranial abnormality and small remote cerebellar infarcts CT angio negative for LVO diffuse tortuosity of major arterial vasculature head and neck suggesting chronic hypertension Echocardiogram showed EF 60-65% normal valves with mild aortic valve regurg no stenosis no intracardiac source of embolism    Assessment  & Plan :      TIA Questionable TIA at best-GDMT per neurology Aspirin  Plavix  3 weeks with aspirin  alone subsequent PSVT Follows Dr. Elmira cardiology has an event monitor in place and will follow-up as an outpatient with him we will CC him at discharge Was not taking Cardizem  and  I have refilled this for her she knows how to do vasovagal maneuvers BMI 34 class I obesity Prediabetes A1c 6.1 Resume glipizide  5 twice daily sliding scale continues CBG ranging below 180 Outpatient weight loss strategies as per PCP Smoker total 1988 with dyspnea on exertion follows with Dr. Nyra Continue further workup as per Dr. Beola component of restrictive physiology No wheeze rales rhonchi therefore stop Solu-Medrol  and seems to be stable CKD 1 Monitor  Discharge Exam: Vitals:   02/03/24 0900 02/03/24 1148  BP: (!) 164/67 (!) 145/60  Pulse: 63   Resp: 18 18  Temp: 97.7 F (36.5 C) 98.1 F (36.7 C)  SpO2: 93% 95%    Subj on day of d/c   Awake alert no distress Cta b no added sound Abd soft nt nd no rebound Power 5/5  Discharge Instructions   Discharge Instructions     Diet - low sodium heart healthy   Complete by: As directed    Discharge instructions   Complete by: As directed    You were diagnosed with a questionable stroke but we feel that is important to continue aspirin  Plavix  for total 21 days and then stop Plavix  and continue aspirin  alone going forward Please pick up your prescription for the Cardizem  from your pharmacy-this will help you suppress any SVT that you have as you do have the risk of it I would recommend that you follow-up with your primary physician in about a week or so and subsequently with neurology as per them   Increase activity slowly   Complete by: As directed       Allergies  as of 02/03/2024       Reactions   Penicillins    Rash    Ivp Dye [iodinated Contrast Media]    Hives    Percocet [oxycodone -acetaminophen ]    Itching         Medication List     STOP taking these medications    omeprazole  20 MG capsule Commonly known as: PRILOSEC       TAKE these medications    amLODipine  5 MG tablet Commonly known as: NORVASC  Take 1 tablet by mouth once daily   aspirin  EC 81 MG tablet Take 1 tablet (81 mg  total) by mouth daily. Swallow whole. Start taking on: February 04, 2024   benzonatate  200 MG capsule Commonly known as: TESSALON  Take 1 capsule (200 mg total) by mouth 3 (three) times daily as needed for cough.   clopidogrel  75 MG tablet Commonly known as: PLAVIX  Take 1 tablet (75 mg total) by mouth daily. Start taking on: February 04, 2024   diltiazem  30 MG tablet Commonly known as: CARDIZEM  Take 1 tablet (30 mg total) by mouth 3 (three) times daily as needed.   famotidine  20 MG tablet Commonly known as: Pepcid  One after supper   fenofibrate  145 MG tablet Commonly known as: TRICOR  TAKE ONE TABLET BY MOUTH ONCE A DAY   glipiZIDE  5 MG tablet Commonly known as: GLUCOTROL  TAKE 1 TABLET BY MOUTH TWICE DAILY BEFORE MEAL(S)   losartan  100 MG tablet Commonly known as: COZAAR  Take 1 tablet by mouth once daily   pantoprazole  40 MG tablet Commonly known as: Protonix  Take 1 tablet (40 mg total) by mouth daily. Take 30-60 min before first meal of the day   rosuvastatin  5 MG tablet Commonly known as: CRESTOR  Take 1 tablet (5 mg total) by mouth 3 (three) times a week.   Spikevax  injection Generic drug: COVID-19 mRNA vaccine (Moderna, >/= 15yrs) Inject 0.5 mLs into the muscle as directed.   triamcinolone cream 0.1 % Commonly known as: KENALOG Apply 1 Application topically as directed.       Allergies  Allergen Reactions   Penicillins     Rash    Ivp Dye [Iodinated Contrast Media]     Hives    Percocet [Oxycodone -Acetaminophen ]     Itching       The results of significant diagnostics from this hospitalization (including imaging, microbiology, ancillary and laboratory) are listed below for reference.    Significant Diagnostic Studies: ECHOCARDIOGRAM COMPLETE Result Date: 02/03/2024    ECHOCARDIOGRAM REPORT   Patient Name:   Jill Garza Date of Exam: 02/03/2024 Medical Rec #:  [id number]          Height:       64.0 in Accession #:    [id number]         Weight:        200.0 lb Date of Birth:  10-Dec-1950          BSA:          1.956 m Patient Age:    73 years           BP:           164/67 mmHg Patient Gender: F                  HR:           63 bpm. Exam Location:  Inpatient Procedure: 2D Echo, Cardiac Doppler, Color Doppler, Strain Analysis and  Intracardiac Opacification Agent (Both Spectral and Color Flow            Doppler were utilized during procedure). Indications:    Stroke I63.9  History:        Patient has no prior history of Echocardiogram examinations.                 Risk Factors:Hypertension, Diabetes, Dyslipidemia and Family                 History of Coronary Artery Disease. Hx of CKD and PSVT.  Sonographer:    Aida Pizza RCS Referring Phys: [phone] [facility]  Sonographer Comments: Global longitudinal strain was attempted. IMPRESSIONS  1. Left ventricular ejection fraction, by estimation, is 60 to 65%. The left ventricle has normal function. The left ventricle has no regional wall motion abnormalities.  2. Right ventricular systolic function is normal. The right ventricular size is normal. There is normal pulmonary artery systolic pressure. The estimated right ventricular systolic pressure is 18.1 mmHg.  3. Left atrial size was mildly dilated.  4. The mitral valve is normal in structure. No evidence of mitral valve regurgitation. No evidence of mitral stenosis.  5. The aortic valve is calcified. There is mild calcification of the aortic valve. There is mild thickening of the aortic valve. Aortic valve regurgitation is mild. Aortic valve sclerosis is present, with no evidence of aortic valve stenosis. Aortic valve mean gradient measures 7.0 mmHg. Aortic valve Vmax measures 1.76 m/s.  6. The inferior vena cava is normal in size with greater than 50% respiratory variability, suggesting right atrial pressure of 3 mmHg. Conclusion(s)/Recommendation(s): No intracardiac source of embolism detected on this transthoracic study. Consider a  transesophageal echocardiogram to exclude cardiac source of embolism if clinically indicated. FINDINGS  Left Ventricle: Left ventricular ejection fraction, by estimation, is 60 to 65%. The left ventricle has normal function. The left ventricle has no regional wall motion abnormalities. Definity contrast agent was given IV to delineate the left ventricular  endocardial borders. Global longitudinal strain performed but not reported based on interpreter judgement due to suboptimal tracking. The left ventricular internal cavity size was normal in size. There is no left ventricular hypertrophy. Right Ventricle: The right ventricular size is normal. No increase in right ventricular wall thickness. Right ventricular systolic function is normal. There is normal pulmonary artery systolic pressure. The tricuspid regurgitant velocity is 1.94 m/s, and  with an assumed right atrial pressure of 3 mmHg, the estimated right ventricular systolic pressure is 18.1 mmHg. Left Atrium: Left atrial size was mildly dilated. Right Atrium: Right atrial size was normal in size. Pericardium: There is no evidence of pericardial effusion. Mitral Valve: The mitral valve is normal in structure. No evidence of mitral valve regurgitation. No evidence of mitral valve stenosis. Tricuspid Valve: The tricuspid valve is normal in structure. Tricuspid valve regurgitation is trivial. No evidence of tricuspid stenosis. Aortic Valve: The aortic valve is calcified. There is mild calcification of the aortic valve. There is mild thickening of the aortic valve. Aortic valve regurgitation is mild. Aortic valve sclerosis is present, with no evidence of aortic valve stenosis. Aortic valve mean gradient measures 7.0 mmHg. Aortic valve peak gradient measures 12.4 mmHg. Aortic valve area, by VTI measures 3.09 cm. Pulmonic Valve: The pulmonic valve was normal in structure. Pulmonic valve regurgitation is not visualized. No evidence of pulmonic stenosis. Aorta: The  aortic root is normal in size and structure. Venous: The inferior vena cava is normal in size with  greater than 50% respiratory variability, suggesting right atrial pressure of 3 mmHg. IAS/Shunts: No atrial level shunt detected by color flow Doppler.  LEFT VENTRICLE PLAX 2D LVIDd:         4.50 cm   Diastology LVIDs:         3.10 cm   LV e' medial:    8.92 cm/s LV PW:         1.00 cm   LV E/e' medial:  9.3 LV IVS:        1.10 cm   LV e' lateral:   10.70 cm/s LVOT diam:     2.00 cm   LV E/e' lateral: 7.7 LV SV:         123 LV SV Index:   63        2D Longitudinal Strain LVOT Area:     3.14 cm  2D Strain GLS Avg:     -11.4 %  RIGHT VENTRICLE RV S prime:     11.90 cm/s TAPSE (M-mode): 2.6 cm LEFT ATRIUM             Index        RIGHT ATRIUM           Index LA diam:        3.50 cm 1.79 cm/m   RA Area:     16.50 cm LA Vol (A2C):   70.3 ml 35.93 ml/m  RA Volume:   38.30 ml  19.58 ml/m LA Vol (A4C):   63.5 ml 32.46 ml/m LA Biplane Vol: 69.2 ml 35.37 ml/m  AORTIC VALVE AV Area (Vmax):    3.30 cm AV Area (Vmean):   2.86 cm AV Area (VTI):     3.09 cm AV Vmax:           176.00 cm/s AV Vmean:          124.000 cm/s AV VTI:            0.396 m AV Peak Grad:      12.4 mmHg AV Mean Grad:      7.0 mmHg LVOT Vmax:         185.00 cm/s LVOT Vmean:        113.000 cm/s LVOT VTI:          0.390 m LVOT/AV VTI ratio: 0.98  AORTA Ao Root diam: 3.90 cm MITRAL VALVE               TRICUSPID VALVE MV Area (PHT): 3.65 cm    TR Peak grad:   15.1 mmHg MV Decel Time: 208 msec    TR Vmax:        194.00 cm/s MV E velocity: 82.70 cm/s MV A velocity: 96.70 cm/s  SHUNTS MV E/A ratio:  0.86        Systemic VTI:  0.39 m                            Systemic Diam: 2.00 cm Oneil Parchment MD Electronically signed by Oneil Parchment MD Signature Date/Time: 02/03/2024/1:01:46 PM    Final    MR BRAIN W WO CONTRAST Result Date: 02/02/2024 CLINICAL DATA:  Provided history: Ruling out stroke and other intracranial abnormality. EXAM: MRI HEAD WITHOUT AND WITH  CONTRAST TECHNIQUE: Multiplanar, multiecho pulse sequences of the brain and surrounding structures were obtained without and with intravenous contrast. CONTRAST:  10mL GADAVIST  GADOBUTROL  1 MMOL/ML IV SOLN COMPARISON:  Non-contrast head CT and CT  angiogram head/neck 02/01/2024. MRI brain and orbits 10/17/2020. FINDINGS: Brain: No age-advanced or lobar predominant cerebral atrophy. 4 mm focus of diffusion-weighted signal hyperintensity within the left frontal lobe centrum semiovale (series 3, image 37). There is corresponding intermediate signal at this site on the [facility] map and this is most consistent with a subacute infarct. Chronic lacunar infarct within the right corona radiata, new from the prior MRI (series 7, image 22). Moderate multifocal T2 FLAIR hyperintense signal abnormality elsewhere within the cerebral white matter, nonspecific but compatible with chronic small vessel ischemic disease. Small chronic infarcts within the bilateral cerebellar hemispheres, new from the prior MRI. Partial empty sella turcica. No evidence of an intracranial mass. No extra-axial fluid collection. No midline shift. No pathologic intracranial enhancement identified. Vascular: Maintained flow voids within the proximal large arterial vessels. Skull and upper cervical spine: No focal worrisome marrow lesion. Sinuses/Orbits: No mass or acute finding within the imaged orbits. Prior bilateral ocular lens replacement. Trace mucosal thickening within the left ethmoid and right sphenoid sinuses. Other: Trace fluid within right mastoid air cells. IMPRESSION: 1. 4 mm probable subacute infarct within the left frontal lobe white matter. 2. 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 3. Background moderate cerebral white matter chronic small vessel ischemic disease. 4. Trace right mastoid effusion. Electronically Signed   By: Rockey Childs D.O.   On:  02/02/2024 11:27   CT ANGIO HEAD NECK W WO CM Result Date: 02/01/2024 CLINICAL DATA:  Initial evaluation for acute stroke/TIA. EXAM: CT ANGIOGRAPHY HEAD AND NECK WITH AND WITHOUT CONTRAST TECHNIQUE: Multidetector CT imaging of the head and neck was performed using the standard protocol during bolus administration of intravenous contrast. Multiplanar CT image reconstructions and MIPs were obtained to evaluate the vascular anatomy. Carotid stenosis measurements (when applicable) are obtained utilizing NASCET criteria, using the distal internal carotid diameter as the denominator. RADIATION DOSE REDUCTION: This exam was performed according to the departmental dose-optimization program which includes automated exposure control, adjustment of the mA and/or kV according to patient size and/or use of iterative reconstruction technique. CONTRAST:  60mL OMNIPAQUE  IOHEXOL  350 MG/ML SOLN COMPARISON:  CT from earlier the same day. FINDINGS: CTA NECK FINDINGS Aortic arch: Visualized arch within normal limits for caliber. Mild aortic atherosclerosis. Bovine branching pattern. No stenosis about the origin the great vessels. Right carotid system: Right common and internal carotid arteries are tortuous but patent without stenosis or dissection. Left carotid system: Left common and internal carotid arteries are tortuous but patent without stenosis or dissection. Vertebral arteries: Left vertebral artery dominant. No stenosis or dissection. Skeleton: No worrisome osseous lesions. Other neck: No other acute finding. Upper chest: No other acute finding. Review of the MIP images confirms the above findings CTA HEAD FINDINGS Anterior circulation: Both internal carotid arteries widely patent to the termini without stenosis. A1 segments widely patent. Normal anterior communicating artery complex. Both anterior cerebral arteries widely patent to their distal aspects without stenosis. No M1 stenosis or occlusion. Normal MCA bifurcations.  Distal MCA branches well perfused and symmetric. Posterior circulation: Both V4 segments patent without stenosis. Left vertebral artery dominant. Left PICA patent. Right PICA not seen. Basilar patent without stenosis. Superior cerebellar and posterior cerebral arteries patent bilaterally. Venous sinuses: Patent allowing for timing the contrast bolus. Anatomic variants: As above.  No aneurysm. Review of the MIP images confirms the above findings IMPRESSION: 1. Negative CTA of the head and neck. No large vessel  occlusion or other emergent finding. No hemodynamically significant or correctable stenosis. 2. Diffuse tortuosity of the major arterial vasculature of the head and neck, suggesting chronic underlying hypertension. Aortic Atherosclerosis (ICD10-I70.0). Electronically Signed   By: Morene Hoard M.D.   On: 02/01/2024 21:47   CT Head Wo Contrast Result Date: 02/01/2024 CLINICAL DATA:  Neuro deficit, acute, stroke suspected dizziness EXAM: CT HEAD WITHOUT CONTRAST TECHNIQUE: Contiguous axial images were obtained from the base of the skull through the vertex without intravenous contrast. RADIATION DOSE REDUCTION: This exam was performed according to the departmental dose-optimization program which includes automated exposure control, adjustment of the mA and/or kV according to patient size and/or use of iterative reconstruction technique. COMPARISON:  MRI head October 17, 2020. FINDINGS: Brain: No evidence of acute infarction, hemorrhage, hydrocephalus, extra-axial collection or mass lesion/mass effect. Patchy white matter hypodensities are nonspecific but compatible with chronic microvascular ischemic change. Small remote appearing cerebellar infarcts. Vascular: No hyperdense vessel identified. Skull: No acute fracture. Sinuses/Orbits: Clear sinuses.  No acute orbital findings. IMPRESSION: 1. No evidence of acute intracranial abnormality. 2. Small remote appearing cerebellar infarcts and chronic  microvascular ischemic disease. Electronically Signed   By: Gilmore GORMAN Molt M.D.   On: 02/01/2024 14:09    Microbiology: No results found for this or any previous visit (from the past 240 hours).   Labs: Basic Metabolic Panel: Recent Labs  Lab 02/01/24 1329 02/02/24 0543  NA 139 137  K 4.3 4.2  CL 104 104  CO2 21* 22  GLUCOSE 153* 120*  BUN 23 20  CREATININE 1.35* 1.28*  CALCIUM  10.5* 9.5   Liver Function Tests: Recent Labs  Lab 02/01/24 1329 02/02/24 0543  AST 23 20  ALT 15 17  ALKPHOS 43 31*  BILITOT 0.5 0.7  PROT 7.6 6.9  ALBUMIN 4.4 3.8   No results for input(s): LIPASE, AMYLASE in the last 168 hours. No results for input(s): AMMONIA in the last 168 hours. CBC: Recent Labs  Lab 02/01/24 1329 02/02/24 0543  WBC 6.7 7.4  NEUTROABS 4.3  --   HGB 13.4 13.0  HCT 41.2 39.7  MCV 87.1 86.9  PLT 300 298   Cardiac Enzymes: No results for input(s): CKTOTAL, CKMB, CKMBINDEX, TROPONINI in the last 168 hours. BNP: BNP (last 3 results) Recent Labs    10/18/23 1049  BNP 97    ProBNP (last 3 results) No results for input(s): PROBNP in the last 8760 hours.  CBG: Recent Labs  Lab 02/02/24 0744 02/02/24 1242 02/02/24 1549 02/02/24 2116 02/03/24 1137  GLUCAP 102* 85 92 105* 172*    Signed:  Colen Grimes MD   [facility] 02/03/2024, 2:13 PM

## 2024-02-03 NOTE — Progress Notes (Signed)
*  PRELIMINARY RESULTS* Echocardiogram 2D Echocardiogram has been performed with Definity.  Jill Garza 02/03/2024, 12:58 PM

## 2024-02-05 ENCOUNTER — Telehealth: Payer: Self-pay

## 2024-02-05 NOTE — Transitions of Care (Post Inpatient/ED Visit) (Signed)
 02/05/2024  Name: Jill Garza [facility] MRN: [id number] DOB: [birth year]  Today's TOC FU Call Status: Today's TOC FU Call Status:: Successful TOC FU Call Completed TOC FU Call Complete Date: 02/05/24 Patient's Name and Date of Birth confirmed.  Transition Care Management Follow-up Telephone Call Date of Discharge: 02/03/24 Discharge Facility: [facility]) Type of Discharge: Inpatient Admission Primary Inpatient Discharge Diagnosis:: TIA/ CVA How have you been since you were released from the hospital?: Better (not as tired.) Any questions or concerns?: No  Items Reviewed: Did you receive and understand the discharge instructions provided?: Yes Medications obtained,verified, and reconciled?: Yes (Medications Reviewed) Any new allergies since your discharge?: No Dietary orders reviewed?: Yes Type of Diet Ordered:: low salt, heart healhty diet Do you have support at home?: Yes People in Home [RPT]: spouse Name of Support/Comfort Primary Source: husband  Medications Reviewed Today: Medications Reviewed Today     Reviewed by Rumalda Alan PENNER, RN (Registered Nurse) on 02/05/24 at 1512  Med List Status: <None>   Medication Order Taking? Sig Documenting Provider Last Dose Status Informant  amLODipine  (NORVASC ) 5 MG tablet [id number] Yes Take 1 tablet by mouth once daily Eubanks, Jessica K, NP  Active Self  aspirin  EC 81 MG tablet [id number] Yes Take 1 tablet (81 mg total) by mouth daily. Swallow whole. Samtani, Jai-Gurmukh, MD  Active   benzonatate  (TESSALON ) 200 MG capsule [id number] Yes Take 1 capsule (200 mg total) by mouth 3 (three) times daily as needed for cough. Darlean Ozell NOVAK, MD  Active Self  clopidogrel  (PLAVIX ) 75 MG tablet [id number] Yes Take 1 tablet (75 mg total) by mouth daily. Samtani, Jai-Gurmukh, MD  Active   COVID-19 mRNA vaccine, Moderna, >/= 33yrs, (SPIKEVAX ) injection [id number]  Inject 0.5 mLs into the muscle as directed. [name], NP  Active Self  diltiazem   (CARDIZEM ) 30 MG tablet [id number] Yes Take 1 tablet (30 mg total) by mouth 3 (three) times daily as needed. Samtani, Jai-Gurmukh, MD  Active   famotidine  (PEPCID ) 20 MG tablet [id number] Yes One after supper Darlean Ozell NOVAK, MD  Active Self  fenofibrate  (TRICOR ) 145 MG tablet [id number] Yes TAKE ONE TABLET BY MOUTH ONCE A DAY Eubanks, Jessica K, NP  Active Self  glipiZIDE  (GLUCOTROL ) 5 MG tablet [id number] Yes TAKE 1 TABLET BY MOUTH TWICE DAILY BEFORE MEAL(S) Eubanks, Jessica K, NP  Active Self  losartan  (COZAAR ) 100 MG tablet [id number] Yes Take 1 tablet by mouth once daily Eubanks, Jessica K, NP  Active Self  pantoprazole  (PROTONIX ) 40 MG tablet [id number]  Take 1 tablet (40 mg total) by mouth daily. Take 30-60 min before first meal of the day  Patient not taking: Reported on 02/05/2024   Samtani, Jai-Gurmukh, MD  Active   rosuvastatin  (CRESTOR ) 5 MG tablet [id number] Yes Take 1 tablet (5 mg total) by mouth 3 (three) times a week. [name], NP  Active Self  triamcinolone cream (KENALOG) 0.1 % [id number] Yes Apply 1 Application topically as directed. [provider]  Active Self            Home Care and Equipment/Supplies: Were Home Health Services Ordered?: No Any new equipment or medical supplies ordered?: No  Functional Questionnaire: Do you need assistance with bathing/showering or dressing?: No Do you need assistance with meal preparation?: No Do you need assistance with eating?: No Do you have difficulty maintaining continence: No Do you need assistance with getting out of bed/getting out of a chair/moving?: No Do you have difficulty managing or  taking your medications?: No  Follow up appointments reviewed: PCP Follow-up appointment confirmed?: No (will call today and make an appointment) MD Provider Line Number:[phone] Given: No Specialist Hospital Follow-up appointment confirmed?: No Reason Specialist Follow-Up Not Confirmed: Patient has Specialist Provider  Number and will Call for Appointment Do you need transportation to your follow-up appointment?: No Do you understand care options if your condition(s) worsen?: Yes-patient verbalized understanding  SDOH Interventions Today    Flowsheet Row Most Recent Value  SDOH Interventions   Food Insecurity Interventions Intervention Not Indicated  Housing Interventions Intervention Not Indicated  Transportation Interventions Intervention Not Indicated  Utilities Interventions Intervention Not Indicated   Patient reports that she is doing really well. Denies any deficits.  It is unsure at this time diagnosis.  Reports that she is taking her medications as prescribed.  Reports she is generally in good healthy.  Recent episodes of SVT is working with cardiology to address these.   Interventions: reviewed all medications and patient is taking her medications as prescribed.  Reviewed diet. Reviewed importance of follow up with PCP, neurology and cardiology. Patient will call today and make appointments.  Reviewed importance of calling 911 for any dizziness, chest pain, shortness of breath. Reviewed S/s of stroke.   Reviewed and offered 30 day TOC program and patient declined.  Alan Ee, RN, BSN, CEN [employer]- Transition of Care Team.  [facility] [phone]

## 2024-02-06 ENCOUNTER — Encounter (HOSPITAL_COMMUNITY): Payer: Self-pay

## 2024-02-19 ENCOUNTER — Encounter: Payer: Self-pay | Admitting: Family

## 2024-02-19 ENCOUNTER — Ambulatory Visit: Admitting: Family

## 2024-02-19 VITALS — BP 134/68 | HR 73 | Temp 97.7°F | Resp 20 | Ht 64.0 in | Wt 208.6 lb

## 2024-02-19 DIAGNOSIS — I10 Essential (primary) hypertension: Secondary | ICD-10-CM | POA: Diagnosis not present

## 2024-02-19 DIAGNOSIS — I351 Nonrheumatic aortic (valve) insufficiency: Secondary | ICD-10-CM

## 2024-02-19 DIAGNOSIS — E871 Hypo-osmolality and hyponatremia: Secondary | ICD-10-CM

## 2024-02-19 DIAGNOSIS — N1832 Chronic kidney disease, stage 3b: Secondary | ICD-10-CM

## 2024-02-19 DIAGNOSIS — E785 Hyperlipidemia, unspecified: Secondary | ICD-10-CM

## 2024-02-19 DIAGNOSIS — Z8673 Personal history of transient ischemic attack (TIA), and cerebral infarction without residual deficits: Secondary | ICD-10-CM

## 2024-02-19 DIAGNOSIS — E1140 Type 2 diabetes mellitus with diabetic neuropathy, unspecified: Secondary | ICD-10-CM

## 2024-02-19 NOTE — Progress Notes (Signed)
 Provider: Moranda Billiot FNP-C  [name], NP  Patient Care Team: [name], NP as PCP - General (Geriatric Medicine) Lavona Agent, MD as PCP - Cardiology (Cardiology) Regenia Prentice Clack, MD as Consulting Physician (Ophthalmology) Gearline Norris, MD as Consulting Physician (Nephrology) [facility], [facility]  Extended Emergency Contact Information Primary Emergency Contact: [facility] Address: [address] [place], [place] [address] [place] Mobile Phone: [phone] Relation: Spouse Secondary Emergency Contact: Silverio,JOANNE Home Phone: [phone] Relation: Daughter  Code Status:  Full Code  Goals of care: Advanced Directive information    02/19/2024    2:30 PM  Advanced Directives  Does Patient Have a Medical Advance Directive? Yes  Type of Estate agent of [place];Out of facility DNR (pink MOST or yellow form)  Does patient want to make changes to medical advance directive? No - Patient declined  Copy of Healthcare Power of Attorney in Chart? Yes - validated most recent copy scanned in chart (See row information)     Chief Complaint  Patient presents with   Transitions [facility] follow up.    Discussed the use of AI scribe software for clinical note transcription with the patient, who gave verbal consent to proceed.  History of Present Illness   Nesa Distel is a 73 year old female who presents for follow-up after a recent hospital admission for dizziness and nausea.  She was admitted to the hospital on September 25th and discharged on September 27th due to severe dizziness and nausea. The dizziness was described as 'real dizziness' that made her feel like she was going to fall, necessitating her to lay down on the floor for about fifteen to twenty minutes. She also experienced significant nausea, stating it was difficult to keep her food down, although she did not  vomit.  During her hospital stay, her sodium level was noted to be slightly low at 139, while her potassium, kidney function, white blood cell count, and hemoglobin were within normal limits. A CT scan showed no acute abnormalities but revealed remote cerebral infarcts. A CT angiogram was negative for major arterial vascular issues. An echocardiogram showed an ejection fraction of 60-65% with normal valves, but mild aortic valve regurgitation was noted.  She is currently on several medications including diltiazem , amlodipine  5 mg for blood pressure, baby aspirin , Plavix  (which she will complete on Friday), glipizide  5 mg twice a day for diabetes, losartan  100 mg daily for blood pressure, pantoprazole  40 mg daily, rosuvastatin  5 mg three times a week, and fenofibrate  145 mg. She also uses triamcinolone cream as needed for skin issues and Tessalon  perles for cough as needed.  She quit smoking in 1988 after smoking for ten years. She recently returned from a vacation in [place] , where she noted some weight gain but mentioned she had previously lost weight. She only drinks alcohol occasionally, typically during vacations.  No current issues with bleeding and her cough has improved. She has not experienced any bowel movements today but denies any abdominal pain or tenderness.   Past Medical History:  Diagnosis Date   Arthritis    Cancer (HCC)    melanoma- right leg   Chronic kidney disease    Depression    GERD (gastroesophageal reflux disease)    pt denies    History of hiatal hernia    Hypercholesterolemia    Hyperparathyroidism    Hypertension    Sweat,  sweating, excessive    Type 2 diabetes mellitus (HCC)    Past Surgical History:  Procedure Laterality Date   ABDOMINAL HYSTERECTOMY  1979   APPENDECTOMY  1964   BREAST EXCISIONAL BIOPSY Left 08/2014   benign   BREAST LUMPECTOMY WITH RADIOACTIVE SEED LOCALIZATION Left 12/31/2014   Procedure: LEFT BREAST LUMPECTOMY WITH RADIOACTIVE SEED  LOCALIZATION;  Surgeon: Donnice Lima, MD;  Location: [facility];  Service: General;  Laterality: Left;   CARPAL TUNNEL RELEASE Right 1977   CARPAL TUNNEL RELEASE Left 2007   CATARACT EXTRACTION Left 04/13/2014   Dr. Cleatus   CATARACT EXTRACTION Right 06/10/2014   Dr. Cleatus   CHOLECYSTECTOMY N/A 07/14/2017   Procedure: LAPAROSCOPIC CHOLECYSTECTOMY;  Surgeon: Teresa Lonni HERO, MD;  Location: [facility];  Service: General;  Laterality: N/A;   COLONOSCOPY  2009   JOINT REPLACEMENT     MELANOMA EXCISION     PARATHYROIDECTOMY N/A 09/10/2020   Procedure: PARATHYROIDECTOMY;  Surgeon: Eletha Boas, MD;  Location: [facility];  Service: General;  Laterality: N/A;   THYROID  EXPLORATION N/A 09/10/2020   Procedure: POSSIBLE NECK EXPLORATION;  Surgeon: Eletha Boas, MD;  Location: [facility];  Service: General;  Laterality: N/A;   TONSILLECTOMY  1962   TOTAL KNEE ARTHROPLASTY Left 2012   TOTAL KNEE ARTHROPLASTY Right 2008    Allergies  Allergen Reactions   Penicillins     Rash    Ivp Dye [Iodinated Contrast Media]     Hives    Percocet [Oxycodone -Acetaminophen ]     Itching     Outpatient Encounter Medications as of 02/19/2024  Medication Sig   amLODipine  (NORVASC ) 5 MG tablet Take 1 tablet by mouth once daily   aspirin  EC 81 MG tablet Take 1 tablet (81 mg total) by mouth daily. Swallow whole.   benzonatate  (TESSALON ) 200 MG capsule Take 1 capsule (200 mg total) by mouth 3 (three) times daily as needed for cough.   clopidogrel  (PLAVIX ) 75 MG tablet Take 1 tablet (75 mg total) by mouth daily.   diltiazem  (CARDIZEM ) 30 MG tablet Take 1 tablet (30 mg total) by mouth 3 (three) times daily as needed.   fenofibrate  (TRICOR ) 145 MG tablet TAKE ONE TABLET BY MOUTH ONCE A DAY   glipiZIDE  (GLUCOTROL ) 5 MG tablet TAKE 1 TABLET BY MOUTH TWICE DAILY BEFORE MEAL(S)   losartan  (COZAAR ) 100 MG tablet Take 1 tablet by mouth once daily   pantoprazole  (PROTONIX ) 40 MG tablet Take 1 tablet (40 mg total)  by mouth daily. Take 30-60 min before first meal of the day   rosuvastatin  (CRESTOR ) 5 MG tablet Take 1 tablet (5 mg total) by mouth 3 (three) times a week.   COVID-19 mRNA vaccine, Moderna, >/= 2yrs, (SPIKEVAX ) injection Inject 0.5 mLs into the muscle as directed. (Patient not taking: Reported on 02/19/2024)   triamcinolone cream (KENALOG) 0.1 % Apply 1 Application topically as directed.   [DISCONTINUED] famotidine  (PEPCID ) 20 MG tablet One after supper (Patient not taking: Reported on 02/19/2024)   No facility-administered encounter medications on file as of 02/19/2024.    Review of Systems  Constitutional:  Negative for appetite change, chills, fatigue, fever and unexpected weight change.  HENT:  Negative for congestion, ear discharge, ear pain, facial swelling, hearing loss, nosebleeds, postnasal drip, rhinorrhea, sinus pressure, sinus pain, sneezing, sore throat, tinnitus and trouble swallowing.   Eyes:  Negative for pain, discharge, redness, itching and visual disturbance.  Respiratory:  Negative for cough, chest tightness, shortness of breath and wheezing.  Cardiovascular:  Negative for chest pain, palpitations and leg swelling.  Gastrointestinal:  Negative for abdominal distention, abdominal pain, constipation, diarrhea, nausea and vomiting.  Endocrine: Negative for cold intolerance, heat intolerance, polydipsia, polyphagia and polyuria.  Genitourinary:  Negative for difficulty urinating, dysuria, flank pain, frequency and urgency.  Musculoskeletal:  Negative for arthralgias, back pain, gait problem, joint swelling, myalgias, neck pain and neck stiffness.  Skin:  Negative for color change, pallor, rash and wound.  Neurological:  Negative for syncope, speech difficulty, weakness, light-headedness and numbness.       Dizziness resolved   Hematological:  Does not bruise/bleed easily.  Psychiatric/Behavioral:  Negative for agitation, behavioral problems, confusion, hallucinations and  sleep disturbance. The patient is not nervous/anxious.     Immunization History  Administered Date(s) Administered   Fluad Quad(high Dose 65+) 02/05/2019, 03/06/2020, 01/29/2021, 02/11/2022   Fluad Trivalent(High Dose 65+) 02/17/2023   INFLUENZA, HIGH DOSE SEASONAL PF 02/21/2018, 02/02/2024   Influenza Whole 01/26/2013   Influenza-Unspecified 01/28/2014, 02/18/2016, 03/01/2017   PFIZER(Purple Top)SARS-COV-2 Vaccination 05/31/2019, 06/21/2019, 02/04/2020, 08/07/2020   Pfizer Covid-19 Vaccine Bivalent Booster 27yrs & up 02/06/2021, 01/27/2022   Pneumococcal Conjugate-13 12/17/2013   Pneumococcal Polysaccharide-23 04/09/2013, 04/18/2018   Tdap 08/17/2009, 10/24/2019   Unspecified SARS-COV-2 Vaccination 01/28/2022   Zoster Recombinant(Shingrix) 04/18/2018, 06/25/2018   Zoster, Live 01/09/2011   Pertinent  Health Maintenance Due  Topic Date Due   FOOT EXAM  02/17/2024   OPHTHALMOLOGY EXAM  02/27/2024   HEMOGLOBIN A1C  08/01/2024   Colonoscopy  06/16/2025   Influenza Vaccine  Completed   DEXA SCAN  Completed   Mammogram  Discontinued      06/13/2023   11:00 AM 10/18/2023    9:45 AM 12/15/2023    9:42 AM 02/05/2024    3:17 PM 02/19/2024    2:29 PM  Fall Risk  Falls in the past year? 0 0 0 1 0  Was there an injury with Fall? 0 0 0 0 0  Fall Risk Category Calculator 0 0 0 1 0  Patient at Risk for Falls Due to  No Fall Risks No Fall Risks  No Fall Risks  Fall risk Follow up  Falls evaluation completed Falls evaluation completed  Falls evaluation completed   Functional Status Survey:    Vitals:   02/19/24 1433  BP: 134/68  Pulse: 73  Resp: 20  Temp: 97.7 F (36.5 C)  SpO2: 97%  Weight: 208 lb 9.6 oz (94.6 kg)  Height: 5' 4 (1.626 m)   Body mass index is 35.81 kg/m. Physical Exam VITALS: T- 97.7, P- 73, BP- 134/68, SaO2- 97% MEASUREMENTS: Weight- 208.6. GENERAL: Alert, cooperative, well developed, no acute distress. HEENT: Normocephalic, moist mucous membranes. CHEST:  Clear to auscultation bilaterally, no wheezes, rhonchi, or crackles. CARDIOVASCULAR: Normal heart rate and rhythm, S1 and S2 normal with murmur. ABDOMEN: Soft, non-tender, non-distended, without organomegaly, normal bowel sounds. EXTREMITIES: Mild non-pitting edema, no cyanosis. NEUROLOGICAL: Cranial nerves grossly intact, moves all extremities without gross motor or sensory deficit.  PSYCHIATRY/BEHAVIORAL: Mood stable    Labs reviewed: Recent Labs    08/15/23 0811 09/05/23 0000 02/01/24 1329 02/02/24 0543  NA 138 142 139 137  K 4.7 3.9 4.3 4.2  CL 101 105 104 104  CO2 29 26* 21* 22  GLUCOSE 146*  --  153* 120*  BUN 25 24* 23 20  CREATININE 1.61* 1.3* 1.35* 1.28*  CALCIUM  10.1 9.6 10.5* 9.5   Recent Labs    08/15/23 0811 09/05/23 0000 02/01/24 1329 02/02/24  0543  AST 17  --  23 20  ALT 16  --  15 17  ALKPHOS  --   --  43 31*  BILITOT 0.4  --  0.5 0.7  PROT 7.1  --  7.6 6.9  ALBUMIN  --  4.3 4.4 3.8   Recent Labs    08/15/23 0811 02/01/24 1329 02/02/24 0543  WBC 5.7 6.7 7.4  NEUTROABS 3,021 4.3  --   HGB 13.9 13.4 13.0  HCT 42.0 41.2 39.7  MCV 85.5 87.1 86.9  PLT 287 300 298   Lab Results  Component Value Date   TSH 1.92 08/15/2023   Lab Results  Component Value Date   HGBA1C 6.1 (H) 02/02/2024   Lab Results  Component Value Date   CHOL 105 02/02/2024   HDL 43 02/02/2024   LDLCALC 51 02/02/2024   TRIG 56 02/02/2024   CHOLHDL 2.4 02/02/2024    Significant Diagnostic Results in last 30 days:  EEG adult Result Date: 02/03/2024 Shelton Arlin KIDD, MD     02/03/2024  3:47 PM Patient Name: Karlina Suares MRN: [id number] Epilepsy Attending: Arlin KIDD Shelton Referring Physician/Provider: Jerri Pfeiffer, MD Date: 02/03/2024 Duration: 22.26 mins Patient history: 73 y.o. female with history of hypertension, hyperlipidemia, diabetes and GERD admitted for an acute episode of dizziness and diaphoresis.  EEG to evaluate for seizure Level of alertness: Awake AEDs  during EEG study: None Technical aspects: This EEG study was done with scalp electrodes positioned according to the 10-20 International system of electrode placement. Electrical activity was reviewed with band pass filter of 1-70Hz , sensitivity of 7 uV/mm, display speed of 42mm/sec with a 60Hz  notched filter applied as appropriate. EEG data were recorded continuously and digitally stored.  Video monitoring was available and reviewed as appropriate. Description: The posterior dominant rhythm consists of 9Hz  activity of moderate voltage (25-35 uV) seen predominantly in posterior head regions, symmetric and reactive to eye opening and eye closing. Physiologic photic driving was seen during photic stimulation.  Hyperventilation was not performed.   IMPRESSION: This study is within normal limits. No seizures or epileptiform discharges were seen throughout the recording. A normal interictal EEG does not exclude the diagnosis of epilepsy. Arlin KIDD Shelton   ECHOCARDIOGRAM COMPLETE Result Date: 02/03/2024    ECHOCARDIOGRAM REPORT   Patient Name:   SHAYE ELLING Date of Exam: 02/03/2024 Medical Rec #:  [id number]          Height:       64.0 in Accession #:    [id number]         Weight:       200.0 lb Date of Birth:  [birth year]          BSA:          1.956 m Patient Age:    73 years           BP:           164/67 mmHg Patient Gender: F                  HR:           63 bpm. Exam Location:  Inpatient Procedure: 2D Echo, Cardiac Doppler, Color Doppler, Strain Analysis and            Intracardiac Opacification Agent (Both Spectral and Color Flow            Doppler were utilized during procedure). Indications:    Stroke I63.9  History:  Patient has no prior history of Echocardiogram examinations.                 Risk Factors:Hypertension, Diabetes, Dyslipidemia and Family                 History of Coronary Artery Disease. Hx of CKD and PSVT.  Sonographer:    Aida Pizza RCS Referring Phys: [phone] [facility]   Sonographer Comments: Global longitudinal strain was attempted. IMPRESSIONS  1. Left ventricular ejection fraction, by estimation, is 60 to 65%. The left ventricle has normal function. The left ventricle has no regional wall motion abnormalities.  2. Right ventricular systolic function is normal. The right ventricular size is normal. There is normal pulmonary artery systolic pressure. The estimated right ventricular systolic pressure is 18.1 mmHg.  3. Left atrial size was mildly dilated.  4. The mitral valve is normal in structure. No evidence of mitral valve regurgitation. No evidence of mitral stenosis.  5. The aortic valve is calcified. There is mild calcification of the aortic valve. There is mild thickening of the aortic valve. Aortic valve regurgitation is mild. Aortic valve sclerosis is present, with no evidence of aortic valve stenosis. Aortic valve mean gradient measures 7.0 mmHg. Aortic valve Vmax measures 1.76 m/s.  6. The inferior vena cava is normal in size with greater than 50% respiratory variability, suggesting right atrial pressure of 3 mmHg. Conclusion(s)/Recommendation(s): No intracardiac source of embolism detected on this transthoracic study. Consider a transesophageal echocardiogram to exclude cardiac source of embolism if clinically indicated. FINDINGS  Left Ventricle: Left ventricular ejection fraction, by estimation, is 60 to 65%. The left ventricle has normal function. The left ventricle has no regional wall motion abnormalities. Definity contrast agent was given IV to delineate the left ventricular  endocardial borders. Global longitudinal strain performed but not reported based on interpreter judgement due to suboptimal tracking. The left ventricular internal cavity size was normal in size. There is no left ventricular hypertrophy. Right Ventricle: The right ventricular size is normal. No increase in right ventricular wall thickness. Right ventricular systolic function is normal. There is  normal pulmonary artery systolic pressure. The tricuspid regurgitant velocity is 1.94 m/s, and  with an assumed right atrial pressure of 3 mmHg, the estimated right ventricular systolic pressure is 18.1 mmHg. Left Atrium: Left atrial size was mildly dilated. Right Atrium: Right atrial size was normal in size. Pericardium: There is no evidence of pericardial effusion. Mitral Valve: The mitral valve is normal in structure. No evidence of mitral valve regurgitation. No evidence of mitral valve stenosis. Tricuspid Valve: The tricuspid valve is normal in structure. Tricuspid valve regurgitation is trivial. No evidence of tricuspid stenosis. Aortic Valve: The aortic valve is calcified. There is mild calcification of the aortic valve. There is mild thickening of the aortic valve. Aortic valve regurgitation is mild. Aortic valve sclerosis is present, with no evidence of aortic valve stenosis. Aortic valve mean gradient measures 7.0 mmHg. Aortic valve peak gradient measures 12.4 mmHg. Aortic valve area, by VTI measures 3.09 cm. Pulmonic Valve: The pulmonic valve was normal in structure. Pulmonic valve regurgitation is not visualized. No evidence of pulmonic stenosis. Aorta: The aortic root is normal in size and structure. Venous: The inferior vena cava is normal in size with greater than 50% respiratory variability, suggesting right atrial pressure of 3 mmHg. IAS/Shunts: No atrial level shunt detected by color flow Doppler.  LEFT VENTRICLE PLAX 2D LVIDd:         4.50 cm  Diastology LVIDs:         3.10 cm   LV e' medial:    8.92 cm/s LV PW:         1.00 cm   LV E/e' medial:  9.3 LV IVS:        1.10 cm   LV e' lateral:   10.70 cm/s LVOT diam:     2.00 cm   LV E/e' lateral: 7.7 LV SV:         123 LV SV Index:   63        2D Longitudinal Strain LVOT Area:     3.14 cm  2D Strain GLS Avg:     -11.4 %  RIGHT VENTRICLE RV S prime:     11.90 cm/s TAPSE (M-mode): 2.6 cm LEFT ATRIUM             Index        RIGHT ATRIUM            Index LA diam:        3.50 cm 1.79 cm/m   RA Area:     16.50 cm LA Vol (A2C):   70.3 ml 35.93 ml/m  RA Volume:   38.30 ml  19.58 ml/m LA Vol (A4C):   63.5 ml 32.46 ml/m LA Biplane Vol: 69.2 ml 35.37 ml/m  AORTIC VALVE AV Area (Vmax):    3.30 cm AV Area (Vmean):   2.86 cm AV Area (VTI):     3.09 cm AV Vmax:           176.00 cm/s AV Vmean:          124.000 cm/s AV VTI:            0.396 m AV Peak Grad:      12.4 mmHg AV Mean Grad:      7.0 mmHg LVOT Vmax:         185.00 cm/s LVOT Vmean:        113.000 cm/s LVOT VTI:          0.390 m LVOT/AV VTI ratio: 0.98  AORTA Ao Root diam: 3.90 cm MITRAL VALVE               TRICUSPID VALVE MV Area (PHT): 3.65 cm    TR Peak grad:   15.1 mmHg MV Decel Time: 208 msec    TR Vmax:        194.00 cm/s MV E velocity: 82.70 cm/s MV A velocity: 96.70 cm/s  SHUNTS MV E/A ratio:  0.86        Systemic VTI:  0.39 m                            Systemic Diam: 2.00 cm Oneil Parchment MD Electronically signed by Oneil Parchment MD Signature Date/Time: 02/03/2024/1:01:46 PM    Final    MR BRAIN W WO CONTRAST Result Date: 02/02/2024 CLINICAL DATA:  Provided history: Ruling out stroke and other intracranial abnormality. EXAM: MRI HEAD WITHOUT AND WITH CONTRAST TECHNIQUE: Multiplanar, multiecho pulse sequences of the brain and surrounding structures were obtained without and with intravenous contrast. CONTRAST:  10mL GADAVIST  GADOBUTROL  1 MMOL/ML IV SOLN COMPARISON:  Non-contrast head CT and CT angiogram head/neck 02/01/2024. MRI brain and orbits 10/17/2020. FINDINGS: Brain: No age-advanced or lobar predominant cerebral atrophy. 4 mm focus of diffusion-weighted signal hyperintensity within the left frontal lobe centrum semiovale (series 3, image 37). There is corresponding intermediate signal  at this site on the [facility] map and this is most consistent with a subacute infarct. Chronic lacunar infarct within the right corona radiata, new from the prior MRI (series 7, image 22). Moderate multifocal T2 FLAIR  hyperintense signal abnormality elsewhere within the cerebral white matter, nonspecific but compatible with chronic small vessel ischemic disease. Small chronic infarcts within the bilateral cerebellar hemispheres, new from the prior MRI. Partial empty sella turcica. No evidence of an intracranial mass. No extra-axial fluid collection. No midline shift. No pathologic intracranial enhancement identified. Vascular: Maintained flow voids within the proximal large arterial vessels. Skull and upper cervical spine: No focal worrisome marrow lesion. Sinuses/Orbits: No mass or acute finding within the imaged orbits. Prior bilateral ocular lens replacement. Trace mucosal thickening within the left ethmoid and right sphenoid sinuses. Other: Trace fluid within right mastoid air cells. IMPRESSION: 1. 4 mm probable subacute infarct within the left frontal lobe white matter. 2. 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 3. Background moderate cerebral white matter chronic small vessel ischemic disease. 4. Trace right mastoid effusion. Electronically Signed   By: Rockey Childs D.O.   On: 02/02/2024 11:27   CT ANGIO HEAD NECK W WO CM Result Date: 02/01/2024 CLINICAL DATA:  Initial evaluation for acute stroke/TIA. EXAM: CT ANGIOGRAPHY HEAD AND NECK WITH AND WITHOUT CONTRAST TECHNIQUE: Multidetector CT imaging of the head and neck was performed using the standard protocol during bolus administration of intravenous contrast. Multiplanar CT image reconstructions and MIPs were obtained to evaluate the vascular anatomy. Carotid stenosis measurements (when applicable) are obtained utilizing NASCET criteria, using the distal internal carotid diameter as the denominator. RADIATION DOSE REDUCTION: This exam was performed according to the departmental dose-optimization program which includes automated exposure control, adjustment of the mA and/or kV  according to patient size and/or use of iterative reconstruction technique. CONTRAST:  60mL OMNIPAQUE  IOHEXOL  350 MG/ML SOLN COMPARISON:  CT from earlier the same day. FINDINGS: CTA NECK FINDINGS Aortic arch: Visualized arch within normal limits for caliber. Mild aortic atherosclerosis. Bovine branching pattern. No stenosis about the origin the great vessels. Right carotid system: Right common and internal carotid arteries are tortuous but patent without stenosis or dissection. Left carotid system: Left common and internal carotid arteries are tortuous but patent without stenosis or dissection. Vertebral arteries: Left vertebral artery dominant. No stenosis or dissection. Skeleton: No worrisome osseous lesions. Other neck: No other acute finding. Upper chest: No other acute finding. Review of the MIP images confirms the above findings CTA HEAD FINDINGS Anterior circulation: Both internal carotid arteries widely patent to the termini without stenosis. A1 segments widely patent. Normal anterior communicating artery complex. Both anterior cerebral arteries widely patent to their distal aspects without stenosis. No M1 stenosis or occlusion. Normal MCA bifurcations. Distal MCA branches well perfused and symmetric. Posterior circulation: Both V4 segments patent without stenosis. Left vertebral artery dominant. Left PICA patent. Right PICA not seen. Basilar patent without stenosis. Superior cerebellar and posterior cerebral arteries patent bilaterally. Venous sinuses: Patent allowing for timing the contrast bolus. Anatomic variants: As above.  No aneurysm. Review of the MIP images confirms the above findings IMPRESSION: 1. Negative CTA of the head and neck. No large vessel occlusion or other emergent finding. No hemodynamically significant or correctable stenosis. 2. Diffuse tortuosity of the major arterial vasculature of the head and neck, suggesting chronic underlying hypertension. Aortic Atherosclerosis (ICD10-I70.0).  Electronically Signed   By: Morene Hoard  M.D.   On: 02/01/2024 21:47   CT Head Wo Contrast Result Date: 02/01/2024 CLINICAL DATA:  Neuro deficit, acute, stroke suspected dizziness EXAM: CT HEAD WITHOUT CONTRAST TECHNIQUE: Contiguous axial images were obtained from the base of the skull through the vertex without intravenous contrast. RADIATION DOSE REDUCTION: This exam was performed according to the departmental dose-optimization program which includes automated exposure control, adjustment of the mA and/or kV according to patient size and/or use of iterative reconstruction technique. COMPARISON:  MRI head October 17, 2020. FINDINGS: Brain: No evidence of acute infarction, hemorrhage, hydrocephalus, extra-axial collection or mass lesion/mass effect. Patchy white matter hypodensities are nonspecific but compatible with chronic microvascular ischemic change. Small remote appearing cerebellar infarcts. Vascular: No hyperdense vessel identified. Skull: No acute fracture. Sinuses/Orbits: Clear sinuses.  No acute orbital findings. IMPRESSION: 1. No evidence of acute intracranial abnormality. 2. 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 Electronically Signed   By: Gilmore GORMAN Molt M.D.   On: 02/01/2024 14:09    Assessment/Plan Assessment and Plan    Dizziness and nausea Resolved  Dizziness and nausea experienced on September 25, leading to hospitalization until September 27. Symptoms included severe dizziness, nausea, and near syncope. Initial workup showed low sodium levels, but other labs were normal. CT scan showed no acute abnormalities but indicated remote cerebral infarcts. - Recheck blood work today to monitor sodium levels and kidney function. - Follow up with neurologist  - Continue current medications including Plavix  until Friday, then continue with baby aspirin .  Hx of TIA Remote cerebral infarcts CT scan during hospitalization revealed remote  cerebral infarcts, suggesting possible past TIAs. No acute infarcts were noted. - Follow up with neurologist  Mild aortic valve regurgitation New finding of mild aortic valve regurgitation on echocardiogram. Ejection fraction is normal at 60-65%. - Follow up with cardiologist.  Essential hypertension Chronic hypertension managed with current medications. Blood pressure at today's visit was 134/68, indicating good control. - Continue current antihypertensive medications: amlodipine  5 mg and losartan  100 mg daily.  Type 2 diabetes mellitus Type 2 diabetes managed with glipizide . Current A1c indicates prediabetes, showing improvement. - Continue glipizide  5 mg twice a day  CKD  - CR 1.28 at baseline  - Continue to avoid Nephrotoxins and dose all other medication for renal clearance  - Recheck BMP with GFR  Hyperlipidemia Hyperlipidemia managed with current medication regimen. - Continue rosuvastatin  5 mg three times a week.   Family/ staff Communication: Reviewed plan of care with patient verbalized understanding   Labs/tests ordered:  - CBC/diff  - BMP with GFR  Next Appointment: Return if symptoms worsen or fail to improve.   Total time: 30 minutes. Greater than 50% of total time spent doing patient education regarding T2DM,HTN, HLD,Hx of TIA,Mild Aortic valve regurgitation,CKD,health maintenance including symptom/medication management.   Roxan JAYSON Plough, NP

## 2024-02-20 LAB — BASIC METABOLIC PANEL WITH GFR
BUN/Creatinine Ratio: 15 (calc) (ref 6–22)
BUN: 21 mg/dL (ref 7–25)
CO2: 24 mmol/L (ref 20–32)
Calcium: 9.4 mg/dL (ref 8.6–10.4)
Chloride: 107 mmol/L (ref 98–110)
Creat: 1.43 mg/dL — ABNORMAL HIGH (ref 0.60–1.00)
Glucose, Bld: 155 mg/dL — ABNORMAL HIGH (ref 65–99)
Potassium: 4.1 mmol/L (ref 3.5–5.3)
Sodium: 140 mmol/L (ref 135–146)
eGFR: 38 mL/min/1.73m2 — ABNORMAL LOW (ref >=60)

## 2024-02-20 LAB — CBC WITH DIFFERENTIAL/PLATELET
Absolute Lymphocytes: 1499 {cells}/uL (ref 850–3900)
Absolute Monocytes: 233 {cells}/uL (ref 200–950)
Basophils Absolute: 52 {cells}/uL (ref 0–200)
Basophils Relative: 1.4 %
Eosinophils Absolute: 181 {cells}/uL (ref 15–500)
Eosinophils Relative: 4.9 %
HCT: 38.3 % (ref 35.0–45.0)
Hemoglobin: 12.5 g/dL (ref 11.7–15.5)
MCH: 28.6 pg (ref 27.0–33.0)
MCHC: 32.6 g/dL (ref 32.0–36.0)
MCV: 87.6 fL (ref 80.0–100.0)
MPV: 10.2 fL (ref 7.5–12.5)
Monocytes Relative: 6.3 %
Neutro Abs: 1735 {cells}/uL (ref 1500–7800)
Neutrophils Relative %: 46.9 %
Platelets: 296 Thousand/uL (ref 140–400)
RBC: 4.37 Million/uL (ref 3.80–5.10)
RDW: 12.9 % (ref 11.0–15.0)
Total Lymphocyte: 40.5 %
WBC: 3.7 Thousand/uL — ABNORMAL LOW (ref 3.8–10.8)

## 2024-02-21 ENCOUNTER — Ambulatory Visit
Admission: RE | Admit: 2024-02-21 | Discharge: 2024-02-21 | Disposition: A | Discharge status: Home / Self Care | Source: Ambulatory Visit | Attending: Nurse Practitioner | Admitting: Nurse Practitioner

## 2024-02-21 ENCOUNTER — Other Ambulatory Visit: Payer: Self-pay | Admitting: Cardiology

## 2024-02-21 DIAGNOSIS — Z1231 Encounter for screening mammogram for malignant neoplasm of breast: Secondary | ICD-10-CM | POA: Diagnosis not present

## 2024-02-21 DIAGNOSIS — R072 Precordial pain: Secondary | ICD-10-CM

## 2024-02-23 ENCOUNTER — Ambulatory Visit (HOSPITAL_COMMUNITY)

## 2024-02-23 ENCOUNTER — Ambulatory Visit: Payer: Self-pay | Admitting: Cardiology

## 2024-02-23 ENCOUNTER — Ambulatory Visit (HOSPITAL_COMMUNITY)
Admission: RE | Admit: 2024-02-23 | Discharge: 2024-02-23 | Disposition: A | Source: Ambulatory Visit | Attending: Cardiology

## 2024-02-23 ENCOUNTER — Ambulatory Visit: Payer: Self-pay | Admitting: Family

## 2024-02-23 DIAGNOSIS — I2510 Atherosclerotic heart disease of native coronary artery without angina pectoris: Secondary | ICD-10-CM | POA: Diagnosis not present

## 2024-02-23 DIAGNOSIS — R072 Precordial pain: Secondary | ICD-10-CM | POA: Diagnosis not present

## 2024-02-23 DIAGNOSIS — I700 Atherosclerosis of aorta: Secondary | ICD-10-CM | POA: Diagnosis not present

## 2024-02-23 LAB — MYOCARDIAL PERFUSION IMAGING
LV dias vol: 72 mL (ref 46–106)
LV sys vol: 14 mL (ref 3.8–5.2)
Nuc Stress EF: 81 %
Peak HR: 102 {beats}/min
Rest HR: 73 {beats}/min
Rest Nuclear Isotope Dose: 10.8 mCi
SDS: 1
SRS: 4
SSS: 5
ST Depression (mm): 0 mm
Stress Nuclear Isotope Dose: 23.6 mCi
TID: 0.87

## 2024-02-23 MED ORDER — TECHNETIUM TC 99M TETROFOSMIN IV KIT
32.6000 | PACK | Freq: Once | INTRAVENOUS | Status: AC | PRN
  Administered 2024-02-23: 32.6 via INTRAVENOUS

## 2024-02-23 MED ORDER — TECHNETIUM TC 99M TETROFOSMIN IV KIT
10.8000 | PACK | Freq: Once | INTRAVENOUS | Status: AC | PRN
  Administered 2024-02-23: 10.8 via INTRAVENOUS

## 2024-02-23 MED ORDER — REGADENOSON 0.4 MG/5ML IV SOLN
INTRAVENOUS | Status: AC
  Filled 2024-02-23: qty 5

## 2024-02-23 MED ORDER — REGADENOSON 0.4 MG/5ML IV SOLN
0.4000 mg | Freq: Once | INTRAVENOUS | Status: AC
  Administered 2024-02-23: 0.4 mg via INTRAVENOUS

## 2024-02-25 ENCOUNTER — Other Ambulatory Visit: Payer: Self-pay | Admitting: Nurse Practitioner

## 2024-02-25 DIAGNOSIS — K219 Gastro-esophageal reflux disease without esophagitis: Secondary | ICD-10-CM

## 2024-02-27 ENCOUNTER — Other Ambulatory Visit: Payer: Self-pay | Admitting: Nurse Practitioner

## 2024-02-27 NOTE — Telephone Encounter (Signed)
 Noted

## 2024-02-27 NOTE — Telephone Encounter (Signed)
 She is on plavix  and that has an interaction with omeprazole . We can try a different medication for acid reflux but not omeprazole .

## 2024-02-27 NOTE — Telephone Encounter (Signed)
 Yes that would work if she wants to discuss, can be virtual- if she's agreeable to try another medication for acid reflux can just send one into pharmacy and follow up with her if needed.

## 2024-02-27 NOTE — Telephone Encounter (Signed)
 Contacted patient to see if she was still taking the Paxil  medication due to it stating that it was discontinued. Patient confirmed she was taking medication. Have sent Paxil  30mg  to pharmacy.  Patient stated that a Ozell America MD discontinued her omeprazole  20mg  and was put on Pantoprazole  40mg  by Royal Capri, patient stated that the pantoprazole  doesn't work for her and would like to be put on omeprazole  20mg  again which was prescribed by [name], NP. Medication is pended. Informed patient I would let her pcp know for further review.

## 2024-02-27 NOTE — Telephone Encounter (Signed)
 Did you want me to schedule her an appointment for you all to discuss >

## 2024-02-28 MED ORDER — OMEPRAZOLE 20 MG PO CPDR: 20.0000 mg | DELAYED_RELEASE_CAPSULE | Freq: Every day | ORAL | 3 refills | Status: AC

## 2024-02-28 NOTE — Telephone Encounter (Signed)
 Contacted patient she stated that she is no longer on plavix  that was only for 3 weeks, she said if you could let her know.  Thanks call ended. Routing to patients pcp.

## 2024-02-28 NOTE — Telephone Encounter (Signed)
 Refill sent.

## 2024-02-28 NOTE — Addendum Note (Signed)
 Addended by: Zanetta Dehaan K on: 02/28/2024 03:27 PM   Modules accepted: Orders

## 2024-03-07 ENCOUNTER — Encounter: Payer: Self-pay | Admitting: Neurology

## 2024-03-07 ENCOUNTER — Ambulatory Visit (INDEPENDENT_AMBULATORY_CARE_PROVIDER_SITE_OTHER): Admitting: Neurology

## 2024-03-07 VITALS — BP 133/75 | Ht 64.0 in | Wt 208.0 lb

## 2024-03-07 DIAGNOSIS — R42 Dizziness and giddiness: Secondary | ICD-10-CM | POA: Diagnosis not present

## 2024-03-07 DIAGNOSIS — I639 Cerebral infarction, unspecified: Secondary | ICD-10-CM | POA: Diagnosis not present

## 2024-03-07 NOTE — Progress Notes (Signed)
 Patient: Jill Garza Date of Birth: [birth year]  Reason for Visit: Stroke Clinic Visit  History from: Patient Primary Neurologist: Rosemarie  ASSESSMENT AND PLAN 73 y.o. year old female small subacute left frontal lobe infarct (vs artifact).  Presented with dizziness and diaphoresis (MRI would not explain these symptoms).  Posterior TIA is a more likely etiology.  MRI of the brain showed small chronic infarcts within the right corona radiata and bilateral cerebellar hemispheres new from MRI in 2022.  Vascular risk factors: HTN, HLD, Type 2 DM.  - No residual deficits - 30-day cardiac monitor to evaluate for A-fib - Continue aspirin  81 mg daily for secondary stroke prevention - Stay active, exercise - Strict management of vascular risk factors with a goal BP less than 130/90, A1c less than 7.0, LDL less than 70 for secondary stroke prevention - Continue follow-up with primary care, return here as needed  HISTORY OF PRESENT ILLNESS: Today 03/07/24 Here today alone. Remains on aspirin  81 mg daily. Mentions after hospital visit, some trouble getting words out. Recalls day of event, suddenly got dizzy, gait was off, got diaphoretic, nauseated, had to lie down. Called her sister, told her the speech was off. Husband took her to hospital. Speech resolved. Did cardiac monitor in May 2025, for 13 days, stopped early. SR and ST, 39 brief SVT episodes. Cardiology did nuclear stress test, no concerns.  Doing well, no issues or concerns. BP 133/75.  HISTORY  Admitted for episode of dizziness and diaphoresis.  Similar episode a week prior lasting 15 minutes.  MRI showed small acute infarct in left frontal lobe which does not explain her symptoms.  Posterior TIA is more likely.  NIH 0.  - CT head no acute abnormality - CTA head and neck no LVO or significant stenosis.  Diffuse tortuosity of major arterial vasculature suggesting chronic underlying hypertension - MRI of the brain subacute infarct  within left frontal lobe white matter, moderate white matter chronic small vessel ischemic disease.  Small chronic infarcts within the right corona radiata and bilateral cerebellar hemispheres, new from MRI in 2022. - 2D echo EF 60 to 65% - EEG normal - LDL 51 - A1c 6.1 - No antithrombotic prior to arrival, DAPT aspirin  81 and Plavix  75 for 3 weeks then aspirin  alone  REVIEW OF SYSTEMS: Out of a complete 14 system review of symptoms, the patient complains only of the following symptoms, and all other reviewed systems are negative.  See HPI  ALLERGIES: Allergies  Allergen Reactions   Penicillins     Rash    Ivp Dye [Iodinated Contrast Media]     Hives    Percocet [Oxycodone -Acetaminophen ]     Itching     HOME MEDICATIONS: Outpatient Medications Prior to Visit  Medication Sig Dispense Refill   amLODipine  (NORVASC ) 5 MG tablet Take 1 tablet by mouth once daily 90 tablet 1   aspirin  EC 81 MG tablet Take 1 tablet (81 mg total) by mouth daily. Swallow whole. 30 tablet 12   benzonatate  (TESSALON ) 200 MG capsule Take 1 capsule (200 mg total) by mouth 3 (three) times daily as needed for cough. 40 capsule 2   diltiazem  (CARDIZEM ) 30 MG tablet Take 1 tablet (30 mg total) by mouth 3 (three) times daily as needed. 90 tablet 3   fenofibrate  (TRICOR ) 145 MG tablet TAKE ONE TABLET BY MOUTH ONCE A DAY 90 tablet 1   glipiZIDE  (GLUCOTROL ) 5 MG tablet TAKE 1 TABLET BY MOUTH TWICE DAILY BEFORE MEAL(S) 180  tablet 1   losartan  (COZAAR ) 100 MG tablet Take 1 tablet by mouth once daily 90 tablet 1   omeprazole  (PRILOSEC) 20 MG capsule Take 1 capsule (20 mg total) by mouth daily. 30 capsule 3   PARoxetine  (PAXIL ) 30 MG tablet Take 1 tablet by mouth once daily 90 tablet 0   rosuvastatin  (CRESTOR ) 5 MG tablet Take 1 tablet (5 mg total) by mouth 3 (three) times a week. 45 tablet 1   triamcinolone cream (KENALOG) 0.1 % Apply 1 Application topically as directed.     No facility-administered medications prior to  visit.    PAST MEDICAL HISTORY: Past Medical History:  Diagnosis Date   Arthritis    Cancer (HCC)    melanoma- right leg   Chronic kidney disease    Depression    GERD (gastroesophageal reflux disease)    pt denies    History of hiatal hernia    Hypercholesterolemia    Hyperparathyroidism    Hypertension    Sweat, sweating, excessive    Type 2 diabetes mellitus (HCC)     PAST SURGICAL HISTORY: Past Surgical History:  Procedure Laterality Date   ABDOMINAL HYSTERECTOMY  1979   APPENDECTOMY  1964   BREAST EXCISIONAL BIOPSY Left 08/2014   benign   BREAST LUMPECTOMY WITH RADIOACTIVE SEED LOCALIZATION Left 12/31/2014   Procedure: LEFT BREAST LUMPECTOMY WITH RADIOACTIVE SEED LOCALIZATION;  Surgeon: Donnice Lima, MD;  Location: [facility];  Service: General;  Laterality: Left;   CARPAL TUNNEL RELEASE Right 1977   CARPAL TUNNEL RELEASE Left 2007   CATARACT EXTRACTION Left 04/13/2014   Dr. Cleatus   CATARACT EXTRACTION Right 06/10/2014   Dr. Cleatus   CHOLECYSTECTOMY N/A 07/14/2017   Procedure: LAPAROSCOPIC CHOLECYSTECTOMY;  Surgeon: Teresa Lonni HERO, MD;  Location: [facility];  Service: General;  Laterality: N/A;   COLONOSCOPY  2009   JOINT REPLACEMENT     MELANOMA EXCISION     PARATHYROIDECTOMY N/A 09/10/2020   Procedure: PARATHYROIDECTOMY;  Surgeon: Eletha Boas, MD;  Location: [facility];  Service: General;  Laterality: N/A;   THYROID  EXPLORATION N/A 09/10/2020   Procedure: POSSIBLE NECK EXPLORATION;  Surgeon: Eletha Boas, MD;  Location: [facility];  Service: General;  Laterality: N/A;   TONSILLECTOMY  1962   TOTAL KNEE ARTHROPLASTY Left 2012   TOTAL KNEE ARTHROPLASTY Right 2008    FAMILY HISTORY: Family History  Problem Relation Age of Onset   Pulmonary embolism Mother        Multiple, B/L    Heart attack Father        Died of MI age 40   Hypertension Sister    Diabetes type II Sister    High Cholesterol Sister    Deep vein thrombosis Sister    Reye's  syndrome Daughter    Cancer Maternal Grandmother 56   Hypertension Brother    Heart disease Brother 40   Diabetes Brother    High Cholesterol Brother    Hypertension Brother    High Cholesterol Brother    Alcohol abuse Brother    Deep vein thrombosis Brother    Atrial fibrillation Brother    Hepatitis Brother    Stroke Other    Colon cancer Neg Hx    Esophageal cancer Neg Hx    Stomach cancer Neg Hx    Rectal cancer Neg Hx    Breast cancer Neg Hx     SOCIAL HISTORY: Social History   Socioeconomic History   Marital status: Married  Spouse name: Not on file   Number of children: Not on file   Years of education: Not on file   Highest education level: Master's degree (e.g., MA, MS, MEng, MEd, MSW, MBA)  Occupational History   Not on file  Tobacco Use   Smoking status: Former    Current packs/day: 0.00    Average packs/day: 1 pack/day for 15.0 years (15.0 ttl pk-yrs)    Types: Cigarettes    Start date: 05/10/1971    Quit date: 05/09/1986    Years since quitting: 37.8   Smokeless tobacco: Never  Vaping Use   Vaping status: Never Used  Substance and Sexual Activity   Alcohol use: Yes    Alcohol/week: 0.0 standard drinks of alcohol    Comment: rare   Drug use: No   Sexual activity: Yes  Other Topics Concern   Not on file  Social History Narrative   Not on file   Social Drivers of Health   Financial Resource Strain: Low Risk  (12/14/2023)   Overall Financial Resource Strain (CARDIA)    Difficulty of Paying Living Expenses: Not hard at all  Food Insecurity: No Food Insecurity (02/05/2024)   Hunger Vital Sign    Worried About Running Out of Food in the Last Year: Never true    Ran Out of Food in the Last Year: Never true  Transportation Needs: No Transportation Needs (02/05/2024)   PRAPARE - Administrator, Civil Service (Medical): No    Lack of Transportation (Non-Medical): No  Physical Activity: Insufficiently Active (12/14/2023)   Exercise Vital Sign     Days of Exercise per Week: 2 days    Minutes of Exercise per Session: 10 min  Stress: Stress Concern Present (12/14/2023)   [employer] of Occupational Health - Occupational Stress Questionnaire    Feeling of Stress: To some extent  Social Connections: Moderately Isolated (02/02/2024)   Social Connection and Isolation Panel    Frequency of Communication with Friends and Family: More than three times a week    Frequency of Social Gatherings with Friends and Family: More than three times a week    Attends Religious Services: Never    Database Administrator or Organizations: No    Attends Banker Meetings: Never    Marital Status: Married  Catering Manager Violence: Not At Risk (02/05/2024)   Humiliation, Afraid, Rape, and Kick questionnaire    Fear of Current or Ex-Partner: No    Emotionally Abused: No    Physically Abused: No    Sexually Abused: No    PHYSICAL EXAM  Vitals:   03/07/24 1007  BP: 133/75  Weight: 208 lb (94.3 kg)  Height: 5' 4 (1.626 m)   Body mass index is 35.7 kg/m.  Generalized: Well developed, in no acute distress  Neurological examination  Mentation: Alert oriented to time, place, history taking. Follows all commands speech and language fluent Cranial nerve II-XII: Pupils were equal round reactive to light. Extraocular movements were full, visual field were full on confrontational test. Facial sensation and strength were normal.  Head turning and shoulder shrug  were normal and symmetric. Motor: The motor testing reveals 5 over 5 strength of all 4 extremities. Good symmetric motor tone is noted throughout.  Sensory: Sensory testing is intact to soft touch on all 4 extremities. No evidence of extinction is noted.  Coordination: Cerebellar testing reveals good finger-nose-finger and heel-to-shin bilaterally.  Gait and station: Gait is normal. Tandem gait is unsteady  and flip-flops. Reflexes: Deep tendon reflexes are symmetric and normal  bilaterally.   DIAGNOSTIC DATA (LABS, IMAGING, TESTING) - I reviewed patient records, labs, notes, testing and imaging myself where available.  Lab Results  Component Value Date   WBC 3.7 (L) 02/19/2024   HGB 12.5 02/19/2024   HCT 38.3 02/19/2024   MCV 87.6 02/19/2024   PLT 296 02/19/2024      Component Value Date/Time   NA 140 02/19/2024 1456   NA 142 09/05/2023 0000   K 4.1 02/19/2024 1456   CL 107 02/19/2024 1456   CO2 24 02/19/2024 1456   GLUCOSE 155 (H) 02/19/2024 1456   BUN 21 02/19/2024 1456   BUN 24 (A) 09/05/2023 0000   CREATININE 1.43 (H) 02/19/2024 1456   CALCIUM  9.4 02/19/2024 1456   PROT 6.9 02/02/2024 0543   PROT 7.2 09/02/2015 0846   ALBUMIN 3.8 02/02/2024 0543   ALBUMIN 4.3 09/02/2015 0846   AST 20 02/02/2024 0543   ALT 17 02/02/2024 0543   ALKPHOS 31 (L) 02/02/2024 0543   BILITOT 0.7 02/02/2024 0543   BILITOT 0.2 09/02/2015 0846   GFRNONAA 44 (L) 02/02/2024 0543   GFRNONAA 41 (L) 07/24/2020 1519   GFRAA 48 (L) 07/24/2020 1519   Lab Results  Component Value Date   CHOL 105 02/02/2024   HDL 43 02/02/2024   LDLCALC 51 02/02/2024   TRIG 56 02/02/2024   CHOLHDL 2.4 02/02/2024   Lab Results  Component Value Date   HGBA1C 6.1 (H) 02/02/2024   No results found for: [id number] Lab Results  Component Value Date   TSH 1.92 08/15/2023    Lauraine Born, AGNP-C, DNP 03/07/2024, 10:39 AM [facility] [address] [place], [place] [address] [phone]

## 2024-03-07 NOTE — Progress Notes (Signed)
 I agree with the above plan

## 2024-03-07 NOTE — Patient Instructions (Signed)
 Great to see you today! 30-day cardiac monitor to evaluate for A-fib Continue aspirin  81 mg daily for secondary stroke prevention Stay active, exercise Strict management of vascular risk factors with a goal BP less than 130/90, A1c less than 7.0, LDL less than 70 for secondary stroke prevention Keep close follow-up with primary care, return here as needed.  Thanks!!

## 2024-03-13 ENCOUNTER — Other Ambulatory Visit: Payer: Self-pay | Admitting: Nurse Practitioner

## 2024-03-13 DIAGNOSIS — I129 Hypertensive chronic kidney disease with stage 1 through stage 4 chronic kidney disease, or unspecified chronic kidney disease: Secondary | ICD-10-CM | POA: Diagnosis not present

## 2024-03-13 DIAGNOSIS — I10 Essential (primary) hypertension: Secondary | ICD-10-CM

## 2024-03-13 DIAGNOSIS — I639 Cerebral infarction, unspecified: Secondary | ICD-10-CM | POA: Diagnosis not present

## 2024-03-13 DIAGNOSIS — E213 Hyperparathyroidism, unspecified: Secondary | ICD-10-CM | POA: Diagnosis not present

## 2024-03-13 DIAGNOSIS — N1832 Chronic kidney disease, stage 3b: Secondary | ICD-10-CM | POA: Diagnosis not present

## 2024-03-13 DIAGNOSIS — E1122 Type 2 diabetes mellitus with diabetic chronic kidney disease: Secondary | ICD-10-CM | POA: Diagnosis not present

## 2024-03-13 DIAGNOSIS — E785 Hyperlipidemia, unspecified: Secondary | ICD-10-CM | POA: Diagnosis not present

## 2024-03-16 DIAGNOSIS — I639 Cerebral infarction, unspecified: Secondary | ICD-10-CM | POA: Diagnosis not present

## 2024-03-23 ENCOUNTER — Other Ambulatory Visit: Payer: Self-pay | Admitting: Nurse Practitioner

## 2024-03-27 DIAGNOSIS — N1832 Chronic kidney disease, stage 3b: Secondary | ICD-10-CM | POA: Diagnosis not present

## 2024-04-01 ENCOUNTER — Other Ambulatory Visit (HOSPITAL_COMMUNITY): Payer: Self-pay

## 2024-04-15 DIAGNOSIS — H18523 Epithelial (juvenile) corneal dystrophy, bilateral: Secondary | ICD-10-CM | POA: Diagnosis not present

## 2024-04-15 DIAGNOSIS — E119 Type 2 diabetes mellitus without complications: Secondary | ICD-10-CM | POA: Diagnosis not present

## 2024-04-17 ENCOUNTER — Ambulatory Visit: Attending: Neurology

## 2024-04-18 DIAGNOSIS — I639 Cerebral infarction, unspecified: Secondary | ICD-10-CM

## 2024-04-23 ENCOUNTER — Ambulatory Visit: Payer: Self-pay | Admitting: Neurology

## 2024-06-12 ENCOUNTER — Other Ambulatory Visit: Payer: Self-pay

## 2024-06-12 DIAGNOSIS — E1140 Type 2 diabetes mellitus with diabetic neuropathy, unspecified: Secondary | ICD-10-CM

## 2024-06-12 DIAGNOSIS — N1832 Chronic kidney disease, stage 3b: Secondary | ICD-10-CM

## 2024-06-13 LAB — HEMOGLOBIN A1C
Hgb A1c MFr Bld: 6.6 % — ABNORMAL HIGH
Mean Plasma Glucose: 143 mg/dL
eAG (mmol/L): 7.9 mmol/L

## 2024-06-13 LAB — CBC WITH DIFFERENTIAL/PLATELET
Absolute Lymphocytes: 2047 {cells}/uL (ref 850–3900)
Absolute Monocytes: 270 {cells}/uL (ref 200–950)
Basophils Absolute: 38 {cells}/uL (ref 0–200)
Basophils Relative: 0.7 %
Eosinophils Absolute: 189 {cells}/uL (ref 15–500)
Eosinophils Relative: 3.5 %
HCT: 43.8 % (ref 35.9–46.0)
Hemoglobin: 14.1 g/dL (ref 11.7–15.5)
MCH: 28.8 pg (ref 27.0–33.0)
MCHC: 32.2 g/dL (ref 31.6–35.4)
MCV: 89.4 fL (ref 81.4–101.7)
MPV: 10.4 fL (ref 7.5–12.5)
Monocytes Relative: 5 %
Neutro Abs: 2857 {cells}/uL (ref 1500–7800)
Neutrophils Relative %: 52.9 %
Platelets: 310 10*3/uL (ref 140–400)
RBC: 4.9 Million/uL (ref 3.80–5.10)
RDW: 12.3 % (ref 11.0–15.0)
Total Lymphocyte: 37.9 %
WBC: 5.4 10*3/uL (ref 3.8–10.8)

## 2024-06-13 LAB — LIPID PANEL
Cholesterol: 119 mg/dL
HDL: 55 mg/dL
LDL Cholesterol (Calc): 45 mg/dL
Non-HDL Cholesterol (Calc): 64 mg/dL
Total CHOL/HDL Ratio: 2.2 (calc)
Triglycerides: 105 mg/dL

## 2024-06-17 ENCOUNTER — Ambulatory Visit: Payer: Self-pay | Admitting: Nurse Practitioner

## 2024-06-18 ENCOUNTER — Encounter: Payer: Medicare Other | Admitting: Nurse Practitioner
